# Patient Record
Sex: Female | Born: 1945
Health system: Southern US, Community
[De-identification: ages and names within clinical notes are randomized; demographics above are authoritative.]

## PROBLEM LIST (undated history)

## (undated) DIAGNOSIS — I272 Pulmonary hypertension, unspecified: Secondary | ICD-10-CM

## (undated) DIAGNOSIS — J449 Chronic obstructive pulmonary disease, unspecified: Secondary | ICD-10-CM

## (undated) DIAGNOSIS — B9620 Unspecified Escherichia coli [E. coli] as the cause of diseases classified elsewhere: Secondary | ICD-10-CM

## (undated) DIAGNOSIS — N12 Tubulo-interstitial nephritis, not specified as acute or chronic: Secondary | ICD-10-CM

## (undated) DIAGNOSIS — A419 Sepsis, unspecified organism: Secondary | ICD-10-CM

## (undated) DIAGNOSIS — F32A Depression, unspecified: Secondary | ICD-10-CM

## (undated) DIAGNOSIS — J189 Pneumonia, unspecified organism: Secondary | ICD-10-CM

## (undated) DIAGNOSIS — F329 Major depressive disorder, single episode, unspecified: Secondary | ICD-10-CM

## (undated) DIAGNOSIS — M1990 Unspecified osteoarthritis, unspecified site: Secondary | ICD-10-CM

## (undated) DIAGNOSIS — K219 Gastro-esophageal reflux disease without esophagitis: Secondary | ICD-10-CM

## (undated) HISTORY — PX: ELBOW SURGERY: SHX618

## (undated) HISTORY — PX: BACK SURGERY: SHX140

## (undated) HISTORY — PX: ABDOMINAL HYSTERECTOMY: SHX81

---

## 2000-11-27 ENCOUNTER — Encounter: Payer: Self-pay | Admitting: *Deleted

## 2000-11-27 ENCOUNTER — Ambulatory Visit (HOSPITAL_COMMUNITY): Admission: RE | Admit: 2000-11-27 | Discharge: 2000-11-27 | Payer: Self-pay | Admitting: *Deleted

## 2000-12-06 ENCOUNTER — Encounter: Payer: Self-pay | Admitting: *Deleted

## 2000-12-06 ENCOUNTER — Other Ambulatory Visit: Admission: RE | Admit: 2000-12-06 | Discharge: 2000-12-06 | Payer: Self-pay | Admitting: *Deleted

## 2000-12-06 ENCOUNTER — Ambulatory Visit (HOSPITAL_COMMUNITY): Admission: RE | Admit: 2000-12-06 | Discharge: 2000-12-06 | Payer: Self-pay | Admitting: *Deleted

## 2002-10-01 ENCOUNTER — Ambulatory Visit (HOSPITAL_COMMUNITY): Admission: RE | Admit: 2002-10-01 | Discharge: 2002-10-01 | Payer: Self-pay | Admitting: *Deleted

## 2002-10-01 ENCOUNTER — Encounter (INDEPENDENT_AMBULATORY_CARE_PROVIDER_SITE_OTHER): Payer: Self-pay | Admitting: *Deleted

## 2002-11-10 ENCOUNTER — Ambulatory Visit (HOSPITAL_COMMUNITY): Admission: EM | Admit: 2002-11-10 | Discharge: 2002-11-11 | Payer: Self-pay | Admitting: Emergency Medicine

## 2010-01-07 ENCOUNTER — Ambulatory Visit: Payer: Self-pay | Admitting: Internal Medicine

## 2010-01-07 DIAGNOSIS — K219 Gastro-esophageal reflux disease without esophagitis: Secondary | ICD-10-CM | POA: Insufficient documentation

## 2010-01-07 DIAGNOSIS — M545 Low back pain, unspecified: Secondary | ICD-10-CM | POA: Insufficient documentation

## 2010-01-07 DIAGNOSIS — G5620 Lesion of ulnar nerve, unspecified upper limb: Secondary | ICD-10-CM | POA: Insufficient documentation

## 2010-01-07 DIAGNOSIS — Z860100 Personal history of colon polyps, unspecified: Secondary | ICD-10-CM | POA: Insufficient documentation

## 2010-01-07 DIAGNOSIS — F17200 Nicotine dependence, unspecified, uncomplicated: Secondary | ICD-10-CM | POA: Insufficient documentation

## 2010-01-27 ENCOUNTER — Encounter: Payer: Self-pay | Admitting: Internal Medicine

## 2010-02-03 ENCOUNTER — Telehealth: Payer: Self-pay | Admitting: Internal Medicine

## 2010-03-05 ENCOUNTER — Emergency Department (HOSPITAL_COMMUNITY): Admission: EM | Admit: 2010-03-05 | Discharge: 2010-03-05 | Payer: Self-pay | Admitting: Emergency Medicine

## 2010-03-28 ENCOUNTER — Encounter (HOSPITAL_COMMUNITY): Admission: RE | Admit: 2010-03-28 | Discharge: 2010-04-27 | Payer: Self-pay | Admitting: Orthopedic Surgery

## 2010-08-16 NOTE — Progress Notes (Signed)
     Follow-up for Phone Call       Follow-up by: Etta Grandchild MD,  February 03, 2010 9:25 AM

## 2010-08-16 NOTE — Assessment & Plan Note (Signed)
Summary: NEW [employer] L HAND RING/SMALL FINGERS -NUMBNESS-#-PKG/OFF-STC   Vital Signs:  Patient profile:   65 year old female Height:      68 inches Weight:      178.25 pounds BMI:     27.20 O2 Sat:      94 % on Room air Temp:     98.1 degrees F oral Pulse rate:   90 / minute Pulse rhythm:   regular Resp:     16 per minute BP sitting:   110 / 68  (left arm) Cuff size:   large  Vitals Entered By: Rock Nephew CMA (January 07, 2010 1:13 PM)  Nutrition Counseling: Patient's BMI is greater than 25 and therefore counseled on weight management options.  O2 Flow:  Room air CC: Pt wants to establish a PCP and discuss L side finger numbness and tingling Is Patient Diabetic? No Pain Assessment Patient in pain? no        Primary Care Provider:  Etta Grandchild MD  CC:  Pt wants to establish a PCP and discuss L side finger numbness and tingling.  History of Present Illness: New to me she complains of the gradual onset of numbness and tingling in her left hand only in the 4th and 5th fingers. She has repetitive activity with sewing but no trauma or injury. The symptoms are most severe at night.  Preventive Screening-Counseling & Management  Alcohol-Tobacco     Smoking Status: current     Smoking Cessation Counseling: yes     Smoke Cessation Stage: precontemplative     Packs/Day: 1.0     Year Started: 1970     Pack years: 40     Tobacco Counseling: to quit use of tobacco products  Caffeine-Diet-Exercise     Does Patient Exercise: no  Hep-HIV-STD-Contraception     Hepatitis Risk: no risk noted     HIV Risk: no risk noted     STD Risk: no risk noted  Safety-Violence-Falls     Seat Belt Use: yes     Helmet Use: yes     Firearms in the Home: no firearms in the home     Smoke Detectors: yes     Violence in the Home: no risk noted     Sexual Abuse: no      Sexual History:  currently monogamous.        Drug Use:  never and no.        Blood Transfusions:  no.    Current  Medications (verified): 1)  None  Allergies (verified): No Known Drug Allergies  Past History:  Past Medical History: Colonic polyps, hx of GERD Low back pain  Past Surgical History: Hysterectomy Lumbar laminectomy  Family History: Family History Diabetes 1st degree relative  Social History: Retired Married Current Smoker Alcohol use-no Drug use-no Regular exercise-no Smoking Status:  current Drug Use:  never, no Does Patient Exercise:  no Packs/Day:  1.0 Hepatitis Risk:  no risk noted HIV Risk:  no risk noted STD Risk:  no risk noted Seat Belt Use:  yes Sexual History:  currently monogamous Blood Transfusions:  no  Review of Systems  The patient denies anorexia, fever, weight loss, chest pain, syncope, dyspnea on exertion, peripheral edema, prolonged cough, headaches, hemoptysis, abdominal pain, hematuria, suspicious skin lesions, transient blindness, and enlarged lymph nodes.    Physical Exam  General:  alert, well-developed, well-nourished, and well-hydrated.   Head:  normocephalic, atraumatic, no abnormalities observed, and no abnormalities palpated.  Eyes:  vision grossly intact, pupils equal, pupils round, no injection, and no nystagmus.   Mouth:  Oral mucosa and oropharynx without lesions or exudates.  Teeth in good repair. Neck:  supple, full ROM, no masses, no thyromegaly, no thyroid nodules or tenderness, no JVD, normal carotid upstroke, no carotid bruits, no cervical lymphadenopathy, and no neck tenderness.   Lungs:  normal respiratory effort, no intercostal retractions, no accessory muscle use, normal breath sounds, no dullness, no fremitus, no crackles, and no wheezes.   Heart:  normal rate, regular rhythm, no murmur, no gallop, no rub, and no JVD.   Abdomen:  soft, non-tender, normal bowel sounds, no distention, no masses, no guarding, no rigidity, no rebound tenderness, no abdominal hernia, no inguinal hernia, no hepatomegaly, and no splenomegaly.     Msk:  normal ROM, no joint tenderness, no joint swelling, no joint warmth, no redness over joints, no joint deformities, no joint instability, no crepitation, and no muscle atrophy.  negative Phalen's and Tinel's test bilaterally. Pulses:  R and L carotid,radial,femoral,dorsalis pedis and posterior tibial pulses are full and equal bilaterally Extremities:  No clubbing, cyanosis, edema, or deformity noted with normal full range of motion of all joints.   Neurologic:  alert & oriented X3, cranial nerves II-XII intact, strength normal in all extremities, gait normal, DTRs symmetrical and normal, finger-to-nose normal, heel-to-shin normal, and LUE sensory loss.     Impression & Recommendations:  Problem # 1:  ULNAR NEUROPATHY, LEFT (ICD-354.2) Assessment New  Orders: Neurology Referral (Neuro) Splints- All Types (Z3086)  Problem # 2:  TOBACCO USE (ICD-305.1) Assessment: New  Encouraged smoking cessation and discussed different methods for smoking cessation.   Patient Instructions: 1)  Please schedule a follow-up appointment in 1 month. 2)  Tobacco is very bad for your health and your loved ones! You Should stop smoking!. 3)  Stop Smoking Tips: Choose a Quit date. Cut down before the Quit date. decide what you will do as a substitute when you feel the urge to smoke(gum,toothpick,exercise).

## 2010-12-02 NOTE — H&P (Signed)
NAME:  CANDISE, CRABTREE                            ACCOUNT NO.:  [id number]   MEDICAL RECORD NO.:  [id number]                   PATIENT TYPE:  INP   LOCATION:  1829                                 FACILITY:  [facility]   PHYSICIAN:  Meade Maw, M.D.                 DATE OF BIRTH:  06-11-1946   DATE OF ADMISSION:  11/10/2002  DATE OF DISCHARGE:                                HISTORY & PHYSICAL   PRIMARY CARE Wiley Flicker:  Duncan Dull, M.D.   IMPRESSION:  (As dictated by Meade Maw, M.D.)  1. Chest pain with typical/atypical features in a 65 year old obese female     with a history of tobacco abuse and unknown cholesterol profile.  No     change in discomfort after three sublingual sprays.  Relieved by IV     morphine.  Her EKG is nonischemic and unchanged from baseline.  The first     set of cardiac enzymes were negative.  A recent pharmacologic stress test     was negative for ischemia.  The patient was pain-free in the emergency     room, now with mild recurrence.  2. History of premature ventricular contractions.  3. Tobacco abuse.  4. Mild hypokalemia which was supplemented.   PLAN:  (As dictated by Meade Maw, M.D.)  1. Cardiac catheterization and possible percutaneous intervention if     indicated and able.  The risks, potential complications, benefits, and     alternatives to the procedure were discussed in detail.  The patient, her     husband, and her sons indicate that their questions have been     satisfactory addressed and agree to proceed.  2. Serial cardiac enzymes and daily EKG.  3. IV nitroglycerin and IV heparin.  Continue aspirin and beta blocker.  4. Tobacco cessation consult.   HISTORY OF PRESENT ILLNESS:  The patient is a pleasant 65 year old with a  history of dyspnea, PVCs, and tobacco abuse.  She is status post recent  cardiology evaluation and subsequent pharmacologic stress test which was  negative for ischemia with normal ejection fraction.   This  morning this patient awoke feeling malaise and mild right lower sternal  chest hurting.  She felt nauseous all morning.  The discomfort intensified  by 11:30 a.m. to an 8/10 on pain scale associated with shortness of breath,  diaphoresis, and some radiation to her right neck.  EMS was summoned and  they administered three sublingual nitroglycerin sprays without change in  discomfort.  IV morphine with relief.  In the ER, the patient had mild  recurrent discomfort.  The EKG was nonischemic.  The first set of cardiac  enzymes was negative.  She feels slightly better if sitting up.  There was  mild reproduction of discomfort to applied pressure.  No change with deep  breath or cough.   PREVIOUS MEDICAL HISTORY:  1. Palpitations; history of documented PVCs for which she started Toprol     approximately three days earlier.  2. History of dyspnea.     a. Adenosine Cardiolite in April of 2004 was negative for ischemia.  EF        of 64%.  3. Tobacco abuse.  4. Anxiety with depression, improved on Zoloft.  5. History of fibromyalgia.  6. History of plantar fasciitis.  7. GERD on Protonix, well controlled.   Denies history of stroke, diabetes mellitus, and thyroid disease.   PREVIOUS SURGICAL HISTORY:  1. Hysterectomy without BSO.  2. Back surgery in 1989.  3. Repair of rupture at L4-5.   SOCIAL HISTORY AND HABITS:  Tobacco use of one pack per day for 36 years.  ETOH negative.  The patient has been married for 24 years.  She has two sons  alive and well.  She works as a Associate Professor.   FAMILY HISTORY:  Mom died at age 51.  She had diabetes and congestive heart  failure.  Her father died at age 79 of lung disease and blood disease.  Her grandfather had a late in life MI.  She has two sisters and one brother,  alive and well.   ALLERGIES:  SULFA causing nausea.  She is okay with seafood, shellfish, and  iodinated products.   MEDICATIONS:  1. Protonix 40 mg p.o. daily.  2. Aspirin 81  mg p.o. daily.  3. Multivitamin per day.  4. Calcium supplements.  5. Tylenol P.M. p.r.n.  6. Zoloft one-quarter of a 50 mg tablet p.o. daily.  7. Toprol XL 50 mg p.o. daily.  8. Volteran 75 mg p.r.n.  She did take a dose before admission.   REVIEW OF SYSTEMS:  As in HPI/previous medical history.  She wears eye  glasses.  Good dentition.  Hearing okay.  Negative dysphagia to food or  fluid.  History of GERD well controlled on Protonix.  More of a burning and  sour taste in the mouth.  Arthritis in her fingers and hips.  She has  general muscular aches and pains.  Denies pedal edema nor orthopnea.  Some  episodic lower extremity swelling after being on her feet all day.   PHYSICAL EXAMINATION:  (As performed by Meade Maw, M.D.)  VITAL SIGNS:  The blood pressure is 120/63, heart rate 74 and regular,  respiratory rate 22, O2 saturation 94% on room air, and she is afebrile.  GENERAL APPEARANCE:  She is a well-nourished, pleasantly conversant, 55-year-  old female looking older than her stated age.  Her husband and two sons are  in attendance.  NECK:  There are bilateral carotid upstrokes without bruit.  No significant  JVD.  No thyromegaly.  CHEST:  Bibasilar soft crackles.  CARDIAC:  Regular rate and rhythm without murmur, rub, or gallop.  Normal S1  and S2.  ABDOMEN:  Obese.  Normoactive bowel sounds.  Negative bruits.  Nontender to  applied pressure.  No organomegaly appreciated.  EXTREMITIES:  Distal pulses intact.  Negative pedal edema.  NEUROLOGIC:  Grossly nonfocal.  Alert and oriented x 3.    LABORATORY TESTS AND DATA:  Pro time 12.7, INR 0.9, PTT 29.  Sodium 140, K  3.4, chloride 108, BUN 14, creatinine 0.9, glucose 92.  Hemoglobin 16,  hematocrit 48.  LFTs within normal range.  The EKG revealed NSR at 65 beats  per minute, T-wave abnormalities in aVL, and no ischemic changes.  The chest x-ray is pending.  The first CK was 150 with an MB of 3.5 and troponin I  less than  0.01.     Salomon Fick, N.P.                       Meade Maw, M.D.    MES/MEDQ  D:  11/10/2002  T:  11/10/2002  Job:  [id number]   cc:   Duncan Dull, M.D.  [address]  [place]  [place] [address]  Fax: [phone]

## 2010-12-02 NOTE — Cardiovascular Report (Signed)
NAME:  Kayla Sawyer, Kayla Sawyer                            ACCOUNT NO.:  [id number]   MEDICAL RECORD NO.:  [id number]                   PATIENT TYPE:  INP   LOCATION:  4731                                 FACILITY:  [facility]   PHYSICIAN:  Meade Maw, M.D.                 DATE OF BIRTH:  03/13/1946   DATE OF PROCEDURE:  DATE OF DISCHARGE:  11/11/2002                              CARDIAC CATHETERIZATION   INDICATIONS FOR PROCEDURE:  Ongoing chest pain in a 65 year old female with  significant risk factors for coronary artery disease. Recent pharmacological  Cardiolite was negative for inducible ischemia; however, the patient has had  ongoing severe chest pain requiring ER visits.   PROCEDURE:  After obtaining written and informed consent, the patient was  brought to the cardiac catheterization lab in a __________ state,  preoperative sedation was achieved using IV Versed. The right groin was  prepped and draped in the usual sterile fashion. Local anesthesia was  achieved using 1% Xylocaine. A 6-French hemostasis sheath was placed into  the right femoral artery using a modified Seldinger's technique. Selective  coronary angiography was performed using a JL4 JR4 Judkins catheter.  Multiple views were obtained. Catheter exchange was made over her guidewire.  Single plane ventriculogram was performed in the RAO position using a 6-  French pigtail curved catheter. Following the procedure, there was no  identifiable disease. The patient was transferred to the holding area.  Hemostasis was achieved using digital pressure. There was no early  complications.   FINDINGS:  The aortic pressure was 103/54, LV pressure was 98/3. There was  no gradient noted on pullback. Single plane ventriculogram reveals mild  hypokinesis with estimated ejection fraction of 50 to 55%.   Coronary angiography:  Left main coronary artery is a short artery with  bifurcation to the left anterior descending and a circumflex  vessel. There  is no significant disease in the left main coronary artery.   Left anterior descending:  Left anterior descending gives rise to a small  D1, larger D2, small D3, goes on to end as an apical branch. There is no  significant disease in the left anterior descending or its branches.   Circumflex vessel. Circumflex vessel is a moderate sized vessel given rise  to a trivial OM1, moderate OM2, large OM3, and then goes on to end as an AV  groove vessel. There is no disease in the circumflex or its branches.   Right coronary artery. The right coronary artery is dominant for the  posterior circulation and gives rise to RV marginals, PDA and PL branch.  There is disease in the right coronary artery or its branches.   FINAL IMPRESSION:  1. Luminal irregularities only.  2. Normal ventriculogram, ejection fraction is 50 to 55% with mitral     regurgitation.   RECOMMENDATIONS:  Consider other etiologies such as esophageal spasm or  gastritis  for ongoing chest pain. Gastrointestinal workup as recommended, if  this has not been previously performed.                                               Meade Maw, M.D.    HP/MEDQ  D:  11/11/2002  T:  11/12/2002  Job:  [id number]

## 2010-12-09 ENCOUNTER — Other Ambulatory Visit (HOSPITAL_COMMUNITY): Payer: Self-pay | Admitting: Internal Medicine

## 2010-12-09 DIAGNOSIS — R0989 Other specified symptoms and signs involving the circulatory and respiratory systems: Secondary | ICD-10-CM

## 2010-12-15 ENCOUNTER — Ambulatory Visit (HOSPITAL_COMMUNITY)
Admission: RE | Admit: 2010-12-15 | Discharge: 2010-12-15 | Disposition: A | Discharge status: Home / Self Care | Payer: BC Managed Care – PPO | Source: Ambulatory Visit | Attending: Internal Medicine | Admitting: Internal Medicine

## 2010-12-15 DIAGNOSIS — I6529 Occlusion and stenosis of unspecified carotid artery: Secondary | ICD-10-CM | POA: Insufficient documentation

## 2010-12-15 DIAGNOSIS — R0989 Other specified symptoms and signs involving the circulatory and respiratory systems: Secondary | ICD-10-CM | POA: Insufficient documentation

## 2010-12-15 DIAGNOSIS — F17200 Nicotine dependence, unspecified, uncomplicated: Secondary | ICD-10-CM | POA: Insufficient documentation

## 2011-07-07 ENCOUNTER — Emergency Department (HOSPITAL_COMMUNITY): Payer: Self-pay

## 2011-07-07 ENCOUNTER — Encounter: Payer: Self-pay | Admitting: Emergency Medicine

## 2011-07-07 ENCOUNTER — Other Ambulatory Visit: Payer: Self-pay

## 2011-07-07 ENCOUNTER — Inpatient Hospital Stay (HOSPITAL_COMMUNITY)
Admission: EM | Admit: 2011-07-07 | Discharge: 2011-07-10 | DRG: 192 | Disposition: A | Discharge status: Home / Self Care | Payer: Self-pay | Attending: Internal Medicine | Admitting: Internal Medicine

## 2011-07-07 DIAGNOSIS — J449 Chronic obstructive pulmonary disease, unspecified: Secondary | ICD-10-CM

## 2011-07-07 DIAGNOSIS — F17200 Nicotine dependence, unspecified, uncomplicated: Secondary | ICD-10-CM | POA: Diagnosis present

## 2011-07-07 DIAGNOSIS — J392 Other diseases of pharynx: Secondary | ICD-10-CM | POA: Diagnosis present

## 2011-07-07 DIAGNOSIS — J441 Chronic obstructive pulmonary disease with (acute) exacerbation: Principal | ICD-10-CM | POA: Diagnosis present

## 2011-07-07 DIAGNOSIS — R51 Headache: Secondary | ICD-10-CM | POA: Diagnosis present

## 2011-07-07 DIAGNOSIS — Z23 Encounter for immunization: Secondary | ICD-10-CM

## 2011-07-07 DIAGNOSIS — R0902 Hypoxemia: Secondary | ICD-10-CM | POA: Diagnosis present

## 2011-07-07 DIAGNOSIS — I4949 Other premature depolarization: Secondary | ICD-10-CM | POA: Diagnosis present

## 2011-07-07 LAB — CARDIAC PANEL(CRET KIN+CKTOT+MB+TROPI)
CK, MB: 4 ng/mL (ref 0.3–4.0)
Relative Index: 3.2 — ABNORMAL HIGH (ref 0.0–2.5)
Total CK: 126 U/L (ref 7–177)
Troponin I: <0.3 ng/mL (ref <0.30)

## 2011-07-07 LAB — POCT I-STAT, CHEM 8
BUN: 11 mg/dL (ref 6–23)
Calcium, Ion: 1.11 mmol/L — ABNORMAL LOW (ref 1.12–1.32)
Chloride: 99 mEq/L (ref 96–112)
Creatinine, Ser: 0.7 mg/dL (ref 0.50–1.10)
Glucose, Bld: 115 mg/dL — ABNORMAL HIGH (ref 70–99)
HCT: 47 % — ABNORMAL HIGH (ref 36.0–46.0)
Hemoglobin: 16 g/dL — ABNORMAL HIGH (ref 12.0–15.0)
Potassium: 3.5 mEq/L (ref 3.5–5.1)
Sodium: 137 mEq/L (ref 135–145)
TCO2: 26 mmol/L (ref 0–100)

## 2011-07-07 LAB — INFLUENZA PANEL BY PCR (TYPE A & B)
H1N1 flu by pcr: NOT DETECTED
Influenza A By PCR: NEGATIVE
Influenza B By PCR: NEGATIVE

## 2011-07-07 MED ORDER — HEPARIN SODIUM (PORCINE) 5000 UNIT/ML IJ SOLN
5000.0000 [IU] | Freq: Three times a day (TID) | INTRAMUSCULAR | Status: DC
  Administered 2011-07-07 – 2011-07-10 (×9): 5000 [IU] via SUBCUTANEOUS
  Filled 2011-07-07 (×15): qty 1

## 2011-07-07 MED ORDER — GADOBENATE DIMEGLUMINE 529 MG/ML IV SOLN
15.0000 mL | Freq: Once | INTRAVENOUS | Status: AC | PRN
  Administered 2011-07-07: 15 mL via INTRAVENOUS

## 2011-07-07 MED ORDER — IPRATROPIUM BROMIDE 0.02 % IN SOLN
0.5000 mg | RESPIRATORY_TRACT | Status: DC | PRN
  Administered 2011-07-07: 0.5 mg via RESPIRATORY_TRACT
  Filled 2011-07-07: qty 2.5

## 2011-07-07 MED ORDER — HYDROMORPHONE HCL PF 1 MG/ML IJ SOLN
1.0000 mg | INTRAMUSCULAR | Status: DC | PRN
  Filled 2011-07-07: qty 1

## 2011-07-07 MED ORDER — ALBUTEROL SULFATE (5 MG/ML) 0.5% IN NEBU
2.5000 mg | INHALATION_SOLUTION | Freq: Once | RESPIRATORY_TRACT | Status: AC
  Administered 2011-07-07: 2.5 mg via RESPIRATORY_TRACT
  Filled 2011-07-07: qty 0.5

## 2011-07-07 MED ORDER — IPRATROPIUM BROMIDE 0.02 % IN SOLN
0.5000 mg | Freq: Once | RESPIRATORY_TRACT | Status: AC
  Administered 2011-07-07: 0.5 mg via RESPIRATORY_TRACT
  Filled 2011-07-07: qty 2.5

## 2011-07-07 MED ORDER — ALBUTEROL SULFATE (5 MG/ML) 0.5% IN NEBU
2.5000 mg | INHALATION_SOLUTION | Freq: Three times a day (TID) | RESPIRATORY_TRACT | Status: DC
  Administered 2011-07-08 – 2011-07-10 (×8): 2.5 mg via RESPIRATORY_TRACT
  Filled 2011-07-07 (×8): qty 0.5

## 2011-07-07 MED ORDER — IOHEXOL 300 MG/ML  SOLN
80.0000 mL | Freq: Once | INTRAMUSCULAR | Status: AC | PRN
  Administered 2011-07-07: 80 mL via INTRAVENOUS

## 2011-07-07 MED ORDER — HYDROMORPHONE HCL PF 1 MG/ML IJ SOLN
1.0000 mg | Freq: Once | INTRAMUSCULAR | Status: AC
  Administered 2011-07-07: 1 mg via INTRAVENOUS
  Filled 2011-07-07: qty 1

## 2011-07-07 MED ORDER — HYDROMORPHONE HCL PF 1 MG/ML IJ SOLN
1.0000 mg | INTRAMUSCULAR | Status: DC | PRN
  Administered 2011-07-07: 1 mg via INTRAVENOUS
  Filled 2011-07-07: qty 1

## 2011-07-07 MED ORDER — ALBUTEROL SULFATE (5 MG/ML) 0.5% IN NEBU
5.0000 mg | INHALATION_SOLUTION | Freq: Four times a day (QID) | RESPIRATORY_TRACT | Status: DC
  Administered 2011-07-07: 5 mg via RESPIRATORY_TRACT
  Filled 2011-07-07 (×2): qty 0.5
  Filled 2011-07-07: qty 1

## 2011-07-07 MED ORDER — IPRATROPIUM BROMIDE 0.02 % IN SOLN
0.5000 mg | Freq: Three times a day (TID) | RESPIRATORY_TRACT | Status: DC
  Administered 2011-07-08 – 2011-07-10 (×9): 0.5 mg via RESPIRATORY_TRACT
  Filled 2011-07-07 (×8): qty 2.5

## 2011-07-07 MED ORDER — PREDNISONE 20 MG PO TABS
60.0000 mg | ORAL_TABLET | Freq: Once | ORAL | Status: AC
  Administered 2011-07-07: 60 mg via ORAL
  Filled 2011-07-07: qty 3

## 2011-07-07 MED ORDER — PNEUMOCOCCAL VAC POLYVALENT 25 MCG/0.5ML IJ INJ
0.5000 mL | INJECTION | INTRAMUSCULAR | Status: AC
  Administered 2011-07-08: 0.5 mL via INTRAMUSCULAR
  Filled 2011-07-07: qty 0.5

## 2011-07-07 MED ORDER — IBUPROFEN 400 MG PO TABS: 200.0000 mg | ORAL_TABLET | Freq: Four times a day (QID) | ORAL | Status: DC | PRN

## 2011-07-07 MED ORDER — ONDANSETRON HCL 4 MG/2ML IJ SOLN
4.0000 mg | Freq: Three times a day (TID) | INTRAMUSCULAR | Status: DC | PRN
  Administered 2011-07-07: 4 mg via INTRAVENOUS
  Filled 2011-07-07: qty 2

## 2011-07-07 MED ORDER — METHYLPREDNISOLONE SODIUM SUCC 125 MG IJ SOLR
60.0000 mg | Freq: Four times a day (QID) | INTRAMUSCULAR | Status: DC
  Administered 2011-07-07 – 2011-07-10 (×11): 60 mg via INTRAVENOUS
  Filled 2011-07-07 (×11): qty 2

## 2011-07-07 MED ORDER — MOXIFLOXACIN HCL IN NACL 400 MG/250ML IV SOLN
400.0000 mg | INTRAVENOUS | Status: DC
  Administered 2011-07-07 – 2011-07-09 (×3): 400 mg via INTRAVENOUS
  Filled 2011-07-07 (×5): qty 250

## 2011-07-07 MED ORDER — OXYCODONE-ACETAMINOPHEN 5-325 MG PO TABS
1.0000 | ORAL_TABLET | ORAL | Status: DC | PRN
  Administered 2011-07-07: 1 via ORAL
  Filled 2011-07-07: qty 1

## 2011-07-07 MED ORDER — KETOROLAC TROMETHAMINE 30 MG/ML IJ SOLN
30.0000 mg | Freq: Once | INTRAMUSCULAR | Status: AC
  Administered 2011-07-07: 30 mg via INTRAVENOUS
  Filled 2011-07-07: qty 1

## 2011-07-07 MED ORDER — ONDANSETRON HCL 4 MG/2ML IJ SOLN
4.0000 mg | Freq: Once | INTRAMUSCULAR | Status: AC
  Administered 2011-07-07: 4 mg via INTRAVENOUS
  Filled 2011-07-07: qty 2

## 2011-07-07 MED ORDER — INFLUENZA VIRUS VACC SPLIT PF IM SUSP
0.5000 mL | INTRAMUSCULAR | Status: AC
  Administered 2011-07-08: 0.5 mL via INTRAMUSCULAR
  Filled 2011-07-07: qty 0.5

## 2011-07-07 MED ORDER — ALBUTEROL SULFATE (5 MG/ML) 0.5% IN NEBU: 2.5000 mg | INHALATION_SOLUTION | Freq: Four times a day (QID) | RESPIRATORY_TRACT | Status: DC

## 2011-07-07 MED ORDER — ALBUTEROL SULFATE (5 MG/ML) 0.5% IN NEBU: 2.5000 mg | INHALATION_SOLUTION | RESPIRATORY_TRACT | Status: DC | PRN

## 2011-07-07 NOTE — ED Notes (Signed)
Pt ambulated around nurses station o2 sat dropped to 86% and EDP notified.

## 2011-07-07 NOTE — ED Provider Notes (Signed)
History   This chart was scribed for Nelia Shi, MD scribed by Magnus Sinning. The patient was seen in room APA01/APA01    CSN: [id number]  Arrival date & time 07/07/11  2956   First MD Initiated Contact with Patient 07/07/11 1057      Chief Complaint  Patient presents with  . Headache  . Diarrhea  . Shortness of Breath    (Consider location/radiation/quality/duration/timing/severity/associated sxs/prior treatment) HPI Kayla Sawyer is a 65 y.o. female who presents to the Emergency Department complaining of an intermittent sharp head pain, with onset being two days ago w/ associated dyspnea, cough, congestion,sneezing, mild diarrhea, fever, and inability to eat. She describes her CC as a "shooting pain located behind left ear" and reports that she would "not have come to the ED if she was not having this head pain." She denies vomiting and reports that it is not worsened with movement. Pt states that she took two ibuprofen this morning with no improvement. Pt reports having a hx of migraines and her last physical in May.  PCP: Dr. Margo Aye History reviewed. No pertinent past medical history.  Past Surgical History  Procedure Date  . Elbow surgery     History reviewed. No pertinent family history.  History  Substance Use Topics  . Smoking status: Current Everyday Smoker -- 1.0 packs/day    Types: Cigarettes  . Smokeless tobacco: Not on file  . Alcohol Use: No    Review of Systems  Constitutional: Positive for fever and appetite change (Inability to eat).  HENT: Positive for congestion and sneezing.   Respiratory: Positive for cough.        Difficulty breathing  Gastrointestinal: Positive for diarrhea. Negative for vomiting.  Neurological: Positive for headaches.  All other systems reviewed and are negative.    Allergies  Review of patient's allergies indicates no known allergies.  Home Medications   Current Outpatient Rx  Name Route Sig Dispense Refill  .  IBUPROFEN 200 MG PO TABS Oral Take 200 mg by mouth every 6 (six) hours as needed. For pain       BP 117/88  Pulse 106  Temp(Src) 98.3 F (36.8 C) (Oral)  Resp 22  Ht 5\' 9"  (1.753 m)  Wt 170 lb (77.111 kg)  BMI 25.10 kg/m2  SpO2 96%  Physical Exam  Nursing note and vitals reviewed. Constitutional: She is oriented to person, place, and time. She appears well-developed and well-nourished. No distress.  HENT:  Head: Normocephalic and atraumatic.  Eyes: Pupils are equal, round, and reactive to light.  Neck: Normal range of motion.       Point tenderness on the trapezius on the left side  Cardiovascular: Normal rate, regular rhythm, normal heart sounds and intact distal pulses.   Pulmonary/Chest:       Course rhonchi bilaterally   Abdominal: Normal appearance. She exhibits no distension.  Musculoskeletal: Normal range of motion.  Neurological: She is alert and oriented to person, place, and time. No cranial nerve deficit.  Skin: Skin is warm and dry. No rash noted.  Psychiatric: She has a normal mood and affect. Her behavior is normal.    ED Course  Procedures (including critical care time) DIAGNOSTIC STUDIES: Oxygen Saturation is 89% on room air, low by my interpretation.   Patient's condition continued to show that she was hypoxic on room air and would desaturate when we take her off the oxygen.  I elected to admit the patient because of her persistent hypoxia.  The hospitalist was contacted and will come see the patient in the ED. COORDINATION OF CARE:  Labs Reviewed  POCT I-STAT, CHEM 8 - Abnormal; Notable for the following:    Glucose, Bld 115 (*)    Calcium, Ion 1.11 (*)    Hemoglobin 16.0 (*)    HCT 47.0 (*)    All other components within normal limits  I-STAT, CHEM 8  I-STAT TROPONIN I   Dg Chest 2 View  07/07/2011  *RADIOLOGY REPORT*  Clinical Data: Cough and congestion.  Headache  CHEST - 2 VIEW  Comparison: None.  Findings: COPD with pulmonary hyperinflation.   Negative for heart failure or pneumonia.  Possible 1 cm nodule right lung base.  This could be due to overlapping prominent lung markings or possibly a nipple shadow.  IMPRESSION: COPD with hyperinflation.  Possible right lower lobe nodule.  Repeat PA chest x-ray with nipple marker is suggested.  Original Report Authenticated By: Camelia Phenes, M.D.   Ct Head Wo Contrast  07/07/2011  *RADIOLOGY REPORT*  Clinical Data: Left-sided weakness.  CT HEAD WITHOUT CONTRAST  Technique:  Contiguous axial images were obtained from the base of the skull through the vertex without contrast.  Comparison: None  Findings: Minimal patchy chronic small vessel disease in the deep white matter. No acute intracranial abnormality.  Specifically, no hemorrhage, hydrocephalus, mass lesion, acute infarction, or significant intracranial injury.  No acute calvarial abnormality. Mucosal thickening in the ethmoid air cells.  Air-fluid level in the left sphenoid sinus.  Mastoids are clear.  IMPRESSION: Minimal chronic small vessel disease.  No acute intracranial abnormality.  Original Report Authenticated By: Cyndie Chime, M.D.   Ct Chest W Contrast  07/07/2011  *RADIOLOGY REPORT*  Clinical Data: Cough, congestion.  Evaluate nodule.  CT CHEST WITH CONTRAST  Technique:  Multidetector CT imaging of the chest was performed following the standard protocol during bolus administration of intravenous contrast.  Contrast: 80mL OMNIPAQUE IOHEXOL 300 MG/ML IV SOLN  Comparison: Chest x-ray 07/07/2011  Findings: There are no suspicious pulmonary nodules within the lungs, in particular in the right lower lobe as questioned on chest x-ray.  This likely represented overlapping shadows.  Mild COPD changes are present.  There is mild peribronchial thickening. Scarring in the right lung base.  No confluent opacities or effusions.  Heart is upper limits normal in size.  Coronary artery and aortic calcifications.  No aortic aneurysm.  There are mildly  prominent mediastinal lymph nodes.  Index right paratracheal node measures 11 mm in short axis diameter. Subcarinal lymph node has a short axis diameter of 9 mm.  No hilar or axillary adenopathy.  Esophagus is fluid-filled which may be related to dysmotility or reflux. Visualized thyroid and chest wall soft tissues unremarkable. Imaging into the upper abdomen shows no acute findings.  Proximal superior mesenteric artery is severely calcified and possibly occluded or nearly occluded.  Visualized upper abdomen demonstrates irregular plaque along the posterior wall with diffuse heavy calcifications.  IMPRESSION: Moderate COPD.  No suspicious pulmonary nodules in the lungs.  Previously questioned density on chest x-ray likely represented overlapping shadows.  Coronary artery disease.  Borderline sized mediastinal lymph nodes.  Fluid-filled esophagus which may be related to dysmotility or reflux.  Heavily calcified upper abdominal aorta and branch vessels. Superior mesenteric artery is heavily calcified and may be occluded or nearly occluded proximally.  Original Report Authenticated By: Cyndie Chime, M.D.   Dg Chest Special View  07/07/2011  *RADIOLOGY REPORT*  Clinical  Data: Question right lower lobe nodule  CHEST SPECIAL VIEW  Comparison: Same day  Findings: Film with nipple markers shows that the nipple shadows are not responsible for the nodular density in the right lower lobe.  Heart size is normal.  Hilar shadows are prominent, presumably due to prominent central pulmonary arteries.  No evidence of acute heart failure or effusion.  No acute bony finding.  Elsewhere, chronic interstitial lung disease and bronchial thickening remain evident.  IMPRESSION: Density in the right lower lobe is not due to a nipple shadow.  Chronic appearing interstitial lung markings.  Bronchial thickening suggesting bronchitis.  If the clinical presentation is consistent with bronchitis, this nodule could be reevaluated after  treatment.  Alternatively, one could perform chest CT today. Which  ever is chosen, I would definitely suggest some sort of follow-up.  Original Report Authenticated By: Thomasenia Sales, M.D.     1. Hypoxemia   2. COPD (chronic obstructive pulmonary disease)   3. Headache       MDM        I personally performed the services described in this documentation, which was scribed in my presence. The recorded information has been reviewed and considered.   Nelia Shi, MD 07/07/11 [phone]

## 2011-07-07 NOTE — H&P (Signed)
PCP:   Sanda Linger, MD, MD   Chief Complaint:  Pain behind my left ear for one day HPI: 65 year old female with history of ongoing tobacco abuse, copd not on home oxygen , she was on her regular state of health when last night start to have sudden onset of sever pain behind her left ear , no neck stiffness , no ear pain ,no loss of hearing, her condition associated with runny nose and congestion, cough non productive , patient denies any similar condition, no vomiting ,no nausea ,no chills or fever , on the ER has ct head, which was negative, also found to be hypoxic to low 80th , although no significant history of shortness of breath , denies any numbness or weakness of her upper or lower extrenities  Review of Systems:  The patient denies anorexia, fever, weight loss,, vision loss, decreased hearing, hoarseness, chest pain, syncope, dyspnea on exertion, peripheral edema, balance deficits, hemoptysis, abdominal pain, melena, hematochezia, severe indigestion/heartburn, hematuria, incontinence, genital sores, muscle weakness, suspicious skin lesions, transient blindness, difficulty walking, depression, unusual weight change, abnormal bleeding, enlarged lymph nodes, angioedema, and breast masses.  Past Medical History: History reviewed. No pertinent past medical history. Past Surgical History  Procedure Date  . Elbow surgery   Back surgery  Medications: Prior to Admission medications   Medication Sig Start Date End Date Taking? Authorizing Provider  ibuprofen (ADVIL,MOTRIN) 200 MG tablet Take 200 mg by mouth every 6 (six) hours as needed. For pain    Yes Historical Provider, MD    Allergies:  No Known Allergies  Social History:  reports that she has been smoking Cigarettes.  She has been smoking about 1 pack per day. She does not have any smokeless tobacco history on file. She reports that she does not drink alcohol or use illicit drugs. she is married ,lives with her husband,has 2 sons  grown  Family History: History reviewed. No pertinent family history.  Physical Exam: Filed Vitals:   07/07/11 1227 07/07/11 1230 07/07/11 1435 07/07/11 1547  BP:    94/58  Pulse:  86 94 108  Temp: 99.1 F (37.3 C)   99.2 F (37.3 C)  TempSrc: Oral   Oral  Resp:   18   Height:      Weight:      SpO2:  94% 87% 82%   She is not on respiratory distress, she is not pale or jaundice; skull with no mass or erythema, she has tenderness at mastoid bone but no palpable mass, neck no stiffness, no LN, NO THYROMEGALY Heart: S1 and s2 , no added sounds Abdomen soft non tender, bowel sound present Extremities with no edema CNS fully oriented , no focal neurological deficit  Labs on Admission:   Basename 07/07/11 1133  NA 137  K 3.5  CL 99  CO2 --  GLUCOSE 115*  BUN 11  CREATININE 0.70  CALCIUM --  MG --  PHOS --   No results found for this basename: AST:2,ALT:2,ALKPHOS:2,BILITOT:2,PROT:2,ALBUMIN:2 in the last 72 hours No results found for this basename: LIPASE:2,AMYLASE:2 in the last 72 hours  Basename 07/07/11 1133  WBC --  NEUTROABS --  HGB 16.0*  HCT 47.0*  MCV --  PLT --   No results found for this basename: CKTOTAL:3,CKMB:3,CKMBINDEX:3,TROPONINI:3 in the last 72 hours No results found for this basename: TSH,T4TOTAL,FREET3,T3FREE,THYROIDAB in the last 72 hours No results found for this basename: VITAMINB12:2,FOLATE:2,FERRITIN:2,TIBC:2,IRON:2,RETICCTPCT:2 in the last 72 hours  Radiological Exams on Admission: Dg Chest  2 View  07/07/2011  *RADIOLOGY REPORT*  Clinical Data: Cough and congestion.  Headache  CHEST - 2 VIEW  Comparison: None.  Findings: COPD with pulmonary hyperinflation.  Negative for heart failure or pneumonia.  Possible 1 cm nodule right lung base.  This could be due to overlapping prominent lung markings or possibly a nipple shadow.  IMPRESSION: COPD with hyperinflation.  Possible right lower lobe nodule.  Repeat PA chest x-ray with nipple marker is  suggested.  Original Report Authenticated By: Camelia Phenes, M.D.   Ct Head Wo Contrast  07/07/2011  *RADIOLOGY REPORT*  Clinical Data: Left-sided weakness.  CT HEAD WITHOUT CONTRAST  Technique:  Contiguous axial images were obtained from the base of the skull through the vertex without contrast.  Comparison: None  Findings: Minimal patchy chronic small vessel disease in the deep white matter. No acute intracranial abnormality.  Specifically, no hemorrhage, hydrocephalus, mass lesion, acute infarction, or significant intracranial injury.  No acute calvarial abnormality. Mucosal thickening in the ethmoid air cells.  Air-fluid level in the left sphenoid sinus.  Mastoids are clear.  IMPRESSION: Minimal chronic small vessel disease.  No acute intracranial abnormality.  Original Report Authenticated By: Cyndie Chime, M.D.   Ct Chest W Contrast  07/07/2011  *RADIOLOGY REPORT*  Clinical Data: Cough, congestion.  Evaluate nodule.  CT CHEST WITH CONTRAST  Technique:  Multidetector CT imaging of the chest was performed following the standard protocol during bolus administration of intravenous contrast.  Contrast: 80mL OMNIPAQUE IOHEXOL 300 MG/ML IV SOLN  Comparison: Chest x-ray 07/07/2011  Findings: There are no suspicious pulmonary nodules within the lungs, in particular in the right lower lobe as questioned on chest x-ray.  This likely represented overlapping shadows.  Mild COPD changes are present.  There is mild peribronchial thickening. Scarring in the right lung base.  No confluent opacities or effusions.  Heart is upper limits normal in size.  Coronary artery and aortic calcifications.  No aortic aneurysm.  There are mildly prominent mediastinal lymph nodes.  Index right paratracheal node measures 11 mm in short axis diameter. Subcarinal lymph node has a short axis diameter of 9 mm.  No hilar or axillary adenopathy.  Esophagus is fluid-filled which may be related to dysmotility or reflux. Visualized thyroid  and chest wall soft tissues unremarkable. Imaging into the upper abdomen shows no acute findings.  Proximal superior mesenteric artery is severely calcified and possibly occluded or nearly occluded.  Visualized upper abdomen demonstrates irregular plaque along the posterior wall with diffuse heavy calcifications.  IMPRESSION: Moderate COPD.  No suspicious pulmonary nodules in the lungs.  Previously questioned density on chest x-ray likely represented overlapping shadows.  Coronary artery disease.  Borderline sized mediastinal lymph nodes.  Fluid-filled esophagus which may be related to dysmotility or reflux.  Heavily calcified upper abdominal aorta and branch vessels. Superior mesenteric artery is heavily calcified and may be occluded or nearly occluded proximally.  Original Report Authenticated By: Cyndie Chime, M.D.   Dg Chest Special View  07/07/2011  *RADIOLOGY REPORT*  Clinical Data: Question right lower lobe nodule  CHEST SPECIAL VIEW  Comparison: Same day  Findings: Film with nipple markers shows that the nipple shadows are not responsible for the nodular density in the right lower lobe.  Heart size is normal.  Hilar shadows are prominent, presumably due to prominent central pulmonary arteries.  No evidence of acute heart failure or effusion.  No acute bony finding.  Elsewhere, chronic interstitial lung disease and bronchial thickening  remain evident.  IMPRESSION: Density in the right lower lobe is not due to a nipple shadow.  Chronic appearing interstitial lung markings.  Bronchial thickening suggesting bronchitis.  If the clinical presentation is consistent with bronchitis, this nodule could be reevaluated after treatment.  Alternatively, one could perform chest CT today. Which  ever is chosen, I would definitely suggest some sort of follow-up.  Original Report Authenticated By: Thomasenia Sales, M.D.    Assessment/Plan 1- left side neck pain could be muscular or mastoiditis with  Will proceed with  MRIof brain and neck with contrast. Pain medication for the time being. 2-hypoxia , history of ongoing tobacco abuse, will check flu PCR, nebs treatment , Avalox , short term steroid. 3-copd seems stable 4-hypokalemia : replace Korrina Zern I. 07/07/2011, 4:37 PM

## 2011-07-07 NOTE — ED Notes (Signed)
Pt is in radiology area, informed family of intent to transfer pt to the floor upon her return to the tx room

## 2011-07-07 NOTE — ED Notes (Signed)
Pt c/o head pain that shoots up the back of her head. Pt c/o sob. Pt states it started out as a cold on Wed.

## 2011-07-07 NOTE — ED Notes (Signed)
Oxygen taken of pt at this time.

## 2011-07-08 LAB — CBC
HCT: 40.5 % (ref 36.0–46.0)
Hemoglobin: 13.7 g/dL (ref 12.0–15.0)
MCH: 31.3 pg (ref 26.0–34.0)
MCHC: 33.8 g/dL (ref 30.0–36.0)
MCV: 92.5 fL (ref 78.0–100.0)
Platelets: 173 10*3/uL (ref 150–400)
RBC: 4.38 MIL/uL (ref 3.87–5.11)
RDW: 13.8 % (ref 11.5–15.5)
WBC: 7.4 10*3/uL (ref 4.0–10.5)

## 2011-07-08 LAB — COMPREHENSIVE METABOLIC PANEL
ALT: 15 U/L (ref 0–35)
AST: 13 U/L (ref 0–37)
Albumin: 3 g/dL — ABNORMAL LOW (ref 3.5–5.2)
Alkaline Phosphatase: 58 U/L (ref 39–117)
BUN: 12 mg/dL (ref 6–23)
CO2: 28 mEq/L (ref 19–32)
Calcium: 9 mg/dL (ref 8.4–10.5)
Chloride: 100 mEq/L (ref 96–112)
Creatinine, Ser: 0.53 mg/dL (ref 0.50–1.10)
GFR calc Af Amer: >90 mL/min (ref >90)
GFR calc non Af Amer: >90 mL/min (ref >90)
Glucose, Bld: 151 mg/dL — ABNORMAL HIGH (ref 70–99)
Potassium: 4.1 mEq/L (ref 3.5–5.1)
Sodium: 136 mEq/L (ref 135–145)
Total Bilirubin: 0.2 mg/dL — ABNORMAL LOW (ref 0.3–1.2)
Total Protein: 6.6 g/dL (ref 6.0–8.3)

## 2011-07-08 LAB — POCT I-STAT TROPONIN I: Troponin i, poc: 0.01 ng/mL (ref 0.00–0.08)

## 2011-07-08 NOTE — Progress Notes (Signed)
Subjective: Left ear pain resolved, she still shortness of breath, denies any coughing, she still hypoxic, telemetry monitor with some bigeminy, denies any chest pain, no nausea or vomiting  Objective: Vital signs in last 24 hours: Filed Vitals:   07/08/11 1126 07/08/11 1134 07/08/11 1306 07/08/11 1407  BP:    120/58  Pulse:    87  Temp:    97.5 F (36.4 C)  TempSrc:      Resp:    20  Height:      Weight:      SpO2: 85% 94% 98% 90%   Weight change:   Intake/Output Summary (Last 24 hours) at 07/08/11 1427 Last data filed at 07/08/11 1300  Gross per 24 hour  Intake    610 ml  Output   2300 ml  Net  -1690 ml   Lying comfortably in bed, not on respiratory distress Teeth without cavities, mucosa moist Neck supple with no lymph nodes Heart S1 and S2 with no added sounds Lungs fine rales at the left lung bases no wheezing Extremities without edema Lab Results:  [facility] 07/08/11 0658 07/07/11 1133  NA 136 137  K 4.1 3.5  CL 100 99  CO2 28 --  GLUCOSE 151* 115*  BUN 12 11  CREATININE 0.53 0.70  CALCIUM 9.0 --  MG -- --  PHOS -- --    Basename 07/08/11 0658  AST 13  ALT 15  ALKPHOS 58  BILITOT 0.2*  PROT 6.6  ALBUMIN 3.0*   No results found for this basename: LIPASE:2,AMYLASE:2 in the last 72 hours  Basename 07/08/11 0658 07/07/11 1133  WBC 7.4 --  NEUTROABS -- --  HGB 13.7 16.0*  HCT 40.5 47.0*  MCV 92.5 --  PLT 173 --    Basename 07/07/11 1906  CKTOTAL 126  CKMB 4.0  CKMBINDEX --  TROPONINI <0.30   No components found with this basename: POCBNP:3 No results found for this basename: DDIMER:2 in the last 72 hours No results found for this basename: HGBA1C:2 in the last 72 hours No results found for this basename: CHOL:2,HDL:2,LDLCALC:2,TRIG:2,CHOLHDL:2,LDLDIRECT:2 in the last 72 hours No results found for this basename: TSH,T4TOTAL,FREET3,T3FREE,THYROIDAB in the last 72 hours No results found for this basename:  VITAMINB12:2,FOLATE:2,FERRITIN:2,TIBC:2,IRON:2,RETICCTPCT:2 in the last 72 hours  Micro Results: No results found for this or any previous visit (from the past 240 hour(s)).  Studies/Results: Dg Chest 2 View  07/07/2011  *RADIOLOGY REPORT*  Clinical Data: Cough and congestion.  Headache  CHEST - 2 VIEW  Comparison: None.  Findings: COPD with pulmonary hyperinflation.  Negative for heart failure or pneumonia.  Possible 1 cm nodule right lung base.  This could be due to overlapping prominent lung markings or possibly a nipple shadow.  IMPRESSION: COPD with hyperinflation.  Possible right lower lobe nodule.  Repeat PA chest x-ray with nipple marker is suggested.  Original Report Authenticated By: Camelia Phenes, M.D.   Ct Head Wo Contrast  07/07/2011  *RADIOLOGY REPORT*  Clinical Data: Left-sided weakness.  CT HEAD WITHOUT CONTRAST  Technique:  Contiguous axial images were obtained from the base of the skull through the vertex without contrast.  Comparison: None  Findings: Minimal patchy chronic small vessel disease in the deep white matter. No acute intracranial abnormality.  Specifically, no hemorrhage, hydrocephalus, mass lesion, acute infarction, or significant intracranial injury.  No acute calvarial abnormality. Mucosal thickening in the ethmoid air cells.  Air-fluid level in the left sphenoid sinus.  Mastoids are clear.  IMPRESSION: Minimal chronic  small vessel disease.  No acute intracranial abnormality.  Original Report Authenticated By: Cyndie Chime, M.D.   Ct Chest W Contrast  07/07/2011  *RADIOLOGY REPORT*  Clinical Data: Cough, congestion.  Evaluate nodule.  CT CHEST WITH CONTRAST  Technique:  Multidetector CT imaging of the chest was performed following the standard protocol during bolus administration of intravenous contrast.  Contrast: 80mL OMNIPAQUE IOHEXOL 300 MG/ML IV SOLN  Comparison: Chest x-ray 07/07/2011  Findings: There are no suspicious pulmonary nodules within the lungs, in  particular in the right lower lobe as questioned on chest x-ray.  This likely represented overlapping shadows.  Mild COPD changes are present.  There is mild peribronchial thickening. Scarring in the right lung base.  No confluent opacities or effusions.  Heart is upper limits normal in size.  Coronary artery and aortic calcifications.  No aortic aneurysm.  There are mildly prominent mediastinal lymph nodes.  Index right paratracheal node measures 11 mm in short axis diameter. Subcarinal lymph node has a short axis diameter of 9 mm.  No hilar or axillary adenopathy.  Esophagus is fluid-filled which may be related to dysmotility or reflux. Visualized thyroid and chest wall soft tissues unremarkable. Imaging into the upper abdomen shows no acute findings.  Proximal superior mesenteric artery is severely calcified and possibly occluded or nearly occluded.  Visualized upper abdomen demonstrates irregular plaque along the posterior wall with diffuse heavy calcifications.  IMPRESSION: Moderate COPD.  No suspicious pulmonary nodules in the lungs.  Previously questioned density on chest x-ray likely represented overlapping shadows.  Coronary artery disease.  Borderline sized mediastinal lymph nodes.  Fluid-filled esophagus which may be related to dysmotility or reflux.  Heavily calcified upper abdominal aorta and branch vessels. Superior mesenteric artery is heavily calcified and may be occluded or nearly occluded proximally.  Original Report Authenticated By: Cyndie Chime, M.D.   Mr Laqueta Jean ZO Contrast  07/07/2011  *RADIOLOGY REPORT*  Clinical Data: Headache.  Left-sided neck pain.  MRI HEAD WITHOUT AND WITH CONTRAST,MR NECK SOFT TISSUE ONLY WITHOUT AND WITH CONTRAST  Technique:  Multiplanar, multiecho pulse sequences of the brain and surrounding structures were obtained according to standard protocol without and with intravenous contrast,  Contrast: 15mL MULTIHANCE GADOBENATE DIMEGLUMINE 529 MG/ML IV SOLN   Comparison: 07/07/2011 head CT.  Findings: Motion degraded exam.  No acute infarct.  No intracranial hemorrhage.  No intracranial mass or abnormal enhancement.  Scattered nonspecific white matter type changes probably related to result of small vessel disease.  No hydrocephalus.  Major intracranial vascular structures are patent.  Mild to moderate paranasal sinus mucosal thickening.  Sella is partially empty.  Posterior-superior nasopharyngeal lobulated cystic appearing lesion greater on the left measuring up to 1.5 x 1.8 x 1.3 cm.  Although this may represent a complex Thornwaldt cyst, other masses not excluded.  Direct visualization may be considered.  Minimal partial opacification right mastoid air cells.  IMPRESSION: Motion degraded exam.  No acute infarct.  No intracranial mass or abnormal enhancement.  Scattered nonspecific white matter type changes probably related to result of small vessel disease.  Posterior-superior nasopharyngeal lobulated cystic appearing lesion greater on the left measuring up to 1.5 x 1.8 x 1.3 cm.  Although this may represent a complex Thornwaldt cyst, other masses not excluded.  Direct visualization may be considered.  Minimal partial opacification right mastoid air cells.  Mild to moderate paranasal sinus mucosal thickening.  MRI NECK WITHOUT AND WITH CONTRAST  Technique:  Multiplanar, multiecho pulse sequences of  the neck and surrounding structures were obtained without and with intravenous contrast.  Findings:  Secondary to the marked motion degradation, evaluation is limited.  Besides the posterior-superior nasopharyngeal septated cystic appearing lesion as discussed above, no other obvious neck mass or adenopathy is noted.  If further delineation is clinically desired, the patient may be better suited for CT imaging.  IMPRESSION: Secondary to the marked motion degradation, evaluation is limited. Besides the posterior-superior nasopharyngeal septated cystic appearing lesion as  discussed above, no other obvious neck mass or adenopathy is noted.  If further delineation is clinically desired, the patient may be better suited for CT imaging.  To be called to the floor by [facility] MR technologist.  Original Report Authenticated By: Fuller Canada, M.D.   Mr Neck Soft Tissue Only W Wo Contrast  07/07/2011  *RADIOLOGY REPORT*  Clinical Data: Headache.  Left-sided neck pain.  MRI HEAD WITHOUT AND WITH CONTRAST,MR NECK SOFT TISSUE ONLY WITHOUT AND WITH CONTRAST  Technique:  Multiplanar, multiecho pulse sequences of the brain and surrounding structures were obtained according to standard protocol without and with intravenous contrast,  Contrast: 15mL MULTIHANCE GADOBENATE DIMEGLUMINE 529 MG/ML IV SOLN  Comparison: 07/07/2011 head CT.  Findings: Motion degraded exam.  No acute infarct.  No intracranial hemorrhage.  No intracranial mass or abnormal enhancement.  Scattered nonspecific white matter type changes probably related to result of small vessel disease.  No hydrocephalus.  Major intracranial vascular structures are patent.  Mild to moderate paranasal sinus mucosal thickening.  Sella is partially empty.  Posterior-superior nasopharyngeal lobulated cystic appearing lesion greater on the left measuring up to 1.5 x 1.8 x 1.3 cm.  Although this may represent a complex Thornwaldt cyst, other masses not excluded.  Direct visualization may be considered.  Minimal partial opacification right mastoid air cells.  IMPRESSION: Motion degraded exam.  No acute infarct.  No intracranial mass or abnormal enhancement.  Scattered nonspecific white matter type changes probably related to result of small vessel disease.  Posterior-superior nasopharyngeal lobulated cystic appearing lesion greater on the left measuring up to 1.5 x 1.8 x 1.3 cm.  Although this may represent a complex Thornwaldt cyst, other masses not excluded.  Direct visualization may be considered.  Minimal partial opacification right mastoid air  cells.  Mild to moderate paranasal sinus mucosal thickening.  MRI NECK WITHOUT AND WITH CONTRAST  Technique:  Multiplanar, multiecho pulse sequences of the neck and surrounding structures were obtained without and with intravenous contrast.  Findings:  Secondary to the marked motion degradation, evaluation is limited.  Besides the posterior-superior nasopharyngeal septated cystic appearing lesion as discussed above, no other obvious neck mass or adenopathy is noted.  If further delineation is clinically desired, the patient may be better suited for CT imaging.  IMPRESSION: Secondary to the marked motion degradation, evaluation is limited. Besides the posterior-superior nasopharyngeal septated cystic appearing lesion as discussed above, no other obvious neck mass or adenopathy is noted.  If further delineation is clinically desired, the patient may be better suited for CT imaging.  To be called to the floor by [facility] MR technologist.  Original Report Authenticated By: Fuller Canada, M.D.   Dg Chest Special View  07/07/2011  *RADIOLOGY REPORT*  Clinical Data: Question right lower lobe nodule  CHEST SPECIAL VIEW  Comparison: Same day  Findings: Film with nipple markers shows that the nipple shadows are not responsible for the nodular density in the right lower lobe.  Heart size is normal.  Hilar shadows are  prominent, presumably due to prominent central pulmonary arteries.  No evidence of acute heart failure or effusion.  No acute bony finding.  Elsewhere, chronic interstitial lung disease and bronchial thickening remain evident.  IMPRESSION: Density in the right lower lobe is not due to a nipple shadow.  Chronic appearing interstitial lung markings.  Bronchial thickening suggesting bronchitis.  If the clinical presentation is consistent with bronchitis, this nodule could be reevaluated after treatment.  Alternatively, one could perform chest CT today. Which  ever is chosen, I would definitely suggest some sort of  follow-up.  Original Report Authenticated By: Thomasenia Sales, M.D.    Medications: I have reviewed the patient's current medications. Scheduled Meds:   . albuterol  2.5 mg Nebulization Once  . albuterol  2.5 mg Nebulization TID  . heparin  5,000 Units Subcutaneous Q8H  . influenza  inactive virus vaccine  0.5 mL Intramuscular Tomorrow-1000  . ipratropium  0.5 mg Nebulization Once  . ipratropium  0.5 mg Nebulization TID  . methylPREDNISolone (SOLU-MEDROL) injection  60 mg Intravenous Q6H  . moxifloxacin  400 mg Intravenous Q24H  . pneumococcal 23 valent vaccine  0.5 mL Intramuscular Tomorrow-1000  . DISCONTD: albuterol  2.5 mg Nebulization Q6H  . DISCONTD: albuterol  5 mg Nebulization Q6H   Continuous Infusions:  PRN Meds:.albuterol, gadobenate dimeglumine, HYDROmorphone (DILAUDID) injection, ibuprofen, iohexol, oxyCODONE-acetaminophen, DISCONTD:  HYDROmorphone (DILAUDID) injection, DISCONTD: ipratropium, DISCONTD: ondansetron (ZOFRAN) IV  Assessment/Plan: 1-Hypoxia likely the patient has a history of COPD which is progressing and patient medically at home oxygen on discharge, at this time with treat with nebs, would continue with the steroid IV antibiotic, would continue to taper oxygen hopefully the patient would be able to discharge without home oxygenation, she has chronic history of smoking, would check proBNP. 2-left occipital pain resolved 3-left nasopharyngeal septatedcystic lesion would arrange outpatient followup with an ENT for nasoscope and laryngoscope. Patient understands and she will follow that as outpatient 4-smoking counseling  LOS: 1 day  Demarcus Thielke I. 07/08/2011, 2:27 PM

## 2011-07-09 LAB — PRO B NATRIURETIC PEPTIDE: Pro B Natriuretic peptide (BNP): 1044 pg/mL — ABNORMAL HIGH (ref 0–125)

## 2011-07-09 MED ORDER — FUROSEMIDE 10 MG/ML IJ SOLN
40.0000 mg | Freq: Every day | INTRAMUSCULAR | Status: DC
  Administered 2011-07-10: 40 mg via INTRAVENOUS
  Filled 2011-07-09: qty 4

## 2011-07-09 MED ORDER — POTASSIUM CHLORIDE CRYS ER 20 MEQ PO TBCR
20.0000 meq | EXTENDED_RELEASE_TABLET | Freq: Two times a day (BID) | ORAL | Status: DC
  Administered 2011-07-09 – 2011-07-10 (×3): 20 meq via ORAL
  Filled 2011-07-09 (×3): qty 1

## 2011-07-09 MED ORDER — FLUTICASONE-SALMETEROL 250-50 MCG/DOSE IN AEPB
1.0000 | INHALATION_SPRAY | Freq: Two times a day (BID) | RESPIRATORY_TRACT | Status: DC
  Administered 2011-07-09 – 2011-07-10 (×2): 1 via RESPIRATORY_TRACT
  Filled 2011-07-09: qty 14

## 2011-07-09 MED ORDER — FUROSEMIDE 10 MG/ML IJ SOLN
40.0000 mg | Freq: Once | INTRAMUSCULAR | Status: AC
  Administered 2011-07-09: 40 mg via INTRAVENOUS
  Filled 2011-07-09: qty 4

## 2011-07-09 NOTE — Progress Notes (Signed)
Writer removed O2 off of pt to see what O2 sat would be.  Pt was at rest for a bit and then ambulated to BR without difficulty and SOB.  Writer rechecked O2 sat 20 minutes later it was down to 80% on RA.  Writer placed O2 via  at 2 lpm back in and O2 sats came up to 91%.

## 2011-07-09 NOTE — Progress Notes (Signed)
Subjective: She denies any chest pain, or shortness of breath, she has chronic cough she also has hoarseness, and tapering oxygenating was unsuccessful patient informed about the likely she would be discharged with oxygen   Objective: Vital signs in last 24 hours: Filed Vitals:   07/08/11 2222 07/09/11 0608 07/09/11 0758 07/09/11 1355  BP: 117/67 95/58  144/52  Pulse: 73 68  84  Temp: 97.8 F (36.6 C) 97.9 F (36.6 C)  98 F (36.7 C)  TempSrc: Oral Oral    Resp: 24 20  19   Height:      Weight:      SpO2: 93% 97% 92% 94%   Weight change:   Intake/Output Summary (Last 24 hours) at 07/09/11 1358 Last data filed at 07/09/11 0811  Gross per 24 hour  Intake    480 ml  Output   1650 ml  Net  -1170 ml   She is lying comfortably on bed not on respiratory distress or shortness of breath Neck supple with no lymph node, heart S1 and S2 with no added sounds; lungs mild rails bilateral abdomen soft nontender bowel sounds present, extremity without edema and peripheral pulses intact   Lab Results:  [facility] 07/08/11 0658 07/07/11 1133  NA 136 137  K 4.1 3.5  CL 100 99  CO2 28 --  GLUCOSE 151* 115*  BUN 12 11  CREATININE 0.53 0.70  CALCIUM 9.0 --  MG -- --  PHOS -- --    Basename 07/08/11 0658  AST 13  ALT 15  ALKPHOS 58  BILITOT 0.2*  PROT 6.6  ALBUMIN 3.0*   No results found for this basename: LIPASE:2,AMYLASE:2 in the last 72 hours  Basename 07/08/11 0658 07/07/11 1133  WBC 7.4 --  NEUTROABS -- --  HGB 13.7 16.0*  HCT 40.5 47.0*  MCV 92.5 --  PLT 173 --    Basename 07/07/11 1906  CKTOTAL 126  CKMB 4.0  CKMBINDEX --  TROPONINI <0.30   ETT frequent bigeminy  Micro Results: No results found for this or any previous visit (from the past 240 hour(s)).  Studies/Results: Dg Chest 2 View  07/07/2011  *RADIOLOGY REPORT*  Clinical Data: Cough and congestion.  Headache  CHEST - 2 VIEW  Comparison: None.  Findings: COPD with pulmonary hyperinflation.  Negative  for heart failure or pneumonia.  Possible 1 cm nodule right lung base.  This could be due to overlapping prominent lung markings or possibly a nipple shadow.  IMPRESSION: COPD with hyperinflation.  Possible right lower lobe nodule.  Repeat PA chest x-ray with nipple marker is suggested.  Original Report Authenticated By: Camelia Phenes, M.D.   Ct Head Wo Contrast  07/07/2011  *RADIOLOGY REPORT*  Clinical Data: Left-sided weakness.  CT HEAD WITHOUT CONTRAST  Technique:  Contiguous axial images were obtained from the base of the skull through the vertex without contrast.  Comparison: None  Findings: Minimal patchy chronic small vessel disease in the deep white matter. No acute intracranial abnormality.  Specifically, no hemorrhage, hydrocephalus, mass lesion, acute infarction, or significant intracranial injury.  No acute calvarial abnormality. Mucosal thickening in the ethmoid air cells.  Air-fluid level in the left sphenoid sinus.  Mastoids are clear.  IMPRESSION: Minimal chronic small vessel disease.  No acute intracranial abnormality.  Original Report Authenticated By: Cyndie Chime, M.D.   Ct Chest W Contrast  07/07/2011  *RADIOLOGY REPORT*  Clinical Data: Cough, congestion.  Evaluate nodule.  CT CHEST WITH CONTRAST  Technique:  Multidetector  CT imaging of the chest was performed following the standard protocol during bolus administration of intravenous contrast.  Contrast: 80mL OMNIPAQUE IOHEXOL 300 MG/ML IV SOLN  Comparison: Chest x-ray 07/07/2011  Findings: There are no suspicious pulmonary nodules within the lungs, in particular in the right lower lobe as questioned on chest x-ray.  This likely represented overlapping shadows.  Mild COPD changes are present.  There is mild peribronchial thickening. Scarring in the right lung base.  No confluent opacities or effusions.  Heart is upper limits normal in size.  Coronary artery and aortic calcifications.  No aortic aneurysm.  There are mildly prominent  mediastinal lymph nodes.  Index right paratracheal node measures 11 mm in short axis diameter. Subcarinal lymph node has a short axis diameter of 9 mm.  No hilar or axillary adenopathy.  Esophagus is fluid-filled which may be related to dysmotility or reflux. Visualized thyroid and chest wall soft tissues unremarkable. Imaging into the upper abdomen shows no acute findings.  Proximal superior mesenteric artery is severely calcified and possibly occluded or nearly occluded.  Visualized upper abdomen demonstrates irregular plaque along the posterior wall with diffuse heavy calcifications.  IMPRESSION: Moderate COPD.  No suspicious pulmonary nodules in the lungs.  Previously questioned density on chest x-ray likely represented overlapping shadows.  Coronary artery disease.  Borderline sized mediastinal lymph nodes.  Fluid-filled esophagus which may be related to dysmotility or reflux.  Heavily calcified upper abdominal aorta and branch vessels. Superior mesenteric artery is heavily calcified and may be occluded or nearly occluded proximally.  Original Report Authenticated By: Cyndie Chime, M.D.   Mr Laqueta Jean ZO Contrast  07/07/2011  *RADIOLOGY REPORT*  Clinical Data: Headache.  Left-sided neck pain.  MRI HEAD WITHOUT AND WITH CONTRAST,MR NECK SOFT TISSUE ONLY WITHOUT AND WITH CONTRAST  Technique:  Multiplanar, multiecho pulse sequences of the brain and surrounding structures were obtained according to standard protocol without and with intravenous contrast,  Contrast: 15mL MULTIHANCE GADOBENATE DIMEGLUMINE 529 MG/ML IV SOLN  Comparison: 07/07/2011 head CT.  Findings: Motion degraded exam.  No acute infarct.  No intracranial hemorrhage.  No intracranial mass or abnormal enhancement.  Scattered nonspecific white matter type changes probably related to result of small vessel disease.  No hydrocephalus.  Major intracranial vascular structures are patent.  Mild to moderate paranasal sinus mucosal thickening.  Sella is  partially empty.  Posterior-superior nasopharyngeal lobulated cystic appearing lesion greater on the left measuring up to 1.5 x 1.8 x 1.3 cm.  Although this may represent a complex Thornwaldt cyst, other masses not excluded.  Direct visualization may be considered.  Minimal partial opacification right mastoid air cells.  IMPRESSION: Motion degraded exam.  No acute infarct.  No intracranial mass or abnormal enhancement.  Scattered nonspecific white matter type changes probably related to result of small vessel disease.  Posterior-superior nasopharyngeal lobulated cystic appearing lesion greater on the left measuring up to 1.5 x 1.8 x 1.3 cm.  Although this may represent a complex Thornwaldt cyst, other masses not excluded.  Direct visualization may be considered.  Minimal partial opacification right mastoid air cells.  Mild to moderate paranasal sinus mucosal thickening.  MRI NECK WITHOUT AND WITH CONTRAST  Technique:  Multiplanar, multiecho pulse sequences of the neck and surrounding structures were obtained without and with intravenous contrast.  Findings:  Secondary to the marked motion degradation, evaluation is limited.  Besides the posterior-superior nasopharyngeal septated cystic appearing lesion as discussed above, no other obvious neck mass or adenopathy is noted.  If further delineation is clinically desired, the patient may be better suited for CT imaging.  IMPRESSION: Secondary to the marked motion degradation, evaluation is limited. Besides the posterior-superior nasopharyngeal septated cystic appearing lesion as discussed above, no other obvious neck mass or adenopathy is noted.  If further delineation is clinically desired, the patient may be better suited for CT imaging.  To be called to the floor by [facility] MR technologist.  Original Report Authenticated By: Fuller Canada, M.D.   Mr Neck Soft Tissue Only W Wo Contrast  07/07/2011  *RADIOLOGY REPORT*  Clinical Data: Headache.  Left-sided neck pain.   MRI HEAD WITHOUT AND WITH CONTRAST,MR NECK SOFT TISSUE ONLY WITHOUT AND WITH CONTRAST  Technique:  Multiplanar, multiecho pulse sequences of the brain and surrounding structures were obtained according to standard protocol without and with intravenous contrast,  Contrast: 15mL MULTIHANCE GADOBENATE DIMEGLUMINE 529 MG/ML IV SOLN  Comparison: 07/07/2011 head CT.  Findings: Motion degraded exam.  No acute infarct.  No intracranial hemorrhage.  No intracranial mass or abnormal enhancement.  Scattered nonspecific white matter type changes probably related to result of small vessel disease.  No hydrocephalus.  Major intracranial vascular structures are patent.  Mild to moderate paranasal sinus mucosal thickening.  Sella is partially empty.  Posterior-superior nasopharyngeal lobulated cystic appearing lesion greater on the left measuring up to 1.5 x 1.8 x 1.3 cm.  Although this may represent a complex Thornwaldt cyst, other masses not excluded.  Direct visualization may be considered.  Minimal partial opacification right mastoid air cells.  IMPRESSION: Motion degraded exam.  No acute infarct.  No intracranial mass or abnormal enhancement.  Scattered nonspecific white matter type changes probably related to result of small vessel disease.  Posterior-superior nasopharyngeal lobulated cystic appearing lesion greater on the left measuring up to 1.5 x 1.8 x 1.3 cm.  Although this may represent a complex Thornwaldt cyst, other masses not excluded.  Direct visualization may be considered.  Minimal partial opacification right mastoid air cells.  Mild to moderate paranasal sinus mucosal thickening.  MRI NECK WITHOUT AND WITH CONTRAST  Technique:  Multiplanar, multiecho pulse sequences of the neck and surrounding structures were obtained without and with intravenous contrast.  Findings:  Secondary to the marked motion degradation, evaluation is limited.  Besides the posterior-superior nasopharyngeal septated cystic appearing lesion  as discussed above, no other obvious neck mass or adenopathy is noted.  If further delineation is clinically desired, the patient may be better suited for CT imaging.  IMPRESSION: Secondary to the marked motion degradation, evaluation is limited. Besides the posterior-superior nasopharyngeal septated cystic appearing lesion as discussed above, no other obvious neck mass or adenopathy is noted.  If further delineation is clinically desired, the patient may be better suited for CT imaging.  To be called to the floor by [facility] MR technologist.  Original Report Authenticated By: Fuller Canada, M.D.   Dg Chest Special View  07/07/2011  *RADIOLOGY REPORT*  Clinical Data: Question right lower lobe nodule  CHEST SPECIAL VIEW  Comparison: Same day  Findings: Film with nipple markers shows that the nipple shadows are not responsible for the nodular density in the right lower lobe.  Heart size is normal.  Hilar shadows are prominent, presumably due to prominent central pulmonary arteries.  No evidence of acute heart failure or effusion.  No acute bony finding.  Elsewhere, chronic interstitial lung disease and bronchial thickening remain evident.  IMPRESSION: Density in the right lower lobe is not due to a  nipple shadow.  Chronic appearing interstitial lung markings.  Bronchial thickening suggesting bronchitis.  If the clinical presentation is consistent with bronchitis, this nodule could be reevaluated after treatment.  Alternatively, one could perform chest CT today. Which  ever is chosen, I would definitely suggest some sort of follow-up.  Original Report Authenticated By: Thomasenia Sales, M.D.    Medications: I have reviewed the patient's current medications. Scheduled Meds:   . albuterol  2.5 mg Nebulization TID  . furosemide  40 mg Intravenous Once  . furosemide  40 mg Intravenous Daily  . heparin  5,000 Units Subcutaneous Q8H  . ipratropium  0.5 mg Nebulization TID  . methylPREDNISolone (SOLU-MEDROL)  injection  60 mg Intravenous Q6H  . moxifloxacin  400 mg Intravenous Q24H   Continuous Infusions:  PRN Meds:.albuterol, HYDROmorphone (DILAUDID) injection, ibuprofen, oxyCODONE-acetaminophen  Assessment/Plan: 1-hypoxia likely chronic from COPD, currently no active wheezing but mild rails, noticed  high intubated BNP. Patient actually denies any shortness of breath and denies any chest pain, EKG suggest frequent PVCs, we would administer Lasix 40 mg IV daily with potassium supplement with check 2-D echo, patient likely would be discharged with home oxygen.  I felt the patient hypoxia is chronic, would continue nebs treatment and antibiotics for now 2-nasopharyngeal complex cyst to be arrange followup with an ENT as outpatient  LOS: 2 days  Cashel Bellina I. 07/09/2011, 1:58 PM

## 2011-07-10 ENCOUNTER — Other Ambulatory Visit: Payer: Self-pay

## 2011-07-10 ENCOUNTER — Inpatient Hospital Stay (HOSPITAL_COMMUNITY): Payer: Self-pay

## 2011-07-10 DIAGNOSIS — I059 Rheumatic mitral valve disease, unspecified: Secondary | ICD-10-CM

## 2011-07-10 LAB — BASIC METABOLIC PANEL
BUN: 27 mg/dL — ABNORMAL HIGH (ref 6–23)
CO2: 32 mEq/L (ref 19–32)
Calcium: 9.4 mg/dL (ref 8.4–10.5)
Chloride: 99 mEq/L (ref 96–112)
Creatinine, Ser: 0.63 mg/dL (ref 0.50–1.10)
GFR calc Af Amer: >90 mL/min (ref >90)
GFR calc non Af Amer: >90 mL/min (ref >90)
Glucose, Bld: 168 mg/dL — ABNORMAL HIGH (ref 70–99)
Potassium: 3.8 mEq/L (ref 3.5–5.1)
Sodium: 141 mEq/L (ref 135–145)

## 2011-07-10 LAB — PRO B NATRIURETIC PEPTIDE: Pro B Natriuretic peptide (BNP): 1004 pg/mL — ABNORMAL HIGH (ref 0–125)

## 2011-07-10 MED ORDER — FLUTICASONE-SALMETEROL 250-50 MCG/DOSE IN AEPB: 1.0000 | INHALATION_SPRAY | Freq: Two times a day (BID) | RESPIRATORY_TRACT | Status: DC

## 2011-07-10 MED ORDER — PREDNISONE (PAK) 10 MG PO TABS: 10.0000 mg | ORAL_TABLET | Freq: Every day | ORAL | Status: AC

## 2011-07-10 MED ORDER — MOXIFLOXACIN HCL 400 MG PO TABS: 400.0000 mg | ORAL_TABLET | Freq: Every day | ORAL | Status: AC

## 2011-07-10 NOTE — Discharge Summary (Signed)
DISCHARGE SUMMARY  [name]  MR#: [id number]  DOB:Oct 18, 1945  Date of Admission: 07/07/2011 Date of Discharge: 07/10/2011  Attending Physician:Kerensa Nicklas I.  Patient's WUJ:Kayla Kayla Barre, MD, MD  Consults: None  Discharge Diagnoses: 1-hypoxia secondary to COPD 2-COPD with exacerbation requiring home oxygen 3-pVC is on telemetry monitor, with no chest pain, echo pending 4-ongoing tobacco abuse 5-headache resolve 6-complex cyst as the nasopharyngeal need direct visualization to be arranged as outpatient Current Discharge Medication List    START taking these medications   Details  Fluticasone-Salmeterol (ADVAIR) 250-50 MCG/DOSE AEPB Inhale 1 puff into the lungs 2 (two) times daily. Qty: 60 each, Refills: 0    moxifloxacin (AVELOX) 400 MG tablet Take 1 tablet (400 mg total) by mouth daily. Qty: 3 tablet, Refills: 0    predniSONE (STERAPRED UNI-PAK) 10 MG tablet Take 1 tablet (10 mg total) by mouth daily. 4 tabs for 2 days,3 tabs for 2 days then, 2 tabs for 2 days, then 1 tab for 2 day Qty: 20 tablet, Refills: 0      STOP taking these medications     ibuprofen (ADVIL,MOTRIN) 200 MG tablet        Procedure: Chest x-ray Emphysematous changes without acute or superimposed  abnormality. Echo pending Chest x-rayDensity in the right lower lobe is not due to a nipple shadow.  Chronic appearing interstitial lung markings. Bronchial thickening  suggesting bronchitis.  If the clinical presentation is consistent with bronchitis, this  nodule could be reevaluated after treatment. Alternatively, one  could perform chest CT today. Which ever is chosen, I would  definitely suggest some sort of follow-up.  CT chest with contrastModerate COPD.  No suspicious pulmonary nodules in the lungs. Previously  questioned density on chest x-ray likely represented overlapping  shadows.  Coronary artery disease.  Borderline sized mediastinal lymph nodes.  Fluid-filled esophagus which may  be related to dysmotility or  reflux.  Heavily calcified upper abdominal aorta and branch vessels.  Superior mesenteric artery is heavily calcified and may be occluded  or nearly occluded proximally. MRI of the brain and neckMotion degraded exam.  No acute infarct.  No intracranial mass or abnormal enhancement.  Scattered nonspecific white matter type changes probably related to  result of small vessel disease.  Posterior-superior nasopharyngeal lobulated cystic appearing lesion  greater on the left measuring up to 1.5 x 1.8 x 1.3 cm. Although  this may represent a complex Thornwaldt cyst, other masses not  excluded. Direct visualization may be considered.  Minimal partial opacification right mastoid air cells.  Mild to moderate paranasal sinus mucosal thickening  Hospital Course: 27 female history of chronic long-standing tobacco abuse, COPD not on home oxygen, she was on her regular state of health when she slowly developed CVA pain behind her left ear was no neck stiffness or any neurological deficit, she denies any shortness of breath, she found to have oxygen saturation at low 80s, denies any chest pain accordingly patient admitted for evaluation of her hypoxia, she have and chest x-ray which did show COPD and some lobule but a CT scan of the chest did not show that nodule, accordingly she admitted to the hospital 1-hypoxia felt to be chronic secondary to ongoing COPD and she likely require oxygen at home, multiple nebulizers, steroids, and antibiotic, patient did not felt any shortness of breath, or chest pain, patient for the criteria for home oxygenation, BNP was high. But on examination there is no evidence of frail, and it is no evidence of pulmonary edema,  2-D echo at this time bending .Hypoxia felt mainly secondary to COPD 2-COPD patient treated as a case of pneumonia and COPD exacerbation, patient denies any shortness of breath. Patient advised to quit smoking 3-nasopharyngeal  complex cyst as the posterior-superior, this is an 8 father died visualization to exclude any malignancy, patient will arrange with her husband ear nose and throat M.D. 4- Day of Discharge BP 121/58  Pulse 57  Temp(Src) 97.9 F (36.6 C) (Oral)  Resp 20  Ht 5\' 8"  (1.727 m)  Wt 67.994 kg (149 lb 14.4 oz)  BMI 22.79 kg/m2  SpO2 88%  Physical Exam: On examination she is sitting comfortably not on respiratory distress, mucosa moist no oral thrush and no tonsillar hypertrophy, no ear discharge Heart S1 and S2 with no added sounds Lungs examination normal vesicular breathing, no wheezing or rales Abdomen soft nontender bowel sounds present extremities without edema extremities without edema peripheral pulses intact   Results for orders placed during the hospital encounter of 07/07/11 (from the past 24 hour(s))  BASIC METABOLIC PANEL     Status: Abnormal   Collection Time   07/10/11  5:56 AM      Component Value Range   Sodium 141  135 - 145 (mEq/L)   Potassium 3.8  3.5 - 5.1 (mEq/L)   Chloride 99  96 - 112 (mEq/L)   CO2 32  19 - 32 (mEq/L)   Glucose, Bld 168 (*) 70 - 99 (mg/dL)   BUN 27 (*) 6 - 23 (mg/dL)   Creatinine, Ser 1.19  0.50 - 1.10 (mg/dL)   Calcium 9.4  8.4 - 14.7 (mg/dL)   GFR calc non Af Amer >90  >90 (mL/min)   GFR calc Af Amer >90  >90 (mL/min)  PRO B NATRIURETIC PEPTIDE     Status: Abnormal   Collection Time   07/10/11  5:56 AM      Component Value Range   Pro B Natriuretic peptide (BNP) 1004.0 (*) 0 - 125 (pg/mL)   EKG sinus rhythm with frequent PVCs  Disposition: Home with home oxygen   Follow-up Appts: Discharge Orders    Future Orders Please Complete By Expires   For home use only DME oxygen      Diet - low sodium heart healthy      Activity as tolerated - No restrictions         Follow-up with MD,next week, also follow up with an ENT  As out patient   Tests Needing Follow-up:  echo report, I would contact the patient with report of the  echo  Signed: Roxan Yamamoto I. 07/10/2011, 3:15 PM

## 2011-07-10 NOTE — Progress Notes (Signed)
IV removed, site WNL.  Pt given d/c instructions and new prescriptions.  Discussed home care with patient and discussed home medications, patient verbalizes understanding. F/U appointments with PCP and ENT doctor to be made by pt, pt states they will keep appointments. Pt is stable at this time. Pt taken to main entrance in wheelchair by staff member.

## 2011-07-10 NOTE — Progress Notes (Signed)
CARE MANAGEMENT NOTE 07/10/2011  Patient:  Kayla Sawyer, Kayla Sawyer   Account Number:  [id number]  Date Initiated:  07/10/2011  Documentation initiated by:  Andi Devon Assessment:   65 yr old female with ex of copd lives at home  may need home o2     Action/Plan:   Anticipated DC Date:  07/10/2011   Anticipated DC Plan:  HOME/SELF CARE  In-house referral  Financial Counselor      DC Planning Services  CM consult      Choice offered to / List presented to:  C-1 Patient   DME arranged  [facility]      DME agency   [facility]        Status of service:   Medicare Important Message given?   (If response is "NO", the following Medicare IM given date fields will be blank) Date Medicare IM given:   Date Additional Medicare IM given:    Discharge Disposition:  HOME/SELF CARE  Per UR Regulation:  Reviewed for med. necessity/level of care/duration of stay  Comments:  07/10/2011 Mendel Corning rn bsn  pt o2 sat 84 % ra. arranged home o2 as above with portable o2 tank brought to the hospital

## 2011-07-10 NOTE — Progress Notes (Signed)
*  PRELIMINARY RESULTS* Echocardiogram 2D Echocardiogram has been performed.  Conrad Benavides 07/10/2011, 11:05 AM

## 2011-07-14 NOTE — Progress Notes (Signed)
Utilization review completed.  

## 2012-12-12 ENCOUNTER — Emergency Department (HOSPITAL_COMMUNITY)
Admission: EM | Admit: 2012-12-12 | Discharge: 2012-12-13 | Disposition: A | Discharge status: Home / Self Care | Payer: Medicare Other | Attending: Emergency Medicine | Admitting: Emergency Medicine

## 2012-12-12 ENCOUNTER — Encounter (HOSPITAL_COMMUNITY): Payer: Self-pay | Admitting: Emergency Medicine

## 2012-12-12 DIAGNOSIS — Z87891 Personal history of nicotine dependence: Secondary | ICD-10-CM | POA: Insufficient documentation

## 2012-12-12 DIAGNOSIS — M549 Dorsalgia, unspecified: Secondary | ICD-10-CM

## 2012-12-12 DIAGNOSIS — Z79899 Other long term (current) drug therapy: Secondary | ICD-10-CM | POA: Insufficient documentation

## 2012-12-12 DIAGNOSIS — Z9889 Other specified postprocedural states: Secondary | ICD-10-CM | POA: Insufficient documentation

## 2012-12-12 DIAGNOSIS — M545 Low back pain, unspecified: Secondary | ICD-10-CM | POA: Insufficient documentation

## 2012-12-12 DIAGNOSIS — J4489 Other specified chronic obstructive pulmonary disease: Secondary | ICD-10-CM | POA: Insufficient documentation

## 2012-12-12 DIAGNOSIS — Z8701 Personal history of pneumonia (recurrent): Secondary | ICD-10-CM | POA: Insufficient documentation

## 2012-12-12 DIAGNOSIS — J449 Chronic obstructive pulmonary disease, unspecified: Secondary | ICD-10-CM | POA: Insufficient documentation

## 2012-12-12 DIAGNOSIS — Z862 Personal history of diseases of the blood and blood-forming organs and certain disorders involving the immune mechanism: Secondary | ICD-10-CM | POA: Insufficient documentation

## 2012-12-12 HISTORY — DX: Sepsis, unspecified organism: A41.9

## 2012-12-12 HISTORY — DX: Pneumonia, unspecified organism: J18.9

## 2012-12-12 HISTORY — DX: Chronic obstructive pulmonary disease, unspecified: J44.9

## 2012-12-12 NOTE — ED Notes (Signed)
MD at bedside. 

## 2012-12-12 NOTE — ED Notes (Signed)
Pt c/o back pain starting on Monday. Pt was discharged from hospital for pneumonia and UTI on Friday. Today pain in back has gotten worse with pain going into both hips and patient is unable to ambulate as normal. Denies any UTI symptoms.

## 2012-12-13 ENCOUNTER — Emergency Department (HOSPITAL_COMMUNITY): Payer: Medicare Other

## 2012-12-13 LAB — URINALYSIS, ROUTINE W REFLEX MICROSCOPIC
Bilirubin Urine: NEGATIVE
Glucose, UA: NEGATIVE mg/dL
Ketones, ur: NEGATIVE mg/dL
Leukocytes, UA: NEGATIVE
Nitrite: NEGATIVE
Protein, ur: NEGATIVE mg/dL
Specific Gravity, Urine: 1.02 (ref 1.005–1.030)
Urobilinogen, UA: 0.2 mg/dL (ref 0.0–1.0)
pH: 6 (ref 5.0–8.0)

## 2012-12-13 LAB — BASIC METABOLIC PANEL
BUN: 20 mg/dL (ref 6–23)
CO2: 29 mEq/L (ref 19–32)
Calcium: 9.1 mg/dL (ref 8.4–10.5)
Chloride: 99 mEq/L (ref 96–112)
Creatinine, Ser: 0.73 mg/dL (ref 0.50–1.10)
GFR calc Af Amer: >90 mL/min (ref >90)
GFR calc non Af Amer: 87 mL/min — ABNORMAL LOW (ref >90)
Glucose, Bld: 102 mg/dL — ABNORMAL HIGH (ref 70–99)
Potassium: 4.2 mEq/L (ref 3.5–5.1)
Sodium: 137 mEq/L (ref 135–145)

## 2012-12-13 LAB — CBC
HCT: 40.4 % (ref 36.0–46.0)
Hemoglobin: 13.6 g/dL (ref 12.0–15.0)
MCH: 30.6 pg (ref 26.0–34.0)
MCHC: 33.7 g/dL (ref 30.0–36.0)
MCV: 91 fL (ref 78.0–100.0)
Platelets: 322 10*3/uL (ref 150–400)
RBC: 4.44 MIL/uL (ref 3.87–5.11)
RDW: 14.2 % (ref 11.5–15.5)
WBC: 12.2 10*3/uL — ABNORMAL HIGH (ref 4.0–10.5)

## 2012-12-13 LAB — URINE MICROSCOPIC-ADD ON

## 2012-12-13 MED ORDER — FENTANYL CITRATE 0.05 MG/ML IJ SOLN
50.0000 ug | Freq: Once | INTRAMUSCULAR | Status: AC
  Administered 2012-12-13: 50 ug via INTRAVENOUS
  Filled 2012-12-13: qty 2

## 2012-12-13 MED ORDER — ONDANSETRON HCL 4 MG/2ML IJ SOLN
4.0000 mg | Freq: Once | INTRAMUSCULAR | Status: AC
  Administered 2012-12-13: 4 mg via INTRAVENOUS
  Filled 2012-12-13: qty 2

## 2012-12-13 MED ORDER — HYDROCODONE-ACETAMINOPHEN 5-325 MG PO TABS: 2.0000 | ORAL_TABLET | ORAL | Status: DC | PRN

## 2012-12-13 MED ORDER — DIAZEPAM 5 MG PO TABS
5.0000 mg | ORAL_TABLET | Freq: Once | ORAL | Status: AC
  Administered 2012-12-13: 5 mg via ORAL
  Filled 2012-12-13: qty 1

## 2012-12-13 MED ORDER — CYCLOBENZAPRINE HCL 10 MG PO TABS: 10.0000 mg | ORAL_TABLET | Freq: Two times a day (BID) | ORAL | Status: DC | PRN

## 2012-12-13 MED ORDER — SODIUM CHLORIDE 0.9 % IV SOLN
Freq: Once | INTRAVENOUS | Status: AC
  Administered 2012-12-13: 01:00:00 via INTRAVENOUS

## 2012-12-13 NOTE — ED Provider Notes (Signed)
History     CSN: [id number]  Arrival date & time 12/12/12  2303   First MD Initiated Contact with Patient 12/12/12 2303      Chief Complaint  Patient presents with  . Back Pain    (Consider location/radiation/quality/duration/timing/severity/associated sxs/prior treatment) HPI LBP onset about 4 days after 4 day hospitalization in [facility].  Pain located across lower back, no radiation of mild to mod pain that started to get worse yesterday and then became severe last night. Aching pain, hurts move, walk or roll over.  Remote h/o LBP s/p surgery over 20 years ago without problems since that time. PT was septic with her admit recently but has not had any fevers, emesis, ABD pain, cough or infectious symptoms since then.  She is on ABx currently.  Past Medical History  Diagnosis Date  . Sepsis   . Pneumonia   . COPD (chronic obstructive pulmonary disease)     Past Surgical History  Procedure Laterality Date  . Elbow surgery    . Abdominal hysterectomy      partial  . Back surgery      No family history on file.  History  Substance Use Topics  . Smoking status: Former Smoker -- 1.00 packs/day    Types: Cigarettes  . Smokeless tobacco: Not on file  . Alcohol Use: No    OB History   Grav Para Term Preterm Abortions TAB SAB Ect Mult Living                  Review of Systems  Constitutional: Negative for fever and chills.  HENT: Negative for neck pain and neck stiffness.   Eyes: Negative for pain.  Respiratory: Negative for shortness of breath.   Cardiovascular: Negative for chest pain.  Gastrointestinal: Negative for abdominal pain.  Genitourinary: Negative for dysuria.  Musculoskeletal: Positive for back pain.  Skin: Negative for rash.  Neurological: Negative for headaches.  All other systems reviewed and are negative.    Allergies  Review of patient's allergies indicates no known allergies.  Home Medications   Current Outpatient Rx  Name  Route   Sig  Dispense  Refill  . cefUROXime (CEFTIN) 500 MG tablet   Oral   Take 500 mg by mouth 2 (two) times daily.         . Fluticasone-Salmeterol (ADVAIR) 250-50 MCG/DOSE AEPB   Inhalation   Inhale 1 puff into the lungs 2 (two) times daily.   60 each   0     BP 103/66  Pulse 119  Temp(Src) 98.7 F (37.1 C) (Oral)  Resp 22  SpO2 93%  Physical Exam  Nursing note and vitals reviewed. Constitutional: She is oriented to person, place, and time. She appears well-developed and well-nourished.  HENT:  Head: Normocephalic and atraumatic.  Eyes: EOM are normal. Pupils are equal, round, and reactive to light.  Neck: Neck supple.  Cardiovascular: Normal heart sounds and intact distal pulses.   Pulmonary/Chest: Effort normal. No respiratory distress.  Musculoskeletal: Normal range of motion. She exhibits no edema.  TTP across lower lumbar spine, no deformity or rash. Lower extremity strengths and sensorium to light touch equal and intact. 1 plus peripheral edema.   Neurological: She is alert and oriented to person, place, and time.  Skin: Skin is warm and dry.    ED Course  Procedures (including critical care time)  Results for orders placed during the hospital encounter of 12/12/12  CBC      Result Value  Range   WBC 12.2 (*) 4.0 - 10.5 K/uL   RBC 4.44  3.87 - 5.11 MIL/uL   Hemoglobin 13.6  12.0 - 15.0 g/dL   HCT 56.4  33.2 - 95.1 %   MCV 91.0  78.0 - 100.0 fL   MCH 30.6  26.0 - 34.0 pg   MCHC 33.7  30.0 - 36.0 g/dL   RDW 88.4  16.6 - 06.3 %   Platelets 322  150 - 400 K/uL  BASIC METABOLIC PANEL      Result Value Range   Sodium 137  135 - 145 mEq/L   Potassium 4.2  3.5 - 5.1 mEq/L   Chloride 99  96 - 112 mEq/L   CO2 29  19 - 32 mEq/L   Glucose, Bld 102 (*) 70 - 99 mg/dL   BUN 20  6 - 23 mg/dL   Creatinine, Ser 0.16  0.50 - 1.10 mg/dL   Calcium 9.1  8.4 - 01.0 mg/dL   GFR calc non Af Amer 87 (*) >90 mL/min   GFR calc Af Amer >90  >90 mL/min  URINALYSIS, ROUTINE W REFLEX  MICROSCOPIC      Result Value Range   Color, Urine YELLOW  YELLOW   APPearance CLEAR  CLEAR   Specific Gravity, Urine 1.020  1.005 - 1.030   pH 6.0  5.0 - 8.0   Glucose, UA NEGATIVE  NEGATIVE mg/dL   Hgb urine dipstick TRACE (*) NEGATIVE   Bilirubin Urine NEGATIVE  NEGATIVE   Ketones, ur NEGATIVE  NEGATIVE mg/dL   Protein, ur NEGATIVE  NEGATIVE mg/dL   Urobilinogen, UA 0.2  0.0 - 1.0 mg/dL   Nitrite NEGATIVE  NEGATIVE   Leukocytes, UA NEGATIVE  NEGATIVE  URINE MICROSCOPIC-ADD ON      Result Value Range   WBC, UA 0-2  <3 WBC/hpf   RBC / HPF 0-2  <3 RBC/hpf   Dg Lumbar Spine Complete  12/13/2012   *RADIOLOGY REPORT*  Clinical Data: Lower back pain, radiating to the left hip.  LUMBAR SPINE - COMPLETE 4+ VIEW  Comparison: None.  Findings: There is no evidence of acute fracture or subluxation. Vertebral bodies demonstrate normal height and alignment.  Mild multilevel disc space narrowing is noted, with scattered associated osteophytes.  The visualized bowel gas pattern is unremarkable in appearance; air and stool are noted within the colon.  The sacroiliac joints are within normal limits.  IMPRESSION:  1.  No evidence of acute fracture or subluxation along the lumbar spine. 2.  Mild degenerative change noted along the lumbar spine.   Original Report Authenticated By: Tonia Ghent, M.D.    IV fentanyl, PO valium  1:47 AM feeling better, able to move with much less pain. 3/10 now  Plan discharge home with RX and back pain precautions - plan close PCP follow up.   MDM  LBP, recent hospitalization for sepsis  Evaluated with labs and imaging  Improved with narcotics and medication as above          Sunnie Nielsen, MD 12/13/12 [phone]

## 2012-12-13 NOTE — ED Notes (Signed)
Pt ambulated down the hall with minimal assistance. No distress noted.

## 2012-12-15 DIAGNOSIS — B9620 Unspecified Escherichia coli [E. coli] as the cause of diseases classified elsewhere: Secondary | ICD-10-CM

## 2012-12-15 DIAGNOSIS — N12 Tubulo-interstitial nephritis, not specified as acute or chronic: Secondary | ICD-10-CM

## 2012-12-15 HISTORY — DX: Tubulo-interstitial nephritis, not specified as acute or chronic: N12

## 2012-12-15 HISTORY — DX: Unspecified Escherichia coli (E. coli) as the cause of diseases classified elsewhere: B96.20

## 2012-12-30 ENCOUNTER — Inpatient Hospital Stay (HOSPITAL_COMMUNITY)
Admission: AD | Admit: 2012-12-30 | Discharge: 2013-01-03 | DRG: 871 | Disposition: A | Discharge status: Home / Self Care | Payer: Medicare Other | Source: Ambulatory Visit | Attending: Internal Medicine | Admitting: Internal Medicine

## 2012-12-30 ENCOUNTER — Encounter (HOSPITAL_COMMUNITY): Payer: Self-pay | Admitting: Family Medicine

## 2012-12-30 ENCOUNTER — Observation Stay (HOSPITAL_COMMUNITY): Payer: Medicare Other

## 2012-12-30 DIAGNOSIS — N12 Tubulo-interstitial nephritis, not specified as acute or chronic: Secondary | ICD-10-CM | POA: Diagnosis present

## 2012-12-30 DIAGNOSIS — E860 Dehydration: Secondary | ICD-10-CM | POA: Diagnosis present

## 2012-12-30 DIAGNOSIS — E876 Hypokalemia: Secondary | ICD-10-CM | POA: Diagnosis present

## 2012-12-30 DIAGNOSIS — R627 Adult failure to thrive: Secondary | ICD-10-CM

## 2012-12-30 DIAGNOSIS — Z87891 Personal history of nicotine dependence: Secondary | ICD-10-CM

## 2012-12-30 DIAGNOSIS — D72829 Elevated white blood cell count, unspecified: Secondary | ICD-10-CM

## 2012-12-30 DIAGNOSIS — M545 Low back pain, unspecified: Secondary | ICD-10-CM

## 2012-12-30 DIAGNOSIS — N390 Urinary tract infection, site not specified: Secondary | ICD-10-CM

## 2012-12-30 DIAGNOSIS — R3989 Other symptoms and signs involving the genitourinary system: Secondary | ICD-10-CM | POA: Diagnosis present

## 2012-12-30 DIAGNOSIS — R7881 Bacteremia: Principal | ICD-10-CM

## 2012-12-30 DIAGNOSIS — J4489 Other specified chronic obstructive pulmonary disease: Secondary | ICD-10-CM | POA: Diagnosis present

## 2012-12-30 DIAGNOSIS — G5620 Lesion of ulnar nerve, unspecified upper limb: Secondary | ICD-10-CM

## 2012-12-30 DIAGNOSIS — J449 Chronic obstructive pulmonary disease, unspecified: Secondary | ICD-10-CM | POA: Diagnosis present

## 2012-12-30 DIAGNOSIS — F17200 Nicotine dependence, unspecified, uncomplicated: Secondary | ICD-10-CM

## 2012-12-30 DIAGNOSIS — B9620 Unspecified Escherichia coli [E. coli] as the cause of diseases classified elsewhere: Secondary | ICD-10-CM | POA: Diagnosis present

## 2012-12-30 DIAGNOSIS — N179 Acute kidney failure, unspecified: Secondary | ICD-10-CM | POA: Diagnosis present

## 2012-12-30 DIAGNOSIS — A498 Other bacterial infections of unspecified site: Secondary | ICD-10-CM

## 2012-12-30 DIAGNOSIS — J189 Pneumonia, unspecified organism: Secondary | ICD-10-CM | POA: Diagnosis present

## 2012-12-30 DIAGNOSIS — Z86010 Personal history of colonic polyps: Secondary | ICD-10-CM

## 2012-12-30 DIAGNOSIS — K219 Gastro-esophageal reflux disease without esophagitis: Secondary | ICD-10-CM

## 2012-12-30 HISTORY — DX: Tubulo-interstitial nephritis, not specified as acute or chronic: N12

## 2012-12-30 HISTORY — DX: Unspecified Escherichia coli (E. coli) as the cause of diseases classified elsewhere: B96.20

## 2012-12-30 LAB — URINALYSIS, ROUTINE W REFLEX MICROSCOPIC
Bilirubin Urine: NEGATIVE
Glucose, UA: NEGATIVE mg/dL
Ketones, ur: NEGATIVE mg/dL
Nitrite: POSITIVE — AB
Protein, ur: 30 mg/dL — AB
Specific Gravity, Urine: 1.01 (ref 1.005–1.030)
Urobilinogen, UA: 1 mg/dL (ref 0.0–1.0)
pH: 6 (ref 5.0–8.0)

## 2012-12-30 LAB — CBC WITH DIFFERENTIAL/PLATELET
Basophils Absolute: 0 10*3/uL (ref 0.0–0.1)
Basophils Relative: 0 % (ref 0–1)
Eosinophils Absolute: 0 10*3/uL (ref 0.0–0.7)
Eosinophils Relative: 0 % (ref 0–5)
HCT: 35 % — ABNORMAL LOW (ref 36.0–46.0)
Hemoglobin: 12.2 g/dL (ref 12.0–15.0)
Lymphocytes Relative: 8 % — ABNORMAL LOW (ref 12–46)
Lymphs Abs: 2 10*3/uL (ref 0.7–4.0)
MCH: 29.9 pg (ref 26.0–34.0)
MCHC: 34.9 g/dL (ref 30.0–36.0)
MCV: 85.8 fL (ref 78.0–100.0)
Monocytes Absolute: 2.5 10*3/uL — ABNORMAL HIGH (ref 0.1–1.0)
Monocytes Relative: 10 % (ref 3–12)
Neutro Abs: 20 10*3/uL — ABNORMAL HIGH (ref 1.7–7.7)
Neutrophils Relative %: 82 % — ABNORMAL HIGH (ref 43–77)
Platelets: 285 10*3/uL (ref 150–400)
RBC: 4.08 MIL/uL (ref 3.87–5.11)
RDW: 14.1 % (ref 11.5–15.5)
Smear Review: ADEQUATE
WBC: 24.5 10*3/uL — ABNORMAL HIGH (ref 4.0–10.5)

## 2012-12-30 LAB — URINE MICROSCOPIC-ADD ON

## 2012-12-30 LAB — COMPREHENSIVE METABOLIC PANEL
ALT: 26 U/L (ref 0–35)
AST: 20 U/L (ref 0–37)
Albumin: 2.3 g/dL — ABNORMAL LOW (ref 3.5–5.2)
Alkaline Phosphatase: 255 U/L — ABNORMAL HIGH (ref 39–117)
BUN: 46 mg/dL — ABNORMAL HIGH (ref 6–23)
CO2: 24 mEq/L (ref 19–32)
Calcium: 8.6 mg/dL (ref 8.4–10.5)
Chloride: 95 mEq/L — ABNORMAL LOW (ref 96–112)
Creatinine, Ser: 2.45 mg/dL — ABNORMAL HIGH (ref 0.50–1.10)
GFR calc Af Amer: 23 mL/min — ABNORMAL LOW (ref >90)
GFR calc non Af Amer: 19 mL/min — ABNORMAL LOW (ref >90)
Glucose, Bld: 106 mg/dL — ABNORMAL HIGH (ref 70–99)
Potassium: 2.7 mEq/L — CL (ref 3.5–5.1)
Sodium: 134 mEq/L — ABNORMAL LOW (ref 135–145)
Total Bilirubin: 0.3 mg/dL (ref 0.3–1.2)
Total Protein: 6.5 g/dL (ref 6.0–8.3)

## 2012-12-30 MED ORDER — ONDANSETRON HCL 4 MG/2ML IJ SOLN: 4.0000 mg | Freq: Four times a day (QID) | INTRAMUSCULAR | Status: DC | PRN

## 2012-12-30 MED ORDER — ALUM & MAG HYDROXIDE-SIMETH 200-200-20 MG/5ML PO SUSP: 30.0000 mL | Freq: Four times a day (QID) | ORAL | Status: DC | PRN

## 2012-12-30 MED ORDER — ACETAMINOPHEN 325 MG PO TABS
650.0000 mg | ORAL_TABLET | Freq: Four times a day (QID) | ORAL | Status: DC | PRN
  Administered 2012-12-31 – 2013-01-03 (×12): 650 mg via ORAL
  Filled 2012-12-30 (×12): qty 2

## 2012-12-30 MED ORDER — SODIUM CHLORIDE 0.9 % IV SOLN: 250.0000 mL | INTRAVENOUS | Status: DC | PRN

## 2012-12-30 MED ORDER — ACETAMINOPHEN 650 MG RE SUPP: 650.0000 mg | Freq: Four times a day (QID) | RECTAL | Status: DC | PRN

## 2012-12-30 MED ORDER — SODIUM CHLORIDE 0.9 % IJ SOLN: 3.0000 mL | INTRAMUSCULAR | Status: DC | PRN

## 2012-12-30 MED ORDER — SODIUM CHLORIDE 0.9 % IJ SOLN
3.0000 mL | Freq: Two times a day (BID) | INTRAMUSCULAR | Status: DC
  Filled 2012-12-30: qty 3

## 2012-12-30 MED ORDER — SODIUM CHLORIDE 0.9 % IV SOLN
INTRAVENOUS | Status: DC
  Administered 2012-12-30 – 2013-01-01 (×6): via INTRAVENOUS

## 2012-12-30 MED ORDER — LEVOFLOXACIN IN D5W 750 MG/150ML IV SOLN
750.0000 mg | INTRAVENOUS | Status: AC
  Administered 2012-12-30 – 2013-01-03 (×5): 750 mg via INTRAVENOUS
  Filled 2012-12-30 (×6): qty 150

## 2012-12-30 MED ORDER — ONDANSETRON HCL 4 MG PO TABS: 4.0000 mg | ORAL_TABLET | Freq: Four times a day (QID) | ORAL | Status: DC | PRN

## 2012-12-30 MED ORDER — POTASSIUM CHLORIDE CRYS ER 20 MEQ PO TBCR
40.0000 meq | EXTENDED_RELEASE_TABLET | Freq: Two times a day (BID) | ORAL | Status: DC
  Administered 2012-12-30 – 2012-12-31 (×3): 40 meq via ORAL
  Filled 2012-12-30 (×2): qty 2
  Filled 2012-12-30: qty 1
  Filled 2012-12-30: qty 2

## 2012-12-30 NOTE — H&P (Signed)
History and Physical  Kayla Sawyer AOZ:[id number] DOB: 01-27-1946 DOA: 12/30/2012  Referring physician: Catalina Pizza, M.D. PCP: Catalina Pizza, MD   Chief Complaint: Generally weak  HPI:  67 year old woman seen in the office today by her primary care physician for persistent complaints of generalized weakness, subjective fever, chills, failure to thrive. Noted to have leukocytosis over the last 2 weeks. Vitals were reported to be stable and direct admission was requested by Dr. Margo Aye for evaluation of generalized weakness and possible dehydration.  History obtained from patient, chart review, husband at bedside. She was admitted approximately one month ago to an outside hospital for 3 days with sepsis, Escherichia coli bacteremia and cystitis as well as pneumonia. She did not require intubation at that time. She was subsequently discharged home on cephalosporin and Zithromax. She saw her primary care physician 5 days later with complaints of low right paravertebral back pain and fluid in the ankles. 2 days later she was seen in the emergency department, had negative lumbar spine films, was treated for back pain and discharged home. She then went to the beach for a week but had such lethargy and poor energy that she did not participate in any activities and spent most of the time in bed. She saw her primary care physician again 6/11 at that time complained of generalized body aches decreased oral intake and generalized lethargy. Vitals were noted to be unremarkable at that time. White blood cell count at that time was 21.4. Complete metabolic panel is unremarkable. She followed up in the office today 6/16 for continuous chills, sweats, poor appetite, change in taste, dizziness and nausea. Vital signs were noted to be unremarkable but because of continuing to feel weak, leukocytosis and her recent illness she was referred for direct admission.  The patient is not feel like she never really recovered after her acute  illness last month. She is able to ambulate, eat and has no cough, shortness of breath, nausea, vomiting or abdominal pain. No rash or other localizing symptoms. She has no dysuria.  Chart Review:  Patient was admitted to [facility] 11/2012 for 3 days with discharge diagnoses being bacterial pneumonia with sepsis, shock, Escherichia coli septicemia and bacteremia, acute cystitis with Escherichia coli. Chest x-ray at that time had a hazy infiltrate in the right side.  Review of Systems:  Positive for subjective fever, shakes, chills, recent rash which has now resolved, whole body aches, taste change, constipation  Negative for visual changes, sore throat, chest pain, shortness of breath, cough, dysuria, bleeding, vomiting, abdominal pain.  Past Medical History  Diagnosis Date  . Sepsis   . Pneumonia   . COPD (chronic obstructive pulmonary disease)     Past Surgical History  Procedure Laterality Date  . Elbow surgery    . Abdominal hysterectomy      partial  . Back surgery      L4/5    Social History:  reports that she has quit smoking. Her smoking use included Cigarettes. She smoked 1.00 pack per day. She does not have any smokeless tobacco history on file. She reports that she does not drink alcohol or use illicit drugs.  No Known Allergies  Family History  Problem Relation Age of Onset  . Diabetes Mother      Prior to Admission medications   Medication Sig Start Date End Date Taking? Authorizing Provider  HYDROcodone-acetaminophen (NORCO/VICODIN) 5-325 MG per tablet Take 1 tablet by mouth every 6 (six) hours as needed for pain. 12/13/12  Yes Sunnie Nielsen, MD  ibuprofen (ADVIL,MOTRIN) 200 MG tablet Take 400-800 mg by mouth every 8 (eight) hours as needed for pain. Patient only took 2 tablets today   Yes Historical Provider, MD  cefUROXime (CEFTIN) 500 MG tablet Take 500 mg by mouth 2 (two) times daily.    Historical Provider, MD   Physical Exam: Filed Vitals:    12/30/12 1402  BP: 96/62  Pulse: 109  Temp: 97.9 F (36.6 C)  TempSrc: Oral  Resp: 20  Height: 5\' 8"  (1.727 m)  Weight: 66.5 kg (146 lb 9.7 oz)  SpO2: 96%   General:  Appears calm and comfortable. Well-appearing. Is pupils round, appear normal, normal lids and irises ENT normal hearing, lips, tongue. Neck no lymphadenopathy or masses. No thyromegaly. Cardiovascular regular rate and rhythm. No murmur, rub, gallop. No lower extremity edema. Respiratory clear to auscultation bilaterally. No wheezes, rales, rhonchi. Normal respiratory effort. Abdomen soft, nontender, nondistended no rebound or guarding. Musculoskeletal back appears unremarkable there is no para vertebral pain of the lumbar spine. Excellent lower extremity strength bilaterally, able to lift each leg off the bed without difficulty. No apparent pain. Excellent tone and strength upper extremities bilaterally. Skin no rash or lesions seen. Psychiatric grossly normal mood and affect. Speech fluent and appropriate. Neurologic grossly nonfocal.  Wt Readings from Last 3 Encounters:  12/30/12 66.5 kg (146 lb 9.7 oz)  07/08/11 67.994 kg (149 lb 14.4 oz)  01/07/10 80.854 kg (178 lb 4 oz)    Labs on Admission:  Basic Metabolic Panel: No results found for this basename: NA, K, CL, CO2, GLUCOSE, BUN, CREATININE, CALCIUM, MG, PHOS,  in the last 168 hours  Liver Function Tests: No results found for this basename: AST, ALT, ALKPHOS, BILITOT, PROT, ALBUMIN,  in the last 168 hours No results found for this basename: LIPASE, AMYLASE,  in the last 168 hours No results found for this basename: AMMONIA,  in the last 168 hours  CBC: No results found for this basename: WBC, NEUTROABS, HGB, HCT, MCV, PLT,  in the last 168 hours  Cardiac Enzymes: No results found for this basename: CKTOTAL, CKMB, CKMBINDEX, TROPONINI,  in the last 168 hours  Troponin (Point of Care Test) No results found for this basename: TROPIPOC,  in the last 72  hours  BNP (last 3 results) No results found for this basename: PROBNP,  in the last 8760 hours  CBG: No results found for this basename: GLUCAP,  in the last 168 hours   Radiological Exams on Admission: No results found.    Active Problems:   LOW BACK PAIN   Failure to thrive in adult   Leukocytosis, unspecified   COPD (chronic obstructive pulmonary disease)   Assessment/Plan: 1. Failure to thrive, generalized body aches, generalized weakness: Etiology unclear. No objective findings on exam. No focal deficits. Screening laboratory studies as below. 2. Recent leukocytosis: Check urinalysis, urine culture, blood cultures, repeat chest x-ray, screening laboratory studies 3. Subacute low back pain, right paravertebral: No neurologic or muscular symptoms. Monitor. Has recently been imaged. 4. History of Escherichia coli sepsis, UTI, septic shock 11/2012  5. Possible pneumonia: X-rays were not conclusive on prior admission and pneumonia would be hard to explain in the context of Escherichia coli bacteremia and UTI. Chest x-ray today does have a mild left lower lobe infiltrate. The previous chest x-ray report from the outside facility notes possible right infiltrate. Given the chronicity of her symptoms we will treat as community-acquired, however having recently been treated  with Rocephin and Zithromax we will start Levaquin.  6. Subclinical COPD: Stable.  Code Status: Full code Family Communication:  discussed with husband and daughter at bedside.  Disposition Plan/Anticipated LOS: Observation, one to 2 days  Time spent: 60 minutes  Brendia Sacks, MD  [facility] Pager [phone] 12/30/2012, 3:11 PM

## 2012-12-31 DIAGNOSIS — N390 Urinary tract infection, site not specified: Secondary | ICD-10-CM | POA: Diagnosis present

## 2012-12-31 DIAGNOSIS — B9620 Unspecified Escherichia coli [E. coli] as the cause of diseases classified elsewhere: Secondary | ICD-10-CM | POA: Diagnosis present

## 2012-12-31 DIAGNOSIS — R7881 Bacteremia: Secondary | ICD-10-CM | POA: Diagnosis present

## 2012-12-31 LAB — BASIC METABOLIC PANEL
BUN: 39 mg/dL — ABNORMAL HIGH (ref 6–23)
CO2: 23 mEq/L (ref 19–32)
Calcium: 8.3 mg/dL — ABNORMAL LOW (ref 8.4–10.5)
Chloride: 102 mEq/L (ref 96–112)
Creatinine, Ser: 2.04 mg/dL — ABNORMAL HIGH (ref 0.50–1.10)
GFR calc Af Amer: 28 mL/min — ABNORMAL LOW (ref >90)
GFR calc non Af Amer: 24 mL/min — ABNORMAL LOW (ref >90)
Glucose, Bld: 111 mg/dL — ABNORMAL HIGH (ref 70–99)
Potassium: 3.9 mEq/L (ref 3.5–5.1)
Sodium: 136 mEq/L (ref 135–145)

## 2012-12-31 LAB — CBC
HCT: 34.2 % — ABNORMAL LOW (ref 36.0–46.0)
Hemoglobin: 11.7 g/dL — ABNORMAL LOW (ref 12.0–15.0)
MCH: 29.4 pg (ref 26.0–34.0)
MCHC: 34.2 g/dL (ref 30.0–36.0)
MCV: 85.9 fL (ref 78.0–100.0)
Platelets: 264 10*3/uL (ref 150–400)
RBC: 3.98 MIL/uL (ref 3.87–5.11)
RDW: 14.3 % (ref 11.5–15.5)
WBC: 22.2 10*3/uL — ABNORMAL HIGH (ref 4.0–10.5)

## 2012-12-31 LAB — STREP PNEUMONIAE URINARY ANTIGEN: Strep Pneumo Urinary Antigen: NEGATIVE

## 2012-12-31 LAB — MAGNESIUM: Magnesium: 1.5 mg/dL (ref 1.5–2.5)

## 2012-12-31 LAB — HIV ANTIBODY (ROUTINE TESTING W REFLEX): HIV: NONREACTIVE

## 2012-12-31 NOTE — Care Management Note (Signed)
    Page 1 of 1   01/03/2013     1:11:47 PM   CARE MANAGEMENT NOTE 01/03/2013  Patient:  Kayla Sawyer, Kayla Sawyer   Account Number:  [id number]  Date Initiated:  12/31/2012  Documentation initiated by:  Sharrie Rothman  Subjective/Objective Assessment:   Pt admitted from home with pneumonia. Pt lives with her husband and will return home at discharge. Pt has a rollator for home use. Pt is fairly indpendent with ADL's.     Action/Plan:   No CM needs noted.   Anticipated DC Date:  01/03/2013   Anticipated DC Plan:  HOME/SELF CARE      DC Planning Services  CM consult      Choice offered to / List presented to:             Status of service:  Completed, signed off Medicare Important Message given?  YES (If response is "NO", the following Medicare IM given date fields will be blank) Date Medicare IM given:  01/03/2013 Date Additional Medicare IM given:    Discharge Disposition:  HOME/SELF CARE  Per UR Regulation:    If discussed at Long Length of Stay Meetings, dates discussed:    Comments:  01/03/13 1310 Arlyss Queen, RN BSN CM Pt discharged home today. No CM needs noted. 12/31/12 1200 Arlyss Queen, RN BSN CM

## 2012-12-31 NOTE — Progress Notes (Signed)
[facility] PROGRESS NOTE  Kayla Sawyer YQM:[id number] DOB: June 22, 1946 DOA: 12/30/2012 PCP: Catalina Pizza, MD  Assessment/Plan: 1. Escherichia coli bacteremia, UTI: Recurrent, highly suggestive of abscess. Only localizing symptom is low back pain. 2. Acute renal failure: Secondary dehydration, failure to thrive. Improving with IV fluids. 3. Low back pain: plan imaging as below 4. Possible left-sided pneumonia: No no pulmonary symptoms. Followup chest x-ray in the morning. Continue empiric antibiotics but this diagnosis is doubted. 5. Hypokalemia, resolved 6. History of Escherichia coli sepsis, UTI, septic shock 11/2012: Treated with cephalosporin. 7. Subclinical COPD: Stable.   Discussed with radiology--CT ab/pelvis with IV contrast preferred to evaluate for abscess and will be adequate to assess spine to assess for discitis/osteomyelitis. Given clinical stability will defer to 6/18 when kidney function is suspected to be adequate.  Followup blood and urine cultures  Continue IV fluids, repeat laboratory studies in the morning  Ambulate as tolerated  Code Status: Full code DVT prophylaxis: SCDs Family Communication: none present  Disposition Plan: home when improved  Brendia Sacks, MD  [facility]  Pager [phone] If 7PM-7AM, please contact night-coverage at [email or web address], password [facility] 12/31/2012, 9:33 AM  LOS: 1 day   Consultants:    Procedures:    Antibiotics:  Levaquin 6/16 >>   HPI/Subjective: She developed some right paravertebral lumbar back pain last night which she thinks may be related to the bed. Dizziness has resolved, she is ambulating without difficulty. Appetite remains poor. No nausea, vomiting or abdominal pain. No cough or shortness of breath. No dysuria. She has no lower extremity paresthesias, pain or weakness. No bowel or bladder dysfunction. No sacral dysesthesia.  Objective: Filed Vitals:   12/30/12 1402 12/30/12 1515  BP: 96/62 96/62   Pulse: 109 109  Temp: 97.9 F (36.6 C) 97.9 F (36.6 C)  TempSrc: Oral Oral  Resp: 20   Height: 5\' 8"  (1.727 m) 5' 8.5" (1.74 m)  Weight: 66.5 kg (146 lb 9.7 oz) 65.772 kg (145 lb)  SpO2: 96% 98%    Intake/Output Summary (Last 24 hours) at 12/31/12 0933 Last data filed at 12/31/12 0432  Gross per 24 hour  Intake    660 ml  Output   1300 ml  Net   -640 ml     Filed Weights   12/30/12 1402 12/30/12 1515  Weight: 66.5 kg (146 lb 9.7 oz) 65.772 kg (145 lb)    Exam:   General: Appears calm and comfortable.  Cardiovascular regular rate and rhythm. No murmur, rub, gallop. No lower extremity edema.  Respiratory clear to auscultation bilaterally. No wheezes, rales, rhonchi. Normal respiratory effort.  Abdomen soft, nontender, nondistended  Data Reviewed:  Creatinine has improved to 2.04 and BUN is trending downwards. Hypokalemia resolved. Magnesium normal. Leukocytosis modestly improved to 22.2. HIV nonreactive. Urinalysis suggestive of infection.  Pending studies:   Urine culture--gram-negative rods  Blood cultures--gram-negative rods  Scheduled Meds: . levofloxacin (LEVAQUIN) IV  750 mg Intravenous Q24H  . potassium chloride  40 mEq Oral BID  . sodium chloride  3 mL Intravenous Q12H   Continuous Infusions: . sodium chloride 150 mL/hr at 12/31/12 0330    Active Problems:   LOW BACK PAIN   Failure to thrive in adult   Leukocytosis, unspecified   COPD (chronic obstructive pulmonary disease)   Time spent 20 minutes

## 2012-12-31 NOTE — Progress Notes (Signed)
UR chart review completed.  

## 2013-01-01 DIAGNOSIS — N179 Acute kidney failure, unspecified: Secondary | ICD-10-CM

## 2013-01-01 LAB — BASIC METABOLIC PANEL
BUN: 27 mg/dL — ABNORMAL HIGH (ref 6–23)
CO2: 19 mEq/L (ref 19–32)
Calcium: 7.9 mg/dL — ABNORMAL LOW (ref 8.4–10.5)
Chloride: 107 mEq/L (ref 96–112)
Creatinine, Ser: 1.42 mg/dL — ABNORMAL HIGH (ref 0.50–1.10)
GFR calc Af Amer: 44 mL/min — ABNORMAL LOW (ref >90)
GFR calc non Af Amer: 38 mL/min — ABNORMAL LOW (ref >90)
Glucose, Bld: 113 mg/dL — ABNORMAL HIGH (ref 70–99)
Potassium: 4.2 mEq/L (ref 3.5–5.1)
Sodium: 138 mEq/L (ref 135–145)

## 2013-01-01 LAB — URINE CULTURE: Colony Count: >=100000

## 2013-01-01 LAB — CBC
HCT: 32.4 % — ABNORMAL LOW (ref 36.0–46.0)
Hemoglobin: 11.2 g/dL — ABNORMAL LOW (ref 12.0–15.0)
MCH: 29.9 pg (ref 26.0–34.0)
MCHC: 34.6 g/dL (ref 30.0–36.0)
MCV: 86.6 fL (ref 78.0–100.0)
Platelets: 241 10*3/uL (ref 150–400)
RBC: 3.74 MIL/uL — ABNORMAL LOW (ref 3.87–5.11)
RDW: 14.6 % (ref 11.5–15.5)
WBC: 19.2 10*3/uL — ABNORMAL HIGH (ref 4.0–10.5)

## 2013-01-01 LAB — LEGIONELLA ANTIGEN, URINE: Legionella Antigen, Urine: NEGATIVE

## 2013-01-01 MED ORDER — POTASSIUM CHLORIDE CRYS ER 20 MEQ PO TBCR
20.0000 meq | EXTENDED_RELEASE_TABLET | Freq: Two times a day (BID) | ORAL | Status: AC
  Administered 2013-01-01: 20 meq via ORAL
  Filled 2013-01-01: qty 1

## 2013-01-01 MED ORDER — VANCOMYCIN HCL IN DEXTROSE 1-5 GM/200ML-% IV SOLN
1000.0000 mg | Freq: Once | INTRAVENOUS | Status: AC
  Administered 2013-01-01: 1000 mg via INTRAVENOUS
  Filled 2013-01-01: qty 200

## 2013-01-01 MED ORDER — POTASSIUM CHLORIDE IN NACL 20-0.9 MEQ/L-% IV SOLN
INTRAVENOUS | Status: DC
  Administered 2013-01-01 – 2013-01-02 (×3): via INTRAVENOUS

## 2013-01-01 MED ORDER — TRAZODONE HCL 50 MG PO TABS
25.0000 mg | ORAL_TABLET | Freq: Every evening | ORAL | Status: DC | PRN
  Administered 2013-01-01: via ORAL
  Administered 2013-01-02: 25 mg via ORAL
  Filled 2013-01-01 (×2): qty 1

## 2013-01-01 MED ORDER — SODIUM CHLORIDE 0.9 % IV SOLN
500.0000 mg | Freq: Two times a day (BID) | INTRAVENOUS | Status: DC
  Administered 2013-01-01 – 2013-01-03 (×4): 500 mg via INTRAVENOUS
  Filled 2013-01-01 (×10): qty 500

## 2013-01-01 NOTE — Evaluation (Signed)
Physical Therapy Evaluation Patient Details Name: Kayla Sawyer MRN: [id number] DOB: 08/17/1945 Today's Date: 01/01/2013 Time: 4098-1191 PT Time Calculation (min): 31 min  PT Assessment / Plan / Recommendation Clinical Impression  Pt was seen for evaluation.  She is mildly deconditioned from baseline, but certainly is at a functional level of mobility.  She reports not eating well because everything she eats tastes like salt.  I'm sure that lack of proper nutrition is exacerbating her general malaise.  Back pain is the other component which limits her and the pain is very specific to the right lower paraspinal region..  She states it is no better since admission.  I have recommended that she ambulate with nursing service at least 3x/day and I will see her tomorrow to have her walk on ramp and stairs.  If she finds that her general endurance is  not improving as her medical status improves, she could benefit from OP PT.    PT Assessment  Patient needs continued PT services    Follow Up Recommendations  No PT follow up (OP PT if pt feels like she needs it)    Does the patient have the potential to tolerate intense rehabilitation      Barriers to Discharge None      Equipment Recommendations  None recommended by PT    Recommendations for Other Services     Frequency Min 1X/week    Precautions / Restrictions Precautions Precautions: None Restrictions Weight Bearing Restrictions: No   Pertinent Vitals/Pain       Mobility  Bed Mobility Bed Mobility: Supine to Sit;Sit to Supine Supine to Sit: 6: Modified independent (Device/Increase time) Sit to Supine: 6: Modified independent (Device/Increase time) Transfers Transfers: Sit to Stand;Stand to Sit Sit to Stand: 7: Independent;Without upper extremity assist;From bed;From toilet Stand to Sit: 7: Independent;Without upper extremity assist;To chair/3-in-1;To bed;To toilet Ambulation/Gait Ambulation/Gait Assistance: 6: Modified  independent (Device/Increase time) Ambulation Distance (Feet): 200 Feet Assistive device: Rolling walker;None Ambulation/Gait Assistance Details: pt is able to ambulate functional distance with no AD, but she had increased back pain with gait and felt that the walker would ease this discomfort. She ambulated about 19' without waker and 125' with walker, stable gait pattern both ways Gait Pattern: Within Functional Limits Gait velocity: WNL Stairs: No Wheelchair Mobility Wheelchair Mobility: No    Exercises     PT Diagnosis: Difficulty walking;Generalized weakness;Acute pain  PT Problem List: Decreased activity tolerance;Decreased mobility;Pain PT Treatment Interventions: Stair training;Patient/family education   PT Goals Acute Rehab PT Goals PT Goal Formulation: With patient/family Time For Goal Achievement: 01/03/13 Potential to Achieve Goals: Good Pt will Go Up / Down Stairs: 3-5 stairs;with modified independence PT Goal: Up/Down Stairs - Progress: Goal set today  Visit Information  Last PT Received On: 01/01/13    Subjective Data  Subjective: I just haven't felt well Patient Stated Goal: wants to feel better   Prior Functioning  Home Living Lives With: Spouse Available Help at Discharge: Family;Available 24 hours/day Type of Home: House Home Access: Stairs to enter [employer] of Steps: 2 Entrance Stairs-Rails: None Home Layout: Multi-level Alternate Level Stairs-Number of Steps: 2 steps up to den, 2 steps down to another den Alternate Level Stairs-Rails: None Bathroom Shower/Tub: Engineer, manufacturing systems: Standard Home Adaptive Equipment: Environmental consultant - four wheeled Prior Function Level of Independence: Independent (uses rollator for long distance of walking) Able to Take Stairs?: Yes Driving: Yes Vocation: Retired Musician: No difficulties    Copywriter, advertising  Arousal/Alertness: Awake/alert Behavior During Therapy: WFL for  tasks assessed/performed Overall Cognitive Status: Within Functional Limits for tasks assessed    Extremity/Trunk Assessment Right Lower Extremity Assessment RLE ROM/Strength/Tone: Within functional levels RLE Sensation: WFL - Light Touch RLE Coordination: WFL - gross motor Left Lower Extremity Assessment LLE ROM/Strength/Tone: Within functional levels LLE Sensation: WFL - Light Touch LLE Coordination: WFL - gross motor Trunk Assessment Trunk Assessment: Normal   Balance Balance Balance Assessed: No (WNL by functional observation)  End of Session PT - End of Session Equipment Utilized During Treatment: Gait belt Activity Tolerance: Patient tolerated treatment well Patient left: in chair;with call bell/phone within reach;with family/visitor present  GP     Konrad Penta 01/01/2013, 2:20 PM

## 2013-01-01 NOTE — Clinical Documentation Improvement (Signed)
SEPSIS DOCUMENTATION QUERY  THIS DOCUMENT IS NOT A PERMANENT PART OF THE MEDICAL RECORD  TO RESPOND TO THE THIS QUERY, FOLLOW THE INSTRUCTIONS BELOW:  1. If needed, update documentation for the patient's encounter via the notes activity.  2. Access this query again and click edit on the In [employer].  3. After updating, or not, click F2 to complete all highlighted (required) fields concerning your review. Select "additional documentation in the medical record" OR "no additional documentation provided".  4. Click Sign note button.  5. The deficiency will fall out of your In Basket *Please let us know if you are not able to complete this workflow by phone or e-mail (listed below).  Please update your documentation within the medical record to reflect your response to this query.                                                                                    01/01/13  Dear Dr. Sherrie Mustache Marton Redwood,  In a better effort to capture your patient's severity of illness, reflect appropriate length of stay and utilization of resources, a review of the patient medical record has revealed the following indicators.   Based on your clinical judgment, please clarify and document in a progress note and/or discharge summary the clinical condition associated with the following supporting information: In responding to this query please exercise your independent judgment.  The fact that a query is asked, does not imply that any particular answer is desired or expected.   Possible Clinical Conditions?  Septicemia / Sepsis Severe Sepsis SIRS Sepsis with UTI Other Condition __________________ Cannot clinically Determine _______________  Risk Factors: Recurrent Bacteremia and UTI Pneumonia Acute renal failure  Low blood pressure 96/62 on admission WBC = 24.5 with Left shift noted   Blood culture: Gram + Cocci (prelim results)  Treatment: Levaquin IV 750mg  q24h Vancomycin 1gm IV then 500mg   q12h Sputum culture, blood culture, urine culture and gram stain ordered  Reviewed: Answer found in 6/19 pn = E coli uti w/o sepsis or septicemia by Dr. Sherrie Mustache Thank You,  Harless Litten RN, MSN Clinical Documentation Specialist: Office# [phone] [facility] [place]

## 2013-01-01 NOTE — Progress Notes (Signed)
UR Chart Review Completed  

## 2013-01-01 NOTE — Progress Notes (Signed)
ANTIBIOTIC CONSULT NOTE - INITIAL  Pharmacy Consult for Vancomycin Indication: rule out pneumonia  No Known Allergies  Patient Measurements: Height: 5' 8.5" (174 cm) Weight: 145 lb (65.772 kg) IBW/kg (Calculated) : 65.05  Vital Signs: Temp: 97.9 F (36.6 C) (06/18 0700) Temp src: Oral (06/18 0700) BP: 124/71 mmHg (06/18 0700) Pulse Rate: 73 (06/18 0700) Intake/Output from previous day: 06/17 0701 - 06/18 0700 In: 5917.5 [P.O.:380; I.V.:5537.5] Out: 1800 [Urine:1800] Intake/Output from this shift: Total I/O In: 60 [P.O.:60] Out: -   Labs:  Recent Labs  12/30/12 1600 12/31/12 0511 01/01/13 0441  WBC 24.5* 22.2* 19.2*  HGB 12.2 11.7* 11.2*  PLT 285 264 241  CREATININE 2.45* 2.04* 1.42*   Estimated Creatinine Clearance: 40.1 ml/min (by C-G formula based on Cr of 1.42). No results found for this basename: VANCOTROUGH, VANCOPEAK, VANCORANDOM, GENTTROUGH, GENTPEAK, GENTRANDOM, TOBRATROUGH, TOBRAPEAK, TOBRARND, AMIKACINPEAK, AMIKACINTROU, AMIKACIN,  in the last 72 hours   Microbiology: Recent Results (from the past 720 hour(s))  CULTURE, BLOOD (ROUTINE X 2)     Status: None   Collection Time    12/30/12  4:04 PM      Result Value Range Status   Specimen Description BLOOD RIGHT ANTECUBITAL   Final   Special Requests     Final   Value: BOTTLES DRAWN AEROBIC AND ANAEROBIC AEB=10CC ANA=12CC   Culture  Setup Time 12/31/2012 13:41   Final   Culture     Final   Value: GRAM NEGATIVE RODS     Note: Gram Stain Report Called to,Read Back By and Verified With: Linwood Dibbles RN AT 0600 12/31/12 BY RESSEGGER R Performed at [facility]   Report Status PENDING   Incomplete  CULTURE, BLOOD (ROUTINE X 2)     Status: None   Collection Time    12/30/12  4:05 PM      Result Value Range Status   Specimen Description BLOOD LEFT ANTECUBITAL   Final   Special Requests BOTTLES DRAWN AEROBIC AND ANAEROBIC 10CC   Final   Culture  Setup Time 12/31/2012 13:41   Final   Culture      Final   Value: GRAM NEGATIVE RODS     Note: Gram Stain Report Called to,Read Back By and Verified With: Linwood Dibbles RN AT 0600 12/31/12 BY RESSEGGER R Performed at [facility]   Report Status PENDING   Incomplete  URINE CULTURE     Status: None   Collection Time    12/30/12  6:30 PM      Result Value Range Status   Specimen Description URINE, CLEAN CATCH   Final   Special Requests NONE   Final   Culture  Setup Time 12/30/2012 20:30   Final   Colony Count >=100,000 COLONIES/ML   Final   Culture ESCHERICHIA COLI   Final   Report Status 01/01/2013 FINAL   Final   Organism ID, Bacteria ESCHERICHIA COLI   Final    Medical History: Past Medical History  Diagnosis Date  . Sepsis   . Pneumonia   . COPD (chronic obstructive pulmonary disease)     Medications:  Scheduled:  . levofloxacin (LEVAQUIN) IV  750 mg Intravenous Q24H  . potassium chloride  20 mEq Oral BID  . sodium chloride  3 mL Intravenous Q12H  . vancomycin  1,000 mg Intravenous Once   Followed by  . vancomycin  500 mg Intravenous Q12H   Assessment: 67 yo F currently on day#3 Levaquin for E.coli urosepsis (sensitivities pending).  Patient is afebrile and WBC trending down.   Her renal function is improving, but still elevated above her baseline (baseline Scr ~0.7).   New orders to start on Vancomycin for possible PNA.   Goal of Therapy:  Vancomycin trough level 15-20 mcg/ml  Plan:  1) Continue Levaquin 750mg  IV Q24h (renal function borderline for dose decrease, but continue as ordered since renal fxn improving) 2) Vancomycin 1gm IV loading dose then 500mg  IV Q12h 3) Check Vancomycin trough at steady state 4) Monitor renal function and cx data  5) Duration of therapy per MD  Elson Clan 01/01/2013,9:14 AM

## 2013-01-01 NOTE — Progress Notes (Signed)
Subjective: The patient complains of mild right greater than left lower back pain. At the moment, Tylenol is requested, nothing stronger. She denies chest pain, dizziness, nausea, vomiting, or abdominal pain. Overall, she feels fatigued and generally weak.  Objective: Vital signs in last 24 hours: Filed Vitals:   12/30/12 1515 12/31/12 1459 01/01/13 0606 01/01/13 0700  BP: 96/62 129/49 122/65 124/71  Pulse: 109 97 76 73  Temp: 97.9 F (36.6 C) 98.9 F (37.2 C) 97.9 F (36.6 C) 97.9 F (36.6 C)  TempSrc: Oral  Oral Oral  Resp:  20 20   Height: 5' 8.5" (1.74 m)     Weight: 65.772 kg (145 lb)     SpO2: 98% 97% 94% 97%    Intake/Output Summary (Last 24 hours) at 01/01/13 1115 Last data filed at 01/01/13 0900  Gross per 24 hour  Intake 5857.5 ml  Output   1500 ml  Net 4357.5 ml    Weight change:   Physical exam: General: Pleasant overweight 67 year old Caucasian woman laying in bed, in no acute distress. Lungs: Clear anteriorly with decreased breath sounds in the bases. Heart: S1, S2, with a soft systolic murmur. Abdomen: Mildly obese, positive bowel sounds, soft, nontender, nondistended. Back: Mild tenderness to palpation of the right and mid lumbosacral muscles without spasm or fluctuance or erythema. Extremities: No pedal edema. Neurologic: She is able to raise both of her legs against gravity approximately 35. Sensation of her lower extremities intact. Cranial nerves II through XII are intact.    Lab Results: Basic Metabolic Panel:  Recent Labs  16/10/96 1600 12/31/12 0511 01/01/13 0441  NA 134* 136 138  K 2.7* 3.9 4.2  CL 95* 102 107  CO2 24 23 19   GLUCOSE 106* 111* 113*  BUN 46* 39* 27*  CREATININE 2.45* 2.04* 1.42*  CALCIUM 8.6 8.3* 7.9*  MG  --  1.5  --    Liver Function Tests:  Recent Labs  12/30/12 1600  AST 20  ALT 26  ALKPHOS 255*  BILITOT 0.3  PROT 6.5  ALBUMIN 2.3*   No results found for this basename: LIPASE, AMYLASE,  in the last 72  hours No results found for this basename: AMMONIA,  in the last 72 hours CBC:  Recent Labs  12/30/12 1600 12/31/12 0511 01/01/13 0441  WBC 24.5* 22.2* 19.2*  NEUTROABS 20.0*  --   --   HGB 12.2 11.7* 11.2*  HCT 35.0* 34.2* 32.4*  MCV 85.8 85.9 86.6  PLT 285 264 241   Cardiac Enzymes: No results found for this basename: CKTOTAL, CKMB, CKMBINDEX, TROPONINI,  in the last 72 hours BNP: No results found for this basename: PROBNP,  in the last 72 hours D-Dimer: No results found for this basename: DDIMER,  in the last 72 hours CBG: No results found for this basename: GLUCAP,  in the last 72 hours Hemoglobin A1C: No results found for this basename: HGBA1C,  in the last 72 hours Fasting Lipid Panel: No results found for this basename: CHOL, HDL, LDLCALC, TRIG, CHOLHDL, LDLDIRECT,  in the last 72 hours Thyroid Function Tests: No results found for this basename: TSH, T4TOTAL, FREET4, T3FREE, THYROIDAB,  in the last 72 hours Anemia Panel: No results found for this basename: VITAMINB12, FOLATE, FERRITIN, TIBC, IRON, RETICCTPCT,  in the last 72 hours Coagulation: No results found for this basename: LABPROT, INR,  in the last 72 hours Urine Drug Screen: Drugs of Abuse  No results found for this basename: labopia,  cocainscrnur,  labbenz,  amphetmu,  thcu,  labbarb    Alcohol Level: No results found for this basename: ETH,  in the last 72 hours Urinalysis:  Recent Labs  12/30/12 1830  COLORURINE YELLOW  LABSPEC 1.010  PHURINE 6.0  GLUCOSEU NEGATIVE  HGBUR LARGE*  BILIRUBINUR NEGATIVE  KETONESUR NEGATIVE  PROTEINUR 30*  UROBILINOGEN 1.0  NITRITE POSITIVE*  LEUKOCYTESUR SMALL*   Misc. Labs:   Micro: Recent Results (from the past 240 hour(s))  CULTURE, BLOOD (ROUTINE X 2)     Status: None   Collection Time    12/30/12  4:04 PM      Result Value Range Status   Specimen Description BLOOD RIGHT ANTECUBITAL   Final   Special Requests     Final   Value: BOTTLES DRAWN  AEROBIC AND ANAEROBIC AEB=10CC ANA=12CC   Culture  Setup Time 12/31/2012 13:41   Final   Culture     Final   Value: GRAM NEGATIVE RODS     Note: Gram Stain Report Called to,Read Back By and Verified With: Linwood Dibbles RN AT 0600 12/31/12 BY RESSEGGER R Performed at [facility]   Report Status PENDING   Incomplete  CULTURE, BLOOD (ROUTINE X 2)     Status: None   Collection Time    12/30/12  4:05 PM      Result Value Range Status   Specimen Description BLOOD LEFT ANTECUBITAL   Final   Special Requests BOTTLES DRAWN AEROBIC AND ANAEROBIC 10CC   Final   Culture  Setup Time 12/31/2012 13:41   Final   Culture     Final   Value: GRAM NEGATIVE RODS     Note: Gram Stain Report Called to,Read Back By and Verified With: Linwood Dibbles RN AT 0600 12/31/12 BY RESSEGGER R Performed at [facility]   Report Status PENDING   Incomplete  URINE CULTURE     Status: None   Collection Time    12/30/12  6:30 PM      Result Value Range Status   Specimen Description URINE, CLEAN CATCH   Final   Special Requests NONE   Final   Culture  Setup Time 12/30/2012 20:30   Final   Colony Count >=100,000 COLONIES/ML   Final   Culture ESCHERICHIA COLI   Final   Report Status 01/01/2013 FINAL   Final   Organism ID, Bacteria ESCHERICHIA COLI   Final    Studies/Results: Dg Chest 2 View  12/30/2012   *RADIOLOGY REPORT*  Clinical Data: Recent pneumonia for follow-up  CHEST - 2 VIEW  Comparison: 07/10/2011  Findings: The heart size and vascular pattern are normal. Hyperinflation indicates COPD. There is mild interstitial change at both lung bases. There is mild posterior left lower lobe opacity suggesting possibility of pneumonia.  IMPRESSION:  COPD.  Consistent with history, mild left lower lobe infiltrate.   Original Report Authenticated By: Esperanza Heir, M.D.    Medications:  Scheduled: . levofloxacin (LEVAQUIN) IV  750 mg Intravenous Q24H  . sodium chloride  3 mL Intravenous Q12H  . vancomycin   1,000 mg Intravenous Once   Followed by  . vancomycin  500 mg Intravenous Q12H   Continuous: . 0.9 % NaCl with KCl 20 mEq / L 100 mL/hr at 01/01/13 9147   WGN:FAOZHY chloride, acetaminophen, acetaminophen, alum & mag hydroxide-simeth, ondansetron (ZOFRAN) IV, ondansetron, sodium chloride  Assessment: Principal Problem:   E coli bacteremia Active Problems:   LOW BACK PAIN   Failure to thrive in adult  Leukocytosis, unspecified   COPD (chronic obstructive pulmonary disease)   E. coli UTI   Acute renal failure   1. Escherichia coli bacteremia and Escherichia coli urinary tract infection. Apparently, she was treated in an outside hospital for the same. This is either a reinfection or persistence of the previous infection. Her white blood cell count is decreasing. She is afebrile. Sensitivities/culture reveals Levaquin adequately covers treatment for this Escherichia coli. However, given her previous hospitalization, would favor adding vancomycin empirically.  Acute renal failure. Likely secondary to prerenal azotemia/dehydration. Her renal function is improving.  Query mild left lower lobe pneumonia per chest x-ray. As above, we'll add vancomycin, although she has no pulmonary symptomatology.  COPD. Stable.  Low back pain. Per Dr. Irene Limbo, CT scan of the abdomen and pelvis with contrast was to going to be ordered, but we are awaiting normalization of her renal function. Agree that it should be ordered to assess for vertebral discitis or osteomyelitis or pyelonephritis.  Hypokalemia. Repleting orally and in the IV fluids.  Generalized weakness/L. failure to thrive. Secondary to the above. We'll order a physical therapy consultation.   Plan:  1. Added vancomycin for broader coverage as stated above. 2. Physical therapy consultation and evaluation. Out of bed to the chair. 3. Continue supportive treatment. 4. As her renal function improves, will hopefully be able to order CT scan  of her abdomen and pelvis tomorrow. 5. Discussed with her husband.   LOS: 2 days   Skarlet Lyons 01/01/2013, 11:15 AM

## 2013-01-02 ENCOUNTER — Inpatient Hospital Stay (HOSPITAL_COMMUNITY): Payer: Medicare Other

## 2013-01-02 ENCOUNTER — Encounter (HOSPITAL_COMMUNITY): Payer: Self-pay | Admitting: Radiology

## 2013-01-02 LAB — BASIC METABOLIC PANEL
BUN: 19 mg/dL (ref 6–23)
CO2: 20 mEq/L (ref 19–32)
Calcium: 8.2 mg/dL — ABNORMAL LOW (ref 8.4–10.5)
Chloride: 106 mEq/L (ref 96–112)
Creatinine, Ser: 1.25 mg/dL — ABNORMAL HIGH (ref 0.50–1.10)
GFR calc Af Amer: 51 mL/min — ABNORMAL LOW (ref >90)
GFR calc non Af Amer: 44 mL/min — ABNORMAL LOW (ref >90)
Glucose, Bld: 100 mg/dL — ABNORMAL HIGH (ref 70–99)
Potassium: 4.4 mEq/L (ref 3.5–5.1)
Sodium: 138 mEq/L (ref 135–145)

## 2013-01-02 LAB — CULTURE, BLOOD (ROUTINE X 2)

## 2013-01-02 LAB — CBC
HCT: 32.4 % — ABNORMAL LOW (ref 36.0–46.0)
Hemoglobin: 11.1 g/dL — ABNORMAL LOW (ref 12.0–15.0)
MCH: 29.6 pg (ref 26.0–34.0)
MCHC: 34.3 g/dL (ref 30.0–36.0)
MCV: 86.4 fL (ref 78.0–100.0)
Platelets: 255 10*3/uL (ref 150–400)
RBC: 3.75 MIL/uL — ABNORMAL LOW (ref 3.87–5.11)
RDW: 14.6 % (ref 11.5–15.5)
WBC: 17.7 10*3/uL — ABNORMAL HIGH (ref 4.0–10.5)

## 2013-01-02 MED ORDER — IOHEXOL 300 MG/ML  SOLN
50.0000 mL | Freq: Once | INTRAMUSCULAR | Status: AC | PRN
  Administered 2013-01-02: 50 mL via ORAL

## 2013-01-02 MED ORDER — IOHEXOL 300 MG/ML  SOLN
100.0000 mL | Freq: Once | INTRAMUSCULAR | Status: AC | PRN
  Administered 2013-01-02: 100 mL via INTRAVENOUS

## 2013-01-02 NOTE — Progress Notes (Signed)
PT Cancellation Note  Patient Details Name: Kayla Sawyer MRN: [id number] DOB: 1946-05-06   Cancelled Treatment:    Reason Eval/Treat Not Completed: Other (comment) Pt states that she had just returned to the bed from walking.  She said that she walked up and down the hallway and even on the ramp...did very well with less fatigue.  We will try to do steps tomorrow.  [name] L 01/02/2013, 3:45 PM

## 2013-01-02 NOTE — Progress Notes (Signed)
Subjective: The patient complains of mild right greater than left lower back pain, but slightly less than yesterday. Overall, she feels a little bit better.  Objective: Vital signs in last 24 hours: Filed Vitals:   01/01/13 0700 01/01/13 1400 01/01/13 2040 01/02/13 0605  BP: 124/71 112/71 133/50 121/46  Pulse: 73 92 80 75  Temp: 97.9 F (36.6 C) 98.1 F (36.7 C) 98.1 F (36.7 C) 98 F (36.7 C)  TempSrc: Oral  Oral Oral  Resp:  20 20 18   Height:      Weight:      SpO2: 97% 98% 96% 96%    Intake/Output Summary (Last 24 hours) at 01/02/13 0936 Last data filed at 01/01/13 2100  Gross per 24 hour  Intake   1692 ml  Output   2200 ml  Net   -508 ml    Weight change:   Physical exam: General: Pleasant overweight 67 year old Caucasian woman laying in bed, in no acute distress. Lungs: Clear anteriorly with decreased breath sounds in the bases. Heart: S1, S2, with a soft systolic murmur. Abdomen: Mildly obese, positive bowel sounds, soft, nontender, nondistended. Back: Mild tenderness to palpation of the right and mid lumbosacral muscles without spasm or fluctuance or erythema. Extremities: No pedal edema. Neurologic: She is able to raise both of her legs against gravity approximately 35. Sensation of her lower extremities intact. Cranial nerves II through XII are intact.    Lab Results: Basic Metabolic Panel:  Recent Labs  09/81/19 1600 12/31/12 0511 01/01/13 0441 01/02/13 0517  NA 134* 136 138 138  K 2.7* 3.9 4.2 4.4  CL 95* 102 107 106  CO2 24 23 19 20   GLUCOSE 106* 111* 113* 100*  BUN 46* 39* 27* 19  CREATININE 2.45* 2.04* 1.42* 1.25*  CALCIUM 8.6 8.3* 7.9* 8.2*  MG  --  1.5  --   --    Liver Function Tests:  Recent Labs  12/30/12 1600  AST 20  ALT 26  ALKPHOS 255*  BILITOT 0.3  PROT 6.5  ALBUMIN 2.3*   No results found for this basename: LIPASE, AMYLASE,  in the last 72 hours No results found for this basename: AMMONIA,  in the last 72  hours CBC:  Recent Labs  12/30/12 1600  01/01/13 0441 01/02/13 0517  WBC 24.5*  < > 19.2* 17.7*  NEUTROABS 20.0*  --   --   --   HGB 12.2  < > 11.2* 11.1*  HCT 35.0*  < > 32.4* 32.4*  MCV 85.8  < > 86.6 86.4  PLT 285  < > 241 255  < > = values in this interval not displayed. Cardiac Enzymes: No results found for this basename: CKTOTAL, CKMB, CKMBINDEX, TROPONINI,  in the last 72 hours BNP: No results found for this basename: PROBNP,  in the last 72 hours D-Dimer: No results found for this basename: DDIMER,  in the last 72 hours CBG: No results found for this basename: GLUCAP,  in the last 72 hours Hemoglobin A1C: No results found for this basename: HGBA1C,  in the last 72 hours Fasting Lipid Panel: No results found for this basename: CHOL, HDL, LDLCALC, TRIG, CHOLHDL, LDLDIRECT,  in the last 72 hours Thyroid Function Tests: No results found for this basename: TSH, T4TOTAL, FREET4, T3FREE, THYROIDAB,  in the last 72 hours Anemia Panel: No results found for this basename: VITAMINB12, FOLATE, FERRITIN, TIBC, IRON, RETICCTPCT,  in the last 72 hours Coagulation: No results found for this basename: LABPROT,  INR,  in the last 72 hours Urine Drug Screen: Drugs of Abuse  No results found for this basename: labopia,  cocainscrnur,  labbenz,  amphetmu,  thcu,  labbarb    Alcohol Level: No results found for this basename: ETH,  in the last 72 hours Urinalysis:  Recent Labs  12/30/12 1830  COLORURINE YELLOW  LABSPEC 1.010  PHURINE 6.0  GLUCOSEU NEGATIVE  HGBUR LARGE*  BILIRUBINUR NEGATIVE  KETONESUR NEGATIVE  PROTEINUR 30*  UROBILINOGEN 1.0  NITRITE POSITIVE*  LEUKOCYTESUR SMALL*   Misc. Labs:   Micro: Recent Results (from the past 240 hour(s))  CULTURE, BLOOD (ROUTINE X 2)     Status: None   Collection Time    12/30/12  4:04 PM      Result Value Range Status   Specimen Description BLOOD RIGHT ANTECUBITAL   Final   Special Requests     Final   Value: BOTTLES DRAWN  AEROBIC AND ANAEROBIC AEB=10CC ANA=12CC   Culture  Setup Time 12/31/2012 13:41   Final   Culture     Final   Value: ESCHERICHIA COLI     Note: Gram Stain Report Called to,Read Back By and Verified With: Linwood Dibbles RN AT 0600 12/31/12 BY RESSEGGER R Performed at [facility]   Report Status 01/02/2013 FINAL   Final   Organism ID, Bacteria ESCHERICHIA COLI   Final  CULTURE, BLOOD (ROUTINE X 2)     Status: None   Collection Time    12/30/12  4:05 PM      Result Value Range Status   Specimen Description BLOOD LEFT ANTECUBITAL   Final   Special Requests BOTTLES DRAWN AEROBIC AND ANAEROBIC 10CC   Final   Culture  Setup Time 12/31/2012 13:41   Final   Culture     Final   Value: ESCHERICHIA COLI     Note: SUSCEPTIBILITIES PERFORMED ON PREVIOUS CULTURE WITHIN THE LAST 5 DAYS.     Note: Gram Stain Report Called to,Read Back By and Verified With: Linwood Dibbles RN AT 0600 12/31/12 BY RESSEGGER R Performed at [facility]   Report Status 01/02/2013 FINAL   Final  URINE CULTURE     Status: None   Collection Time    12/30/12  6:30 PM      Result Value Range Status   Specimen Description URINE, CLEAN CATCH   Final   Special Requests NONE   Final   Culture  Setup Time 12/30/2012 20:30   Final   Colony Count >=100,000 COLONIES/ML   Final   Culture ESCHERICHIA COLI   Final   Report Status 01/01/2013 FINAL   Final   Organism ID, Bacteria ESCHERICHIA COLI   Final    Studies/Results: No results found.  Medications:  Scheduled: . levofloxacin (LEVAQUIN) IV  750 mg Intravenous Q24H  . sodium chloride  3 mL Intravenous Q12H  . vancomycin  500 mg Intravenous Q12H   Continuous: . 0.9 % NaCl with KCl 20 mEq / L 100 mL/hr at 01/02/13 1610   RUE:AVWUJW chloride, acetaminophen, acetaminophen, alum & mag hydroxide-simeth, ondansetron (ZOFRAN) IV, ondansetron, sodium chloride, traZODone  Assessment: Principal Problem:   E coli bacteremia Active Problems:   LOW BACK PAIN   Failure to  thrive in adult   Leukocytosis, unspecified   COPD (chronic obstructive pulmonary disease)   E. coli UTI   Acute renal failure   1. Escherichia coli bacteremia and Escherichia coli urinary tract infection, without sepsis or septicemia. Apparently, she was treated  in an outside hospital for the same. This is either a reinfection or persistence of the previous infection. Her white blood cell count is decreasing. She is afebrile. Sensitivities/culture reveals Levaquin adequately covers treatment for this Escherichia coli. However, given her previous hospitalization, vancomycin was added on 01/01/2013.  Acute renal failure. Likely secondary to prerenal azotemia/dehydration. Her renal function is improving.  Query mild left lower lobe pneumonia per chest x-ray. As above,  vancomycin was added, although she has no pulmonary symptomatology.  COPD. Stable.  Low back pain. Per Dr. Irene Limbo, CT scan of the abdomen and pelvis with contrast was to going to be ordered, but we are awaiting normalization of her renal function. Agree that it should be ordered to assess for vertebral discitis or osteomyelitis or pyelonephritis. Now that her renal function has improved, we'll go head and ordered a CT with contrast.  Hypokalemia. Repleting orally and in the IV fluids.  Generalized weakness/mild failure to thrive. Secondary to the above. Physical therapy evaluation and recommendations noted and appreciated.   Plan:  1. We'll order CT scan of the abdomen and pelvis to rule out pyelonephritis, vertebral discitis or osteomyelitis or to rule out severe degenerative joint disease of the spine. 2. Continue supportive treatment. 3. Continue progressive mobilization.   LOS: 3 days   Kayla Sawyer 01/02/2013, 9:36 AM

## 2013-01-03 ENCOUNTER — Encounter (HOSPITAL_COMMUNITY): Payer: Self-pay | Admitting: Internal Medicine

## 2013-01-03 DIAGNOSIS — B9620 Unspecified Escherichia coli [E. coli] as the cause of diseases classified elsewhere: Secondary | ICD-10-CM | POA: Diagnosis present

## 2013-01-03 DIAGNOSIS — N12 Tubulo-interstitial nephritis, not specified as acute or chronic: Secondary | ICD-10-CM

## 2013-01-03 LAB — BASIC METABOLIC PANEL
BUN: 15 mg/dL (ref 6–23)
CO2: 23 mEq/L (ref 19–32)
Calcium: 8.5 mg/dL (ref 8.4–10.5)
Chloride: 102 mEq/L (ref 96–112)
Creatinine, Ser: 1.21 mg/dL — ABNORMAL HIGH (ref 0.50–1.10)
GFR calc Af Amer: 53 mL/min — ABNORMAL LOW (ref >90)
GFR calc non Af Amer: 46 mL/min — ABNORMAL LOW (ref >90)
Glucose, Bld: 98 mg/dL (ref 70–99)
Potassium: 4 mEq/L (ref 3.5–5.1)
Sodium: 137 mEq/L (ref 135–145)

## 2013-01-03 LAB — CBC
HCT: 32.9 % — ABNORMAL LOW (ref 36.0–46.0)
Hemoglobin: 11.3 g/dL — ABNORMAL LOW (ref 12.0–15.0)
MCH: 29.7 pg (ref 26.0–34.0)
MCHC: 34.3 g/dL (ref 30.0–36.0)
MCV: 86.6 fL (ref 78.0–100.0)
Platelets: 288 10*3/uL (ref 150–400)
RBC: 3.8 MIL/uL — ABNORMAL LOW (ref 3.87–5.11)
RDW: 14.7 % (ref 11.5–15.5)
WBC: 14.9 10*3/uL — ABNORMAL HIGH (ref 4.0–10.5)

## 2013-01-03 LAB — VANCOMYCIN, TROUGH: Vancomycin Tr: 14.6 ug/mL (ref 10.0–20.0)

## 2013-01-03 MED ORDER — LEVOFLOXACIN 750 MG PO TABS: 750.0000 mg | ORAL_TABLET | Freq: Every day | ORAL | Status: DC

## 2013-01-03 NOTE — Progress Notes (Addendum)
ANTIBIOTIC CONSULT NOTE  Pharmacy Consult for Vancomycin Indication: rule out pneumonia  No Known Allergies  Patient Measurements: Height: 5' 8.5" (174 cm) Weight: 145 lb (65.772 kg) IBW/kg (Calculated) : 65.05  Vital Signs: Temp: 98.7 F (37.1 C) (06/20 1610) Temp src: Oral (06/20 9604) BP: 136/54 mmHg (06/20 5409) Pulse Rate: 76 (06/20 8119) Intake/Output from previous day: 06/19 0701 - 06/20 0700 In: 4316 [P.O.:1970; I.V.:2346] Out: 3925 [Urine:3925] Intake/Output from this shift:    Labs:  Recent Labs  01/01/13 0441 01/02/13 0517 01/03/13 0509  WBC 19.2* 17.7* 14.9*  HGB 11.2* 11.1* 11.3*  PLT 241 255 288  CREATININE 1.42* 1.25* 1.21*   Estimated Creatinine Clearance: 47 ml/min (by C-G formula based on Cr of 1.21).  Recent Labs  01/03/13 0827  VANCOTROUGH 14.6     Microbiology: Recent Results (from the past 720 hour(s))  CULTURE, BLOOD (ROUTINE X 2)     Status: None   Collection Time    12/30/12  4:04 PM      Result Value Range Status   Specimen Description BLOOD RIGHT ANTECUBITAL   Final   Special Requests     Final   Value: BOTTLES DRAWN AEROBIC AND ANAEROBIC AEB=10CC ANA=12CC   Culture  Setup Time 12/31/2012 13:41   Final   Culture     Final   Value: ESCHERICHIA COLI     Note: Gram Stain Report Called to,Read Back By and Verified With: Linwood Dibbles RN AT 0600 12/31/12 BY RESSEGGER R Performed at [facility]   Report Status 01/02/2013 FINAL   Final   Organism ID, Bacteria ESCHERICHIA COLI   Final  CULTURE, BLOOD (ROUTINE X 2)     Status: None   Collection Time    12/30/12  4:05 PM      Result Value Range Status   Specimen Description BLOOD LEFT ANTECUBITAL   Final   Special Requests BOTTLES DRAWN AEROBIC AND ANAEROBIC 10CC   Final   Culture  Setup Time 12/31/2012 13:41   Final   Culture     Final   Value: ESCHERICHIA COLI     Note: SUSCEPTIBILITIES PERFORMED ON PREVIOUS CULTURE WITHIN THE LAST 5 DAYS.     Note: Gram Stain Report  Called to,Read Back By and Verified With: Linwood Dibbles RN AT 0600 12/31/12 BY RESSEGGER R Performed at [facility]   Report Status 01/02/2013 FINAL   Final  URINE CULTURE     Status: None   Collection Time    12/30/12  6:30 PM      Result Value Range Status   Specimen Description URINE, CLEAN CATCH   Final   Special Requests NONE   Final   Culture  Setup Time 12/30/2012 20:30   Final   Colony Count >=100,000 COLONIES/ML   Final   Culture ESCHERICHIA COLI   Final   Report Status 01/01/2013 FINAL   Final   Organism ID, Bacteria ESCHERICHIA COLI   Final    Medical History: Past Medical History  Diagnosis Date  . Sepsis   . Pneumonia   . COPD (chronic obstructive pulmonary disease)     Medications:  Scheduled:  . levofloxacin (LEVAQUIN) IV  750 mg Intravenous Q24H  . sodium chloride  3 mL Intravenous Q12H  . vancomycin  500 mg Intravenous Q12H   Assessment: 67 yo F currently on day#5 Levaquin for E.coli UTi & bacteremia.  She is also on day#3 Vancomycin which was added for PNA per CXR given recent hospitalization.  She is clincally asymptomatic.  Patient remains afebrile and WBC trending down.   Her renal function is improving, but still elevated above her baseline (baseline Scr ~0.7).   Vancomycin trough level just below desired goal range.  Goal of Therapy:  Vancomycin trough level 15-20 mcg/ml  Plan:  1) Continue Levaquin 750mg  IV Q24h (renal function borderline for dose decrease, but continue as ordered since renal fxn improving) 2) Continue Vancomycin 500mg  IV Q12h 3) Check weekly Vancomycin trough while on antibiotics 4) Monitor renal function and cx data  5) Duration of therapy per MD  Elson Clan 01/03/2013,10:47 AM

## 2013-01-03 NOTE — Progress Notes (Signed)
PT Cancellation Note  Patient Details Name: Kayla Sawyer MRN: [id number] DOB: September 06, 1945   Cancelled Treatment:    Reason Eval/Treat Not Completed: Other (comment) Pt states that she is back to normal, feels great and is going home today.  She doesn't feel that she will have any mobility problems at home and declines any further PT.  [name] L 01/03/2013, 1:41 PM

## 2013-01-03 NOTE — Discharge Summary (Signed)
Physician Discharge Summary  Kayla Sawyer:[id number] DOB: [birth year] DOA: 12/30/2012  PCP: Catalina Pizza, MD  Admit date: 12/30/2012 Discharge date: 01/03/2013  Time spent: Greater than 30 minutes  Recommendations for Outpatient Follow-up:  1. She will followup with Dr. Margo Aye as scheduled. 2. Recommend followup CBC and renal panel.  Discharge Diagnoses:   1. Bilateral pyelonephritis secondary to Escherichia coli. 2. Escherichia coli bacteremia without septicemia or sepsis. 3. Recent hospitalization for Escherichia coli sepsis secondary to UTI in May 2014 at an outside hospital. 4. Acute renal failure secondary to prerenal azotemia and pyelonephritis. Her creatinine was 2.45 on admission and 1.21 at the time of discharge. 5. Generalized weakness/generalized failure to thrive secondary to #1 and #2. Resolved. 6. COPD. Stable. 7. Low back pain secondary to pyelonephritis. Resolved. 8. Possible pneumonia, but no clinical pulmonary symptoms. 9. Hypokalemia. Supplement and repleted.  Discharge Condition: Improved.  Diet recommendation: As tolerated.  Filed Weights   12/30/12 1402 12/30/12 1515  Weight: 66.5 kg (146 lb 9.7 oz) 65.772 kg (145 lb)    History of present illness:  The patient is a 67 are old woman with controlled COPD, who presented to the hospital on 12/30/2012 with a chief complaint of generalized weakness. She was recently hospitalized at an outside hospital for Escherichia coli bacteremia and sepsis secondary to Escherichia coli urinary tract infection. She was discharged on Ceftin and Zithromax for which she completed. However, she did not improve and became increasingly weak, with complaints of chills, sweats, poor appetite, nausea, and low back pain. She was directly admitted from Dr. Scharlene Gloss office.  Hospital Course:  On admission, the patient's blood pressure was 96/62. She was afebrile. Her lab data were significant for a urinalysis that revealed 21-50 WBCs, many  bacteria, and nitrites. Her serum potassium was 2.7, her creatinine was 2.45, and a right blood cell count was 24.5. The chest x-ray revealed mild left lower lobe infiltrate, but the patient had no complaints of cough. Urine culture and blood cultures were ordered. She was started on vigorous IV fluids and IV Levaquin. Because she was recently hospitalized, vancomycin was added empirically. Analgesics was ordered as needed for pain. She was repleted and supplemented with potassium chloride orally and in the IV fluids.  After her renal function improved, A CT scan of the abdomen and pelvis with IV contrast was ordered given the patient's low back pain and persistent leukocytosis. There was a concern about vertebral discitis or osteomyelitis or pyelonephritis. The CT revealed findings consistent with bilateral pyelonephritis, but no evidence of abdominal or pelvic abscess and no evidence of vertebral abnormalities. The urine culture ordered eventually grew greater than 100,000 colonies of Escherichia coli. Her blood cultures also grew out Escherichia coli. The Escherichia coli was sensitive to Levaquin.  Her back pain subsided. Her strength improved. She began to ambulate without assistance. Her appetite improved. Her renal function improved and at the time of discharge, her creatinine was 1.21. Her white blood cell count improved to 14.9, but did not normalize prior to discharge. She was clearly symptomatically improved. She received a total of 4 days of IV antibiotics. She was discharged on 10 more days of Levaquin.  Procedures:  None  Consultations:  None  Discharge Exam: Filed Vitals:   01/02/13 0605 01/02/13 1415 01/02/13 2300 01/03/13 0632  BP: 121/46 139/53 128/56 136/54  Pulse: 75 77 83 76  Temp: 98 F (36.7 C) 97.2 F (36.2 C) 98 F (36.7 C) 98.7 F (37.1 C)  TempSrc:  Oral Oral Oral Oral  Resp: 18 18 20 20   Height:      Weight:      SpO2: 96% 99% 96% 94%    General: 67 year old  Caucasian woman sitting up in a chair, in no acute distress. Overall, she looks much better. Cardiovascular: S1, S2, with no murmurs rubs or gallops. Respiratory: Clear to auscultation bilaterally. Abdomen: Positive bowel sounds, soft, nontender, nondistended. No CVA tenderness. Back: No lumbosacral tenderness.  Discharge Instructions  Discharge Orders   Future Orders Complete By Expires     Diet general  As directed     Increase activity slowly  As directed         Medication List    STOP taking these medications       cefUROXime 500 MG tablet  Commonly known as:  CEFTIN     HYDROcodone-acetaminophen 5-325 MG per tablet  Commonly known as:  NORCO/VICODIN     ibuprofen 200 MG tablet  Commonly known as:  ADVIL,MOTRIN      TAKE these medications       levofloxacin 750 MG tablet  Commonly known as:  LEVAQUIN  Take 1 tablet (750 mg total) by mouth daily.       No Known Allergies     Follow-up Information   Follow up with Catalina Pizza, MD On 01/14/2013. (Your appointment is at 11am)    Contact information:    [address]  [place] [address] [phone]        The results of significant diagnostics from this hospitalization (including imaging, microbiology, ancillary and laboratory) are listed below for reference.    Significant Diagnostic Studies: Dg Chest 2 View  12/30/2012   *RADIOLOGY REPORT*  Clinical Data: Recent pneumonia for follow-up  CHEST - 2 VIEW  Comparison: 07/10/2011  Findings: The heart size and vascular pattern are normal. Hyperinflation indicates COPD. There is mild interstitial change at both lung bases. There is mild posterior left lower lobe opacity suggesting possibility of pneumonia.  IMPRESSION:  COPD.  Consistent with history, mild left lower lobe infiltrate.   Original Report Authenticated By: Esperanza Heir, M.D.   Dg Lumbar Spine Complete  12/13/2012   *RADIOLOGY REPORT*  Clinical Data: Lower back pain, radiating to the left hip.   LUMBAR SPINE - COMPLETE 4+ VIEW  Comparison: None.  Findings: There is no evidence of acute fracture or subluxation. Vertebral bodies demonstrate normal height and alignment.  Mild multilevel disc space narrowing is noted, with scattered associated osteophytes.  The visualized bowel gas pattern is unremarkable in appearance; air and stool are noted within the colon.  The sacroiliac joints are within normal limits.  IMPRESSION:  1.  No evidence of acute fracture or subluxation along the lumbar spine. 2.  Mild degenerative change noted along the lumbar spine.   Original Report Authenticated By: Tonia Ghent, M.D.   Ct Abdomen Pelvis W Contrast  01/02/2013   CLINICAL DATA:  Sepsis. Failure to thrive. Fever and chills. Weakness.  EXAM: CT ABDOMEN AND PELVIS WITH CONTRAST  TECHNIQUE: Multidetector CT imaging of the abdomen and pelvis was performed following the standard protocol during bolus administration of intravenous contrast.  COMPARISON:  None.  FINDINGS: The liver, gallbladder, pancreas, spleen, and adrenal glands are normal in appearance.  The a few tiny renal cysts are noted bilaterally. There is diffusely heterogeneous enhancement of both kidneys which is symmetric. Both kidneys are normal in size and there is no evidence of perinephric fluid or inflammatory changes. No  evidence of renal calculi or hydronephrosis. No evidence of renal mass or abscess.  Prior hysterectomy noted. Adnexal regions are unremarkable. No soft tissue masses or lymphadenopathy identified. No other focal inflammatory process or abnormal fluid collections are identified. There is no evidence of bowel wall thickening, dilatation, or hernia. Lumbar degenerative disc disease is noted, however there is no CT evidence of infectious discitis.  IMPRESSION: 1. Diffusely heterogeneous enhancement of both kidneys, without evidence of focal mass lesion or hydronephrosis. This finding is nonspecific and may be seen with bilateral pyelonephritis  or other medical renal diseases. Recommend correlation with urinalysis and culture is recommended. 2. No evidence of abdominal or pelvic abscess or other significant abnormality.   Electronically Signed   By: Myles Rosenthal   On: 01/02/2013 13:02    Microbiology: Recent Results (from the past 240 hour(s))  CULTURE, BLOOD (ROUTINE X 2)     Status: None   Collection Time    12/30/12  4:04 PM      Result Value Range Status   Specimen Description BLOOD RIGHT ANTECUBITAL   Final   Special Requests     Final   Value: BOTTLES DRAWN AEROBIC AND ANAEROBIC AEB=10CC ANA=12CC   Culture  Setup Time 12/31/2012 13:41   Final   Culture     Final   Value: ESCHERICHIA COLI     Note: Gram Stain Report Called to,Read Back By and Verified With: Linwood Dibbles RN AT 0600 12/31/12 BY RESSEGGER R Performed at [facility]   Report Status 01/02/2013 FINAL   Final   Organism ID, Bacteria ESCHERICHIA COLI   Final  CULTURE, BLOOD (ROUTINE X 2)     Status: None   Collection Time    12/30/12  4:05 PM      Result Value Range Status   Specimen Description BLOOD LEFT ANTECUBITAL   Final   Special Requests BOTTLES DRAWN AEROBIC AND ANAEROBIC 10CC   Final   Culture  Setup Time 12/31/2012 13:41   Final   Culture     Final   Value: ESCHERICHIA COLI     Note: SUSCEPTIBILITIES PERFORMED ON PREVIOUS CULTURE WITHIN THE LAST 5 DAYS.     Note: Gram Stain Report Called to,Read Back By and Verified With: Linwood Dibbles RN AT 0600 12/31/12 BY RESSEGGER R Performed at [facility]   Report Status 01/02/2013 FINAL   Final  URINE CULTURE     Status: None   Collection Time    12/30/12  6:30 PM      Result Value Range Status   Specimen Description URINE, CLEAN CATCH   Final   Special Requests NONE   Final   Culture  Setup Time 12/30/2012 20:30   Final   Colony Count >=100,000 COLONIES/ML   Final   Culture ESCHERICHIA COLI   Final   Report Status 01/01/2013 FINAL   Final   Organism ID, Bacteria ESCHERICHIA COLI   Final      Labs: Basic Metabolic Panel:  Recent Labs Lab 12/30/12 1600 12/31/12 0511 01/01/13 0441 01/02/13 0517 01/03/13 0509  NA 134* 136 138 138 137  K 2.7* 3.9 4.2 4.4 4.0  CL 95* 102 107 106 102  CO2 24 23 19 20 23   GLUCOSE 106* 111* 113* 100* 98  BUN 46* 39* 27* 19 15  CREATININE 2.45* 2.04* 1.42* 1.25* 1.21*  CALCIUM 8.6 8.3* 7.9* 8.2* 8.5  MG  --  1.5  --   --   --  Liver Function Tests:  Recent Labs Lab 12/30/12 1600  AST 20  ALT 26  ALKPHOS 255*  BILITOT 0.3  PROT 6.5  ALBUMIN 2.3*   No results found for this basename: LIPASE, AMYLASE,  in the last 168 hours No results found for this basename: AMMONIA,  in the last 168 hours CBC:  Recent Labs Lab 12/30/12 1600 12/31/12 0511 01/01/13 0441 01/02/13 0517 01/03/13 0509  WBC 24.5* 22.2* 19.2* 17.7* 14.9*  NEUTROABS 20.0*  --   --   --   --   HGB 12.2 11.7* 11.2* 11.1* 11.3*  HCT 35.0* 34.2* 32.4* 32.4* 32.9*  MCV 85.8 85.9 86.6 86.4 86.6  PLT 285 264 241 255 288   Cardiac Enzymes: No results found for this basename: CKTOTAL, CKMB, CKMBINDEX, TROPONINI,  in the last 168 hours BNP: BNP (last 3 results) No results found for this basename: PROBNP,  in the last 8760 hours CBG: No results found for this basename: GLUCAP,  in the last 168 hours     Signed:  Porschia Willbanks  [facility] 01/03/2013, 5:32 PM

## 2013-01-03 NOTE — Progress Notes (Signed)
Patient discharged home with husband after receiving IV levaquin dose this afternoon.  IV removed - WNL.  Follow up with PCP in place.  Instructed on new antibiotic to take at home and importance of completing dose.  Instructed on proper hand hygiene and peri care to prevent further UTIs.  Encouraged to drink plenty of fluids.  Patient verbalizes understanding of discharge instructions.  No questions at this time.  Patient left floor in stable condition via WC with NT.

## 2013-02-19 ENCOUNTER — Other Ambulatory Visit: Payer: Self-pay

## 2013-05-22 ENCOUNTER — Other Ambulatory Visit: Payer: Self-pay

## 2013-05-28 ENCOUNTER — Other Ambulatory Visit (HOSPITAL_COMMUNITY): Payer: Self-pay | Admitting: Internal Medicine

## 2013-05-28 DIAGNOSIS — I739 Peripheral vascular disease, unspecified: Secondary | ICD-10-CM

## 2013-05-29 ENCOUNTER — Ambulatory Visit (HOSPITAL_COMMUNITY): Payer: Medicare Other

## 2013-05-29 ENCOUNTER — Ambulatory Visit (HOSPITAL_COMMUNITY): Admission: RE | Admit: 2013-05-29 | Payer: Medicare Other | Source: Ambulatory Visit

## 2013-05-30 ENCOUNTER — Ambulatory Visit (HOSPITAL_COMMUNITY)
Admission: RE | Admit: 2013-05-30 | Discharge: 2013-05-30 | Disposition: A | Discharge status: Home / Self Care | Payer: Medicare Other | Source: Ambulatory Visit | Attending: Internal Medicine | Admitting: Internal Medicine

## 2013-05-30 DIAGNOSIS — I739 Peripheral vascular disease, unspecified: Secondary | ICD-10-CM

## 2013-05-30 DIAGNOSIS — I70219 Atherosclerosis of native arteries of extremities with intermittent claudication, unspecified extremity: Secondary | ICD-10-CM | POA: Insufficient documentation

## 2014-09-15 DIAGNOSIS — J449 Chronic obstructive pulmonary disease, unspecified: Secondary | ICD-10-CM | POA: Diagnosis not present

## 2014-09-15 DIAGNOSIS — Z125 Encounter for screening for malignant neoplasm of prostate: Secondary | ICD-10-CM | POA: Diagnosis not present

## 2014-09-15 DIAGNOSIS — M7061 Trochanteric bursitis, right hip: Secondary | ICD-10-CM | POA: Diagnosis not present

## 2014-09-15 DIAGNOSIS — M25551 Pain in right hip: Secondary | ICD-10-CM | POA: Diagnosis not present

## 2014-09-18 ENCOUNTER — Other Ambulatory Visit (HOSPITAL_COMMUNITY): Payer: Self-pay | Admitting: Internal Medicine

## 2014-09-18 DIAGNOSIS — R1310 Dysphagia, unspecified: Secondary | ICD-10-CM

## 2014-09-22 ENCOUNTER — Ambulatory Visit (HOSPITAL_COMMUNITY)
Admission: RE | Admit: 2014-09-22 | Discharge: 2014-09-22 | Disposition: A | Discharge status: Home / Self Care | Payer: Medicare Other | Source: Ambulatory Visit | Attending: Internal Medicine | Admitting: Internal Medicine

## 2014-09-22 DIAGNOSIS — R1310 Dysphagia, unspecified: Secondary | ICD-10-CM | POA: Insufficient documentation

## 2014-09-22 DIAGNOSIS — M542 Cervicalgia: Secondary | ICD-10-CM | POA: Diagnosis not present

## 2014-09-22 DIAGNOSIS — H9202 Otalgia, left ear: Secondary | ICD-10-CM | POA: Diagnosis not present

## 2014-10-19 DIAGNOSIS — E782 Mixed hyperlipidemia: Secondary | ICD-10-CM | POA: Diagnosis not present

## 2014-10-19 DIAGNOSIS — R7301 Impaired fasting glucose: Secondary | ICD-10-CM | POA: Diagnosis not present

## 2014-10-21 DIAGNOSIS — R7301 Impaired fasting glucose: Secondary | ICD-10-CM | POA: Diagnosis not present

## 2014-10-21 DIAGNOSIS — K219 Gastro-esophageal reflux disease without esophagitis: Secondary | ICD-10-CM | POA: Diagnosis not present

## 2014-10-21 DIAGNOSIS — E782 Mixed hyperlipidemia: Secondary | ICD-10-CM | POA: Diagnosis not present

## 2014-10-21 DIAGNOSIS — F419 Anxiety disorder, unspecified: Secondary | ICD-10-CM | POA: Diagnosis not present

## 2014-11-25 DIAGNOSIS — F329 Major depressive disorder, single episode, unspecified: Secondary | ICD-10-CM | POA: Diagnosis not present

## 2015-04-19 DIAGNOSIS — R7301 Impaired fasting glucose: Secondary | ICD-10-CM | POA: Diagnosis not present

## 2015-04-19 DIAGNOSIS — E782 Mixed hyperlipidemia: Secondary | ICD-10-CM | POA: Diagnosis not present

## 2015-04-21 DIAGNOSIS — F329 Major depressive disorder, single episode, unspecified: Secondary | ICD-10-CM | POA: Diagnosis not present

## 2015-04-21 DIAGNOSIS — Z23 Encounter for immunization: Secondary | ICD-10-CM | POA: Diagnosis not present

## 2015-04-21 DIAGNOSIS — K219 Gastro-esophageal reflux disease without esophagitis: Secondary | ICD-10-CM | POA: Diagnosis not present

## 2015-04-21 DIAGNOSIS — E782 Mixed hyperlipidemia: Secondary | ICD-10-CM | POA: Diagnosis not present

## 2015-04-21 DIAGNOSIS — F419 Anxiety disorder, unspecified: Secondary | ICD-10-CM | POA: Diagnosis not present

## 2015-05-06 ENCOUNTER — Other Ambulatory Visit (HOSPITAL_COMMUNITY): Payer: Self-pay | Admitting: Internal Medicine

## 2015-05-06 DIAGNOSIS — Z1231 Encounter for screening mammogram for malignant neoplasm of breast: Secondary | ICD-10-CM

## 2015-05-07 ENCOUNTER — Encounter (INDEPENDENT_AMBULATORY_CARE_PROVIDER_SITE_OTHER): Payer: Self-pay | Admitting: *Deleted

## 2015-05-12 ENCOUNTER — Ambulatory Visit (HOSPITAL_COMMUNITY)
Admission: RE | Admit: 2015-05-12 | Discharge: 2015-05-12 | Disposition: A | Discharge status: Home / Self Care | Payer: Medicare Other | Source: Ambulatory Visit | Attending: Internal Medicine | Admitting: Internal Medicine

## 2015-05-12 DIAGNOSIS — Z1231 Encounter for screening mammogram for malignant neoplasm of breast: Secondary | ICD-10-CM | POA: Diagnosis not present

## 2015-05-13 ENCOUNTER — Encounter (INDEPENDENT_AMBULATORY_CARE_PROVIDER_SITE_OTHER): Payer: Self-pay | Admitting: *Deleted

## 2015-05-13 ENCOUNTER — Other Ambulatory Visit (INDEPENDENT_AMBULATORY_CARE_PROVIDER_SITE_OTHER): Payer: Self-pay | Admitting: *Deleted

## 2015-05-13 DIAGNOSIS — Z1211 Encounter for screening for malignant neoplasm of colon: Secondary | ICD-10-CM

## 2015-05-13 NOTE — Telephone Encounter (Signed)
This encounter was created in error - please disregard.

## 2015-06-23 ENCOUNTER — Telehealth (INDEPENDENT_AMBULATORY_CARE_PROVIDER_SITE_OTHER): Payer: Self-pay | Admitting: *Deleted

## 2015-06-23 DIAGNOSIS — Z1211 Encounter for screening for malignant neoplasm of colon: Secondary | ICD-10-CM

## 2015-06-23 MED ORDER — PEG 3350-KCL-NA BICARB-NACL 420 G PO SOLR: 4000.0000 mL | Freq: Once | ORAL | Status: DC

## 2015-06-23 NOTE — Telephone Encounter (Signed)
Patient needs trilyte 

## 2015-07-20 ENCOUNTER — Telehealth (INDEPENDENT_AMBULATORY_CARE_PROVIDER_SITE_OTHER): Payer: Self-pay | Admitting: *Deleted

## 2015-07-20 NOTE — Telephone Encounter (Signed)
agree

## 2015-07-20 NOTE — Telephone Encounter (Signed)
Referring MD/PCP: hall   Procedure: tcs  Reason/Indication:  screening  Has patient had this procedure before?  Yes, 10 yrs ago  If so, when, by whom and where?    Is there a family history of colon cancer?  no  Who?  What age when diagnosed?    Is patient diabetic?   no      Does patient have prosthetic heart valve or mechanical valve?  no  Do you have a pacemaker?  no  Has patient ever had endocarditis? no  Has patient had joint replacement within last 12 months?  no  Does patient tend to be constipated or take laxatives? no  Does patient have a history of alcohol/drug use?  no  Is patient on Coumadin, Plavix and/or Aspirin? no  Medications: sertraline 25 mg daily, omeprazole 20 mg daily  Allergies: nkda  Medication Adjustment:   Procedure date & time: 08/05/15 at 730

## 2015-08-05 ENCOUNTER — Ambulatory Visit (HOSPITAL_COMMUNITY)
Admission: RE | Admit: 2015-08-05 | Discharge: 2015-08-05 | Disposition: A | Discharge status: Home / Self Care | Payer: Medicare Other | Source: Ambulatory Visit | Attending: Internal Medicine | Admitting: Internal Medicine

## 2015-08-05 ENCOUNTER — Encounter (HOSPITAL_COMMUNITY): Payer: Self-pay | Admitting: *Deleted

## 2015-08-05 ENCOUNTER — Encounter (HOSPITAL_COMMUNITY)
Admission: RE | Disposition: A | Discharge status: Home / Self Care | Payer: Self-pay | Source: Ambulatory Visit | Attending: Internal Medicine

## 2015-08-05 DIAGNOSIS — F17210 Nicotine dependence, cigarettes, uncomplicated: Secondary | ICD-10-CM | POA: Insufficient documentation

## 2015-08-05 DIAGNOSIS — J449 Chronic obstructive pulmonary disease, unspecified: Secondary | ICD-10-CM | POA: Insufficient documentation

## 2015-08-05 DIAGNOSIS — Z8701 Personal history of pneumonia (recurrent): Secondary | ICD-10-CM | POA: Diagnosis not present

## 2015-08-05 DIAGNOSIS — K5730 Diverticulosis of large intestine without perforation or abscess without bleeding: Secondary | ICD-10-CM | POA: Insufficient documentation

## 2015-08-05 DIAGNOSIS — Z1211 Encounter for screening for malignant neoplasm of colon: Secondary | ICD-10-CM | POA: Diagnosis not present

## 2015-08-05 HISTORY — PX: COLONOSCOPY: SHX5424

## 2015-08-05 SURGERY — COLONOSCOPY
Anesthesia: Moderate Sedation

## 2015-08-05 MED ORDER — STERILE WATER FOR IRRIGATION IR SOLN
Status: DC | PRN
  Administered 2015-08-05: 08:00:00

## 2015-08-05 MED ORDER — MIDAZOLAM HCL 5 MG/5ML IJ SOLN
INTRAMUSCULAR | Status: DC | PRN
  Administered 2015-08-05 (×2): 1 mg via INTRAVENOUS
  Administered 2015-08-05: 2 mg via INTRAVENOUS
  Administered 2015-08-05: 1 mg via INTRAVENOUS

## 2015-08-05 MED ORDER — MEPERIDINE HCL 50 MG/ML IJ SOLN
INTRAMUSCULAR | Status: AC
  Filled 2015-08-05: qty 1

## 2015-08-05 MED ORDER — SODIUM CHLORIDE 0.9 % IV SOLN
INTRAVENOUS | Status: DC
  Administered 2015-08-05: 07:00:00 via INTRAVENOUS

## 2015-08-05 MED ORDER — MEPERIDINE HCL 50 MG/ML IJ SOLN
INTRAMUSCULAR | Status: DC | PRN
  Administered 2015-08-05: 25 mg via INTRAVENOUS

## 2015-08-05 MED ORDER — MIDAZOLAM HCL 5 MG/5ML IJ SOLN
INTRAMUSCULAR | Status: AC
  Filled 2015-08-05: qty 10

## 2015-08-05 NOTE — H&P (Signed)
Kayla Sawyer is an 70 y.o. female.   Chief Complaint: Patient is here for colonoscopy. HPI: Patient is 70 year old Caucasian female was here for screening colonoscopy. She denies abdominal pain change in bowel habits or rectal bleeding. Last exam was 10 years ago. Family history is negative for CRC.  Past Medical History  Diagnosis Date  . Sepsis ([place])   . Pneumonia   . COPD (chronic obstructive pulmonary disease) ([place])   . E. coli pyelonephritis 6//2014    Past Surgical History  Procedure Laterality Date  . Elbow surgery    . Abdominal hysterectomy      partial  . Back surgery      L4/5    Family History  Problem Relation Age of Onset  . Diabetes Mother    Social History:  reports that she has been smoking Cigarettes.  She has a 22.5 pack-year smoking history. She does not have any smokeless tobacco history on file. She reports that she does not drink alcohol or use illicit drugs.  Allergies: No Known Allergies  Medications Prior to Admission  Medication Sig Dispense Refill  . Multiple Vitamin (MULTIVITAMIN WITH MINERALS) TABS tablet Take 1 tablet by mouth daily.    Marland Kitchen omeprazole (PRILOSEC) 20 MG capsule Take 20 mg by mouth daily.     . polyethylene glycol-electrolytes (NULYTELY/GOLYTELY) 420 G solution Take 4,000 mLs by mouth once. 4000 mL 0  . sertraline (ZOLOFT) 25 MG tablet Take 25 mg by mouth daily as needed (anxiety attack).       No results found for this or any previous visit (from the past 48 hour(s)). No results found.  ROS  Blood pressure 118/73, pulse 113, temperature 97.6 F (36.4 C), temperature source Oral, resp. rate 18, height '5\' 8"'$  (1.727 m), weight 147 lb (66.679 kg), SpO2 96 %. Physical Exam  Constitutional:  Well-developed thin Caucasian female in NAD.  HENT:  Mouth/Throat: Oropharynx is clear and moist.  Eyes: Conjunctivae are normal. No scleral icterus.  Neck: No thyromegaly present.  Cardiovascular: Normal rate, regular rhythm and normal  heart sounds.   No murmur heard. Respiratory: Effort normal and breath sounds normal.  GI: She exhibits no distension. There is no tenderness.  Musculoskeletal: She exhibits no edema.  Lymphadenopathy:    She has no cervical adenopathy.  Neurological: She is alert.  Skin: Skin is warm and dry.     Assessment/Plan Average risk screening colonoscopy.  Coltyn Hanning U 08/05/2015, 7:29 AM

## 2015-08-05 NOTE — Discharge Instructions (Signed)
Resume usual medications and high fiber diet. No driving for 24 hours. Next screening exam in 10 years.  Colonoscopy, Care After These instructions give you information on caring for yourself after your procedure. Your doctor may also give you more specific instructions. Call your doctor if you have any problems or questions after your procedure. HOME CARE  Do not drive for 24 hours.  Do not sign important papers or use machinery for 24 hours.  You may shower.  You may go back to your usual activities, but go slower for the first 24 hours.  Take rest breaks often during the first 24 hours.  Walk around or use warm packs on your belly (abdomen) if you have belly cramping or gas.  Drink enough fluids to keep your pee (urine) clear or pale yellow.  Resume your normal diet. Avoid heavy or fried foods.  Avoid drinking alcohol for 24 hours or as told by your doctor.  Only take medicines as told by your doctor. If a tissue sample (biopsy) was taken during the procedure:   Do not take aspirin or blood thinners for 7 days, or as told by your doctor.  Do not drink alcohol for 7 days, or as told by your doctor.  Eat soft foods for the first 24 hours. GET HELP IF: You still have a small amount of blood in your poop (stool) 2-3 days after the procedure. GET HELP RIGHT AWAY IF:  You have more than a small amount of blood in your poop.  You see clumps of tissue (blood clots) in your poop.  Your belly is puffy (swollen).  You feel sick to your stomach (nauseous) or throw up (vomit).  You have a fever.  You have belly pain that gets worse and medicine does not help. MAKE SURE YOU:  Understand these instructions.  Will watch your condition.  Will get help right away if you are not doing well or get worse.   This information is not intended to replace advice given to you by your health care provider. Make sure you discuss any questions you have with your health care provider.   Document Released: 08/05/2010 Document Revised: 07/08/2013 Document Reviewed: 03/10/2013 Elsevier Interactive Patient Education [employer].   Diverticulosis Diverticulosis is the condition that develops when small pouches (diverticula) form in the wall of your colon. Your colon, or large intestine, is where water is absorbed and stool is formed. The pouches form when the inside layer of your colon pushes through weak spots in the outer layers of your colon. CAUSES  No one knows exactly what causes diverticulosis. RISK FACTORS  Being older than 38. Your risk for this condition increases with age. Diverticulosis is rare in people younger than 40 years. By age 65, almost everyone has it.  Eating a low-fiber diet.  Being frequently constipated.  Being overweight.  Not getting enough exercise.  Smoking.  Taking over-the-counter pain medicines, like aspirin and ibuprofen. SYMPTOMS  Most people with diverticulosis do not have symptoms. DIAGNOSIS  Because diverticulosis often has no symptoms, health care providers often discover the condition during an exam for other colon problems. In many cases, a health care provider will diagnose diverticulosis while using a flexible scope to examine the colon (colonoscopy). TREATMENT  If you have never developed an infection related to diverticulosis, you may not need treatment. If you have had an infection before, treatment may include:  Eating more fruits, vegetables, and grains.  Taking a fiber supplement.  Taking a  live bacteria supplement (probiotic).  Taking medicine to relax your colon. HOME CARE INSTRUCTIONS   Drink at least 6-8 glasses of water each day to prevent constipation.  Try not to strain when you have a bowel movement.  Keep all follow-up appointments. If you have had an infection before:  Increase the fiber in your diet as directed by your health care provider or dietitian.  Take a dietary fiber supplement if  your health care provider approves.  Only take medicines as directed by your health care provider. SEEK MEDICAL CARE IF:   You have abdominal pain.  You have bloating.  You have cramps.  You have not gone to the bathroom in 3 days. SEEK IMMEDIATE MEDICAL CARE IF:   Your pain gets worse.  Yourbloating becomes very bad.  You have a fever or chills, and your symptoms suddenly get worse.  You begin vomiting.  You have bowel movements that are bloody or black. MAKE SURE YOU:  Understand these instructions.  Will watch your condition.  Will get help right away if you are not doing well or get worse.   This information is not intended to replace advice given to you by your health care provider. Make sure you discuss any questions you have with your health care provider.   Document Released: 03/30/2004 Document Revised: 07/08/2013 Document Reviewed: 05/28/2013 Elsevier Interactive Patient Education 2016 Elsevier Inc.   High-Fiber Diet Fiber, also called dietary fiber, is a type of carbohydrate found in fruits, vegetables, whole grains, and beans. A high-fiber diet can have many health benefits. Your health care provider may recommend a high-fiber diet to help:  Prevent constipation. Fiber can make your bowel movements more regular.  Lower your cholesterol.  Relieve hemorrhoids, uncomplicated diverticulosis, or irritable bowel syndrome.  Prevent overeating as part of a weight-loss plan.  Prevent heart disease, type 2 diabetes, and certain cancers. WHAT IS MY PLAN? The recommended daily intake of fiber includes:  38 grams for men under age 66.  70 grams for men over age 71.  68 grams for women under age 33.  78 grams for women over age 28. You can get the recommended daily intake of dietary fiber by eating a variety of fruits, vegetables, grains, and beans. Your health care provider may also recommend a fiber supplement if it is not possible to get enough fiber  through your diet. WHAT DO I NEED TO KNOW ABOUT A HIGH-FIBER DIET?  Fiber supplements have not been widely studied for their effectiveness, so it is better to get fiber through food sources.  Always check the fiber content on thenutrition facts label of any prepackaged food. Look for foods that contain at least 5 grams of fiber per serving.  Ask your dietitian if you have questions about specific foods that are related to your condition, especially if those foods are not listed in the following section.  Increase your daily fiber consumption gradually. Increasing your intake of dietary fiber too quickly may cause bloating, cramping, or gas.  Drink plenty of water. Water helps you to digest fiber. WHAT FOODS CAN I EAT? Grains Whole-grain breads. Multigrain cereal. Oats and oatmeal. Brown rice. Barley. Bulgur wheat. [place]. Bran muffins. Popcorn. Rye wafer crackers. Vegetables Sweet potatoes. Spinach. Kale. Artichokes. Cabbage. Broccoli. Green peas. Carrots. Squash. Fruits Berries. Pears. Apples. Oranges. Avocados. Prunes and raisins. Dried figs. Meats and Other Protein Sources Navy, kidney, pinto, and soy beans. Split peas. Lentils. Nuts and seeds. Dairy Fiber-fortified yogurt. Beverages Fiber-fortified soy milk. Fiber-fortified  orange juice. Other Fiber bars. The items listed above may not be a complete list of recommended foods or beverages. Contact your dietitian for more options. WHAT FOODS ARE NOT RECOMMENDED? Grains White bread. Pasta made with refined flour. White rice. Vegetables Fried potatoes. Canned vegetables. Well-cooked vegetables.  Fruits Fruit juice. Cooked, strained fruit. Meats and Other Protein Sources Fatty cuts of meat. Fried Sales executive or fried fish. Dairy Milk. Yogurt. Cream cheese. Sour cream. Beverages Soft drinks. Other Cakes and pastries. Butter and oils. The items listed above may not be a complete list of foods and beverages to avoid. Contact your  dietitian for more information. WHAT ARE SOME TIPS FOR INCLUDING HIGH-FIBER FOODS IN MY DIET?  Eat a wide variety of high-fiber foods.  Make sure that half of all grains consumed each day are whole grains.  Replace breads and cereals made from refined flour or white flour with whole-grain breads and cereals.  Replace white rice with brown rice, bulgur wheat, or millet.  Start the day with a breakfast that is high in fiber, such as a cereal that contains at least 5 grams of fiber per serving.  Use beans in place of meat in soups, salads, or pasta.  Eat high-fiber snacks, such as berries, raw vegetables, nuts, or popcorn.   This information is not intended to replace advice given to you by your health care provider. Make sure you discuss any questions you have with your health care provider.   Document Released: 07/03/2005 Document Revised: 07/24/2014 Document Reviewed: 12/16/2013 Elsevier Interactive Patient Education [employer].

## 2015-08-05 NOTE — Op Note (Addendum)
COLONOSCOPY PROCEDURE REPORT  PATIENT:  Kayla Sawyer  MR#:  [id number] Birthdate:  09-Feb-1946, 70 y.o., female Endoscopist:  Dr. Rogene Houston, MD Referred By:  Dr. Delphina Cahill, MD Procedure Date: 08/05/2015  Procedure:   Colonoscopy  Indications:  Patient is 70 year old Caucasian female who is undergoing average risk screening colonoscopy.  Informed Consent:  The procedure and risks were reviewed with the patient and informed consent was obtained.  Medications:  Demerol 25 mg IV Versed 5 mg IV  First dose administered at 7:34 AM Last dose administered at 7:50 AM, Scope Out 8:06AM  Description of procedure:  After a digital rectal exam was performed, that colonoscope was advanced from the anus through the rectum and colon to the area of the cecum, ileocecal valve and appendiceal orifice. The cecum was deeply intubated. These structures were well-seen and photographed for the record. From the level of the cecum and ileocecal valve, the scope was slowly and cautiously withdrawn. The mucosal surfaces were carefully surveyed utilizing scope tip to flexion to facilitate fold flattening as needed. The scope was pulled down into the rectum where a thorough exam including retroflexion was performed.  Findings:   Prep satisfactory. Normal mucosa of cecum, ascending colon, hepatic flexure, transverse colon, splenic flexure and descending colon. Few small diverticula noted at sigmoid colon. Normal rectal mucosa and anorectal junction.   Therapeutic/Diagnostic Maneuvers Performed:   None  Complications:  None  EBL: None  Cecal Withdrawal Time:  12 minutes  Impression:  Normal colonoscopy except mild sigmoid colon diverticulosis.  Recommendations:  Standard instructions given. High fiber diet. Next screening exam in 10 years.  [name] U  08/05/2015 8:10 AM  CC: Dr. Wende Neighbors, MD & Dr. Rayne Du ref. provider found

## 2015-08-09 ENCOUNTER — Encounter (HOSPITAL_COMMUNITY): Payer: Self-pay | Admitting: Internal Medicine

## 2015-08-27 DIAGNOSIS — H5203 Hypermetropia, bilateral: Secondary | ICD-10-CM | POA: Diagnosis not present

## 2015-09-17 DIAGNOSIS — R5383 Other fatigue: Secondary | ICD-10-CM | POA: Diagnosis not present

## 2015-09-17 DIAGNOSIS — D510 Vitamin B12 deficiency anemia due to intrinsic factor deficiency: Secondary | ICD-10-CM | POA: Diagnosis not present

## 2015-09-17 DIAGNOSIS — E559 Vitamin D deficiency, unspecified: Secondary | ICD-10-CM | POA: Diagnosis not present

## 2015-09-17 DIAGNOSIS — M25519 Pain in unspecified shoulder: Secondary | ICD-10-CM | POA: Diagnosis not present

## 2015-09-17 DIAGNOSIS — R682 Dry mouth, unspecified: Secondary | ICD-10-CM | POA: Diagnosis not present

## 2015-09-17 DIAGNOSIS — L989 Disorder of the skin and subcutaneous tissue, unspecified: Secondary | ICD-10-CM | POA: Diagnosis not present

## 2015-10-18 DIAGNOSIS — E782 Mixed hyperlipidemia: Secondary | ICD-10-CM | POA: Diagnosis not present

## 2015-10-18 DIAGNOSIS — R7301 Impaired fasting glucose: Secondary | ICD-10-CM | POA: Diagnosis not present

## 2015-10-20 DIAGNOSIS — F339 Major depressive disorder, recurrent, unspecified: Secondary | ICD-10-CM | POA: Diagnosis not present

## 2015-10-20 DIAGNOSIS — R7301 Impaired fasting glucose: Secondary | ICD-10-CM | POA: Diagnosis not present

## 2015-10-20 DIAGNOSIS — K219 Gastro-esophageal reflux disease without esophagitis: Secondary | ICD-10-CM | POA: Diagnosis not present

## 2015-10-20 DIAGNOSIS — Z23 Encounter for immunization: Secondary | ICD-10-CM | POA: Diagnosis not present

## 2015-10-20 DIAGNOSIS — F411 Generalized anxiety disorder: Secondary | ICD-10-CM | POA: Diagnosis not present

## 2015-10-21 DIAGNOSIS — R0602 Shortness of breath: Secondary | ICD-10-CM | POA: Diagnosis not present

## 2015-10-21 DIAGNOSIS — R5383 Other fatigue: Secondary | ICD-10-CM | POA: Diagnosis not present

## 2015-10-21 DIAGNOSIS — I781 Nevus, non-neoplastic: Secondary | ICD-10-CM | POA: Diagnosis not present

## 2015-10-21 DIAGNOSIS — M791 Myalgia: Secondary | ICD-10-CM | POA: Diagnosis not present

## 2015-10-21 DIAGNOSIS — R768 Other specified abnormal immunological findings in serum: Secondary | ICD-10-CM | POA: Diagnosis not present

## 2015-10-21 DIAGNOSIS — M2550 Pain in unspecified joint: Secondary | ICD-10-CM | POA: Diagnosis not present

## 2015-11-18 DIAGNOSIS — R0602 Shortness of breath: Secondary | ICD-10-CM | POA: Diagnosis not present

## 2015-11-18 DIAGNOSIS — M791 Myalgia: Secondary | ICD-10-CM | POA: Diagnosis not present

## 2015-11-18 DIAGNOSIS — I781 Nevus, non-neoplastic: Secondary | ICD-10-CM | POA: Diagnosis not present

## 2015-11-18 DIAGNOSIS — M2550 Pain in unspecified joint: Secondary | ICD-10-CM | POA: Diagnosis not present

## 2015-11-18 DIAGNOSIS — M150 Primary generalized (osteo)arthritis: Secondary | ICD-10-CM | POA: Diagnosis not present

## 2015-11-22 ENCOUNTER — Other Ambulatory Visit (HOSPITAL_COMMUNITY): Payer: Self-pay | Admitting: *Deleted

## 2015-11-22 DIAGNOSIS — M349 Systemic sclerosis, unspecified: Secondary | ICD-10-CM

## 2015-12-09 ENCOUNTER — Ambulatory Visit (HOSPITAL_COMMUNITY)
Admission: RE | Admit: 2015-12-09 | Discharge: 2015-12-09 | Disposition: A | Discharge status: Home / Self Care | Payer: Medicare Other | Source: Ambulatory Visit | Attending: Internal Medicine | Admitting: Internal Medicine

## 2015-12-09 DIAGNOSIS — M349 Systemic sclerosis, unspecified: Secondary | ICD-10-CM | POA: Diagnosis not present

## 2015-12-09 LAB — PULMONARY FUNCTION TEST
DL/VA % pred: 37 %
DL/VA: 1.83 ml/min/mmHg/L
DLCO unc % pred: 29 %
DLCO unc: 7.59 ml/min/mmHg
FEF 25-75 Post: 0.37 L/sec
FEF 25-75 Pre: 0.5 L/sec
FEF2575-%Change-Post: -26 %
FEF2575-%Pred-Post: 18 %
FEF2575-%Pred-Pre: 24 %
FEV1-%Change-Post: -17 %
FEV1-%Pred-Post: 44 %
FEV1-%Pred-Pre: 53 %
FEV1-Post: 1.06 L
FEV1-Pre: 1.28 L
FEV1FVC-%Change-Post: -10 %
FEV1FVC-%Pred-Pre: 68 %
FEV6-%Change-Post: -7 %
FEV6-%Pred-Post: 69 %
FEV6-%Pred-Pre: 76 %
FEV6-Post: 2.11 L
FEV6-Pre: 2.29 L
FEV6FVC-%Change-Post: -1 %
FEV6FVC-%Pred-Post: 96 %
FEV6FVC-%Pred-Pre: 97 %
FVC-%Change-Post: -7 %
FVC-%Pred-Post: 71 %
FVC-%Pred-Pre: 77 %
FVC-Post: 2.27 L
FVC-Pre: 2.44 L
Post FEV1/FVC ratio: 47 %
Post FEV6/FVC ratio: 93 %
Pre FEV1/FVC ratio: 52 %
Pre FEV6/FVC Ratio: 94 %
RV % pred: 130 %
RV: 2.9 L
TLC % pred: 99 %
TLC: 5.17 L

## 2015-12-09 MED ORDER — ALBUTEROL SULFATE (2.5 MG/3ML) 0.083% IN NEBU
2.5000 mg | INHALATION_SOLUTION | Freq: Once | RESPIRATORY_TRACT | Status: AC
  Administered 2015-12-09: 2.5 mg via RESPIRATORY_TRACT

## 2015-12-27 ENCOUNTER — Encounter (HOSPITAL_COMMUNITY): Payer: Self-pay | Admitting: *Deleted

## 2015-12-27 ENCOUNTER — Other Ambulatory Visit (HOSPITAL_COMMUNITY): Payer: Self-pay

## 2015-12-27 ENCOUNTER — Ambulatory Visit (HOSPITAL_BASED_OUTPATIENT_CLINIC_OR_DEPARTMENT_OTHER)
Admission: RE | Admit: 2015-12-27 | Discharge: 2015-12-27 | Disposition: A | Discharge status: Home / Self Care | Payer: Medicare Other | Source: Ambulatory Visit | Attending: Internal Medicine | Admitting: Internal Medicine

## 2015-12-27 ENCOUNTER — Other Ambulatory Visit: Payer: Self-pay

## 2015-12-27 ENCOUNTER — Ambulatory Visit (HOSPITAL_COMMUNITY)
Admission: RE | Admit: 2015-12-27 | Discharge: 2015-12-27 | Disposition: A | Discharge status: Home / Self Care | Payer: Medicare Other | Source: Ambulatory Visit | Attending: Internal Medicine | Admitting: Internal Medicine

## 2015-12-27 VITALS — BP 110/70 | HR 95 | Resp 18 | Wt 139.0 lb

## 2015-12-27 DIAGNOSIS — Z720 Tobacco use: Secondary | ICD-10-CM | POA: Diagnosis not present

## 2015-12-27 DIAGNOSIS — R0989 Other specified symptoms and signs involving the circulatory and respiratory systems: Secondary | ICD-10-CM

## 2015-12-27 DIAGNOSIS — I739 Peripheral vascular disease, unspecified: Secondary | ICD-10-CM | POA: Diagnosis not present

## 2015-12-27 DIAGNOSIS — M349 Systemic sclerosis, unspecified: Secondary | ICD-10-CM

## 2015-12-27 DIAGNOSIS — R942 Abnormal results of pulmonary function studies: Secondary | ICD-10-CM

## 2015-12-27 DIAGNOSIS — R000 Tachycardia, unspecified: Secondary | ICD-10-CM

## 2015-12-27 DIAGNOSIS — R0600 Dyspnea, unspecified: Secondary | ICD-10-CM | POA: Insufficient documentation

## 2015-12-27 DIAGNOSIS — I059 Rheumatic mitral valve disease, unspecified: Secondary | ICD-10-CM | POA: Diagnosis not present

## 2015-12-27 DIAGNOSIS — K219 Gastro-esophageal reflux disease without esophagitis: Secondary | ICD-10-CM | POA: Diagnosis not present

## 2015-12-27 DIAGNOSIS — R0609 Other forms of dyspnea: Secondary | ICD-10-CM

## 2015-12-27 DIAGNOSIS — J449 Chronic obstructive pulmonary disease, unspecified: Secondary | ICD-10-CM | POA: Diagnosis not present

## 2015-12-27 DIAGNOSIS — I517 Cardiomegaly: Secondary | ICD-10-CM | POA: Insufficient documentation

## 2015-12-27 DIAGNOSIS — F17200 Nicotine dependence, unspecified, uncomplicated: Secondary | ICD-10-CM

## 2015-12-27 DIAGNOSIS — R0602 Shortness of breath: Secondary | ICD-10-CM | POA: Diagnosis not present

## 2015-12-27 DIAGNOSIS — I2510 Atherosclerotic heart disease of native coronary artery without angina pectoris: Secondary | ICD-10-CM

## 2015-12-27 DIAGNOSIS — R079 Chest pain, unspecified: Secondary | ICD-10-CM

## 2015-12-27 DIAGNOSIS — R9431 Abnormal electrocardiogram [ECG] [EKG]: Secondary | ICD-10-CM | POA: Diagnosis not present

## 2015-12-27 LAB — ECHOCARDIOGRAM COMPLETE
E decel time: 145 msec
E/e' ratio: 7.88
FS: 31 % (ref 28–44)
IVS/LV PW RATIO, ED: 1.06
LA ID, A-P, ES: 27 mm
LA diam end sys: 27 mm
LA diam index: 1.51 cm/m2
LA vol A4C: 39.4 ml
LA vol index: 23.2 mL/m2
LA vol: 41.5 mL
LV E/e' medial: 7.88
LV E/e'average: 7.88
LV PW d: 10.8 mm — AB (ref 0.6–1.1)
LV e' LATERAL: 8.38 cm/s
LVOT area: 2.84 cm2
LVOT diameter: 19 mm
MV Dec: 145
MV pk A vel: 71.3 m/s
MV pk E vel: 66 m/s
RV sys press: 27 mmHg
Reg peak vel: 243 cm/s
TAPSE: 14.3 mm
TDI e' lateral: 8.38
TDI e' medial: 5.87
TR max vel: 243 cm/s

## 2015-12-27 LAB — CBC
HCT: 45.9 % (ref 36.0–46.0)
Hemoglobin: 15.5 g/dL — ABNORMAL HIGH (ref 12.0–15.0)
MCH: 30.2 pg (ref 26.0–34.0)
MCHC: 33.8 g/dL (ref 30.0–36.0)
MCV: 89.5 fL (ref 78.0–100.0)
Platelets: 215 10*3/uL (ref 150–400)
RBC: 5.13 MIL/uL — ABNORMAL HIGH (ref 3.87–5.11)
RDW: 14.3 % (ref 11.5–15.5)
WBC: 8.1 10*3/uL (ref 4.0–10.5)

## 2015-12-27 LAB — COMPREHENSIVE METABOLIC PANEL
ALT: 10 U/L — ABNORMAL LOW (ref 14–54)
AST: 14 U/L — ABNORMAL LOW (ref 15–41)
Albumin: 3.9 g/dL (ref 3.5–5.0)
Alkaline Phosphatase: 44 U/L (ref 38–126)
Anion gap: 8 (ref 5–15)
BUN: 23 mg/dL — ABNORMAL HIGH (ref 6–20)
CO2: 24 mmol/L (ref 22–32)
Calcium: 9.2 mg/dL (ref 8.9–10.3)
Chloride: 105 mmol/L (ref 101–111)
Creatinine, Ser: 0.82 mg/dL (ref 0.44–1.00)
GFR calc Af Amer: >60 mL/min (ref >60)
GFR calc non Af Amer: >60 mL/min (ref >60)
Glucose, Bld: 93 mg/dL (ref 65–99)
Potassium: 4 mmol/L (ref 3.5–5.1)
Sodium: 137 mmol/L (ref 135–145)
Total Bilirubin: 0.5 mg/dL (ref 0.3–1.2)
Total Protein: 6.4 g/dL — ABNORMAL LOW (ref 6.5–8.1)

## 2015-12-27 LAB — T4, FREE: Free T4: 0.82 ng/dL (ref 0.61–1.12)

## 2015-12-27 LAB — SEDIMENTATION RATE: Sed Rate: 10 mm/hr (ref 0–22)

## 2015-12-27 LAB — TSH: TSH: 0.427 u[IU]/mL (ref 0.350–4.500)

## 2015-12-27 NOTE — Patient Instructions (Signed)
Routine lab work today. Will notify you of abnormal results  Your provider has requested you have the following tests performed: -High Resolution Chest CT -ABI's -Carotid Ultrasound -Right and left Heart Cath  Follow up with Dr.Bensimhon in 6 weeks

## 2015-12-27 NOTE — Progress Notes (Signed)
*  PRELIMINARY RESULTS* Echocardiogram 2D Echocardiogram has been performed.  Kayla Sawyer 12/27/2015, 11:17 AM

## 2015-12-27 NOTE — Progress Notes (Signed)
Patient ID: Kayla Sawyer, female   DOB: 01/26/1946, 70 y.o.   MRN: [id number]    Advanced Heart Failure Clinic Note   Referring Physician: Dr Gavin Pound Primary Cardiologist: New  HPI:  Kayla Sawyer is a 70 y.o. female with history of probable scleroderma sent for evaluation of Pulmonary HTN. Referred by Dr Trudie Reed.    She is a life-long smoker. Denies h/o known CAD. However was admitted several years ago for pyelonephritis and CT scans show diffuse coronary calcifications and mesenteric atherosclerosis. Also had carotid u/s at that time with bilateral 40-59% stenoses.   States she has "no energy". Fatigued with any exertion. + DOE. Wears pulse ox and sats 92-93%. HR fast. Sometimes has chest pains in the night, last time this happened was a month ago. Has pain throughout legs with walking moderate distances that improve with rest. Smokes 1/2 ppd + and has for 50 years. Had PFTs with relatively normal spirometry and DLCO 29% concerning for PAH in setting of scleroderma.  CT chest 07/07/11 shows heavy calcification of SMA, as well as some calcification aorta and branch vessels.  US Carotids 05/30/2013 with mild to moderate plaque formation is seen involving the proximal internal carotid arteries bilaterally consistent with 50-69%. Stenosis based on Doppler criteria.  Echo 12/27/15  LVEF 60-65%, RV normal. PA pressure 27 mm/Hg  PFTs 12/09/15 FVC 2.44  (77%) FEV1 1.28 (71%) TLC 5.17 DLCO 7.59 (29%)  Review of Systems: [y] = yes, '[ ]'$  = no   General: Weight gain '[ ]'$ ; Weight loss '[ ]'$ ; Anorexia '[ ]'$ ; Fatigue [y]; Fever '[ ]'$ ; Chills '[ ]'$ ; Weakness '[ ]'$   Cardiac: Chest pain/pressure [y]; Resting SOB '[ ]'$ ; Exertional SOB [y]; Orthopnea '[ ]'$ ; Pedal Edema '[ ]'$ ; Palpitations '[ ]'$ ; Syncope '[ ]'$ ; Presyncope '[ ]'$ ; Paroxysmal nocturnal dyspnea'[ ]'$   Pulmonary: Cough '[ ]'$ ; Wheezing'[ ]'$ ; Hemoptysis'[ ]'$ ; Sputum '[ ]'$ ; Snoring '[ ]'$   GI: Vomiting'[ ]'$ ; Dysphagia'[ ]'$ ; Melena'[ ]'$ ; Hematochezia '[ ]'$ ; Heartburn'[ ]'$ ; Abdominal pain [  ]; Constipation '[ ]'$ ; Diarrhea '[ ]'$ ; BRBPR '[ ]'$   GU: Hematuria'[ ]'$ ; Dysuria '[ ]'$ ; Nocturia'[ ]'$   Vascular: Pain in legs with walking Blue.Reese ]; Pain in feet with lying flat '[ ]'$ ; Non-healing sores '[ ]'$ ; Stroke '[ ]'$ ; TIA '[ ]'$ ; Slurred speech '[ ]'$ ;  Neuro: Headaches'[ ]'$ ; Vertigo'[ ]'$ ; Seizures'[ ]'$ ; Paresthesias'[ ]'$ ;Blurred vision '[ ]'$ ; Diplopia '[ ]'$ ; Vision changes '[ ]'$   Ortho/Skin: Arthritis [ y]; Joint pain [ y]; Muscle pain '[ ]'$ ; Joint swelling '[ ]'$ ; Back Pain '[ ]'$ ; Rash '[ ]'$   Psych: Depression'[ ]'$ ; Anxiety'[ ]'$   Heme: Bleeding problems '[ ]'$ ; Clotting disorders '[ ]'$ ; Anemia '[ ]'$   Endocrine: Diabetes '[ ]'$ ; Thyroid dysfunction'[ ]'$    Past Medical History  Diagnosis Date  . Sepsis ([place])   . Pneumonia   . COPD (chronic obstructive pulmonary disease) ([place])   . E. coli pyelonephritis 6//2014    Current Outpatient Prescriptions  Medication Sig Dispense Refill  . Multiple Vitamin (MULTIVITAMIN WITH MINERALS) TABS tablet Take 1 tablet by mouth daily.    Marland Kitchen omeprazole (PRILOSEC) 20 MG capsule Take 20 mg by mouth daily.     . sertraline (ZOLOFT) 25 MG tablet Take 25 mg by mouth daily as needed (anxiety attack).      No current facility-administered medications for this encounter.    No Known Allergies    Social History   Social History  . Marital Status: Married    Spouse Name: N/A  .  Number of Children: N/A  . Years of Education: N/A   Occupational History  . Not on file.   Social History Main Topics  . Smoking status: Current Every Day Smoker -- 0.50 packs/day for 45 years    Types: Cigarettes  . Smokeless tobacco: Not on file  . Alcohol Use: No  . Drug Use: No  . Sexual Activity: No   Other Topics Concern  . Not on file   Social History Narrative      Family History  Problem Relation Age of Onset  . Diabetes Mother     Kayla Sawyer Vitals:   12/27/15 1123  BP: 110/70  Pulse: 95  Resp: 18  Weight: 139 lb (63.05 kg)  SpO2: 95%   Hall work sats 92-93% HR quickly up to 120-130  PHYSICAL EXAM: General:   Walks slowly. No respiratory difficulty HEENT: normal Neck: supple. no JVD. Carotids 2+ bilat; faint right bruit. No lymphadenopathy or thyromegaly appreciated. Cor: PMI nondisplaced. Distant heart sounds. Regular rate & rhythm. No rubs, gallops or murmurs. Lungs: Clear with diminished breath sounds throughout Abdomen: soft, NT, ND, no HSM. No bruits or masses. +BS  Extremities: no cyanosis, clubbing, rash, edema. Telangiectasias on bilateral hands and fingers. Synovial thickening bilateral hands. 2+ DP pulses Neuro: alert & oriented x 3, cranial nerves grossly intact. moves all 4 extremities w/o difficulty. Affect pleasant.   ASSESSMENT & PLAN:   1. Scleroderma - Echo 12/27/15 LVEF 60-65%, PA pressure 27 mm/Hg - She has no echocardiographic stigmata of pulmonary hypertension on Echo - Will order High Res CT to evaluate further why DLCO is markedly decreased (?ILD/COPD) - Would eventually like to get [facility].  - Will also order ABIs with claudication like symptoms.  - Needs repeat US Carotids.  - Ambulatory 02 sat 92-93% on room air. 2. Tobacco Abuse - Needs to stop. 3. DOE 4. Chest pain --Diffuse CAD on chest CT 5 years ago 5. Exertional leg pain --suspicious for claudication   CBC, CMET, TSH, Free T4, and Sed rate today.   [name] "[name]" [place], PA-C 12/27/2015   Patient seen and examined with Oda Kilts, PA-C. We discussed all aspects of the encounter. I agree with the assessment and plan as stated above.   There are multiple issues. I have reviewed PFTs and echo personally. Although DLCO markedly reduced there is no evidence of PH on echo. I suspect this is more likely due to COPD and possible air trapping (versus ILD). Will get hi-res chest CT. She has extensive vascular disease and I worry that some of her symptoms amy be anginal in nature. Will plan R/l heart cath. Also check ABIs and repeat carotid u/s. Will need ASA, statin and counseled on need for smoking cessation.    Bensimhon, Daniel,MD 11:24 PM

## 2015-12-30 ENCOUNTER — Ambulatory Visit (HOSPITAL_COMMUNITY)
Admission: RE | Admit: 2015-12-30 | Discharge: 2015-12-30 | Disposition: A | Discharge status: Home / Self Care | Payer: Medicare Other | Source: Ambulatory Visit | Attending: Internal Medicine | Admitting: Internal Medicine

## 2015-12-30 DIAGNOSIS — J439 Emphysema, unspecified: Secondary | ICD-10-CM | POA: Diagnosis not present

## 2015-12-30 DIAGNOSIS — M349 Systemic sclerosis, unspecified: Secondary | ICD-10-CM

## 2015-12-30 DIAGNOSIS — I313 Pericardial effusion (noninflammatory): Secondary | ICD-10-CM | POA: Diagnosis not present

## 2015-12-30 DIAGNOSIS — I2510 Atherosclerotic heart disease of native coronary artery without angina pectoris: Secondary | ICD-10-CM | POA: Insufficient documentation

## 2015-12-30 DIAGNOSIS — R0602 Shortness of breath: Secondary | ICD-10-CM

## 2015-12-30 DIAGNOSIS — R911 Solitary pulmonary nodule: Secondary | ICD-10-CM | POA: Diagnosis not present

## 2015-12-31 ENCOUNTER — Ambulatory Visit (HOSPITAL_COMMUNITY)
Admission: RE | Admit: 2015-12-31 | Discharge: 2015-12-31 | Disposition: A | Discharge status: Home / Self Care | Payer: Medicare Other | Source: Ambulatory Visit | Attending: Internal Medicine | Admitting: Internal Medicine

## 2015-12-31 ENCOUNTER — Ambulatory Visit (HOSPITAL_BASED_OUTPATIENT_CLINIC_OR_DEPARTMENT_OTHER)
Admission: RE | Admit: 2015-12-31 | Discharge: 2015-12-31 | Disposition: A | Discharge status: Home / Self Care | Payer: Medicare Other | Source: Ambulatory Visit | Attending: Internal Medicine | Admitting: Internal Medicine

## 2015-12-31 ENCOUNTER — Other Ambulatory Visit: Payer: Self-pay | Admitting: Internal Medicine

## 2015-12-31 DIAGNOSIS — R0989 Other specified symptoms and signs involving the circulatory and respiratory systems: Secondary | ICD-10-CM

## 2015-12-31 DIAGNOSIS — I739 Peripheral vascular disease, unspecified: Secondary | ICD-10-CM | POA: Diagnosis not present

## 2015-12-31 NOTE — Progress Notes (Addendum)
*  PRELIMINARY RESULTS* Vascular Ultrasound Carotid Duplex (Doppler) has been completed.   Findings suggest 1-39% internal carotid artery stenosis bilaterally. Vertebral arteries are patent with antegrade flow.   ARTERIAL  ABI completed:    RIGHT    LEFT    PRESSURE WAVEFORM  PRESSURE WAVEFORM  BRACHIAL 112 Triphasic BRACHIAL 115 Triphasic  DP 69 Monophasic DP 52 Dampened monophasic  AT   AT    PT 40 Monophasic PT 46 Dampened monophasic  PER   PER    GREAT TOE  NA GREAT TOE  NA    RIGHT LEFT  ABI 0.6 0.45   The right ABI is suggestive of moderate arterial insufficiency at rest, the left ABI is suggestive of severe arterial insufficiency at rest.  Preliminary results discussed with Dr. Haroldine Laws.  12/31/2015 10:57 AM Maudry Mayhew, RVT, RDCS, RDMS    Vascular Ultrasound Lower Extremity Arterial Duplex has been completed.   There is no obvious evidence of hemodynamically significant stenosis involving visualized bilateral lower extremity arteries. Bilateral external iliac arteries exhibit monophasic flow, suggestive of proximal obstruction.  12/31/2015 12:10 PM Maudry Mayhew, RVT, RDCS, RDMS

## 2016-01-03 ENCOUNTER — Encounter (HOSPITAL_COMMUNITY): Payer: Self-pay | Admitting: Internal Medicine

## 2016-01-03 ENCOUNTER — Encounter (HOSPITAL_COMMUNITY)
Admission: RE | Disposition: A | Discharge status: Home / Self Care | Payer: Self-pay | Source: Ambulatory Visit | Attending: Internal Medicine

## 2016-01-03 ENCOUNTER — Ambulatory Visit (HOSPITAL_COMMUNITY)
Admission: RE | Admit: 2016-01-03 | Discharge: 2016-01-03 | Disposition: A | Discharge status: Home / Self Care | Payer: Medicare Other | Source: Ambulatory Visit | Attending: Internal Medicine | Admitting: Internal Medicine

## 2016-01-03 DIAGNOSIS — R079 Chest pain, unspecified: Secondary | ICD-10-CM | POA: Diagnosis not present

## 2016-01-03 DIAGNOSIS — R0600 Dyspnea, unspecified: Secondary | ICD-10-CM | POA: Insufficient documentation

## 2016-01-03 DIAGNOSIS — J449 Chronic obstructive pulmonary disease, unspecified: Secondary | ICD-10-CM | POA: Insufficient documentation

## 2016-01-03 DIAGNOSIS — I2510 Atherosclerotic heart disease of native coronary artery without angina pectoris: Secondary | ICD-10-CM

## 2016-01-03 DIAGNOSIS — M349 Systemic sclerosis, unspecified: Secondary | ICD-10-CM | POA: Insufficient documentation

## 2016-01-03 DIAGNOSIS — F17210 Nicotine dependence, cigarettes, uncomplicated: Secondary | ICD-10-CM | POA: Diagnosis not present

## 2016-01-03 HISTORY — PX: CARDIAC CATHETERIZATION: SHX172

## 2016-01-03 LAB — POCT I-STAT 3, VENOUS BLOOD GAS (G3P V)
Acid-Base Excess: 1 mmol/L (ref 0.0–2.0)
Acid-base deficit: 2 mmol/L (ref 0.0–2.0)
Bicarbonate: 23.6 mEq/L (ref 20.0–24.0)
Bicarbonate: 26.6 mEq/L — ABNORMAL HIGH (ref 20.0–24.0)
O2 Saturation: 58 %
O2 Saturation: 58 %
TCO2: 25 mmol/L (ref 0–100)
TCO2: 28 mmol/L (ref 0–100)
pCO2, Ven: 43.6 mmHg — ABNORMAL LOW (ref 45.0–50.0)
pCO2, Ven: 46.7 mmHg (ref 45.0–50.0)
pH, Ven: 7.342 — ABNORMAL HIGH (ref 7.250–7.300)
pH, Ven: 7.363 — ABNORMAL HIGH (ref 7.250–7.300)
pO2, Ven: 32 mmHg (ref 31.0–45.0)
pO2, Ven: 32 mmHg (ref 31.0–45.0)

## 2016-01-03 LAB — POCT I-STAT 3, ART BLOOD GAS (G3+)
Acid-base deficit: 4 mmol/L — ABNORMAL HIGH (ref 0.0–2.0)
Bicarbonate: 21.3 mEq/L (ref 20.0–24.0)
O2 Saturation: 90 %
TCO2: 22 mmol/L (ref 0–100)
pCO2 arterial: 37.9 mmHg (ref 35.0–45.0)
pH, Arterial: 7.358 (ref 7.350–7.450)
pO2, Arterial: 60 mmHg — ABNORMAL LOW (ref 80.0–100.0)

## 2016-01-03 LAB — PROTIME-INR
INR: 1.02 (ref 0.00–1.49)
Prothrombin Time: 13.6 seconds (ref 11.6–15.2)

## 2016-01-03 SURGERY — RIGHT/LEFT HEART CATH AND CORONARY ANGIOGRAPHY
Anesthesia: LOCAL

## 2016-01-03 MED ORDER — IOPAMIDOL (ISOVUE-370) INJECTION 76%
INTRAVENOUS | Status: AC
  Filled 2016-01-03: qty 50

## 2016-01-03 MED ORDER — ASPIRIN 81 MG PO CHEW
CHEWABLE_TABLET | ORAL | Status: AC
  Filled 2016-01-03: qty 1

## 2016-01-03 MED ORDER — SODIUM CHLORIDE 0.9 % IV SOLN: 250.0000 mL | INTRAVENOUS | Status: DC | PRN

## 2016-01-03 MED ORDER — ASPIRIN 81 MG PO CHEW
324.0000 mg | CHEWABLE_TABLET | ORAL | Status: AC
  Administered 2016-01-03: 324 mg via ORAL

## 2016-01-03 MED ORDER — MIDAZOLAM HCL 2 MG/2ML IJ SOLN
INTRAMUSCULAR | Status: DC | PRN
  Administered 2016-01-03 (×2): 1 mg via INTRAVENOUS

## 2016-01-03 MED ORDER — ONDANSETRON HCL 4 MG/2ML IJ SOLN: 4.0000 mg | Freq: Four times a day (QID) | INTRAMUSCULAR | Status: DC | PRN

## 2016-01-03 MED ORDER — LIDOCAINE HCL (PF) 1 % IJ SOLN
INTRAMUSCULAR | Status: AC
  Filled 2016-01-03: qty 30

## 2016-01-03 MED ORDER — MIDAZOLAM HCL 2 MG/2ML IJ SOLN
INTRAMUSCULAR | Status: AC
  Filled 2016-01-03: qty 2

## 2016-01-03 MED ORDER — HEPARIN SODIUM (PORCINE) 1000 UNIT/ML IJ SOLN
INTRAMUSCULAR | Status: DC | PRN
  Administered 2016-01-03: 3000 [IU] via INTRAVENOUS

## 2016-01-03 MED ORDER — ACETAMINOPHEN 325 MG PO TABS: 650.0000 mg | ORAL_TABLET | ORAL | Status: DC | PRN

## 2016-01-03 MED ORDER — SODIUM CHLORIDE 0.9% FLUSH: 3.0000 mL | Freq: Two times a day (BID) | INTRAVENOUS | Status: DC

## 2016-01-03 MED ORDER — HEPARIN (PORCINE) IN NACL 2-0.9 UNIT/ML-% IJ SOLN
INTRAMUSCULAR | Status: AC
  Filled 2016-01-03: qty 1500

## 2016-01-03 MED ORDER — SODIUM CHLORIDE 0.9 % IV SOLN
INTRAVENOUS | Status: DC
Start: 2016-01-04 — End: 2016-01-03
  Administered 2016-01-03: 09:00:00 via INTRAVENOUS

## 2016-01-03 MED ORDER — VERAPAMIL HCL 2.5 MG/ML IV SOLN
INTRAVENOUS | Status: AC
  Filled 2016-01-03: qty 2

## 2016-01-03 MED ORDER — SODIUM CHLORIDE 0.9 % IV SOLN: INTRAVENOUS | Status: DC

## 2016-01-03 MED ORDER — FENTANYL CITRATE (PF) 100 MCG/2ML IJ SOLN
INTRAMUSCULAR | Status: AC
  Filled 2016-01-03: qty 2

## 2016-01-03 MED ORDER — LIDOCAINE HCL (PF) 1 % IJ SOLN
INTRAMUSCULAR | Status: DC | PRN
  Administered 2016-01-03 (×2): 2 mL via INTRADERMAL

## 2016-01-03 MED ORDER — ASPIRIN 81 MG PO CHEW
CHEWABLE_TABLET | ORAL | Status: AC
  Filled 2016-01-03: qty 3

## 2016-01-03 MED ORDER — VERAPAMIL HCL 2.5 MG/ML IV SOLN
INTRAVENOUS | Status: DC | PRN
  Administered 2016-01-03: 12:00:00 via INTRA_ARTERIAL

## 2016-01-03 MED ORDER — IOPAMIDOL (ISOVUE-370) INJECTION 76%
INTRAVENOUS | Status: AC
  Filled 2016-01-03: qty 100

## 2016-01-03 MED ORDER — FENTANYL CITRATE (PF) 100 MCG/2ML IJ SOLN
INTRAMUSCULAR | Status: DC | PRN
  Administered 2016-01-03: 25 ug via INTRAVENOUS

## 2016-01-03 MED ORDER — SODIUM CHLORIDE 0.9% FLUSH: 3.0000 mL | INTRAVENOUS | Status: DC | PRN

## 2016-01-03 MED ORDER — HEPARIN (PORCINE) IN NACL 2-0.9 UNIT/ML-% IJ SOLN
INTRAMUSCULAR | Status: DC | PRN
  Administered 2016-01-03: 1500 mL

## 2016-01-03 MED ORDER — HEPARIN SODIUM (PORCINE) 1000 UNIT/ML IJ SOLN
INTRAMUSCULAR | Status: AC
  Filled 2016-01-03: qty 1

## 2016-01-03 MED ORDER — IOPAMIDOL (ISOVUE-370) INJECTION 76%
INTRAVENOUS | Status: DC | PRN
  Administered 2016-01-03: 115 mL via INTRA_ARTERIAL

## 2016-01-03 SURGICAL SUPPLY — 16 items
CATH BALLN WEDGE 5F 110CM (CATHETERS) ×2 IMPLANT
CATH INFINITI 5 FR JL3.5 (CATHETERS) ×2 IMPLANT
CATH INFINITI 5 FR STR PIGTAIL (CATHETERS) ×2 IMPLANT
CATH INFINITI 5FR JL4 (CATHETERS) ×2 IMPLANT
CATH INFINITI JR4 5F (CATHETERS) ×2 IMPLANT
CATH LAUNCHER 5F JL3 (CATHETERS) ×1 IMPLANT
CATHETER LAUNCHER 5F JL3 (CATHETERS) ×2
DEVICE RAD COMP TR BAND LRG (VASCULAR PRODUCTS) ×2 IMPLANT
GLIDESHEATH SLEND SS 6F .021 (SHEATH) ×2 IMPLANT
KIT HEART LEFT (KITS) ×2 IMPLANT
PACK CARDIAC CATHETERIZATION (CUSTOM PROCEDURE TRAY) ×2 IMPLANT
SHEATH FAST CATH BRACH 5F 5CM (SHEATH) ×2 IMPLANT
TRANSDUCER W/STOPCOCK (MISCELLANEOUS) ×2 IMPLANT
TUBING CIL FLEX 10 FLL-RA (TUBING) ×2 IMPLANT
WIRE HI TORQ VERSACORE-J 145CM (WIRE) ×2 IMPLANT
WIRE SAFE-T 1.5MM-J .035X260CM (WIRE) ×2 IMPLANT

## 2016-01-03 NOTE — Progress Notes (Signed)
Brachial sheath removed. 30 minutes of manual pressure was held to achieve homeostasis. VSS. Pt instructions given and verbalized understanding. Pressure dressing applied.   Will continue to monitor.   Earlie Lou

## 2016-01-03 NOTE — Interval H&P Note (Signed)
History and Physical Interval Note:  01/03/2016 11:16 AM  Kayla Sawyer  has presented today for surgery, with the diagnosis of chf  The various methods of treatment have been discussed with the patient and family. After consideration of risks, benefits and other options for treatment, the patient has consented to  Procedure(s): Right/Left Heart Cath and Coronary Angiography (N/A)  And possible angioplasty  as a surgical intervention .  The patient's history has been reviewed, patient examined, no change in status, stable for surgery.  I have reviewed the patient's chart and labs.  Questions were answered to the patient's satisfaction.     Bensimhon, Quillian Quince

## 2016-01-03 NOTE — Research (Signed)
CAD LAD Informed Consent   Subject Name: JOSEPHYNE TARTER  Subject met inclusion and exclusion criteria.  The informed consent form, study requirements and expectations were reviewed with the subject and questions and concerns were addressed prior to the signing of the consent form.  The subject verbalized understanding of the trail requirements.  The subject agreed to participate in the CAD LAd trial and signed the informed consent.  The informed consent was obtained prior to performance of any protocol-specific procedures for the subject.  A copy of the signed informed consent was given to the subject and a copy was placed in the subject's medical record.  Hedrick,Tammy W 01/03/2016, [id number]

## 2016-01-03 NOTE — Discharge Instructions (Signed)
Radial Site Care °Refer to this sheet in the next few weeks. These instructions provide you with information about caring for yourself after your procedure. Your health care provider may also give you more specific instructions. Your treatment has been planned according to current medical practices, but problems sometimes occur. Call your health care provider if you have any problems or questions after your procedure. °WHAT TO EXPECT AFTER THE PROCEDURE °After your procedure, it is typical to have the following: °· Bruising at the radial site that usually fades within 1-2 weeks. °· Blood collecting in the tissue (hematoma) that may be painful to the touch. It should usually decrease in size and tenderness within 1-2 weeks. °HOME CARE INSTRUCTIONS °· Take medicines only as directed by your health care provider. °· You may shower 24-48 hours after the procedure or as directed by your health care provider. Remove the bandage (dressing) and gently wash the site with plain soap and water. Pat the area dry with a clean towel. Do not rub the site, because this may cause bleeding. °· Do not take baths, swim, or use a hot tub until your health care provider approves. °· Check your insertion site every day for redness, swelling, or drainage. °· Do not apply powder or lotion to the site. °· Do not flex or bend the affected arm for 24 hours or as directed by your health care provider. °· Do not push or pull heavy objects with the affected arm for 24 hours or as directed by your health care provider. °· Do not lift over 10 lb (4.5 kg) for 5 days after your procedure or as directed by your health care provider. °· Ask your health care provider when it is okay to: °¨ Return to work or school. °¨ Resume usual physical activities or sports. °¨ Resume sexual activity. °· Do not drive home if you are discharged the same day as the procedure. Have someone else drive you. °· You may drive 24 hours after the procedure unless otherwise  instructed by your health care provider. °· Do not operate machinery or power tools for 24 hours after the procedure. °· If your procedure was done as an outpatient procedure, which means that you went home the same day as your procedure, a responsible adult should be with you for the first 24 hours after you arrive home. °· Keep all follow-up visits as directed by your health care provider. This is important. °SEEK MEDICAL CARE IF: °· You have a fever. °· You have chills. °· You have increased bleeding from the radial site. Hold pressure on the site. CALL 911 °SEEK IMMEDIATE MEDICAL CARE IF: °· You have unusual pain at the radial site. °· You have redness, warmth, or swelling at the radial site. °· You have drainage (other than a small amount of blood on the dressing) from the radial site. °· The radial site is bleeding, and the bleeding does not stop after 30 minutes of holding steady pressure on the site. °· Your arm or hand becomes pale, cool, tingly, or numb. °  °This information is not intended to replace advice given to you by your health care provider. Make sure you discuss any questions you have with your health care provider. °  °Document Released: 08/05/2010 Document Revised: 07/24/2014 Document Reviewed: 01/19/2014 °Elsevier Interactive Patient Education ©2016 [employer] ° °

## 2016-01-03 NOTE — H&P (View-Only) (Signed)
Patient ID: Kayla Sawyer, female   DOB: April 30, 1946, 70 y.o.   MRN: [id number]    Advanced Heart Failure Clinic Note   Referring Physician: Dr Gavin Pound Primary Cardiologist: New  HPI:  Kayla Sawyer is a 70 y.o. female with history of probable scleroderma sent for evaluation of Pulmonary HTN. Referred by Dr Trudie Reed.    She is a life-long smoker. Denies h/o known CAD. However was admitted several years ago for pyelonephritis and CT scans show diffuse coronary calcifications and mesenteric atherosclerosis. Also had carotid u/s at that time with bilateral 40-59% stenoses.   States she has "no energy". Fatigued with any exertion. + DOE. Wears pulse ox and sats 92-93%. HR fast. Sometimes has chest pains in the night, last time this happened was a month ago. Has pain throughout legs with walking moderate distances that improve with rest. Smokes 1/2 ppd + and has for 50 years. Had PFTs with relatively normal spirometry and DLCO 29% concerning for PAH in setting of scleroderma.  CT chest 07/07/11 shows heavy calcification of SMA, as well as some calcification aorta and branch vessels.  US Carotids 05/30/2013 with mild to moderate plaque formation is seen involving the proximal internal carotid arteries bilaterally consistent with 50-69%. Stenosis based on Doppler criteria.  Echo 12/27/15  LVEF 60-65%, RV normal. PA pressure 27 mm/Hg  PFTs 12/09/15 FVC 2.44  (77%) FEV1 1.28 (71%) TLC 5.17 DLCO 7.59 (29%)  Review of Systems: [y] = yes, '[ ]'$  = no   General: Weight gain '[ ]'$ ; Weight loss '[ ]'$ ; Anorexia '[ ]'$ ; Fatigue [y]; Fever '[ ]'$ ; Chills '[ ]'$ ; Weakness '[ ]'$   Cardiac: Chest pain/pressure [y]; Resting SOB '[ ]'$ ; Exertional SOB [y]; Orthopnea '[ ]'$ ; Pedal Edema '[ ]'$ ; Palpitations '[ ]'$ ; Syncope '[ ]'$ ; Presyncope '[ ]'$ ; Paroxysmal nocturnal dyspnea'[ ]'$   Pulmonary: Cough '[ ]'$ ; Wheezing'[ ]'$ ; Hemoptysis'[ ]'$ ; Sputum '[ ]'$ ; Snoring '[ ]'$   GI: Vomiting'[ ]'$ ; Dysphagia'[ ]'$ ; Melena'[ ]'$ ; Hematochezia '[ ]'$ ; Heartburn'[ ]'$ ; Abdominal pain [  ]; Constipation '[ ]'$ ; Diarrhea '[ ]'$ ; BRBPR '[ ]'$   GU: Hematuria'[ ]'$ ; Dysuria '[ ]'$ ; Nocturia'[ ]'$   Vascular: Pain in legs with walking Blue.Reese ]; Pain in feet with lying flat '[ ]'$ ; Non-healing sores '[ ]'$ ; Stroke '[ ]'$ ; TIA '[ ]'$ ; Slurred speech '[ ]'$ ;  Neuro: Headaches'[ ]'$ ; Vertigo'[ ]'$ ; Seizures'[ ]'$ ; Paresthesias'[ ]'$ ;Blurred vision '[ ]'$ ; Diplopia '[ ]'$ ; Vision changes '[ ]'$   Ortho/Skin: Arthritis [ y]; Joint pain [ y]; Muscle pain '[ ]'$ ; Joint swelling '[ ]'$ ; Back Pain '[ ]'$ ; Rash '[ ]'$   Psych: Depression'[ ]'$ ; Anxiety'[ ]'$   Heme: Bleeding problems '[ ]'$ ; Clotting disorders '[ ]'$ ; Anemia '[ ]'$   Endocrine: Diabetes '[ ]'$ ; Thyroid dysfunction'[ ]'$    Past Medical History  Diagnosis Date  . Sepsis ([place])   . Pneumonia   . COPD (chronic obstructive pulmonary disease) ([place])   . E. coli pyelonephritis 6//2014    Current Outpatient Prescriptions  Medication Sig Dispense Refill  . Multiple Vitamin (MULTIVITAMIN WITH MINERALS) TABS tablet Take 1 tablet by mouth daily.    Marland Kitchen omeprazole (PRILOSEC) 20 MG capsule Take 20 mg by mouth daily.     . sertraline (ZOLOFT) 25 MG tablet Take 25 mg by mouth daily as needed (anxiety attack).      No current facility-administered medications for this encounter.    No Known Allergies    Social History   Social History  . Marital Status: Married    Spouse Name: N/A  .  Number of Children: N/A  . Years of Education: N/A   Occupational History  . Not on file.   Social History Main Topics  . Smoking status: Current Every Day Smoker -- 0.50 packs/day for 45 years    Types: Cigarettes  . Smokeless tobacco: Not on file  . Alcohol Use: No  . Drug Use: No  . Sexual Activity: No   Other Topics Concern  . Not on file   Social History Narrative      Family History  Problem Relation Age of Onset  . Diabetes Mother     Danley Danker Vitals:   12/27/15 1123  BP: 110/70  Pulse: 95  Resp: 18  Weight: 139 lb (63.05 kg)  SpO2: 95%   Hall work sats 92-93% HR quickly up to 120-130  PHYSICAL EXAM: General:   Walks slowly. No respiratory difficulty HEENT: normal Neck: supple. no JVD. Carotids 2+ bilat; faint right bruit. No lymphadenopathy or thyromegaly appreciated. Cor: PMI nondisplaced. Distant heart sounds. Regular rate & rhythm. No rubs, gallops or murmurs. Lungs: Clear with diminished breath sounds throughout Abdomen: soft, NT, ND, no HSM. No bruits or masses. +BS  Extremities: no cyanosis, clubbing, rash, edema. Telangiectasias on bilateral hands and fingers. Synovial thickening bilateral hands. 2+ DP pulses Neuro: alert & oriented x 3, cranial nerves grossly intact. moves all 4 extremities w/o difficulty. Affect pleasant.   ASSESSMENT & PLAN:   1. Scleroderma - Echo 12/27/15 LVEF 60-65%, PA pressure 27 mm/Hg - She has no echocardiographic stigmata of pulmonary hypertension on Echo - Will order High Res CT to evaluate further why DLCO is markedly decreased (?ILD/COPD) - Would eventually like to get [facility].  - Will also order ABIs with claudication like symptoms.  - Needs repeat US Carotids.  - Ambulatory 02 sat 92-93% on room air. 2. Tobacco Abuse - Needs to stop. 3. DOE 4. Chest pain --Diffuse CAD on chest CT 5 years ago 5. Exertional leg pain --suspicious for claudication   CBC, CMET, TSH, Free T4, and Sed rate today.   [name] "[name]" [place]ston, PA-C 12/27/2015   Patient seen and examined with Oda Kilts, PA-C. We discussed all aspects of the encounter. I agree with the assessment and plan as stated above.   There are multiple issues. I have reviewed PFTs and echo personally. Although DLCO markedly reduced there is no evidence of PH on echo. I suspect this is more likely due to COPD and possible air trapping (versus ILD). Will get hi-res chest CT. She has extensive vascular disease and I worry that some of her symptoms amy be anginal in nature. Will plan R/l heart cath. Also check ABIs and repeat carotid u/s. Will need ASA, statin and counseled on need for smoking cessation.    Julieth Tugman,MD 11:24 PM

## 2016-01-06 ENCOUNTER — Telehealth (HOSPITAL_COMMUNITY): Payer: Self-pay | Admitting: *Deleted

## 2016-01-06 DIAGNOSIS — I739 Peripheral vascular disease, unspecified: Secondary | ICD-10-CM

## 2016-01-06 DIAGNOSIS — J8489 Other specified interstitial pulmonary diseases: Secondary | ICD-10-CM

## 2016-01-06 NOTE — Telephone Encounter (Signed)
-----   Message from Jolaine Artist, MD sent at 12/30/2015  9:56 PM EDT ----- Please refer to Dr. Chase Caller in Pulmonary

## 2016-01-06 NOTE — Telephone Encounter (Signed)
Notes Recorded by Scarlette Calico, RN on 01/06/2016 at 11:22 AM Pt aware, referral placed

## 2016-01-06 NOTE — Telephone Encounter (Signed)
Notes Recorded by Scarlette Calico, RN on 01/06/2016 at 11:28 AM Pt aware, referral placed Notes Recorded by Jolaine Artist, MD on 01/04/2016 at 3:45 PM Please refer to Dr. Fletcher Anon

## 2016-01-07 ENCOUNTER — Ambulatory Visit (INDEPENDENT_AMBULATORY_CARE_PROVIDER_SITE_OTHER): Payer: Medicare Other | Admitting: Internal Medicine

## 2016-01-07 ENCOUNTER — Encounter: Payer: Self-pay | Admitting: Internal Medicine

## 2016-01-07 VITALS — BP 124/66 | HR 85 | Ht 68.0 in | Wt 140.0 lb

## 2016-01-07 DIAGNOSIS — R0600 Dyspnea, unspecified: Secondary | ICD-10-CM

## 2016-01-07 DIAGNOSIS — J432 Centrilobular emphysema: Secondary | ICD-10-CM | POA: Diagnosis not present

## 2016-01-07 DIAGNOSIS — R5381 Other malaise: Secondary | ICD-10-CM | POA: Diagnosis not present

## 2016-01-07 DIAGNOSIS — Z87891 Personal history of nicotine dependence: Secondary | ICD-10-CM

## 2016-01-07 DIAGNOSIS — J849 Interstitial pulmonary disease, unspecified: Secondary | ICD-10-CM | POA: Diagnosis not present

## 2016-01-07 NOTE — Patient Instructions (Addendum)
ICD-9-CM ICD-10-CM   1. Dyspnea 786.09 R06.00   2. Centrilobular emphysema (HCC) 492.8 J43.2   3. ILD (interstitial lung disease) ([place]) 515 J84.9   4. Physical deconditioning 799.3 R53.81   5. Stopped smoking with greater than 40 pack year history V15.82 Z87.891    Treat emphysema and deconditioning for now Monitor ILD for now   - basis unclear; could be scleroderma related. couild be smking related, could be idiopathic  PLAN Quit smoking Start spiriva respimat daily No indication for exertional oxygen Refer pulmonary rehab at [facility] query records from Dr Gavin Pound for autoimmune eval - pls sign release  Followup  - 3 months to review progress based on symptoms  - will decide on fu CT chest at that time

## 2016-01-07 NOTE — Progress Notes (Signed)
Subjective:    Patient ID: BARBRA MINER, female    DOB: [birth year], 70 y.o.   MRN: [id number]  PCP Wende Neighbors, MD   HPI   IOV 01/07/2016  Chief Complaint  Patient presents with  . Advice Only    Referred by Dr. Haroldine Laws for nsip.      70 year old female presents with her daughter [name] was discharged as of the emergency department at [facility]. At baseline she uses a walker for doing groceries or walking in the mall. She has chronic stable dyspnea for many years. She has a previous history of emphysema for which she took Advair but does not take it for many years. The dyspnea is only moderate.. But she also has chronic cough for the last several months it is associated. Cough is mild. More recently she has had fatigue and progression of dyspnea associated with skin lesions. She apparently saw Dr. Gavin Pound in rheumatology. Scleroderma was entertained but autoimmune panel has been negative. She subsequently saw Dr. Haroldine Laws. Echo suggested possible elevation in pulmonary artery pressure and therefore she underwent a right heart cath which is documented below. Mean pulmonary artery pressure is only 26 mmHg just at the cusp of normal/abnormal. She had CT scan of the chest 12/30/2015 that shows a 6 mm spiculated right upper lobe nodule and a patulous esophagus suggesting scleroderma. Therefore the diagnosis of scleroderma is being entertained again. In addition she has possible UIP pattern and emphysema on CT chest. She admits to significant physical deconditioning.      Pulmonary function test 12/09/2015 FVC 2.4 L/77%, ratio of 68 suggesting obstruction FEV1 1.06 L/44%. Total lung capacity 5.17/99%, DLCO 7.6 L/29%  High-resolution CT chest 12/30/2015 shows diffuse emphysema bilaterally associated with non-UIP pattern but possible UIP pattern ILD the bases. Looking back this might of started in 2012 CT chest   Cardiac catheterization results 01/03/2016 reviewed mean  pulmonary artery pressure 26 mmHg and she has nonobstructive coronary artery disease  Walking desaturation test on 01/07/2016 185 feet x 3 laps:  did NOT desaturate. Rest pulse ox was 94, final pulse ox was 92%. HR response 81/min at rest to 116 /min at peak exertion. = - Walk only one lap and stopped due to significant fatigue and back significantly tachycardic.    has a past medical history of Sepsis ([place]); Pneumonia; COPD (chronic obstructive pulmonary disease) ([place]); and E. coli pyelonephritis (6//2014).   reports that she has been smoking Cigarettes.  She has a 50 pack-year smoking history. She has never used smokeless tobacco.  Past Surgical History  Procedure Laterality Date  . Elbow surgery    . Abdominal hysterectomy      partial  . Back surgery      L4/5  . Colonoscopy N/A 08/05/2015    Procedure: COLONOSCOPY;  Surgeon: Rogene Houston, MD;  [facility];  Service: Endoscopy;  Laterality: N/A;  730  . Cardiac catheterization N/A 01/03/2016    Procedure: Right/Left Heart Cath and Coronary Angiography;  Surgeon: Jolaine Artist, MD;  Location: [place] [facility] LAB;  Service: Cardiovascular;  Laterality: N/A;    Allergies  Allergen Reactions  . Sulfur Nausea Only    Immunization History  Administered Date(s) Administered  . Influenza Split 07/08/2011, 07/09/2015  . Pneumococcal Conjugate-13 07/09/2015  . Pneumococcal Polysaccharide-23 07/08/2011    Family History  Problem Relation Age of Onset  . Diabetes Mother   . Emphysema Father      Current outpatient prescriptions:  .  cholecalciferol (VITAMIN D) 1000 units tablet, Take 1,000 Units by mouth daily., Disp: , Rfl:  .  Multiple Vitamin (MULTIVITAMIN WITH MINERALS) TABS tablet, Take 1 tablet by mouth daily., Disp: , Rfl:  .  omeprazole (PRILOSEC) 20 MG capsule, Take 20 mg by mouth daily. , Disp: , Rfl:  .  sertraline (ZOLOFT) 25 MG tablet, Take 25 mg by mouth daily as needed (anxiety attack). , Disp: ,  Rfl:       Review of Systems  Constitutional: Negative for fever and unexpected weight change.  HENT: Positive for congestion. Negative for dental problem, ear pain, nosebleeds, postnasal drip, rhinorrhea, sinus pressure, sneezing, sore throat and trouble swallowing.   Eyes: Negative for redness and itching.  Respiratory: Positive for cough and shortness of breath. Negative for chest tightness and wheezing.   Cardiovascular: Negative for palpitations and leg swelling.  Gastrointestinal: Negative for nausea and vomiting.  Genitourinary: Negative for dysuria.  Musculoskeletal: Negative for joint swelling.  Skin: Negative for rash.  Neurological: Negative for headaches.  Hematological: Does not bruise/bleed easily.  Psychiatric/Behavioral: Negative for dysphoric mood. The patient is not nervous/anxious.        Objective:   Physical Exam  Constitutional: She is oriented to person, place, and time. She appears well-developed and well-nourished. No distress.  frail  HENT:  Head: Normocephalic and atraumatic.  Right Ear: External ear normal.  Left Ear: External ear normal.  Mouth/Throat: Oropharynx is clear and moist. No oropharyngeal exudate.  Eyes: Conjunctivae and EOM are normal. Pupils are equal, round, and reactive to light. Right eye exhibits no discharge. Left eye exhibits no discharge. No scleral icterus.  Neck: Normal range of motion. Neck supple. No JVD present. No tracheal deviation present. No thyromegaly present.  Cardiovascular: Normal rate, regular rhythm, normal heart sounds and intact distal pulses.  Exam reveals no gallop and no friction rub.   No murmur heard. Pulmonary/Chest: Effort normal. No respiratory distress. She has no wheezes. She has rales. She exhibits no tenderness.  Basal crackles +  Abdominal: Soft. Bowel sounds are normal. She exhibits no distension and no mass. There is no tenderness. There is no rebound and no guarding.  Musculoskeletal: Normal range  of motion. She exhibits no edema or tenderness.  Frail Has walker  Lymphadenopathy:    She has no cervical adenopathy.  Neurological: She is alert and oriented to person, place, and time. She has normal reflexes. No cranial nerve deficit. She exhibits normal muscle tone. Coordination normal.  Skin: Skin is warm and dry. No rash noted. She is not diaphoretic. No erythema. No pallor.  No hardened skin Some red spots  Psychiatric: She has a normal mood and affect. Her behavior is normal. Judgment and thought content normal.  Vitals reviewed.   Filed Vitals:   01/07/16 1432  BP: 124/66  Pulse: 85  Height: '5\' 8"'$  (1.727 m)  Weight: 140 lb (63.504 kg)  SpO2: 94%   Estimated body mass index is 21.29 kg/(m^2) as calculated from the following:   Height as of this encounter: '5\' 8"'$  (1.727 m).   Weight as of this encounter: 140 lb (63.504 kg).        Assessment & Plan:     ICD-9-CM ICD-10-CM   1. Dyspnea 786.09 R06.00   2. Centrilobular emphysema (HCC) 492.8 J43.2   3. ILD (interstitial lung disease) ([place]) 515 J84.9   4. Physical deconditioning 799.3 R53.81   5. Stopped smoking with greater than 40 pack year history V15.82 Z87.891  Treat emphysema and deconditioning for now Monitor ILD for now   - basis unclear; could be scleroderma related. couild be smking related, could be idiopathic  PLAN Quit smoking Start spiriva respimat daily No indication for exertional oxygen Refer pulmonary rehab at [facility] query records from Dr Gavin Pound for autoimmune eval - pls sign release  Followup  - 3 months to review progress based on symptoms  - will decide on fu CT chest at that time   Dr. Brand Males, M.D., [employer].C.P Pulmonary and Critical Care Medicine Staff Physician [facility] Pager: [phone], If no answer or between  15:00h - 7:00h: call [phone]  01/07/2016 3:16 PM

## 2016-01-07 NOTE — Addendum Note (Signed)
Addended by: Len Blalock on: 01/07/2016 03:24 PM   Modules accepted: Orders

## 2016-02-08 ENCOUNTER — Ambulatory Visit (INDEPENDENT_AMBULATORY_CARE_PROVIDER_SITE_OTHER): Payer: Medicare Other | Admitting: Cardiovascular Disease

## 2016-02-08 ENCOUNTER — Encounter: Payer: Self-pay | Admitting: Cardiovascular Disease

## 2016-02-08 VITALS — BP 93/61 | Ht 68.0 in | Wt 140.0 lb

## 2016-02-08 DIAGNOSIS — E785 Hyperlipidemia, unspecified: Secondary | ICD-10-CM

## 2016-02-08 DIAGNOSIS — Z01818 Encounter for other preprocedural examination: Secondary | ICD-10-CM | POA: Diagnosis not present

## 2016-02-08 DIAGNOSIS — I739 Peripheral vascular disease, unspecified: Secondary | ICD-10-CM | POA: Diagnosis not present

## 2016-02-08 DIAGNOSIS — Z720 Tobacco use: Secondary | ICD-10-CM | POA: Diagnosis not present

## 2016-02-08 LAB — CBC
HCT: 44.8 % (ref 35.0–45.0)
Hemoglobin: 14.9 g/dL (ref 11.7–15.5)
MCH: 30.2 pg (ref 27.0–33.0)
MCHC: 33.3 g/dL (ref 32.0–36.0)
MCV: 90.7 fL (ref 80.0–100.0)
MPV: 10.8 fL (ref 7.5–12.5)
Platelets: 254 10*3/uL (ref 140–400)
RBC: 4.94 MIL/uL (ref 3.80–5.10)
RDW: 14.9 % (ref 11.0–15.0)
WBC: 7.8 10*3/uL (ref 3.8–10.8)

## 2016-02-08 LAB — BASIC METABOLIC PANEL
BUN: 26 mg/dL — ABNORMAL HIGH (ref 7–25)
CO2: 24 mmol/L (ref 20–31)
Calcium: 9.5 mg/dL (ref 8.6–10.4)
Chloride: 105 mmol/L (ref 98–110)
Creat: 0.9 mg/dL (ref 0.50–0.99)
Glucose, Bld: 94 mg/dL (ref 65–99)
Potassium: 4.3 mmol/L (ref 3.5–5.3)
Sodium: 141 mmol/L (ref 135–146)

## 2016-02-08 MED ORDER — ATORVASTATIN CALCIUM 20 MG PO TABS: 20.0000 mg | ORAL_TABLET | Freq: Every day | ORAL | 3 refills | Status: DC

## 2016-02-08 MED ORDER — ASPIRIN EC 81 MG PO TBEC: 81.0000 mg | DELAYED_RELEASE_TABLET | Freq: Every day | ORAL | Status: DC

## 2016-02-08 NOTE — Progress Notes (Signed)
Cardiology Office Note   Date:  02/08/2016   ID:  Kayla Sawyer, Kayla Sawyer 1946-05-22, MRN [id number]  PCP:  Wende Neighbors, MD  Cardiologist:   Kathlyn Sacramento, MD   Chief Complaint  Patient presents with  . New Evaluation    blockages in legs      History of Present Illness: Kayla Sawyer is a 70 y.o. female who Was referred by Dr. Haroldine Laws for evaluation and management of claudication and peripheral arterial disease. The patient has extensive history of tobacco use and has been smoking for at least 50 years. She also has COPD and suspected scleroderma. She recently underwent a right and left cardiac catheterization which showed mild nonobstructive coronary artery disease and no evidence of pulmonary hypertension. She was seen by pulmonary who recommended pulmonary rehabilitation. However, the patient reports inability to walk even a short distance due to severe bilateral leg claudication. She gets bilateral leg pain even walking from one room to another. This became very noticeable to her in December 2016. She used to be more active in the past and was able to walk normally. She has no rest pain and no lower extremity ulceration. She underwent noninvasive vascular evaluation which showed severely reduced ABI on the left and moderately reduced on the right. Duplex showed no evidence of infrainguinal disease but she was noted to have monophasic waveform in the common femoral arteries bilaterally suggestive of severe inflow disease.    Past Medical History:  Diagnosis Date  . COPD (chronic obstructive pulmonary disease) ([place])   . E. coli pyelonephritis 6//2014  . Pneumonia   . Sepsis [facility])     Past Surgical History:  Procedure Laterality Date  . ABDOMINAL HYSTERECTOMY     partial  . BACK SURGERY     L4/5  . CARDIAC CATHETERIZATION N/A 01/03/2016   Procedure: Right/Left Heart Cath and Coronary Angiography;  Surgeon: Jolaine Artist, MD;  Location: [place] CV LAB;  Service:  Cardiovascular;  Laterality: N/A;  . COLONOSCOPY N/A 08/05/2015   Procedure: COLONOSCOPY;  Surgeon: Rogene Houston, MD;  [facility];  Service: Endoscopy;  Laterality: N/A;  730  . ELBOW SURGERY       Current Outpatient Prescriptions  Medication Sig Dispense Refill  . cholecalciferol (VITAMIN D) 1000 units tablet Take 1,000 Units by mouth daily.    . Multiple Vitamin (MULTIVITAMIN WITH MINERALS) TABS tablet Take 1 tablet by mouth daily.    Marland Kitchen omeprazole (PRILOSEC) 20 MG capsule Take 20 mg by mouth daily.     . sertraline (ZOLOFT) 25 MG tablet Take 25 mg by mouth daily as needed (anxiety attack).     . Tiotropium Bromide Monohydrate (SPIRIVA RESPIMAT) 2.5 MCG/ACT AERS Inhale 2 puffs into the lungs daily.     No current facility-administered medications for this visit.     Allergies:   Sulfur    Social History:  The patient  reports that she has been smoking Cigarettes.  She has a 50.00 pack-year smoking history. She has never used smokeless tobacco. She reports that she does not drink alcohol or use drugs.   Family History:  The patient's family history includes Diabetes in her mother; Emphysema in her father.    ROS:  Please see the history of present illness.   Otherwise, review of systems are positive for none.   All other systems are reviewed and negative.    PHYSICAL EXAM: VS:  BP 93/61 (BP Location: Right Arm, Patient Position: Sitting,  Cuff Size: Normal)   Ht '5\' 8"'$  (1.727 m)   Wt 140 lb (63.5 kg)   BMI 21.29 kg/m  , BMI Body mass index is 21.29 kg/m. GEN: Well nourished, well developed, in no acute distress  HEENT: normal  Neck: no JVD, carotid bruits, or masses Cardiac: RRR; no murmurs, rubs, or gallops,no edema  Respiratory:  clear to auscultation bilaterally, normal work of breathing GI: soft, nontender, nondistended, + BS MS: no deformity or atrophy  Skin: warm and dry, no rash Neuro:  Strength and sensation are intact Psych: euthymic mood, full  affect Vascular: Femoral pulses are +1 on the right and barely palpable on the left. Distal pulses are not palpable.  EKG:  EKG is not ordered today.    Recent Labs: 12/27/2015: ALT 10; BUN 23; Creatinine, Ser 0.82; Hemoglobin 15.5; Platelets 215; Potassium 4.0; Sodium 137; TSH 0.427    Lipid Panel No results found for: CHOL, TRIG, HDL, CHOLHDL, VLDL, LDLCALC, LDLDIRECT    Wt Readings from Last 3 Encounters:  02/08/16 140 lb (63.5 kg)  01/07/16 140 lb (63.5 kg)  01/03/16 139 lb (63 kg)        ASSESSMENT AND PLAN:  1.  Peripheral arterial disease with severe lifestyle limiting claudication ( Rutherford class III). The patient has been extremely limited by this. I discussed the natural history of claudication and peripheral arterial disease and discussed different management options. She is not able to do any activities without claudication given severity of her symptoms, I recommend proceeding with abdominal aortogram, lower extremity runoff and possible endovascular intervention. I discussed risks and benefits and details. Planned arterial access is via the right common femoral artery with anticipation for need of bilateral access I started aspirin 81 mg once daily.  2. Hyperlipidemia: Due to peripheral arterial disease, I recommend treatment with atorvastatin. I started atorvastatin 20 mg once daily. Repeat lipid and liver profile in 6 weeks.  3. Tobacco use: I discussed with her the strong association between this and peripheral artery disease and strongly advised her to quit smoking.   Disposition:   FU with me in 1 month  Signed,  Kathlyn Sacramento, MD  02/08/2016 9:01 AM    [place]

## 2016-02-08 NOTE — Patient Instructions (Addendum)
Medication Instructions:  Your physician has recommended you make the following change in your medication:  1. START Aspirin '81mg'$  take one tablet by mouth daily 2. START Atorvastatin '20mg'$  take one tablet by mouth every evening  Labwork: Your physician recommends that you have lab work today: BMP, CBC and PT/INR  Your physician recommends that you return for lab work in: [place] for a LIPID and LIVER (will arrange during follow-up appointment)  Testing/Procedures: Your physician has requested that you have a peripheral vascular angiogram. This exam is performed at the hospital. During this exam IV contrast is used to look at arterial blood flow. Please review the information sheet given for details.  Follow-Up: Your physician recommends that you schedule a follow-up appointment in: 3-4 WEEKS with Dr Fletcher Anon   Any Other Special Instructions Will Be Listed Below (If Applicable).     If you need a refill on your cardiac medications before your next appointment, please call your pharmacy.  Smoking Cessation, Tips for Success If you are ready to quit smoking, congratulations! You have chosen to help yourself be healthier. Cigarettes bring nicotine, tar, carbon monoxide, and other irritants into your body. Your lungs, heart, and blood vessels will be able to work better without these poisons. There are many different ways to quit smoking. Nicotine gum, nicotine patches, a nicotine inhaler, or nicotine nasal spray can help with physical craving. Hypnosis, support groups, and medicines help break the habit of smoking. WHAT THINGS CAN I DO TO MAKE QUITTING EASIER?  Here are some tips to help you quit for good:  Pick a date when you will quit smoking completely. Tell all of your friends and family about your plan to quit on that date.  Do not try to slowly cut down on the number of cigarettes you are smoking. Pick a quit date and quit smoking completely starting on that day.  Throw away all  cigarettes.   Clean and remove all ashtrays from your home, work, and car.  On a card, write down your reasons for quitting. Carry the card with you and read it when you get the urge to smoke.  Cleanse your body of nicotine. Drink enough water and fluids to keep your urine clear or pale yellow. Do this after quitting to flush the nicotine from your body.  Learn to predict your moods. Do not let a bad situation be your excuse to have a cigarette. Some situations in your life might tempt you into wanting a cigarette.  Never have "just one" cigarette. It leads to wanting another and another. Remind yourself of your decision to quit.  Change habits associated with smoking. If you smoked while driving or when feeling stressed, try other activities to replace smoking. Stand up when drinking your coffee. Brush your teeth after eating. Sit in a different chair when you read the paper. Avoid alcohol while trying to quit, and try to drink fewer caffeinated beverages. Alcohol and caffeine may urge you to smoke.  Avoid foods and drinks that can trigger a desire to smoke, such as sugary or spicy foods and alcohol.  Ask people who smoke not to smoke around you.  Have something planned to do right after eating or having a cup of coffee. For example, plan to take a walk or exercise.  Try a relaxation exercise to calm you down and decrease your stress. Remember, you may be tense and nervous for the first 2 weeks after you quit, but this will pass.  Find new  activities to keep your hands busy. Play with a pen, coin, or rubber band. Doodle or draw things on paper.  Brush your teeth right after eating. This will help cut down on the craving for the taste of tobacco after meals. You can also try mouthwash.   Use oral substitutes in place of cigarettes. Try using lemon drops, carrots, cinnamon sticks, or chewing gum. Keep them handy so they are available when you have the urge to smoke.  When you have the  urge to smoke, try deep breathing.  Designate your home as a nonsmoking area.  If you are a heavy smoker, ask your health care provider about a prescription for nicotine chewing gum. It can ease your withdrawal from nicotine.  Reward yourself. Set aside the cigarette money you save and buy yourself something nice.  Look for support from others. Join a support group or smoking cessation program. Ask someone at home or at work to help you with your plan to quit smoking.  Always ask yourself, "Do I need this cigarette or is this just a reflex?" Tell yourself, "Today, I choose not to smoke," or "I do not want to smoke." You are reminding yourself of your decision to quit.  Do not replace cigarette smoking with electronic cigarettes (commonly called e-cigarettes). The safety of e-cigarettes is unknown, and some may contain harmful chemicals.  If you relapse, do not give up! Plan ahead and think about what you will do the next time you get the urge to smoke. HOW WILL I FEEL WHEN I QUIT SMOKING? You may have symptoms of withdrawal because your body is used to nicotine (the addictive substance in cigarettes). You may crave cigarettes, be irritable, feel very hungry, cough often, get headaches, or have difficulty concentrating. The withdrawal symptoms are only temporary. They are strongest when you first quit but will go away within 10-14 days. When withdrawal symptoms occur, stay in control. Think about your reasons for quitting. Remind yourself that these are signs that your body is healing and getting used to being without cigarettes. Remember that withdrawal symptoms are easier to treat than the major diseases that smoking can cause.  Even after the withdrawal is over, expect periodic urges to smoke. However, these cravings are generally short lived and will go away whether you smoke or not. Do not smoke! WHAT RESOURCES ARE AVAILABLE TO HELP ME QUIT SMOKING? Your health care provider can direct you to  community resources or hospitals for support, which may include:  Group support.  Education.  Hypnosis.  Therapy.   This information is not intended to replace advice given to you by your health care provider. Make sure you discuss any questions you have with your health care provider.   Document Released: 03/31/2004 Document Revised: 07/24/2014 Document Reviewed: 12/19/2012 Elsevier Interactive Patient Education [employer].

## 2016-02-09 LAB — PROTIME-INR
INR: 1
Prothrombin Time: 11 s (ref 9.0–11.5)

## 2016-02-10 ENCOUNTER — Telehealth: Payer: Self-pay | Admitting: Internal Medicine

## 2016-02-10 MED ORDER — TIOTROPIUM BROMIDE MONOHYDRATE 2.5 MCG/ACT IN AERS: 2.0000 | INHALATION_SPRAY | Freq: Every day | RESPIRATORY_TRACT | 4 refills | Status: DC

## 2016-02-10 NOTE — Telephone Encounter (Signed)
Spoke with pt and she states that the Spiriva Respimat is working well. She would like rx sent to [facility]. Sample left at front desk for pt as she will have to wait on mail order to arrive. Rx sent. Nothing further needed.

## 2016-02-11 ENCOUNTER — Ambulatory Visit (HOSPITAL_COMMUNITY)
Admission: RE | Admit: 2016-02-11 | Discharge: 2016-02-11 | Disposition: A | Discharge status: Home / Self Care | Payer: Medicare Other | Source: Ambulatory Visit | Attending: Internal Medicine | Admitting: Internal Medicine

## 2016-02-11 ENCOUNTER — Encounter (HOSPITAL_COMMUNITY): Payer: Self-pay

## 2016-02-11 ENCOUNTER — Encounter (HOSPITAL_COMMUNITY): Payer: Medicare Other | Admitting: Internal Medicine

## 2016-02-11 VITALS — BP 124/76 | HR 93 | Wt 139.8 lb

## 2016-02-11 DIAGNOSIS — F17200 Nicotine dependence, unspecified, uncomplicated: Secondary | ICD-10-CM | POA: Diagnosis not present

## 2016-02-11 DIAGNOSIS — R0600 Dyspnea, unspecified: Secondary | ICD-10-CM

## 2016-02-11 DIAGNOSIS — J439 Emphysema, unspecified: Secondary | ICD-10-CM | POA: Insufficient documentation

## 2016-02-11 DIAGNOSIS — Z833 Family history of diabetes mellitus: Secondary | ICD-10-CM | POA: Diagnosis not present

## 2016-02-11 DIAGNOSIS — F17210 Nicotine dependence, cigarettes, uncomplicated: Secondary | ICD-10-CM | POA: Insufficient documentation

## 2016-02-11 DIAGNOSIS — Z882 Allergy status to sulfonamides status: Secondary | ICD-10-CM | POA: Insufficient documentation

## 2016-02-11 DIAGNOSIS — R079 Chest pain, unspecified: Secondary | ICD-10-CM | POA: Insufficient documentation

## 2016-02-11 DIAGNOSIS — Z79899 Other long term (current) drug therapy: Secondary | ICD-10-CM | POA: Insufficient documentation

## 2016-02-11 DIAGNOSIS — J849 Interstitial pulmonary disease, unspecified: Secondary | ICD-10-CM | POA: Diagnosis not present

## 2016-02-11 DIAGNOSIS — M349 Systemic sclerosis, unspecified: Secondary | ICD-10-CM | POA: Diagnosis not present

## 2016-02-11 DIAGNOSIS — Z7982 Long term (current) use of aspirin: Secondary | ICD-10-CM | POA: Diagnosis not present

## 2016-02-11 DIAGNOSIS — I739 Peripheral vascular disease, unspecified: Secondary | ICD-10-CM | POA: Insufficient documentation

## 2016-02-11 DIAGNOSIS — R0609 Other forms of dyspnea: Secondary | ICD-10-CM | POA: Diagnosis not present

## 2016-02-11 DIAGNOSIS — Z825 Family history of asthma and other chronic lower respiratory diseases: Secondary | ICD-10-CM | POA: Diagnosis not present

## 2016-02-11 NOTE — Progress Notes (Signed)
Patient ID: Kayla Sawyer, female   DOB: 29-Mar-1946, 70 y.o.   MRN: [id number]   Advanced Heart Failure Clinic Note   Referring Physician: Dr Gavin Pound Interventional Cardiologist: Dr Fletcher Anon HF MD: Dr Haroldine Laws Pulmology: Dr Geoffery Spruce  HPI: Kayla Sawyer is a 70 y.o. female with history of probable scleroderma, PAD, tobacco abuse.     She is a life-long smoker. Denies h/o known CAD. However was admitted several years ago for pyelonephritis and CT scans show diffuse coronary calcifications and mesenteric atherosclerosis. Also had carotid u/s at that time with bilateral 40-59% stenoses.   She returns for HF follow up.  Since the last visit she has been evaluated by pulmonary and interventional cardiology. Planning for arteriogram next week. Overall feeling ok. Mild dyspsnea with exertion. Ongoing pain in her calves when she walks. Smoking 1/2 pack of cigarettes per day. Taking all medications.   CT chest 07/07/11 shows heavy calcification of SMA, as well as some calcification aorta and branch vessels.  US Carotids 05/30/2013 with mild to moderate plaque formation is seen involving the proximal internal carotid arteries bilaterally consistent with 50-69%. Stenosis based on Doppler criteria. US Carotids 12/2015 -Findings suggest 1-39% internal carotid artery stenosis bilaterally  Echo 12/27/15  LVEF 60-65%, RV normal. PA pressure 27 mm/Hg  PFTs 12/09/15 FVC 2.44  (77%) FEV1 1.28 (71%) TLC 5.17 DLCO 7.59 (29%)   RHC/LHC 12/2015 RA = 1 RV = 38/0/4 PA = 40/11 (26) PCW = 7 Fick cardiac output/index = 3.5/2.0 PVR = 5.0 WU Ao sat = 90% PA sat = 58%, 58% Assessment: 1. Mild non-obstructive CAD with separate ostia for LAD and LCX 2. Minimally elevated R-sided pressures 3. Normal LV function 4. Moderately reduced cardiac output    Past Medical History:  Diagnosis Date  . COPD (chronic obstructive pulmonary disease) ([place])   . E. coli pyelonephritis 6//2014  . Pneumonia   . Sepsis  [facility])     Current Outpatient Prescriptions  Medication Sig Dispense Refill  . aspirin EC 81 MG tablet Take 1 tablet (81 mg total) by mouth daily.    Marland Kitchen atorvastatin (LIPITOR) 20 MG tablet Take 1 tablet (20 mg total) by mouth daily. 90 tablet 3  . cholecalciferol (VITAMIN D) 1000 units tablet Take 1,000 Units by mouth daily.    . Multiple Vitamin (MULTIVITAMIN WITH MINERALS) TABS tablet Take 1 tablet by mouth daily.    Marland Kitchen omeprazole (PRILOSEC) 20 MG capsule Take 20 mg by mouth daily.     . sertraline (ZOLOFT) 25 MG tablet Take 25 mg by mouth daily as needed (anxiety attack).     . Tiotropium Bromide Monohydrate (SPIRIVA RESPIMAT) 2.5 MCG/ACT AERS Inhale 2 puffs into the lungs daily. 3 Inhaler 4   No current facility-administered medications for this encounter.     Allergies  Allergen Reactions  . Sulfur Nausea Only      Social History   Social History  . Marital status: Married    Spouse name: N/A  . Number of children: N/A  . Years of education: N/A   Occupational History  . Not on file.   Social History Main Topics  . Smoking status: Current Every Day Smoker    Packs/day: 1.00    Years: 50.00    Types: Cigarettes  . Smokeless tobacco: Never Used  . Alcohol use No  . Drug use: No  . Sexual activity: No   Other Topics Concern  . Not on file   Social History Narrative  .  No narrative on file      Family History  Problem Relation Age of Onset  . Diabetes Mother   . Emphysema Father     Vitals:   02/11/16 1047  BP: 124/76  Pulse: 93  SpO2: (!) 89%  Weight: 139 lb 12.8 oz (63.4 kg)     PHYSICAL EXAM: General:  Ambulated int the clinic with a rolling walker. No respiratory difficulty HEENT: normal Neck: supple. no JVD. Carotids 2+ bilat; faint right bruit. No lymphadenopathy or thyromegaly appreciated. Cor: PMI nondisplaced. Distant heart sounds. Regular rate & rhythm. No rubs, gallops or murmurs. Lungs: Clear with diminished breath sounds  throughout Abdomen: soft, NT, ND, no HSM. No bruits or masses. +BS  Extremities: no cyanosis, clubbing, rash, edema. Telangiectasias on bilateral hands and fingers. Synovial thickening bilateral hands. 2+ DP pulses Neuro: alert & oriented x 3, cranial nerves grossly intact. moves all 4 extremities w/o difficulty. Affect pleasant.   ASSESSMENT & PLAN:   1. Scleroderma - Echo 12/27/15 LVEF 60-65%, PA pressure 27 mm/Hg RHC/LHC ok. No PAH.  ABI ok no significant stenosis. Carotid US ok. Repeat yearly  -2. Tobacco Abuse - Needs to stop. Says she is cutting back.  3. Chest pain- Had LHC 12/2105 mild non obstructive CAD. On statin and aspirin.  4. PAD--ABIs reduced on the left and moderatlely reduced on the right. Plan for arteriogram next week with Dr Fletcher Anon 5. Dyspnea- -> Does not have PAH. Emphysema- Started spiriva and pulmonary reheab. Now following with Dr Chase Caller   Follow up in 3 months with Dr Haroldine Laws  Deakin Lacek NP-C   02/11/2016

## 2016-02-11 NOTE — Patient Instructions (Signed)
Your physician recommends that you schedule a follow-up appointment in: 6 months with Dr Haroldine Laws

## 2016-02-16 ENCOUNTER — Encounter (HOSPITAL_COMMUNITY)
Admission: RE | Disposition: A | Discharge status: Home / Self Care | Payer: Self-pay | Source: Ambulatory Visit | Attending: Cardiovascular Disease

## 2016-02-16 ENCOUNTER — Encounter (HOSPITAL_COMMUNITY): Payer: Self-pay | Admitting: Cardiovascular Disease

## 2016-02-16 ENCOUNTER — Ambulatory Visit (HOSPITAL_COMMUNITY)
Admission: RE | Admit: 2016-02-16 | Discharge: 2016-02-16 | Disposition: A | Discharge status: Home / Self Care | Payer: Medicare Other | Source: Ambulatory Visit | Attending: Cardiovascular Disease | Admitting: Cardiovascular Disease

## 2016-02-16 DIAGNOSIS — F17210 Nicotine dependence, cigarettes, uncomplicated: Secondary | ICD-10-CM | POA: Diagnosis not present

## 2016-02-16 DIAGNOSIS — Z8701 Personal history of pneumonia (recurrent): Secondary | ICD-10-CM | POA: Diagnosis not present

## 2016-02-16 DIAGNOSIS — Z833 Family history of diabetes mellitus: Secondary | ICD-10-CM | POA: Insufficient documentation

## 2016-02-16 DIAGNOSIS — E785 Hyperlipidemia, unspecified: Secondary | ICD-10-CM | POA: Diagnosis not present

## 2016-02-16 DIAGNOSIS — J449 Chronic obstructive pulmonary disease, unspecified: Secondary | ICD-10-CM | POA: Insufficient documentation

## 2016-02-16 DIAGNOSIS — I70213 Atherosclerosis of native arteries of extremities with intermittent claudication, bilateral legs: Secondary | ICD-10-CM | POA: Insufficient documentation

## 2016-02-16 DIAGNOSIS — I2510 Atherosclerotic heart disease of native coronary artery without angina pectoris: Secondary | ICD-10-CM | POA: Insufficient documentation

## 2016-02-16 DIAGNOSIS — I714 Abdominal aortic aneurysm, without rupture: Secondary | ICD-10-CM | POA: Insufficient documentation

## 2016-02-16 DIAGNOSIS — I739 Peripheral vascular disease, unspecified: Secondary | ICD-10-CM

## 2016-02-16 DIAGNOSIS — I70211 Atherosclerosis of native arteries of extremities with intermittent claudication, right leg: Secondary | ICD-10-CM | POA: Diagnosis not present

## 2016-02-16 HISTORY — PX: PERIPHERAL VASCULAR CATHETERIZATION: SHX172C

## 2016-02-16 SURGERY — ABDOMINAL AORTOGRAM W/LOWER EXTREMITY
Anesthesia: LOCAL

## 2016-02-16 MED ORDER — MIDAZOLAM HCL 2 MG/2ML IJ SOLN
INTRAMUSCULAR | Status: AC
  Filled 2016-02-16: qty 2

## 2016-02-16 MED ORDER — SODIUM CHLORIDE 0.9 % IV SOLN: 250.0000 mL | INTRAVENOUS | Status: DC | PRN

## 2016-02-16 MED ORDER — ASPIRIN 81 MG PO CHEW: 81.0000 mg | CHEWABLE_TABLET | ORAL | Status: DC

## 2016-02-16 MED ORDER — SODIUM CHLORIDE 0.9% FLUSH: 3.0000 mL | Freq: Two times a day (BID) | INTRAVENOUS | Status: DC

## 2016-02-16 MED ORDER — LIDOCAINE HCL (PF) 1 % IJ SOLN
INTRAMUSCULAR | Status: DC | PRN
  Administered 2016-02-16: 20 mL via SUBCUTANEOUS

## 2016-02-16 MED ORDER — HEPARIN (PORCINE) IN NACL 2-0.9 UNIT/ML-% IJ SOLN
INTRAMUSCULAR | Status: DC | PRN
  Administered 2016-02-16: 1000 mL via INTRA_ARTERIAL

## 2016-02-16 MED ORDER — FENTANYL CITRATE (PF) 100 MCG/2ML IJ SOLN
INTRAMUSCULAR | Status: DC | PRN
  Administered 2016-02-16: 25 ug via INTRAVENOUS

## 2016-02-16 MED ORDER — SODIUM CHLORIDE 0.9 % IV SOLN
INTRAVENOUS | Status: DC
  Administered 2016-02-16: 08:00:00 via INTRAVENOUS

## 2016-02-16 MED ORDER — HEPARIN (PORCINE) IN NACL 2-0.9 UNIT/ML-% IJ SOLN
INTRAMUSCULAR | Status: AC
  Filled 2016-02-16: qty 1000

## 2016-02-16 MED ORDER — MIDAZOLAM HCL 2 MG/2ML IJ SOLN
INTRAMUSCULAR | Status: DC | PRN
  Administered 2016-02-16: 1 mg via INTRAVENOUS

## 2016-02-16 MED ORDER — SODIUM CHLORIDE 0.9 % IV SOLN: INTRAVENOUS | Status: AC

## 2016-02-16 MED ORDER — SODIUM CHLORIDE 0.9% FLUSH: 3.0000 mL | INTRAVENOUS | Status: DC | PRN

## 2016-02-16 MED ORDER — IODIXANOL 320 MG/ML IV SOLN
INTRAVENOUS | Status: DC | PRN
  Administered 2016-02-16: 136 mL via INTRAVENOUS

## 2016-02-16 MED ORDER — LIDOCAINE HCL (PF) 1 % IJ SOLN
INTRAMUSCULAR | Status: AC
  Filled 2016-02-16: qty 30

## 2016-02-16 MED ORDER — FENTANYL CITRATE (PF) 100 MCG/2ML IJ SOLN
INTRAMUSCULAR | Status: AC
  Filled 2016-02-16: qty 2

## 2016-02-16 SURGICAL SUPPLY — 11 items
CATH ANGIO 5F PIGTAIL 65CM (CATHETERS) ×2 IMPLANT
KIT MICROINTRODUCER STIFF 5F (SHEATH) ×2 IMPLANT
KIT PV (KITS) ×2 IMPLANT
SHEATH PINNACLE 5F 10CM (SHEATH) ×2 IMPLANT
STOPCOCK MORSE 400PSI 3WAY (MISCELLANEOUS) ×2 IMPLANT
SYRINGE MEDRAD AVANTA MACH 7 (SYRINGE) ×2 IMPLANT
TRANSDUCER W/STOPCOCK (MISCELLANEOUS) ×2 IMPLANT
TRAY PV CATH (CUSTOM PROCEDURE TRAY) ×2 IMPLANT
TUBING CIL FLEX 10 FLL-RA (TUBING) ×4 IMPLANT
WIRE HITORQ VERSACORE ST 145CM (WIRE) ×2 IMPLANT
WIRE TORQFLEX AUST .018X40CM (WIRE) ×2 IMPLANT

## 2016-02-16 NOTE — Discharge Instructions (Signed)
Angiogram, Care After °Refer to this sheet in the next few weeks. These instructions provide you with information about caring for yourself after your procedure. Your health care provider may also give you more specific instructions. Your treatment has been planned according to current medical practices, but problems sometimes occur. Call your health care provider if you have any problems or questions after your procedure. °WHAT TO EXPECT AFTER THE PROCEDURE °After your procedure, it is typical to have the following: °· Bruising at the catheter insertion site that usually fades within 1-2 weeks. °· Blood collecting in the tissue (hematoma) that may be painful to the touch. It should usually decrease in size and tenderness within 1-2 weeks. °HOME CARE INSTRUCTIONS °· Take medicines only as directed by your health care provider. °· You may shower 24-48 hours after the procedure or as directed by your health care provider. Remove the bandage (dressing) and gently wash the site with plain soap and water. Pat the area dry with a clean towel. Do not rub the site, because this may cause bleeding. °· Do not take baths, swim, or use a hot tub until your health care provider approves. °· Check your insertion site every day for redness, swelling, or drainage. °· Do not apply powder or lotion to the site. °· Do not lift over 10 lb (4.5 kg) for 5 days after your procedure or as directed by your health care provider. °· Ask your health care provider when it is okay to: °¨ Return to work or school. °¨ Resume usual physical activities or sports. °¨ Resume sexual activity. °· Do not drive home if you are discharged the same day as the procedure. Have someone else drive you. °· You may drive 24 hours after the procedure unless otherwise instructed by your health care provider. °· Do not operate machinery or power tools for 24 hours after the procedure or as directed by your health care provider. °· If your procedure was done as an  outpatient procedure, which means that you went home the same day as your procedure, a responsible adult should be with you for the first 24 hours after you arrive home. °· Keep all follow-up visits as directed by your health care provider. This is important. °SEEK MEDICAL CARE IF: °· You have a fever. °· You have chills. °· You have increased bleeding from the catheter insertion site. Hold pressure on the site.  CALL 911 °SEEK IMMEDIATE MEDICAL CARE IF: °· You have unusual pain at the catheter insertion site. °· You have redness, warmth, or swelling at the catheter insertion site. °· You have drainage (other than a small amount of blood on the dressing) from the catheter insertion site. °· The catheter insertion site is bleeding, and the bleeding does not stop after 30 minutes of holding steady pressure on the site. °· The area near or just beyond the catheter insertion site becomes pale, cool, tingly, or numb. °  °This information is not intended to replace advice given to you by your health care provider. Make sure you discuss any questions you have with your health care provider. °  °Document Released: 01/19/2005 Document Revised: 07/24/2014 Document Reviewed: 12/04/2012 °Elsevier Interactive Patient Education ©2016 Elsevier Inc. ° °

## 2016-02-16 NOTE — H&P (View-Only) (Signed)
Cardiology Office Note   Date:  02/08/2016   ID:  Amorette, Charrette February 24, 1946, MRN [id number]  PCP:  Wende Neighbors, MD  Cardiologist:   Kathlyn Sacramento, MD   Chief Complaint  Patient presents with  . New Evaluation    blockages in legs      History of Present Illness: Kayla Sawyer is a 70 y.o. female who Was referred by Dr. Haroldine Laws for evaluation and management of claudication and peripheral arterial disease. The patient has extensive history of tobacco use and has been smoking for at least 50 years. She also has COPD and suspected scleroderma. She recently underwent a right and left cardiac catheterization which showed mild nonobstructive coronary artery disease and no evidence of pulmonary hypertension. She was seen by pulmonary who recommended pulmonary rehabilitation. However, the patient reports inability to walk even a short distance due to severe bilateral leg claudication. She gets bilateral leg pain even walking from one room to another. This became very noticeable to her in December 2016. She used to be more active in the past and was able to walk normally. She has no rest pain and no lower extremity ulceration. She underwent noninvasive vascular evaluation which showed severely reduced ABI on the left and moderately reduced on the right. Duplex showed no evidence of infrainguinal disease but she was noted to have monophasic waveform in the common femoral arteries bilaterally suggestive of severe inflow disease.    Past Medical History:  Diagnosis Date  . COPD (chronic obstructive pulmonary disease) ([place])   . E. coli pyelonephritis 6//2014  . Pneumonia   . Sepsis [name])     Past Surgical History:  Procedure Laterality Date  . ABDOMINAL HYSTERECTOMY     partial  . BACK SURGERY     L4/5  . CARDIAC CATHETERIZATION N/A 01/03/2016   Procedure: Right/Left Heart Cath and Coronary Angiography;  Surgeon: Jolaine Artist, MD;  Location: [facility];  Service:  Cardiovascular;  Laterality: N/A;  . COLONOSCOPY N/A 08/05/2015   Procedure: COLONOSCOPY;  Surgeon: Rogene Houston, MD;  Location: [facility];  Service: Endoscopy;  Laterality: N/A;  730  . ELBOW SURGERY       Current Outpatient Prescriptions  Medication Sig Dispense Refill  . cholecalciferol (VITAMIN D) 1000 units tablet Take 1,000 Units by mouth daily.    . Multiple Vitamin (MULTIVITAMIN WITH MINERALS) TABS tablet Take 1 tablet by mouth daily.    Marland Kitchen omeprazole (PRILOSEC) 20 MG capsule Take 20 mg by mouth daily.     . sertraline (ZOLOFT) 25 MG tablet Take 25 mg by mouth daily as needed (anxiety attack).     . Tiotropium Bromide Monohydrate (SPIRIVA RESPIMAT) 2.5 MCG/ACT AERS Inhale 2 puffs into the lungs daily.     No current facility-administered medications for this visit.     Allergies:   Sulfur    Social History:  The patient  reports that she has been smoking Cigarettes.  She has a 50.00 pack-year smoking history. She has never used smokeless tobacco. She reports that she does not drink alcohol or use drugs.   Family History:  The patient's family history includes Diabetes in her mother; Emphysema in her father.    ROS:  Please see the history of present illness.   Otherwise, review of systems are positive for none.   All other systems are reviewed and negative.    PHYSICAL EXAM: VS:  BP 93/61 (BP Location: Right Arm, Patient Position: Sitting,  Cuff Size: Normal)   Ht '5\' 8"'$  (1.727 m)   Wt 140 lb (63.5 kg)   BMI 21.29 kg/m  , BMI Body mass index is 21.29 kg/m. GEN: Well nourished, well developed, in no acute distress  HEENT: normal  Neck: no JVD, carotid bruits, or masses Cardiac: RRR; no murmurs, rubs, or gallops,no edema  Respiratory:  clear to auscultation bilaterally, normal work of breathing GI: soft, nontender, nondistended, + BS MS: no deformity or atrophy  Skin: warm and dry, no rash Neuro:  Strength and sensation are intact Psych: euthymic mood, full  affect Vascular: Femoral pulses are +1 on the right and barely palpable on the left. Distal pulses are not palpable.  EKG:  EKG is not ordered today.    Recent Labs: 12/27/2015: ALT 10; BUN 23; Creatinine, Ser 0.82; Hemoglobin 15.5; Platelets 215; Potassium 4.0; Sodium 137; TSH 0.427    Lipid Panel No results found for: CHOL, TRIG, HDL, CHOLHDL, VLDL, LDLCALC, LDLDIRECT    Wt Readings from Last 3 Encounters:  02/08/16 140 lb (63.5 kg)  01/07/16 140 lb (63.5 kg)  01/03/16 139 lb (63 kg)        ASSESSMENT AND PLAN:  1.  Peripheral arterial disease with severe lifestyle limiting claudication ( Rutherford class III). The patient has been extremely limited by this. I discussed the natural history of claudication and peripheral arterial disease and discussed different management options. She is not able to do any activities without claudication given severity of her symptoms, I recommend proceeding with abdominal aortogram, lower extremity runoff and possible endovascular intervention. I discussed risks and benefits and details. Planned arterial access is via the right common femoral artery with anticipation for need of bilateral access I started aspirin 81 mg once daily.  2. Hyperlipidemia: Due to peripheral arterial disease, I recommend treatment with atorvastatin. I started atorvastatin 20 mg once daily. Repeat lipid and liver profile in 6 weeks.  3. Tobacco use: I discussed with her the strong association between this and peripheral artery disease and strongly advised her to quit smoking.   Disposition:   FU with me in 1 month  Signed,  Kathlyn Sacramento, MD  02/08/2016 9:01 AM    [place]

## 2016-02-16 NOTE — Progress Notes (Addendum)
Site area: RFA Site Prior to Removal:  Level 0 Pressure Applied For:30 min Manual: yes   Patient Status During Pull:  stable Post Pull Site:  Level 0 Post Pull Instructions Given:yes   Post Pull Pulses Present: palpable Dressing Applied: tegaderm  Bedrest begins @ 1010 till 1410 Comments:

## 2016-02-16 NOTE — Interval H&P Note (Signed)
History and Physical Interval Note:  02/16/2016 8:36 AM  Kayla Sawyer  has presented today for surgery, with the diagnosis of pvd  The various methods of treatment have been discussed with the patient and family. After consideration of risks, benefits and other options for treatment, the patient has consented to  Procedure(s): Abdominal Aortogram w/Lower Extremity (N/A) as a surgical intervention .  The patient's history has been reviewed, patient examined, no change in status, stable for surgery.  I have reviewed the patient's chart and labs.  Questions were answered to the patient's satisfaction.     Kathlyn Sacramento

## 2016-02-23 ENCOUNTER — Encounter (HOSPITAL_COMMUNITY): Payer: Medicare Other

## 2016-03-01 ENCOUNTER — Telehealth: Payer: Self-pay | Admitting: Cardiovascular Disease

## 2016-03-01 DIAGNOSIS — I739 Peripheral vascular disease, unspecified: Secondary | ICD-10-CM

## 2016-03-01 NOTE — Telephone Encounter (Signed)
Returned call to patient.She stated she had a lower ext angiogram 2 weeks ago by Dr.Arida.He was going to speak to another Dr.about having bypass and she has not heard.Advised I will send message to [place].

## 2016-03-01 NOTE — Telephone Encounter (Signed)
New message     Pts husbands wants to know if a surgeon has been contacted bypass surgery of her arteries. Please call.

## 2016-03-06 NOTE — Telephone Encounter (Signed)
Please let the patient know that I reviewed the angiogram with one of my partner and we recommend doing CT scan to determine if the artery on the left is blocked all the way to the joint or not. If it is not blocked all the way to the joint, there will be a good chance of being able to treat with stents and save her a big surgery.  Schedule her for a CT angio of abdominal aorta with lower extremity angiography to be done before her follow up visit with me.

## 2016-03-07 NOTE — Telephone Encounter (Signed)
I spoke with the pt and made her aware of Dr Tyrell Antonio recommendation in regards to proceeding with CT angio.  I have rescheduled her follow-up appointment for 03/14/16 with Dr Fletcher Anon so that we can discuss CT results. Order placed for CT angio and I will have a scheduler contact the pt.

## 2016-03-07 NOTE — Telephone Encounter (Signed)
Follow Up:    Pt says she still have not heard anything else about her surgery.She is going to cx her appointment next week until she hears something.

## 2016-03-08 ENCOUNTER — Ambulatory Visit (INDEPENDENT_AMBULATORY_CARE_PROVIDER_SITE_OTHER)
Admission: RE | Admit: 2016-03-08 | Discharge: 2016-03-08 | Disposition: A | Discharge status: Home / Self Care | Payer: Medicare Other | Source: Ambulatory Visit | Attending: Cardiovascular Disease | Admitting: Cardiovascular Disease

## 2016-03-08 DIAGNOSIS — I745 Embolism and thrombosis of iliac artery: Secondary | ICD-10-CM | POA: Diagnosis not present

## 2016-03-08 DIAGNOSIS — I739 Peripheral vascular disease, unspecified: Secondary | ICD-10-CM

## 2016-03-08 MED ORDER — IOPAMIDOL (ISOVUE-300) INJECTION 61%
125.0000 mL | Freq: Once | INTRAVENOUS | Status: AC | PRN
  Administered 2016-03-08: 125 mL via INTRAVENOUS

## 2016-03-14 ENCOUNTER — Ambulatory Visit (INDEPENDENT_AMBULATORY_CARE_PROVIDER_SITE_OTHER): Payer: Medicare Other | Admitting: Cardiovascular Disease

## 2016-03-14 ENCOUNTER — Ambulatory Visit: Payer: Medicare Other | Admitting: Cardiovascular Disease

## 2016-03-14 VITALS — Ht 68.0 in | Wt 141.4 lb

## 2016-03-14 DIAGNOSIS — E785 Hyperlipidemia, unspecified: Secondary | ICD-10-CM | POA: Diagnosis not present

## 2016-03-14 DIAGNOSIS — I739 Peripheral vascular disease, unspecified: Secondary | ICD-10-CM

## 2016-03-14 DIAGNOSIS — F17200 Nicotine dependence, unspecified, uncomplicated: Secondary | ICD-10-CM

## 2016-03-14 MED ORDER — VARENICLINE TARTRATE 1 MG PO TABS: 1.0000 mg | ORAL_TABLET | Freq: Two times a day (BID) | ORAL | 1 refills | Status: DC

## 2016-03-14 MED ORDER — VARENICLINE TARTRATE 0.5 MG X 11 & 1 MG X 42 PO MISC
ORAL | 0 refills | Status: DC
Start: 2016-03-14 — End: 2016-03-30

## 2016-03-14 NOTE — Patient Instructions (Signed)
Medication Instructions:  Your physician recommends that you continue on your current medications as directed. Please refer to the Current Medication list given to you today.  Start Chantix one month starter pack as directed and then take maintenance dosage for 2 months.   Labwork: No new orders.   Testing/Procedures: No new orders.   Follow-Up: You have been referred to Dr Trula Slade with [facility] for Aorto-Bifem Bypass evaluation.  Your physician recommends that you schedule a follow-up appointment in: 3 MONTHS with Dr Fletcher Anon     Any Other Special Instructions Will Be Listed Below (If Applicable).     If you need a refill on your cardiac medications before your next appointment, please call your pharmacy.

## 2016-03-14 NOTE — Progress Notes (Signed)
Cardiology Office Note   Date:  03/14/2016   ID:  Joani, Cosma [birth year], MRN [id number]  PCP:  Wende Neighbors, MD  Cardiologist:  Dr. Haroldine Laws  No chief complaint on file.     History of Present Illness: KEIGAN GIRTEN is a 70 y.o. female who is here today for a follow-up visit regarding severe claudication and peripheral arterial disease.  The patient has extensive history of tobacco use and has been smoking for at least 50 years. She also has COPD and suspected scleroderma. She  underwent a right and left cardiac catheterization in June which showed mild nonobstructive coronary artery disease and no evidence of pulmonary hypertension. She was seen by pulmonary who recommended pulmonary rehabilitation. However, the patient could not participate due to severe left leg claudication. She underwent noninvasive vascular evaluation which showed severely reduced ABI on the left and moderately reduced on the right. Duplex showed no evidence of infrainguinal disease but she was noted to have monophasic waveform in the common femoral arteries bilaterally suggestive of severe inflow disease. I proceeded with angiography which showed small abdominal aortic aneurysm with moderate calcified stenosis, severe calcified disease affecting the right common iliac artery and moderate calcified disease affecting the right common femoral artery with no infrainguinal disease. On the left, there was flush occlusion of the left common iliac artery with reconstitution via collaterals in the proximal left common femoral artery no infrainguinal disease. In order to determine the reconstitution area, I did do CTA of the aorta which showed eccentric intraluminal thrombus in the infrarenal aorta causing borderline stenosis. She continues to complain of severe bilateral leg pain with minimal walking. According to her family, most of the time now she stays in bed or sitting in the chair to avoid walking. She has no lower  extremity ulceration. She wants to quit smoking.    Past Medical History:  Diagnosis Date  . COPD (chronic obstructive pulmonary disease) ([place])   . E. coli pyelonephritis 6//2014  . Pneumonia   . Sepsis [facility])     Past Surgical History:  Procedure Laterality Date  . ABDOMINAL HYSTERECTOMY     partial  . BACK SURGERY     L4/5  . CARDIAC CATHETERIZATION N/A 01/03/2016   Procedure: Right/Left Heart Cath and Coronary Angiography;  Surgeon: Jolaine Artist, MD;  Location: [place] [facility];  Service: Cardiovascular;  Laterality: N/A;  . COLONOSCOPY N/A 08/05/2015   Procedure: COLONOSCOPY;  Surgeon: Rogene Houston, MD;  [facility];  Service: Endoscopy;  Laterality: N/A;  730  . ELBOW SURGERY    . PERIPHERAL VASCULAR CATHETERIZATION N/A 02/16/2016   Procedure: Abdominal Aortogram w/Lower Extremity;  Surgeon: Wellington Hampshire, MD;  Location: [place] [facility];  Service: Cardiovascular;  Laterality: N/A;     Current Outpatient Prescriptions  Medication Sig Dispense Refill  . acetaminophen (TYLENOL) 500 MG tablet Take 500 mg by mouth every 6 (six) hours as needed for mild pain.    Marland Kitchen aspirin EC 81 MG tablet Take 1 tablet (81 mg total) by mouth daily.    Marland Kitchen atorvastatin (LIPITOR) 20 MG tablet Take 1 tablet (20 mg total) by mouth daily. 90 tablet 3  . cholecalciferol (VITAMIN D) 1000 units tablet Take 1,000 Units by mouth daily.    . Multiple Vitamin (MULTIVITAMIN WITH MINERALS) TABS tablet Take 1 tablet by mouth daily.    Marland Kitchen omeprazole (PRILOSEC) 20 MG capsule Take 20 mg by mouth daily.     Marland Kitchen  sertraline (ZOLOFT) 25 MG tablet Take 25 mg by mouth daily.     . Tiotropium Bromide Monohydrate (SPIRIVA RESPIMAT) 2.5 MCG/ACT AERS Inhale 2 puffs into the lungs daily. 3 Inhaler 4   No current facility-administered medications for this visit.     Allergies:   Sulfur    Social History:  The patient  reports that she has been smoking Cigarettes.  She has a 50.00 pack-year smoking  history. She has never used smokeless tobacco. She reports that she does not drink alcohol or use drugs.   Family History:  The patient's family history includes Diabetes in her mother; Emphysema in her father.    ROS:  Please see the history of present illness.   Otherwise, review of systems are positive for none.   All other systems are reviewed and negative.    PHYSICAL EXAM: VS:  BP (P) 123/74   Pulse (P) 92   Ht '5\' 8"'$  (1.727 m)   Wt 141 lb 6.4 oz (64.1 kg)   BMI 21.50 kg/m  , BMI Body mass index is 21.5 kg/m. GEN: Well nourished, well developed, in no acute distress  HEENT: normal  Neck: no JVD, carotid bruits, or masses Cardiac: RRR; no murmurs, rubs, or gallops,no edema  Respiratory:  clear to auscultation bilaterally, normal work of breathing GI: soft, nontender, nondistended, + BS MS: no deformity or atrophy  Skin: warm and dry, no rash Neuro:  Strength and sensation are intact Psych: euthymic mood, full affect Vascular: Femoral pulses are +1 on the right and barely palpable on the left. Distal pulses are not palpable.  EKG:  EKG is not ordered today.    Recent Labs: 12/27/2015: ALT 10; TSH 0.427 02/08/2016: BUN 26; Creat 0.90; Hemoglobin 14.9; Platelets 254; Potassium 4.3; Sodium 141    Lipid Panel No results found for: CHOL, TRIG, HDL, CHOLHDL, VLDL, LDLCALC, LDLDIRECT    Wt Readings from Last 3 Encounters:  03/14/16 141 lb 6.4 oz (64.1 kg)  02/16/16 139 lb 5 oz (63.2 kg)  02/11/16 139 lb 12.8 oz (63.4 kg)        ASSESSMENT AND PLAN:  1.  Peripheral arterial disease with severe lifestyle limiting claudication ( [place]utherford class III).  The patient has borderline significant infra-renal aortic stenosis with long occlusion of the left common and external iliac arteries, severe disease affecting the right common iliac artery and borderline significant disease affecting the right common femoral artery. I think her best option is aortobifemoral bypass. Cardiac  catheterization in June showed no obstructive coronary artery disease and no evidence of pulmonary hypertension. Ejection fraction was normal. Thus, she is at low risk from a cardiac standpoint. I think the biggest issue going to be her lung disease and we have to get clearance from Dr. Chase Caller. She has an appointment with him in 2 weeks. I referred the patient to Dr. Trula Slade as I discussed the case briefly with him.  2. Hyperlipidemia: Continue treatment with atorvastatin.   3. Tobacco use: I again discussed with her the importance of smoking cessation and I told her ideally she should quit before the anticipated surgery. I prescribed Chantix.   Disposition:   FU with me in 3 months  Signed,  Kathlyn Sacramento, MD  03/14/2016 9:03 AM    [place]

## 2016-03-22 ENCOUNTER — Other Ambulatory Visit: Payer: Self-pay | Admitting: Surgery

## 2016-03-22 DIAGNOSIS — I739 Peripheral vascular disease, unspecified: Secondary | ICD-10-CM

## 2016-03-30 ENCOUNTER — Encounter: Payer: Self-pay | Admitting: Internal Medicine

## 2016-03-30 ENCOUNTER — Ambulatory Visit (INDEPENDENT_AMBULATORY_CARE_PROVIDER_SITE_OTHER): Payer: Medicare Other | Admitting: Internal Medicine

## 2016-03-30 VITALS — BP 102/60 | HR 59 | Ht 68.0 in | Wt 141.6 lb

## 2016-03-30 DIAGNOSIS — J432 Centrilobular emphysema: Secondary | ICD-10-CM

## 2016-03-30 DIAGNOSIS — J849 Interstitial pulmonary disease, unspecified: Secondary | ICD-10-CM

## 2016-03-30 DIAGNOSIS — R911 Solitary pulmonary nodule: Secondary | ICD-10-CM | POA: Diagnosis not present

## 2016-03-30 DIAGNOSIS — Z23 Encounter for immunization: Secondary | ICD-10-CM

## 2016-03-30 DIAGNOSIS — F17210 Nicotine dependence, cigarettes, uncomplicated: Secondary | ICD-10-CM | POA: Diagnosis not present

## 2016-03-30 DIAGNOSIS — R918 Other nonspecific abnormal finding of lung field: Secondary | ICD-10-CM | POA: Insufficient documentation

## 2016-03-30 MED ORDER — TIOTROPIUM BROMIDE MONOHYDRATE 2.5 MCG/ACT IN AERS: 2.0000 | INHALATION_SPRAY | Freq: Every day | RESPIRATORY_TRACT | 4 refills | Status: DC

## 2016-03-30 NOTE — Patient Instructions (Addendum)
Centrilobular emphysema ([place]) - glad spiriva helping you; continue this  - will not add another inhaler because greatest problem is claudication leg - please address claudication with vascular surgeon  - defer rehab till claudication sorted out - high dose flu shot 03/30/2016 - use albuterol as needed   ILD (interstitial lung disease) ([place]) - repeat HRCT Jan 2018  Nodule of right lung 14m RUL with history of smoking - June 2017  - repeat CT chest Jan 2018   Smoking  - try to quit smoking asap   Followup - Jan 2018 after CT chest

## 2016-03-30 NOTE — Addendum Note (Signed)
Addended by: Parke Poisson E on: 03/30/2016 10:59 AM   Modules accepted: Orders

## 2016-03-30 NOTE — Progress Notes (Signed)
Subjective:     Patient ID: Kayla Sawyer, female   DOB: 06/22/1946, 70 y.o.   MRN: [id number]  HPI   PCP Wende Neighbors, MD   HPI   IOV 01/07/2016  Chief Complaint  Patient presents with  . Advice Only    Referred by Dr. Haroldine Laws for nsip.      70 year old female presents with her daughter [name] was discharged as of the emergency department at [facility]. At baseline she uses a walker for doing groceries or walking in the mall. She has chronic stable dyspnea for many years. She has a previous history of emphysema for which she took Advair but does not take it for many years. The dyspnea is only moderate.. But she also has chronic cough for the last several months it is associated. Cough is mild. More recently she has had fatigue and progression of dyspnea associated with skin lesions. She apparently saw Dr. Gavin Pound in rheumatology. Scleroderma was entertained but autoimmune panel has been negative. She subsequently saw Dr. Haroldine Laws. Echo suggested possible elevation in pulmonary artery pressure and therefore she underwent a right heart cath which is documented below. Mean pulmonary artery pressure is only 26 mmHg just at the cusp of normal/abnormal. She had CT scan of the chest 12/30/2015 that shows a 6 mm spiculated right upper lobe nodule and a patulous esophagus suggesting scleroderma. Therefore the diagnosis of scleroderma is being entertained again. In addition she has possible UIP pattern and emphysema on CT chest. She admits to significant physical deconditioning.      Pulmonary function test 12/09/2015 FVC 2.4 L/77%, ratio of 68 suggesting obstruction FEV1 1.06 L/44%. Total lung capacity 5.17/99%, DLCO 7.6 L/29%  High-resolution CT chest 12/30/2015 shows diffuse emphysema bilaterally associated with non-UIP pattern but possible UIP pattern ILD the bases. Looking back this might of started in 2012 CT chest   Cardiac catheterization results 01/03/2016 reviewed mean  pulmonary artery pressure 26 mmHg and she has nonobstructive coronary artery disease  Walking desaturation test on 01/07/2016 185 feet x 3 laps:  did NOT desaturate. Rest pulse ox was 94, final pulse ox was 92%. HR response 81/min at rest to 116 /min at peak exertion. = - Walk only one lap and stopped due to significant fatigue and back significantly tachycardic.    has a past medical history of Sepsis ([place]); Pneumonia; COPD (chronic obstructive pulmonary disease) ([place]); and E. coli pyelonephritis (6//2014).   reports that she has been smoking Cigarettes.  She has a 50 pack-year smoking history.    OV 03/30/2016  Chief Complaint  Patient presents with  . Follow-up    Pt states the Spiriva has slightly help her breathing and mucus production. Pt denies CP/tightness, wheezing, and f/c/s. Pt states she is unable to do rehab because of vessel occlutions in her leg, pt is seeing a vascular surgeon on 10/9   Follow-up mixed emphysema with ILD no UIP pattern: She is on Spiriva. This helps with her cough. This not much improvement in her dyspnea but this is because the primary problem is claudication. She has claudication that is long-standing and chronic when she walks from one room to the other in her house. She is due to see the vascular surgeon. Because of the claudication she is unable to pulmonary rehabilitation. She still is significant physical deconditioning. At this point she opts to continue Spiriva. We agreed there is no indication for add-on therapy because dyspnea is not the main issue. In terms of possible  autoimmune disease she is due to see Dr. Trudie Reed in November 2017. We will get the notes at that time. She will have high dose flu shot today  smoking: She continues to smoke struggles to quit  6 mm right upper lobe lung nodule: This was in June 2017. 6 month CT is required.     has a past medical history of COPD (chronic obstructive pulmonary disease) ([place]); E. coli pyelonephritis  (6//2014); Pneumonia; and Sepsis ([place]).   reports that she has been smoking Cigarettes.  She has a 50.00 pack-year smoking history. She has never used smokeless tobacco.  Past Surgical History:  Procedure Laterality Date  . ABDOMINAL HYSTERECTOMY     partial  . BACK SURGERY     L4/5  . CARDIAC CATHETERIZATION N/A 01/03/2016   Procedure: Right/Left Heart Cath and Coronary Angiography;  Surgeon: Jolaine Artist, MD;  Location: [place] [facility];  Service: Cardiovascular;  Laterality: N/A;  . COLONOSCOPY N/A 08/05/2015   Procedure: COLONOSCOPY;  Surgeon: Rogene Houston, MD;  [facility];  Service: Endoscopy;  Laterality: N/A;  730  . ELBOW SURGERY    . PERIPHERAL VASCULAR CATHETERIZATION N/A 02/16/2016   Procedure: Abdominal Aortogram w/Lower Extremity;  Surgeon: Wellington Hampshire, MD;  Location: [place] [facility];  Service: Cardiovascular;  Laterality: N/A;    Allergies  Allergen Reactions  . Sulfur Nausea Only    Immunization History  Administered Date(s) Administered  . Influenza Split 07/08/2011, 07/09/2015  . Pneumococcal Conjugate-13 07/09/2015  . Pneumococcal Polysaccharide-23 07/08/2011    Family History  Problem Relation Age of Onset  . Diabetes Mother   . Emphysema Father      Current Outpatient Prescriptions:  .  acetaminophen (TYLENOL) 500 MG tablet, Take 500 mg by mouth every 6 (six) hours as needed for mild pain., Disp: , Rfl:  .  aspirin EC 81 MG tablet, Take 1 tablet (81 mg total) by mouth daily., Disp: , Rfl:  .  atorvastatin (LIPITOR) 20 MG tablet, Take 1 tablet (20 mg total) by mouth daily., Disp: 90 tablet, Rfl: 3 .  cholecalciferol (VITAMIN D) 1000 units tablet, Take 1,000 Units by mouth daily., Disp: , Rfl:  .  Multiple Vitamin (MULTIVITAMIN WITH MINERALS) TABS tablet, Take 1 tablet by mouth daily., Disp: , Rfl:  .  omeprazole (PRILOSEC) 20 MG capsule, Take 20 mg by mouth daily. , Disp: , Rfl:  .  sertraline (ZOLOFT) 25 MG tablet, Take 25  mg by mouth daily. , Disp: , Rfl:  .  Tiotropium Bromide Monohydrate (SPIRIVA RESPIMAT) 2.5 MCG/ACT AERS, Inhale 2 puffs into the lungs daily., Disp: 3 Inhaler, Rfl: 4 .  varenicline (CHANTIX CONTINUING MONTH PAK) 1 MG tablet, Take 1 tablet (1 mg total) by mouth 2 (two) times daily., Disp: 60 tablet, Rfl: 1    Review of Systems     Objective:   Physical Exam  Constitutional: She is oriented to person, place, and time. She appears well-developed and well-nourished. No distress.  HENT:  Head: Normocephalic and atraumatic.  Right Ear: External ear normal.  Left Ear: External ear normal.  Mouth/Throat: Oropharynx is clear and moist. No oropharyngeal exudate.  Eyes: Conjunctivae and EOM are normal. Pupils are equal, round, and reactive to light. Right eye exhibits no discharge. Left eye exhibits no discharge. No scleral icterus.  Neck: Normal range of motion. Neck supple. No JVD present. No tracheal deviation present. No thyromegaly present.  Cardiovascular: Normal rate, regular rhythm, normal heart sounds and  intact distal pulses.  Exam reveals no gallop and no friction rub.   No murmur heard. Pulmonary/Chest: Effort normal and breath sounds normal. No respiratory distress. She has no wheezes. She has no rales. She exhibits no tenderness.  Mild basal crackles+  Abdominal: Soft. Bowel sounds are normal. She exhibits no distension and no mass. There is no tenderness. There is no rebound and no guarding.  Musculoskeletal: Normal range of motion. She exhibits no edema or tenderness.  Lymphadenopathy:    She has no cervical adenopathy.  Neurological: She is alert and oriented to person, place, and time. She has normal reflexes. No cranial nerve deficit. She exhibits normal muscle tone. Coordination normal.  Skin: Skin is warm and dry. No rash noted. She is not diaphoretic. No erythema. No pallor.  Psychiatric: She has a normal mood and affect. Her behavior is normal. Judgment and thought content  normal.  Vitals reviewed.   Vitals:   03/30/16 0934  BP: 102/60  Pulse: (!) 59  SpO2: 95%  Weight: 141 lb 9.6 oz (64.2 kg)  Height: '5\' 8"'$  (1.727 m)        Assessment:       ICD-9-CM ICD-10-CM   1. Centrilobular emphysema ([facility]) 492.8 J43.2   2. ILD (interstitial lung disease) ([place]) 515 J84.9   3. Nodule of right lung 793.11 R91.1   4. Smoking greater than 40 pack years 305.1 F17.210        Plan:     Centrilobular emphysema ([place]) - glad spiriva helping you; continue this  - will not add another inhaler because greatest problem is claudication leg - please address claudication with vascular surgeon  - defer rehab till claudication sorted out - high dose flu shot 03/30/2016 - use albuterol as needed   ILD (interstitial lung disease) ([place]) - repeat HRCT Jan 2018  Nodule of right lung 33m RUL with history of smoking - June 2017  - repeat CT chest Jan 2018   Smoking  - try to quit smoking asap   Followup - Jan 2018 after CT chest  Dr. MBrand Males M.D., FNew Orleans East HospitalC.P Pulmonary and Critical Care Medicine Staff Physician CBelgreenPulmonary and Critical Care Pager: [phone] If no answer or between  15:00h - 7:00h: call [phone]  03/30/2016 9:56 AM

## 2016-04-13 ENCOUNTER — Encounter (HOSPITAL_COMMUNITY): Payer: Medicare Other

## 2016-04-17 ENCOUNTER — Encounter: Payer: Medicare Other | Admitting: Surgery

## 2016-04-17 DIAGNOSIS — E782 Mixed hyperlipidemia: Secondary | ICD-10-CM | POA: Diagnosis not present

## 2016-04-17 DIAGNOSIS — R7301 Impaired fasting glucose: Secondary | ICD-10-CM | POA: Diagnosis not present

## 2016-04-19 DIAGNOSIS — Z0000 Encounter for general adult medical examination without abnormal findings: Secondary | ICD-10-CM | POA: Diagnosis not present

## 2016-04-19 DIAGNOSIS — R7301 Impaired fasting glucose: Secondary | ICD-10-CM | POA: Diagnosis not present

## 2016-04-19 DIAGNOSIS — Z23 Encounter for immunization: Secondary | ICD-10-CM | POA: Diagnosis not present

## 2016-04-19 DIAGNOSIS — E782 Mixed hyperlipidemia: Secondary | ICD-10-CM | POA: Diagnosis not present

## 2016-04-19 DIAGNOSIS — K219 Gastro-esophageal reflux disease without esophagitis: Secondary | ICD-10-CM | POA: Diagnosis not present

## 2016-04-24 ENCOUNTER — Encounter (HOSPITAL_COMMUNITY): Payer: Medicare Other

## 2016-04-24 ENCOUNTER — Encounter: Payer: Medicare Other | Admitting: Surgery

## 2016-05-01 DIAGNOSIS — R2681 Unsteadiness on feet: Secondary | ICD-10-CM | POA: Diagnosis not present

## 2016-05-01 DIAGNOSIS — I739 Peripheral vascular disease, unspecified: Secondary | ICD-10-CM | POA: Diagnosis not present

## 2016-05-01 DIAGNOSIS — R296 Repeated falls: Secondary | ICD-10-CM | POA: Diagnosis not present

## 2016-05-04 ENCOUNTER — Encounter: Payer: Self-pay | Admitting: Surgery

## 2016-05-08 ENCOUNTER — Ambulatory Visit (INDEPENDENT_AMBULATORY_CARE_PROVIDER_SITE_OTHER): Payer: Medicare Other | Admitting: Surgery

## 2016-05-08 ENCOUNTER — Ambulatory Visit (HOSPITAL_COMMUNITY)
Admission: RE | Admit: 2016-05-08 | Discharge: 2016-05-08 | Disposition: A | Discharge status: Home / Self Care | Payer: Medicare Other | Source: Ambulatory Visit | Attending: Surgery | Admitting: Surgery

## 2016-05-08 VITALS — BP 114/70 | HR 94 | Temp 97.2°F | Resp 16 | Ht 68.0 in | Wt 138.0 lb

## 2016-05-08 DIAGNOSIS — I739 Peripheral vascular disease, unspecified: Secondary | ICD-10-CM | POA: Diagnosis not present

## 2016-05-08 DIAGNOSIS — I700 Atherosclerosis of aorta: Secondary | ICD-10-CM | POA: Insufficient documentation

## 2016-05-08 DIAGNOSIS — I70213 Atherosclerosis of native arteries of extremities with intermittent claudication, bilateral legs: Secondary | ICD-10-CM | POA: Diagnosis not present

## 2016-05-08 DIAGNOSIS — I708 Atherosclerosis of other arteries: Secondary | ICD-10-CM | POA: Insufficient documentation

## 2016-05-08 NOTE — Progress Notes (Signed)
[facility] [place]  Patient name: Kayla Sawyer MRN: [id number] DOB: 01-Apr-1946 Sex: female  REFERRING PHYSICIAN: Dr Fletcher Anon  REASON FOR CONSULT: claudication  HPI: TAE ROBAK is a 70 y.o. female, who is referred today for evaluation of bilateral claudication.  She states that she has significant issues with ambulation.  She can hardly get around her house.  She denies having nonhealing wounds.  She has recently undergone catheterization which shows left iliac occlusion and significant calcific stenosis within her infrarenal aorta.  This is also confirmed on CT scan.  She is referred for aortobifemoral bypass graft.  The patient has a 50+-pack-year history of smoking and has COPD.  She is followed by pulmonary and is also suspected to have scleroderma.  She recently had cardiac catheterization which showed mild obstructive disease and no pulmonary hypertension.  She suffers from hypercholesterolemia which is managed with a statin.  She is on a baby aspirin for antiplatelet therapy.  Past Medical History:  Diagnosis Date  . COPD (chronic obstructive pulmonary disease) ([place])   . E. coli pyelonephritis 6//2014  . Pneumonia   . Sepsis ([place])     Family History  Problem Relation Age of Onset  . Diabetes Mother   . Emphysema Father     SOCIAL HISTORY: Social History   Social History  . Marital status: Married    Spouse name: N/A  . Number of children: N/A  . Years of education: N/A   Occupational History  . Not on file.   Social History Main Topics  . Smoking status: Current Every Day Smoker    Packs/day: 1.00    Years: 50.00    Types: Cigarettes  . Smokeless tobacco: Never Used  . Alcohol use No  . Drug use: No  . Sexual activity: No   Other Topics Concern  . Not on file   Social History Narrative  . No narrative on file    Allergies  Allergen Reactions  . Sulfur Nausea Only    Current Outpatient  Prescriptions  Medication Sig Dispense Refill  . acetaminophen (TYLENOL) 500 MG tablet Take 500 mg by mouth every 6 (six) hours as needed for mild pain.    Marland Kitchen aspirin EC 81 MG tablet Take 1 tablet (81 mg total) by mouth daily.    Marland Kitchen atorvastatin (LIPITOR) 20 MG tablet Take 1 tablet (20 mg total) by mouth daily. 90 tablet 3  . cholecalciferol (VITAMIN D) 1000 units tablet Take 1,000 Units by mouth daily.    . Multiple Vitamin (MULTIVITAMIN WITH MINERALS) TABS tablet Take 1 tablet by mouth daily.    Marland Kitchen omeprazole (PRILOSEC) 20 MG capsule Take 20 mg by mouth daily.     . sertraline (ZOLOFT) 25 MG tablet Take 25 mg by mouth daily.     . Tiotropium Bromide Monohydrate (SPIRIVA RESPIMAT) 2.5 MCG/ACT AERS Inhale 2 puffs into the lungs daily. 3 Inhaler 4   No current facility-administered medications for this visit.     REVIEW OF SYSTEMS:  '[X]'$  denotes positive finding, '[ ]'$  denotes negative finding Cardiac  Comments:  Chest pain or chest pressure:    Shortness of breath upon exertion:    Short of breath when lying flat:    Irregular heart rhythm:        Vascular    Pain in calf, thigh, or hip brought on by ambulation: x   Pain in feet at night that wakes you up from your sleep:  Blood clot in your veins:    Leg swelling:         Pulmonary    Oxygen at home:    Productive cough:     Wheezing:         Neurologic    Sudden weakness in arms or legs:     Sudden numbness in arms or legs:     Sudden onset of difficulty speaking or slurred speech:    Temporary loss of vision in one eye:     Problems with dizziness:         Gastrointestinal    Blood in stool:     Vomited blood:         Genitourinary    Burning when urinating:     Blood in urine:        Psychiatric    Major depression:         Hematologic    Bleeding problems:    Problems with blood clotting too easily:        Skin    Rashes or ulcers:        Constitutional    Fever or chills:      PHYSICAL EXAM: Vitals:     05/08/16 0904  BP: 114/70  Pulse: 94  Resp: 16  Temp: 97.2 F (36.2 C)  TempSrc: Oral  SpO2: 98%  Weight: 138 lb (62.6 kg)  Height: '5\' 8"'$  (1.727 m)    GENERAL: The patient is a well-nourished female, in no acute distress. The vital signs are documented above. CARDIAC: There is a regular rate and rhythm.  VASCULAR: Nonpalpable pedal pulses.  No carotid bruits. PULMONARY: There is good air exchange bilaterally without wheezing or rales. ABDOMEN: Soft and non-tender with normal pitched bowel sounds.  MUSCULOSKELETAL: There are no major deformities or cyanosis. NEUROLOGIC: No focal weakness or paresthesias are detected. SKIN: There are no ulcers or rashes noted. PSYCHIATRIC: The patient has a normal affect.  DATA:  Carotid duplex: 1-39 percent CT angiogram: 1. Progressive eccentric intraluminal thrombus in the infrarenal ectatic aorta resulting in aortic stenosis of possible hemodynamic significance. 2. LEFT common iliac artery occlusion with collateral reconstitution at the common femoral level. Patent outflow and three-vessel runoff. 3. No high-grade RIGHT lower extremity inflow or outflow disease. There is diseased two-vessel peroneal and posterior tibial runoff. 4. Proximal occlusion of the SMA, with reconstitution by visceral collaterals.  ASSESSMENT AND PLAN: Lifestyle limiting claudication: I discussed with the patient that her best option for revascularization is an aortobifemoral bypass graft.  I discussed risks and benefits of the operation including the risk of cardiopulmonary complications, death, intestinal ischemia, renal insufficiency and distal ischemia.  All of her questions were answered and she is eager to proceed.  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  I discussed possible super mesenteric artery bypass, or at minimum reimplanting her inferior mesenteric artery.  I will get a formal pulmonary and cardiac  clearance prior to proceeding with her operation which is been scheduled for November 29   Annamarie Major, MD [facility] [phone] Pager [phone]

## 2016-05-15 ENCOUNTER — Other Ambulatory Visit: Payer: Self-pay

## 2016-05-19 ENCOUNTER — Telehealth: Payer: Self-pay | Admitting: Internal Medicine

## 2016-05-19 DIAGNOSIS — I739 Peripheral vascular disease, unspecified: Secondary | ICD-10-CM | POA: Diagnosis not present

## 2016-05-19 DIAGNOSIS — R768 Other specified abnormal immunological findings in serum: Secondary | ICD-10-CM | POA: Diagnosis not present

## 2016-05-19 DIAGNOSIS — M2550 Pain in unspecified joint: Secondary | ICD-10-CM | POA: Diagnosis not present

## 2016-05-19 DIAGNOSIS — J449 Chronic obstructive pulmonary disease, unspecified: Secondary | ICD-10-CM | POA: Diagnosis not present

## 2016-05-19 DIAGNOSIS — I781 Nevus, non-neoplastic: Secondary | ICD-10-CM | POA: Diagnosis not present

## 2016-05-19 NOTE — Telephone Encounter (Signed)
Kayla Sawyer  She is high risk for any pulmonary complication (atelectasis , pna etc.,) baed on CANET and 8% for post op pneumonia [name])  High -moderate risk for prolonged vent dependent > 5 days baesd on Arozullah  Risk mainly comes from fact she has extensive long surgery and open lap in setting of lung diseaes though the lung diseae itself is no severe (she did not desatuate with waling)   Risk ameliorating factors are good nutrition, good renal funcion and independent status and age < 25   Major Pulmonary risks identified in the multifactorial risk analysis are but not limited to a) pneumonia; b) recurrent intubation risk; c) prolonged or recurrent acute respiratory failure needing mechanical ventilation; d) prolonged hospitalization; e) DVT/Pulmonary embolism; f) Acute Pulmonary edema  Recommend 1. Short duration of surgery as much as possible and avoid paralytic if possible 2. Recovery in step down or ICU with Pulmonary consultation 3. DVT prophylaxis 4. Aggressive pulmonary toilet with o2, bronchodilatation, and incentive spirometry and early ambulation   Hope that helps  If you want [place] to see her to explain direct fact to face will be happy to   Let me know  Thanks  Dr. Brand Males, M.D., [facility] An.C.P Pulmonary and Critical Care Medicine Staff Physician [place] [facility] Pager: [phone], If no answer or between  15:00h - 7:00h: call [phone]  05/19/2016 5:03 PM      ........................Marland Kitchen   preop paper request by [facility]  Synopsis  Arozullah Postperative Pulmonary Risk Score - risk for > 5d on vent Comment Score  Type of surgery - abd ao aneurysm (27), thoracic (21), neurosurgery / upper abdominal / vascular (21), neck (11) Whole lap 27  Emergency Surgery - (11) elective 0  ALbumin < 3 or poor nutritional state - (9) 3.9 - June 2001 0  BUN > 30 -  (8) 26 - July 2017 0  Partial or completely dependent functional  status - (7) functional 0  COPD -  (6) Emphysema/ild 6  Age - 46 to 6 (4), > 70  (6) Age 87 4  TOTAL  37  Risk Stratifcation scores  - < 10, 11-19, 20-27, 28-40, >40  High moderate      CANET Postperative Pulmonary Risk Score - any pulm com Comment Score  Age - <50 (0), 50-80 (3), >80 (16) 69 3  Preoperative pulse ox - >96 (0), 91-95 (8), <90 (24) 94% 8  Respiratory infection in last month - Yes (17) no 0  Preoperative anemia - < 10gm% - Yes (11) 15gm% jjulyu 0  Surgical incision - Upper abdominal (15), Thoracic (24) abd 15  Duration of surgery - <2h (0), 2-3h (16), >3h (23) 5h per [facility] 23  Emergency Surgery - Yes (8) elective 0  TOTAL  49  Risk Stratification - Low (<26), Intermediate (26-44), High (>45)  high    Gupta for Postop pneumoniaa -  reports that she has been smoking Cigarettes.  She has a 50.00 pack-year smoking history. She has never used smokeless tobacco. is 8%

## 2016-05-24 ENCOUNTER — Encounter: Payer: Self-pay | Admitting: Surgery

## 2016-06-05 NOTE — Pre-Procedure Instructions (Signed)
Kayla Sawyer  06/05/2016      [facility] - [place], [place] [place] [place] [address] Phone: [phone] Fax: [phone]  [place], [address] [place] [address] [place] [address] Phone: [phone] Fax: [phone]    Your procedure is scheduled on Wednesday November 29.  Report to [facility] Admitting at 6:30 A.M.  Call this number if you have problems the morning of surgery:  [phone]   Remember:  Do not eat food or drink liquids after midnight.  Take these medicines the morning of surgery with A SIP OF WATER: tyenol if needed, omeprazole (prilosec), sertraline (Zoloft), Spiriva inhaler  Follow Dr. Stephens Shire instructions on taking Aspirin  7 days prior to surgery STOP taking anyAleve, Naproxen, Ibuprofen, Motrin, Advil, Goody's, BC's, all herbal medications, fish oil, and all vitamins    Do not wear jewelry, make-up or nail polish.  Do not wear lotions, powders, or perfumes, or deoderant.  Do not shave 48 hours prior to surgery.  Men may shave face and neck.  Do not bring valuables to the hospital.  [facility] is not responsible for any belongings or valuables.  Contacts, dentures or bridgework may not be worn into surgery.  Leave your suitcase in the car.  After surgery it may be brought to your room.  For patients admitted to the hospital, discharge time will be determined by your treatment team.  Patients discharged the day of surgery will not be allowed to drive home.   Special instructions:    [place]- Preparing For Surgery  Before surgery, you can play an important role. Because skin is not sterile, your skin needs to be as free of germs as possible. You can reduce the number of germs on your skin by washing with CHG (chlorahexidine gluconate) Soap before surgery.  CHG is an antiseptic cleaner  which kills germs and bonds with the skin to continue killing germs even after washing.  Please do not use if you have an allergy to CHG or antibacterial soaps. If your skin becomes reddened/irritated stop using the CHG.  Do not shave (including legs and underarms) for at least 48 hours prior to first CHG shower. It is OK to shave your face.  Please follow these instructions carefully.   1. Shower the NIGHT BEFORE SURGERY and the MORNING OF SURGERY with CHG.   2. If you chose to wash your hair, wash your hair first as usual with your normal shampoo.  3. After you shampoo, rinse your hair and body thoroughly to remove the shampoo.  4. Use CHG as you would any other liquid soap. You can apply CHG directly to the skin and wash gently with a scrungie or a clean washcloth.   5. Apply the CHG Soap to your body ONLY FROM THE NECK DOWN.  Do not use on open wounds or open sores. Avoid contact with your eyes, ears, mouth and genitals (private parts). Wash genitals (private parts) with your normal soap.  6. Wash thoroughly, paying special attention to the area where your surgery will be performed.  7. Thoroughly rinse your body with warm water from the neck down.  8. DO NOT shower/wash with your normal soap after using and rinsing off the CHG Soap.  9. Pat yourself dry with a CLEAN TOWEL.   10.  [place]   11. Place CLEAN SHEETS on your bed the night of your first shower and DO NOT SLEEP WITH PETS.    Day of Surgery: Do not apply any deodorants/lotions. Please wear clean clothes to the hospital/surgery center.      Please read over the following fact sheets that you were given. MRSA Information

## 2016-06-06 ENCOUNTER — Encounter (HOSPITAL_COMMUNITY)
Admission: RE | Admit: 2016-06-06 | Discharge: 2016-06-06 | Disposition: A | Discharge status: Home / Self Care | Payer: Medicare Other | Source: Ambulatory Visit | Attending: Surgery | Admitting: Surgery

## 2016-06-06 ENCOUNTER — Encounter (HOSPITAL_COMMUNITY): Payer: Self-pay

## 2016-06-06 DIAGNOSIS — Z86010 Personal history of colonic polyps: Secondary | ICD-10-CM | POA: Diagnosis not present

## 2016-06-06 DIAGNOSIS — Z01812 Encounter for preprocedural laboratory examination: Secondary | ICD-10-CM | POA: Diagnosis not present

## 2016-06-06 DIAGNOSIS — R911 Solitary pulmonary nodule: Secondary | ICD-10-CM | POA: Diagnosis not present

## 2016-06-06 DIAGNOSIS — R942 Abnormal results of pulmonary function studies: Secondary | ICD-10-CM | POA: Insufficient documentation

## 2016-06-06 DIAGNOSIS — G5620 Lesion of ulnar nerve, unspecified upper limb: Secondary | ICD-10-CM | POA: Insufficient documentation

## 2016-06-06 DIAGNOSIS — I739 Peripheral vascular disease, unspecified: Secondary | ICD-10-CM | POA: Diagnosis not present

## 2016-06-06 DIAGNOSIS — J449 Chronic obstructive pulmonary disease, unspecified: Secondary | ICD-10-CM | POA: Diagnosis not present

## 2016-06-06 DIAGNOSIS — J432 Centrilobular emphysema: Secondary | ICD-10-CM | POA: Diagnosis not present

## 2016-06-06 DIAGNOSIS — M545 Low back pain: Secondary | ICD-10-CM | POA: Insufficient documentation

## 2016-06-06 DIAGNOSIS — F17200 Nicotine dependence, unspecified, uncomplicated: Secondary | ICD-10-CM | POA: Diagnosis not present

## 2016-06-06 DIAGNOSIS — N179 Acute kidney failure, unspecified: Secondary | ICD-10-CM | POA: Diagnosis not present

## 2016-06-06 DIAGNOSIS — K219 Gastro-esophageal reflux disease without esophagitis: Secondary | ICD-10-CM | POA: Diagnosis not present

## 2016-06-06 DIAGNOSIS — R627 Adult failure to thrive: Secondary | ICD-10-CM | POA: Diagnosis not present

## 2016-06-06 DIAGNOSIS — D72829 Elevated white blood cell count, unspecified: Secondary | ICD-10-CM | POA: Insufficient documentation

## 2016-06-06 HISTORY — DX: Major depressive disorder, single episode, unspecified: F32.9

## 2016-06-06 HISTORY — DX: Gastro-esophageal reflux disease without esophagitis: K21.9

## 2016-06-06 HISTORY — DX: Unspecified osteoarthritis, unspecified site: M19.90

## 2016-06-06 HISTORY — DX: Pulmonary hypertension, unspecified: I27.20

## 2016-06-06 HISTORY — DX: Depression, unspecified: F32.A

## 2016-06-06 LAB — URINALYSIS, ROUTINE W REFLEX MICROSCOPIC
Bilirubin Urine: NEGATIVE
Glucose, UA: NEGATIVE mg/dL
Hgb urine dipstick: NEGATIVE
Ketones, ur: NEGATIVE mg/dL
Leukocytes, UA: NEGATIVE
Nitrite: NEGATIVE
Protein, ur: NEGATIVE mg/dL
Specific Gravity, Urine: 1.021 (ref 1.005–1.030)
pH: 5 (ref 5.0–8.0)

## 2016-06-06 LAB — COMPREHENSIVE METABOLIC PANEL
ALT: 11 U/L — ABNORMAL LOW (ref 14–54)
AST: 15 U/L (ref 15–41)
Albumin: 3.8 g/dL (ref 3.5–5.0)
Alkaline Phosphatase: 44 U/L (ref 38–126)
Anion gap: 10 (ref 5–15)
BUN: 26 mg/dL — ABNORMAL HIGH (ref 6–20)
CO2: 19 mmol/L — ABNORMAL LOW (ref 22–32)
Calcium: 9 mg/dL (ref 8.9–10.3)
Chloride: 110 mmol/L (ref 101–111)
Creatinine, Ser: 0.99 mg/dL (ref 0.44–1.00)
GFR calc Af Amer: >60 mL/min (ref >60)
GFR calc non Af Amer: 57 mL/min — ABNORMAL LOW (ref >60)
Glucose, Bld: 97 mg/dL (ref 65–99)
Potassium: 4.3 mmol/L (ref 3.5–5.1)
Sodium: 139 mmol/L (ref 135–145)
Total Bilirubin: 0.4 mg/dL (ref 0.3–1.2)
Total Protein: 6.4 g/dL — ABNORMAL LOW (ref 6.5–8.1)

## 2016-06-06 LAB — CBC
HCT: 44 % (ref 36.0–46.0)
Hemoglobin: 14.8 g/dL (ref 12.0–15.0)
MCH: 30.5 pg (ref 26.0–34.0)
MCHC: 33.6 g/dL (ref 30.0–36.0)
MCV: 90.7 fL (ref 78.0–100.0)
Platelets: 236 10*3/uL (ref 150–400)
RBC: 4.85 MIL/uL (ref 3.87–5.11)
RDW: 15 % (ref 11.5–15.5)
WBC: 6.7 10*3/uL (ref 4.0–10.5)

## 2016-06-06 LAB — PROTIME-INR
INR: 1.03
Prothrombin Time: 13.5 seconds (ref 11.4–15.2)

## 2016-06-06 LAB — SURGICAL PCR SCREEN
MRSA, PCR: NEGATIVE
Staphylococcus aureus: NEGATIVE

## 2016-06-06 LAB — ABO/RH: ABO/RH(D): O POS

## 2016-06-06 LAB — BLOOD GAS, ARTERIAL
Acid-base deficit: 1.4 mmol/L (ref 0.0–2.0)
Bicarbonate: 21.9 mmol/L (ref 20.0–28.0)
Drawn by: 449841
FIO2: 21
O2 Saturation: 96.7 %
Patient temperature: 98.6
pCO2 arterial: 31 mmHg — ABNORMAL LOW (ref 32.0–48.0)
pH, Arterial: 7.462 — ABNORMAL HIGH (ref 7.350–7.450)
pO2, Arterial: 81.9 mmHg — ABNORMAL LOW (ref 83.0–108.0)

## 2016-06-06 LAB — APTT: aPTT: 29 seconds (ref 24–36)

## 2016-06-06 NOTE — Progress Notes (Signed)
PCP: Delphina Cahill in [place] Cardiologist: Dr. Haroldine Laws Pulmonologist: Dr. Chase Caller  EKG: 12/27/15 Echo: 12/27/15 Cath: 01/03/16 PFTs: 12/09/15 Chest CT: 12/30/15  Pt with no complaints of chest pain, SOB, signs of infection at PAT appointment. Pt states she was instructed by Dr. Trula Slade to continue Aspirin until day of surgery. Chart forwarded to anesthesia for review.

## 2016-06-06 NOTE — Progress Notes (Signed)
Anesthesia Chart Review: Patient is a 70 year old female scheduled for AFBG, possible SMA bypass on 06/14/16 by Dr. Trula Slade.  History includes smoking (50 pack years),COPD/emphysema ("ILD no UIP pattern"), pulmonary hypertension (no significant PAH noted on 12/2015 RHC), depression, GERD, arthritis, hysterectomy, back surgery, E. Coli pyelonephritis. She is also being evaluated by rheumatology for possible scleroderma per chest CT findings. She also has a RUL nodule that will be re-evaluated ~ 07/2016. Mild CAD by 12/2015 cardiac cath.  PCP is Dr. Delphina Cahill in [place]. Rheumatologist is Dr. Gavin Pound.  Cardiologist is Dr. Haroldine Laws (saw on 12/27/15 for evaluation for PAH in the setting of probable scleroderma, DLCO 29%). 12/2015 echo and RHC did not show any significant PAH. She also saw Dr. Fletcher Anon for [facility] cardiology. On 03/14/16, he wrote, "Cardiac catheterization in June showed no obstructive coronary artery disease and no evidence of pulmonary hypertension. Ejection fraction was normal. Thus, she is at low risk from a cardiac standpoint. I think the biggest issue going to be her lung disease and we have to get clearance from Dr. Chase Caller."  Pulmonologist is Dr. Chase Caller, last office visit 03/30/16. Next high resolution chest CT is scheduled for 07/2016. He is deferring pulmonary rehab until her vascular issues get sorted out. Sprivia continued. Smoking cessation encouraged. On 05/19/16, his pre-operative pulmonary risk assessment stated: "She is high risk for any pulmonary complication (atelectasis , pna etc.,) baed on CANET and 8% for post op pneumonia [name])  High -moderate risk for prolonged vent dependent > 5 days baesd on Arozullah Risk mainly comes from fact she has extensive long surgery and open lap in setting of lung diseaes though the lung diseae itself is no severe (she did not desatuate with waling) Risk ameliorating factors are good nutrition, good renal funcion and independent status and  age < 23 Major Pulmonary risks identified in the multifactorial risk analysis are but not limited to a) pneumonia; b) recurrent intubation risk; c) prolonged or recurrent acute respiratory failure needing mechanical ventilation; d) prolonged hospitalization; e) DVT/Pulmonary embolism; f) Acute Pulmonary edema Recommend 1. Short duration of surgery as much as possible and avoid paralytic if possible 2. Recovery in step down or ICU with Pulmonary consultation 3. DVT prophylaxis 4. Aggressive pulmonary toilet with o2, bronchodilatation, and incentive spirometry and early ambulation"  Meds include ASA 81 mg, Lipitor, Chantix, Prilosec, Zoloft, Spiriva Respimat.  BP 119/61   Pulse 95   Temp 36.4 C   Resp 20   Ht '5\' 8"'$  (1.727 m)   Wt 141 lb (64 kg)   SpO2 96%   BMI 21.44 kg/m   EKG 12/27/15: NSR, right atrial enlargement, rightward axis, pulmonary disease pattern.   Echo 12/27/15: Study Conclusions - Left ventricle: The cavity size was normal. Wall thickness was   normal. Systolic function was normal. The estimated ejection   fraction was in the range of 60% to 65%. Wall motion was normal;   there were no regional wall motion abnormalities. Doppler   parameters are consistent with abnormal left ventricular   relaxation (grade 1 diastolic dysfunction). - Mitral valve: Calcified annulus. - Impressions: No echocardiographic stigmata of pulmonary HTN. Impressions: - No echocardiographic stigmata of pulmonary HTN.  RHC/LHC 01/03/16 (Dr. Haroldine Laws):  Prox RCA to Mid RCA lesion, 30% stenosed.  Mid RCA to Dist RCA lesion, 20% stenosed.  Mid LAD lesion, 40% stenosed.  2nd Diag lesion, 50% stenosed.  Prox LAD to Mid LAD lesion, 20% stenosed. Findings: RA = 1 RV = 38/0/4  PA = 40/11 (26) PCW = 7 Fick cardiac output/index = 3.5/2.0 PVR = 5.0 WU Ao sat = 90% PA sat = 58%, 58% Assessment: 1. Mild non-obstructive CAD with separate ostia for LAD and LCX 2. Minimally elevated R-sided  pressures 3. Normal LV function 4. Moderately reduced cardiac output Plan/Discussion: Suspect dyspnea mostly due to lung disease. No significant PAH.  Carotid U/S 12/31/15: Summary: Findings suggest 1-39% internal carotid artery stenosis bilaterally. Vertebral arteries are patent with antegrade flow.  Abdominal Aortogram with LE runoff 02/16/16(Dr. Kathlyn Sacramento): 1. Small abdominal aortic aneurysm. 2. Severe calcified disease affecting the right common iliac artery with moderate calcified disease affecting the right common femoral artery and no infrainguinal disease. 3. 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 Recommendations: The left iliac occlusion is long and extends into the proximal common femoral artery and thus not ideal for endovascular intervention. Aortobifemoral bypass with right common femoral artery endarterectomy is the ideal revascularization strategy. However, the patient might not be a candidate for this due to lung disease. In that situation, we should consider a hybrid approach of right common iliac artery endovascular intervention followed by right common femoral artery endarterectomy with right-to-left femorofemoral bypass.  Chest CT 12/30/15: IMPRESSION: 1. Spiculated 6 mm basilar right upper lobe pulmonary nodule, new since 2012. Follow-up chest CT is recommended in 3-6 months. 2. Mild patchy subpleural ground-glass attenuation and reticulation in both lungs, basilar predominant. No significant traction bronchiectasis or frank honeycombing. Findings suggest nonspecific interstitial pneumonia (NSIP) due to scleroderma. 3. Moderate centrilobular and paraseptal emphysema with diffuse bronchial wall thickening, suggesting COPD. 4. Mildly patulous thoracic esophagus, characteristic of scleroderma. 5. No thoracic adenopathy. 6. Left main and 3 vessel  coronary atherosclerosis. 7. Stable chronic mild pericardial effusion/thickening.  PFTs 12/09/15: FVC 2.44 (77%), FEV1 1.28 (53%), FEV1/FVC ratio 52% (68%), DLCOunc 7.59 (29%).  Walking desaturation test on 01/07/2016: 185 feet x 3 laps:  did NOT desaturate. Rest pulse ox was 94, final pulse ox was 92%. HR response 81/min at rest to 116/min at peak exertion. = - Walk only one lap and stopped due to significant fatigue and back significantly tachycardic.  Preoperative labs noted. ABG showed pH 7.462, PCO2 31.0, PO2 81.9, bicarbonate 21.9.  If no acute changes in her cardiopulmonary status then I would anticipate that she can proceed as planned.  [facility] Short Stay Center/Anesthesiology Phone [phone] 06/06/2016 5:20 PM

## 2016-06-07 ENCOUNTER — Encounter: Payer: Self-pay | Admitting: Surgery

## 2016-06-12 ENCOUNTER — Encounter: Payer: Self-pay | Admitting: Surgery

## 2016-06-12 ENCOUNTER — Ambulatory Visit (INDEPENDENT_AMBULATORY_CARE_PROVIDER_SITE_OTHER): Payer: Medicare Other | Admitting: Surgery

## 2016-06-12 VITALS — BP 119/71 | HR 83 | Temp 97.9°F | Resp 18 | Ht 68.0 in | Wt 142.0 lb

## 2016-06-12 DIAGNOSIS — I70213 Atherosclerosis of native arteries of extremities with intermittent claudication, bilateral legs: Secondary | ICD-10-CM | POA: Diagnosis not present

## 2016-06-12 NOTE — Progress Notes (Signed)
Vascular and Vein Specialist of [place]  Patient name: Kayla Sawyer MRN: [id number] DOB: January 24, 1946 Sex: female  REASON FOR VISIT: Pre-op  HPI: Kayla Sawyer is a 70 y.o. female, who is referred today for evaluation of bilateral claudication.  She states that she has significant issues with ambulation.  She can hardly get around her house.  She denies having nonhealing wounds.  She has recently undergone catheterization which shows left iliac occlusion and significant calcific stenosis within her infrarenal aorta.  This is also confirmed on CT scan.  She is referred for aortobifemoral bypass graft.  The patient has a 50+-pack-year history of smoking and has COPD.  She is followed by pulmonary and is also suspected to have scleroderma.  She recently had cardiac catheterization which showed mild obstructive disease and no pulmonary hypertension.  She suffers from hypercholesterolemia which is managed with a statin.  She is on a baby aspirin for antiplatelet therapy.  She is here today to discuss the pulmonary risk factors for surgery which is scheduled for this Wednesday  Past Medical History:  Diagnosis Date  . Arthritis   . COPD (chronic obstructive pulmonary disease) ([place])   . Depression   . E. coli pyelonephritis 6//2014  . GERD (gastroesophageal reflux disease)   . Pneumonia   . Pulmonary hypertension   . Sepsis ([place])     Family History  Problem Relation Age of Onset  . Diabetes Mother   . Emphysema Father     SOCIAL HISTORY: Social History  Substance Use Topics  . Smoking status: Current Every Day Smoker    Packs/day: 1.00    Years: 50.00    Types: Cigarettes  . Smokeless tobacco: Never Used     Comment: currently smokes 0.5 pack/day 06/06/16  . Alcohol use No    Allergies  Allergen Reactions  . Sulfur Nausea Only    Current Outpatient Prescriptions  Medication Sig Dispense Refill  . acetaminophen (TYLENOL) 500 MG tablet  Take 500 mg by mouth every 6 (six) hours as needed for mild pain.    Marland Kitchen aspirin EC 81 MG tablet Take 1 tablet (81 mg total) by mouth daily.    Marland Kitchen atorvastatin (LIPITOR) 20 MG tablet Take 1 tablet (20 mg total) by mouth daily. 90 tablet 3  . CHANTIX 1 MG tablet Take 1 mg by mouth 2 (two) times daily.    . cholecalciferol (VITAMIN D) 1000 units tablet Take 1,000 Units by mouth daily.    . Multiple Vitamin (MULTIVITAMIN WITH MINERALS) TABS tablet Take 1 tablet by mouth daily.    Marland Kitchen omeprazole (PRILOSEC) 20 MG capsule Take 20 mg by mouth daily.     . sertraline (ZOLOFT) 50 MG tablet Take 50 mg by mouth daily.    . Tiotropium Bromide Monohydrate (SPIRIVA RESPIMAT) 2.5 MCG/ACT AERS Inhale 2 puffs into the lungs daily. 3 Inhaler 4   No current facility-administered medications for this visit.     REVIEW OF SYSTEMS:  '[X]'$  denotes positive finding, '[ ]'$  denotes negative finding Cardiac  Comments:  Chest pain or chest pressure:    Shortness of breath upon exertion:    Short of breath when lying flat:    Irregular heart rhythm:        Vascular    Pain in calf, thigh, or hip brought on by ambulation: x   Pain in feet at night that wakes you up from your sleep:     Blood clot in your veins:  Leg swelling:         Pulmonary    Oxygen at home:    Productive cough:     Wheezing:         Neurologic    Sudden weakness in arms or legs:     Sudden numbness in arms or legs:     Sudden onset of difficulty speaking or slurred speech:    Temporary loss of vision in one eye:     Problems with dizziness:         Gastrointestinal    Blood in stool:     Vomited blood:         Genitourinary    Burning when urinating:     Blood in urine:        Psychiatric    Major depression:         Hematologic    Bleeding problems:    Problems with blood clotting too easily:        Skin    Rashes or ulcers:        Constitutional    Fever or chills:      PHYSICAL EXAM: Vitals:   06/12/16 1436  BP:  119/71  Pulse: 83  Resp: 18  Temp: 97.9 F (36.6 C)  TempSrc: Oral  SpO2: 93%  Weight: 142 lb (64.4 kg)  Height: '5\' 8"'$  (1.727 m)    GENERAL: The patient is a well-nourished female, in no acute distress. The vital signs are documented above. CARDIAC: There is a regular rate and rhythm.  PULMONARY: Nonlabored respirations ABDOMEN: Soft and non-tender  MUSCULOSKELETAL: There are no major deformities or cyanosis. NEUROLOGIC: No focal weakness or paresthesias are detected. SKIN: There are no ulcers or rashes noted. PSYCHIATRIC: The patient has a normal affect.  DATA:  None  MEDICAL ISSUES: The patient is scheduled for an aortobifemoral bypass graft with possible superior mesenteric artery bypass this Wednesday, November 29.  I brought her into discussed the pulmonary risk factors after pulmonary referral.  We discussed today her risk of pneumonia and prolonged intubation and possible tracheotomy.  She understands these risks and wishes to proceed.    Annamarie Major, MD [facility] Specialists of [facility] [phone] Pager [phone]

## 2016-06-14 ENCOUNTER — Inpatient Hospital Stay (HOSPITAL_COMMUNITY): Payer: Medicare Other | Admitting: Anesthesiology

## 2016-06-14 ENCOUNTER — Inpatient Hospital Stay (HOSPITAL_COMMUNITY)
Admission: RE | Admit: 2016-06-14 | Discharge: 2016-06-20 | DRG: 270 | Disposition: A | Discharge status: Home Health | Payer: Medicare Other | Source: Ambulatory Visit | Attending: Surgery | Admitting: Surgery

## 2016-06-14 ENCOUNTER — Encounter (HOSPITAL_COMMUNITY): Payer: Self-pay | Admitting: *Deleted

## 2016-06-14 ENCOUNTER — Inpatient Hospital Stay (HOSPITAL_COMMUNITY): Payer: Medicare Other

## 2016-06-14 ENCOUNTER — Inpatient Hospital Stay (HOSPITAL_COMMUNITY): Payer: Medicare Other | Admitting: Vascular Surgery

## 2016-06-14 ENCOUNTER — Encounter (HOSPITAL_COMMUNITY)
Admission: RE | Disposition: A | Discharge status: Home Health | Payer: Self-pay | Source: Ambulatory Visit | Attending: Surgery

## 2016-06-14 DIAGNOSIS — I959 Hypotension, unspecified: Secondary | ICD-10-CM | POA: Diagnosis not present

## 2016-06-14 DIAGNOSIS — R0600 Dyspnea, unspecified: Secondary | ICD-10-CM

## 2016-06-14 DIAGNOSIS — F329 Major depressive disorder, single episode, unspecified: Secondary | ICD-10-CM | POA: Diagnosis present

## 2016-06-14 DIAGNOSIS — N179 Acute kidney failure, unspecified: Secondary | ICD-10-CM | POA: Diagnosis not present

## 2016-06-14 DIAGNOSIS — I70213 Atherosclerosis of native arteries of extremities with intermittent claudication, bilateral legs: Secondary | ICD-10-CM | POA: Diagnosis not present

## 2016-06-14 DIAGNOSIS — E7800 Pure hypercholesterolemia, unspecified: Secondary | ICD-10-CM | POA: Diagnosis not present

## 2016-06-14 DIAGNOSIS — R000 Tachycardia, unspecified: Secondary | ICD-10-CM | POA: Diagnosis not present

## 2016-06-14 DIAGNOSIS — I743 Embolism and thrombosis of arteries of the lower extremities: Secondary | ICD-10-CM | POA: Diagnosis not present

## 2016-06-14 DIAGNOSIS — I745 Embolism and thrombosis of iliac artery: Secondary | ICD-10-CM | POA: Diagnosis not present

## 2016-06-14 DIAGNOSIS — E878 Other disorders of electrolyte and fluid balance, not elsewhere classified: Secondary | ICD-10-CM | POA: Diagnosis present

## 2016-06-14 DIAGNOSIS — Z7982 Long term (current) use of aspirin: Secondary | ICD-10-CM | POA: Diagnosis not present

## 2016-06-14 DIAGNOSIS — Z79899 Other long term (current) drug therapy: Secondary | ICD-10-CM | POA: Diagnosis not present

## 2016-06-14 DIAGNOSIS — J9611 Chronic respiratory failure with hypoxia: Secondary | ICD-10-CM

## 2016-06-14 DIAGNOSIS — R739 Hyperglycemia, unspecified: Secondary | ICD-10-CM | POA: Diagnosis not present

## 2016-06-14 DIAGNOSIS — I97638 Postprocedural hematoma of a circulatory system organ or structure following other circulatory system procedure: Secondary | ICD-10-CM | POA: Diagnosis not present

## 2016-06-14 DIAGNOSIS — I350 Nonrheumatic aortic (valve) stenosis: Secondary | ICD-10-CM | POA: Diagnosis not present

## 2016-06-14 DIAGNOSIS — K219 Gastro-esophageal reflux disease without esophagitis: Secondary | ICD-10-CM | POA: Diagnosis present

## 2016-06-14 DIAGNOSIS — D696 Thrombocytopenia, unspecified: Secondary | ICD-10-CM | POA: Diagnosis not present

## 2016-06-14 DIAGNOSIS — Z95828 Presence of other vascular implants and grafts: Secondary | ICD-10-CM | POA: Diagnosis not present

## 2016-06-14 DIAGNOSIS — J449 Chronic obstructive pulmonary disease, unspecified: Secondary | ICD-10-CM | POA: Diagnosis not present

## 2016-06-14 DIAGNOSIS — J811 Chronic pulmonary edema: Secondary | ICD-10-CM

## 2016-06-14 DIAGNOSIS — I2510 Atherosclerotic heart disease of native coronary artery without angina pectoris: Secondary | ICD-10-CM | POA: Diagnosis present

## 2016-06-14 DIAGNOSIS — I7409 Other arterial embolism and thrombosis of abdominal aorta: Secondary | ICD-10-CM | POA: Diagnosis present

## 2016-06-14 DIAGNOSIS — F17210 Nicotine dependence, cigarettes, uncomplicated: Secondary | ICD-10-CM | POA: Diagnosis present

## 2016-06-14 DIAGNOSIS — D62 Acute posthemorrhagic anemia: Secondary | ICD-10-CM | POA: Diagnosis not present

## 2016-06-14 DIAGNOSIS — I272 Pulmonary hypertension, unspecified: Secondary | ICD-10-CM | POA: Diagnosis present

## 2016-06-14 DIAGNOSIS — Z4682 Encounter for fitting and adjustment of non-vascular catheter: Secondary | ICD-10-CM | POA: Diagnosis not present

## 2016-06-14 DIAGNOSIS — I700 Atherosclerosis of aorta: Secondary | ICD-10-CM | POA: Diagnosis present

## 2016-06-14 DIAGNOSIS — R269 Unspecified abnormalities of gait and mobility: Secondary | ICD-10-CM | POA: Diagnosis not present

## 2016-06-14 DIAGNOSIS — J95821 Acute postprocedural respiratory failure: Secondary | ICD-10-CM | POA: Diagnosis not present

## 2016-06-14 DIAGNOSIS — D649 Anemia, unspecified: Secondary | ICD-10-CM | POA: Diagnosis not present

## 2016-06-14 DIAGNOSIS — J9811 Atelectasis: Secondary | ICD-10-CM | POA: Diagnosis not present

## 2016-06-14 DIAGNOSIS — R0902 Hypoxemia: Secondary | ICD-10-CM | POA: Diagnosis not present

## 2016-06-14 DIAGNOSIS — J849 Interstitial pulmonary disease, unspecified: Secondary | ICD-10-CM | POA: Diagnosis not present

## 2016-06-14 DIAGNOSIS — I739 Peripheral vascular disease, unspecified: Secondary | ICD-10-CM | POA: Diagnosis not present

## 2016-06-14 DIAGNOSIS — G8918 Other acute postprocedural pain: Secondary | ICD-10-CM | POA: Diagnosis not present

## 2016-06-14 DIAGNOSIS — J9601 Acute respiratory failure with hypoxia: Secondary | ICD-10-CM

## 2016-06-14 DIAGNOSIS — J439 Emphysema, unspecified: Secondary | ICD-10-CM | POA: Diagnosis present

## 2016-06-14 DIAGNOSIS — J810 Acute pulmonary edema: Secondary | ICD-10-CM | POA: Diagnosis not present

## 2016-06-14 HISTORY — PX: AORTA - BILATERAL FEMORAL ARTERY BYPASS GRAFT: SHX1175

## 2016-06-14 LAB — POCT I-STAT 3, ART BLOOD GAS (G3+)
Acid-base deficit: 3 mmol/L — ABNORMAL HIGH (ref 0.0–2.0)
Bicarbonate: 24.6 mmol/L (ref 20.0–28.0)
O2 Saturation: 99 %
Patient temperature: 99.2
TCO2: 26 mmol/L (ref 0–100)
pCO2 arterial: 54 mmHg — ABNORMAL HIGH (ref 32.0–48.0)
pH, Arterial: 7.269 — ABNORMAL LOW (ref 7.350–7.450)
pO2, Arterial: 144 mmHg — ABNORMAL HIGH (ref 83.0–108.0)

## 2016-06-14 LAB — APTT
aPTT: >200 seconds (ref 24–36)
aPTT: 37 seconds — ABNORMAL HIGH (ref 24–36)

## 2016-06-14 LAB — PROTIME-INR
INR: 1.37
INR: 1.63
Prothrombin Time: 17 seconds — ABNORMAL HIGH (ref 11.4–15.2)
Prothrombin Time: 19.5 s — ABNORMAL HIGH (ref 11.4–15.2)

## 2016-06-14 LAB — BASIC METABOLIC PANEL
Anion gap: 3 — ABNORMAL LOW (ref 5–15)
BUN: 17 mg/dL (ref 6–20)
CO2: 24 mmol/L (ref 22–32)
Calcium: 10.3 mg/dL (ref 8.9–10.3)
Chloride: 112 mmol/L — ABNORMAL HIGH (ref 101–111)
Creatinine, Ser: 1.16 mg/dL — ABNORMAL HIGH (ref 0.44–1.00)
GFR calc Af Amer: 54 mL/min — ABNORMAL LOW (ref >60)
GFR calc non Af Amer: 47 mL/min — ABNORMAL LOW (ref >60)
Glucose, Bld: 154 mg/dL — ABNORMAL HIGH (ref 65–99)
Potassium: 4.2 mmol/L (ref 3.5–5.1)
Sodium: 139 mmol/L (ref 135–145)

## 2016-06-14 LAB — CBC
HCT: 33.1 % — ABNORMAL LOW (ref 36.0–46.0)
Hemoglobin: 11.2 g/dL — ABNORMAL LOW (ref 12.0–15.0)
MCH: 30.6 pg (ref 26.0–34.0)
MCHC: 33.8 g/dL (ref 30.0–36.0)
MCV: 90.4 fL (ref 78.0–100.0)
Platelets: 109 10*3/uL — ABNORMAL LOW (ref 150–400)
RBC: 3.66 MIL/uL — ABNORMAL LOW (ref 3.87–5.11)
RDW: 15 % (ref 11.5–15.5)
WBC: 13 10*3/uL — ABNORMAL HIGH (ref 4.0–10.5)

## 2016-06-14 LAB — HEMOGLOBIN AND HEMATOCRIT, BLOOD
HCT: 30.6 % — ABNORMAL LOW (ref 36.0–46.0)
Hemoglobin: 10.3 g/dL — ABNORMAL LOW (ref 12.0–15.0)

## 2016-06-14 LAB — PLATELET COUNT: Platelets: 150 10*3/uL (ref 150–400)

## 2016-06-14 LAB — PREPARE RBC (CROSSMATCH)

## 2016-06-14 LAB — MAGNESIUM: Magnesium: 1.1 mg/dL — ABNORMAL LOW (ref 1.7–2.4)

## 2016-06-14 LAB — FIBRINOGEN: Fibrinogen: 250 mg/dL (ref 210–475)

## 2016-06-14 SURGERY — CREATION, BYPASS, ARTERIAL, AORTA TO FEMORAL, BILATERAL, USING GRAFT
Anesthesia: General | Site: Abdomen | Laterality: Bilateral

## 2016-06-14 MED ORDER — SUFENTANIL CITRATE 50 MCG/ML IV SOLN
INTRAVENOUS | Status: AC
  Filled 2016-06-14: qty 1

## 2016-06-14 MED ORDER — HYDRALAZINE HCL 20 MG/ML IJ SOLN: 5.0000 mg | INTRAMUSCULAR | Status: DC | PRN

## 2016-06-14 MED ORDER — DOCUSATE SODIUM 100 MG PO CAPS: 100.0000 mg | ORAL_CAPSULE | Freq: Every day | ORAL | Status: DC

## 2016-06-14 MED ORDER — PROPOFOL 10 MG/ML IV BOLUS
INTRAVENOUS | Status: DC | PRN
  Administered 2016-06-14: 120 mg via INTRAVENOUS

## 2016-06-14 MED ORDER — CHLORHEXIDINE GLUCONATE CLOTH 2 % EX PADS: 6.0000 | MEDICATED_PAD | Freq: Once | CUTANEOUS | Status: DC

## 2016-06-14 MED ORDER — PHENYLEPHRINE 40 MCG/ML (10ML) SYRINGE FOR IV PUSH (FOR BLOOD PRESSURE SUPPORT)
PREFILLED_SYRINGE | INTRAVENOUS | Status: AC
  Filled 2016-06-14: qty 10

## 2016-06-14 MED ORDER — PROTAMINE SULFATE 10 MG/ML IV SOLN
INTRAVENOUS | Status: AC
  Filled 2016-06-14: qty 5

## 2016-06-14 MED ORDER — MORPHINE SULFATE (PF) 2 MG/ML IV SOLN: 2.0000 mg | INTRAVENOUS | Status: DC | PRN

## 2016-06-14 MED ORDER — SODIUM CHLORIDE 0.9 % IJ SOLN
INTRAMUSCULAR | Status: AC
  Filled 2016-06-14: qty 10

## 2016-06-14 MED ORDER — PHENYLEPHRINE HCL 10 MG/ML IJ SOLN
INTRAVENOUS | Status: DC | PRN
  Administered 2016-06-14: 20 ug/min via INTRAVENOUS

## 2016-06-14 MED ORDER — MIDAZOLAM HCL 2 MG/2ML IJ SOLN
INTRAMUSCULAR | Status: AC
  Filled 2016-06-14: qty 2

## 2016-06-14 MED ORDER — MAGNESIUM SULFATE 2 GM/50ML IV SOLN
2.0000 g | Freq: Every day | INTRAVENOUS | Status: AC | PRN
  Administered 2016-06-14: 2 g via INTRAVENOUS
  Filled 2016-06-14: qty 50

## 2016-06-14 MED ORDER — DEXMEDETOMIDINE HCL IN NACL 400 MCG/100ML IV SOLN
INTRAVENOUS | Status: DC | PRN
  Administered 2016-06-14: .3 ug/kg/h via INTRAVENOUS

## 2016-06-14 MED ORDER — DEXTROSE 5 % IV SOLN
1.5000 g | Freq: Once | INTRAVENOUS | Status: DC
  Filled 2016-06-14: qty 1.5

## 2016-06-14 MED ORDER — LABETALOL HCL 5 MG/ML IV SOLN: 10.0000 mg | INTRAVENOUS | Status: DC | PRN

## 2016-06-14 MED ORDER — SUCCINYLCHOLINE CHLORIDE 200 MG/10ML IV SOSY
PREFILLED_SYRINGE | INTRAVENOUS | Status: AC
  Filled 2016-06-14: qty 10

## 2016-06-14 MED ORDER — PROPOFOL 10 MG/ML IV BOLUS
INTRAVENOUS | Status: AC
  Filled 2016-06-14: qty 20

## 2016-06-14 MED ORDER — ORAL CARE MOUTH RINSE
15.0000 mL | OROMUCOSAL | Status: DC
  Administered 2016-06-14 – 2016-06-15 (×6): 15 mL via OROMUCOSAL

## 2016-06-14 MED ORDER — GUAIFENESIN-DM 100-10 MG/5ML PO SYRP: 15.0000 mL | ORAL_SOLUTION | ORAL | Status: DC | PRN

## 2016-06-14 MED ORDER — CHLORHEXIDINE GLUCONATE 0.12% ORAL RINSE (MEDLINE KIT)
15.0000 mL | Freq: Two times a day (BID) | OROMUCOSAL | Status: DC
  Administered 2016-06-14 – 2016-06-15 (×2): 15 mL via OROMUCOSAL

## 2016-06-14 MED ORDER — ALUM & MAG HYDROXIDE-SIMETH 200-200-20 MG/5ML PO SUSP: 15.0000 mL | ORAL | Status: DC | PRN

## 2016-06-14 MED ORDER — SUGAMMADEX SODIUM 200 MG/2ML IV SOLN
INTRAVENOUS | Status: AC
  Filled 2016-06-14: qty 2

## 2016-06-14 MED ORDER — LACTATED RINGERS IV SOLN
INTRAVENOUS | Status: DC | PRN
  Administered 2016-06-14 (×2): via INTRAVENOUS

## 2016-06-14 MED ORDER — DEXTROSE 5 % IV SOLN
0.0000 ug/min | INTRAVENOUS | Status: DC
  Administered 2016-06-14: 50 ug/min via INTRAVENOUS
  Administered 2016-06-14: 85 ug/min via INTRAVENOUS
  Filled 2016-06-14 (×2): qty 2

## 2016-06-14 MED ORDER — OXYMETAZOLINE HCL 0.05 % NA SOLN
NASAL | Status: DC | PRN
  Administered 2016-06-14: 2 via NASAL

## 2016-06-14 MED ORDER — ALBUMIN HUMAN 5 % IV SOLN
INTRAVENOUS | Status: DC | PRN
  Administered 2016-06-14 (×2): via INTRAVENOUS

## 2016-06-14 MED ORDER — ROCURONIUM BROMIDE 10 MG/ML (PF) SYRINGE
PREFILLED_SYRINGE | INTRAVENOUS | Status: AC
  Filled 2016-06-14: qty 10

## 2016-06-14 MED ORDER — ONDANSETRON HCL 4 MG/2ML IJ SOLN
INTRAMUSCULAR | Status: AC
  Filled 2016-06-14: qty 2

## 2016-06-14 MED ORDER — POTASSIUM CHLORIDE CRYS ER 20 MEQ PO TBCR
20.0000 meq | EXTENDED_RELEASE_TABLET | Freq: Every day | ORAL | Status: DC | PRN
  Filled 2016-06-14: qty 2

## 2016-06-14 MED ORDER — TIOTROPIUM BROMIDE MONOHYDRATE 2.5 MCG/ACT IN AERS: 2.0000 | INHALATION_SPRAY | Freq: Every day | RESPIRATORY_TRACT | Status: DC

## 2016-06-14 MED ORDER — DEXTROSE 5 % IV SOLN
INTRAVENOUS | Status: AC
  Filled 2016-06-14: qty 1.5

## 2016-06-14 MED ORDER — PHENOL 1.4 % MT LIQD: 1.0000 | OROMUCOSAL | Status: DC | PRN

## 2016-06-14 MED ORDER — PROTAMINE SULFATE 10 MG/ML IV SOLN
INTRAVENOUS | Status: DC | PRN
  Administered 2016-06-14: 50 mg via INTRAVENOUS
  Administered 2016-06-14: 25 mg via INTRAVENOUS

## 2016-06-14 MED ORDER — DEXMEDETOMIDINE HCL IN NACL 200 MCG/50ML IV SOLN
0.4000 ug/kg/h | INTRAVENOUS | Status: DC
  Administered 2016-06-14 – 2016-06-15 (×4): 0.7 ug/kg/h via INTRAVENOUS
  Filled 2016-06-14 (×4): qty 50

## 2016-06-14 MED ORDER — ORAL CARE MOUTH RINSE: 15.0000 mL | Freq: Two times a day (BID) | OROMUCOSAL | Status: DC

## 2016-06-14 MED ORDER — FAMOTIDINE IN NACL 20-0.9 MG/50ML-% IV SOLN
20.0000 mg | Freq: Two times a day (BID) | INTRAVENOUS | Status: AC
  Administered 2016-06-14 – 2016-06-18 (×8): 20 mg via INTRAVENOUS
  Filled 2016-06-14 (×8): qty 50

## 2016-06-14 MED ORDER — SODIUM BICARBONATE 4.2 % IV SOLN
INTRAVENOUS | Status: DC | PRN
  Administered 2016-06-14: 25 meq via INTRAVENOUS

## 2016-06-14 MED ORDER — HEPARIN SODIUM (PORCINE) 1000 UNIT/ML IJ SOLN
INTRAMUSCULAR | Status: DC | PRN
  Administered 2016-06-14: 1000 [IU] via INTRAVENOUS
  Administered 2016-06-14: 6500 [IU] via INTRAVENOUS
  Administered 2016-06-14: 1000 [IU] via INTRAVENOUS

## 2016-06-14 MED ORDER — SERTRALINE HCL 50 MG PO TABS
50.0000 mg | ORAL_TABLET | Freq: Every day | ORAL | Status: DC
  Administered 2016-06-15 – 2016-06-20 (×6): 50 mg via NASOGASTRIC
  Filled 2016-06-14 (×6): qty 1

## 2016-06-14 MED ORDER — LACTATED RINGERS IV SOLN
INTRAVENOUS | Status: DC | PRN
  Administered 2016-06-14 (×3): via INTRAVENOUS

## 2016-06-14 MED ORDER — LEVALBUTEROL HCL 0.63 MG/3ML IN NEBU
0.6300 mg | INHALATION_SOLUTION | RESPIRATORY_TRACT | Status: DC | PRN
  Administered 2016-06-14: 0.63 mg via RESPIRATORY_TRACT
  Filled 2016-06-14 (×2): qty 3

## 2016-06-14 MED ORDER — HEMOSTATIC AGENTS (NO CHARGE) OPTIME
TOPICAL | Status: DC | PRN
  Administered 2016-06-14: 2 via TOPICAL

## 2016-06-14 MED ORDER — SODIUM CHLORIDE 0.9 % IV SOLN
500.0000 mL | Freq: Once | INTRAVENOUS | Status: AC | PRN
  Administered 2016-06-15: 500 mL via INTRAVENOUS

## 2016-06-14 MED ORDER — CEFUROXIME SODIUM 1.5 G IJ SOLR
1.5000 g | INTRAMUSCULAR | Status: AC
  Administered 2016-06-14 (×2): 1.5 g via INTRAVENOUS

## 2016-06-14 MED ORDER — KCL IN DEXTROSE-NACL 20-5-0.45 MEQ/L-%-% IV SOLN
INTRAVENOUS | Status: DC
  Administered 2016-06-14: 16:00:00 via INTRAVENOUS
  Administered 2016-06-15: 100 mL via INTRAVENOUS
  Filled 2016-06-14 (×3): qty 1000

## 2016-06-14 MED ORDER — LIDOCAINE 2% (20 MG/ML) 5 ML SYRINGE
INTRAMUSCULAR | Status: AC
  Filled 2016-06-14: qty 5

## 2016-06-14 MED ORDER — ACETAMINOPHEN 325 MG RE SUPP: 325.0000 mg | RECTAL | Status: DC | PRN

## 2016-06-14 MED ORDER — ACETAMINOPHEN 325 MG PO TABS
325.0000 mg | ORAL_TABLET | ORAL | Status: DC | PRN
  Administered 2016-06-16 – 2016-06-18 (×3): 650 mg via ORAL
  Filled 2016-06-14 (×3): qty 2

## 2016-06-14 MED ORDER — METOPROLOL TARTRATE 5 MG/5ML IV SOLN: 2.0000 mg | INTRAVENOUS | Status: DC | PRN

## 2016-06-14 MED ORDER — CHLORHEXIDINE GLUCONATE 0.12 % MT SOLN
15.0000 mL | Freq: Two times a day (BID) | OROMUCOSAL | Status: DC
  Administered 2016-06-14: 15 mL via OROMUCOSAL

## 2016-06-14 MED ORDER — SODIUM CHLORIDE 0.9 % IV SOLN
INTRAVENOUS | Status: DC
  Administered 2016-06-14: 13:00:00 via INTRAVENOUS

## 2016-06-14 MED ORDER — PHENYLEPHRINE HCL 10 MG/ML IJ SOLN
INTRAMUSCULAR | Status: DC | PRN
  Administered 2016-06-14: 40 ug via INTRAVENOUS
  Administered 2016-06-14: 80 ug via INTRAVENOUS
  Administered 2016-06-14: 40 ug via INTRAVENOUS

## 2016-06-14 MED ORDER — LACTATED RINGERS IV SOLN
INTRAVENOUS | Status: DC
  Administered 2016-06-15: 100 mL via INTRAVENOUS
  Administered 2016-06-15 – 2016-06-17 (×3): via INTRAVENOUS

## 2016-06-14 MED ORDER — ONDANSETRON HCL 4 MG/2ML IJ SOLN
INTRAMUSCULAR | Status: DC | PRN
Start: 2016-06-14 — End: 2016-06-14
  Administered 2016-06-14: 4 mg via INTRAVENOUS

## 2016-06-14 MED ORDER — MIDAZOLAM HCL 5 MG/5ML IJ SOLN
INTRAMUSCULAR | Status: DC | PRN
  Administered 2016-06-14 (×2): 1 mg via INTRAVENOUS
  Administered 2016-06-14: 2 mg via INTRAVENOUS
  Administered 2016-06-14 (×2): 1 mg via INTRAVENOUS

## 2016-06-14 MED ORDER — SUGAMMADEX SODIUM 200 MG/2ML IV SOLN
INTRAVENOUS | Status: DC | PRN
  Administered 2016-06-14: 150 mg via INTRAVENOUS
  Administered 2016-06-14: 140 mg via INTRAVENOUS

## 2016-06-14 MED ORDER — HEPARIN SODIUM (PORCINE) 1000 UNIT/ML IJ SOLN
INTRAMUSCULAR | Status: AC
  Filled 2016-06-14: qty 1

## 2016-06-14 MED ORDER — 0.9 % SODIUM CHLORIDE (POUR BTL) OPTIME
TOPICAL | Status: DC | PRN
  Administered 2016-06-14: 2000 mL

## 2016-06-14 MED ORDER — SODIUM CHLORIDE 0.9 % IV SOLN
INTRAVENOUS | Status: DC | PRN
  Administered 2016-06-14: 500 mL

## 2016-06-14 MED ORDER — ARTIFICIAL TEARS OP OINT
TOPICAL_OINTMENT | OPHTHALMIC | Status: AC
  Filled 2016-06-14: qty 3.5

## 2016-06-14 MED ORDER — ROCURONIUM BROMIDE 100 MG/10ML IV SOLN
INTRAVENOUS | Status: DC | PRN
  Administered 2016-06-14: 10 mg via INTRAVENOUS
  Administered 2016-06-14: 50 mg via INTRAVENOUS
  Administered 2016-06-14: 10 mg via INTRAVENOUS
  Administered 2016-06-14: 50 mg via INTRAVENOUS
  Administered 2016-06-14: 30 mg via INTRAVENOUS
  Administered 2016-06-14: 20 mg via INTRAVENOUS
  Administered 2016-06-14: 50 mg via INTRAVENOUS

## 2016-06-14 MED ORDER — TIOTROPIUM BROMIDE MONOHYDRATE 18 MCG IN CAPS
18.0000 ug | ORAL_CAPSULE | Freq: Every day | RESPIRATORY_TRACT | Status: DC
  Filled 2016-06-14: qty 5

## 2016-06-14 MED ORDER — CALCIUM CHLORIDE 10 % IV SOLN
INTRAVENOUS | Status: AC
  Filled 2016-06-14: qty 10

## 2016-06-14 MED ORDER — EPHEDRINE 5 MG/ML INJ
INTRAVENOUS | Status: AC
  Filled 2016-06-14: qty 10

## 2016-06-14 MED ORDER — OXYMETAZOLINE HCL 0.05 % NA SOLN
NASAL | Status: AC
  Filled 2016-06-14: qty 15

## 2016-06-14 MED ORDER — ONDANSETRON HCL 4 MG/2ML IJ SOLN: 4.0000 mg | Freq: Four times a day (QID) | INTRAMUSCULAR | Status: DC | PRN

## 2016-06-14 MED ORDER — MIDAZOLAM HCL 5 MG/5ML IJ SOLN: INTRAMUSCULAR | Status: DC | PRN

## 2016-06-14 MED ORDER — SUFENTANIL CITRATE 50 MCG/ML IV SOLN
INTRAVENOUS | Status: DC | PRN
  Administered 2016-06-14: 5 ug via INTRAVENOUS
  Administered 2016-06-14: 20 ug via INTRAVENOUS
  Administered 2016-06-14: 5 ug via INTRAVENOUS
  Administered 2016-06-14: 10 ug via INTRAVENOUS
  Administered 2016-06-14: 15 ug via INTRAVENOUS
  Administered 2016-06-14 (×2): 10 ug via INTRAVENOUS
  Administered 2016-06-14: 5 ug via INTRAVENOUS
  Administered 2016-06-14: 15 ug via INTRAVENOUS
  Administered 2016-06-14 (×2): 10 ug via INTRAVENOUS
  Administered 2016-06-14 (×2): 5 ug via INTRAVENOUS
  Administered 2016-06-14: 10 ug via INTRAVENOUS

## 2016-06-14 MED ORDER — DEXTROSE 5 % IV SOLN
1.5000 g | Freq: Two times a day (BID) | INTRAVENOUS | Status: AC
  Administered 2016-06-14 – 2016-06-15 (×2): 1.5 g via INTRAVENOUS
  Filled 2016-06-14 (×3): qty 1.5

## 2016-06-14 MED FILL — Sodium Chloride IV Soln 0.9%: INTRAVENOUS | Qty: 3000 | Status: AC

## 2016-06-14 MED FILL — Heparin Sodium (Porcine) Inj 1000 Unit/ML: INTRAMUSCULAR | Qty: 30 | Status: AC

## 2016-06-14 SURGICAL SUPPLY — 65 items
CANISTER SUCTION 2500CC (MISCELLANEOUS) ×2 IMPLANT
CLIP TI MEDIUM 24 (CLIP) ×2 IMPLANT
CLIP TI WIDE RED SMALL 24 (CLIP) ×2 IMPLANT
COUNTER NEEDLE 20 DBL MAG RED (NEEDLE) ×2 IMPLANT
COVER MAYO STAND STRL (DRAPES) ×2 IMPLANT
COVER SURGICAL LIGHT HANDLE (MISCELLANEOUS) ×2 IMPLANT
DERMABOND ADVANCED (GAUZE/BANDAGES/DRESSINGS) ×4
DERMABOND ADVANCED .7 DNX12 (GAUZE/BANDAGES/DRESSINGS) ×4 IMPLANT
ELECT BLADE 4.0 EZ CLEAN MEGAD (MISCELLANEOUS) ×2
ELECT BLADE 6.5 EXT (BLADE) IMPLANT
ELECT CAUTERY BLADE 6.4 (BLADE) ×2 IMPLANT
ELECT REM PT RETURN 9FT ADLT (ELECTROSURGICAL) ×2
ELECTRODE BLDE 4.0 EZ CLN MEGD (MISCELLANEOUS) ×1 IMPLANT
ELECTRODE REM PT RTRN 9FT ADLT (ELECTROSURGICAL) ×1 IMPLANT
FELT TEFLON 1X6 (MISCELLANEOUS) ×2 IMPLANT
GLOVE BIO SURGEON STRL SZ 6.5 (GLOVE) ×2 IMPLANT
GLOVE BIOGEL PI IND STRL 6.5 (GLOVE) ×2 IMPLANT
GLOVE BIOGEL PI IND STRL 7.5 (GLOVE) ×1 IMPLANT
GLOVE BIOGEL PI INDICATOR 6.5 (GLOVE) ×2
GLOVE BIOGEL PI INDICATOR 7.5 (GLOVE) ×1
GLOVE INDICATOR 7.5 STRL GRN (GLOVE) ×4 IMPLANT
GLOVE SURG SS PI 7.5 STRL IVOR (GLOVE) ×2 IMPLANT
GOWN STRL REUS W/ TWL LRG LVL3 (GOWN DISPOSABLE) ×4 IMPLANT
GOWN STRL REUS W/ TWL XL LVL3 (GOWN DISPOSABLE) ×2 IMPLANT
GOWN STRL REUS W/TWL LRG LVL3 (GOWN DISPOSABLE) ×4
GOWN STRL REUS W/TWL XL LVL3 (GOWN DISPOSABLE) ×2
GRAFT HEMASHIELD 14X7MM (Vascular Products) ×2 IMPLANT
GRAFT VASC 18MMX15CM (Graft) ×2 IMPLANT
HEMOSTAT SNOW SURGICEL 2X4 (HEMOSTASIS) ×4 IMPLANT
INSERT FOGARTY 61MM (MISCELLANEOUS) ×2 IMPLANT
INSERT FOGARTY SM (MISCELLANEOUS) ×8 IMPLANT
INSERT FOGARTY XLG (MISCELLANEOUS) ×2 IMPLANT
KIT BASIN OR (CUSTOM PROCEDURE TRAY) ×2 IMPLANT
KIT ROOM TURNOVER OR (KITS) ×2 IMPLANT
LOOP VESSEL MINI RED (MISCELLANEOUS) ×4 IMPLANT
NS IRRIG 1000ML POUR BTL (IV SOLUTION) ×4 IMPLANT
PACK AORTA (CUSTOM PROCEDURE TRAY) ×2 IMPLANT
PAD ARMBOARD 7.5X6 YLW CONV (MISCELLANEOUS) ×4 IMPLANT
PENCIL BUTTON HOLSTER BLD 10FT (ELECTRODE) ×2 IMPLANT
PUNCH AORTIC ROTATE 5MM 8IN (MISCELLANEOUS) ×2 IMPLANT
SUT ETHIBOND 5 LR DA (SUTURE) ×4 IMPLANT
SUT PDS AB 1 CTX 36 (SUTURE) ×2 IMPLANT
SUT PDS AB 1 TP1 54 (SUTURE) ×4 IMPLANT
SUT PROLENE 0 CTX CR/8 (SUTURE) ×2 IMPLANT
SUT PROLENE 3 0 SH 48 (SUTURE) ×10 IMPLANT
SUT PROLENE 5 0 C 1 24 (SUTURE) ×12 IMPLANT
SUT PROLENE 5 0 C 1 36 (SUTURE) ×6 IMPLANT
SUT PROLENE 6 0 C 1 30 (SUTURE) ×4 IMPLANT
SUT SILK 2 0 (SUTURE) ×1
SUT SILK 2 0 TIES 17X18 (SUTURE) ×1
SUT SILK 2 0SH CR/8 30 (SUTURE) ×2 IMPLANT
SUT SILK 2-0 18XBRD TIE 12 (SUTURE) ×1 IMPLANT
SUT SILK 2-0 18XBRD TIE BLK (SUTURE) ×1 IMPLANT
SUT SILK 3 0 (SUTURE) ×1
SUT SILK 3 0 TIES 17X18 (SUTURE) ×1
SUT SILK 3-0 18XBRD TIE 12 (SUTURE) ×1 IMPLANT
SUT SILK 3-0 18XBRD TIE BLK (SUTURE) ×1 IMPLANT
SUT VIC AB 2-0 CT1 27 (SUTURE) ×5
SUT VIC AB 2-0 CT1 TAPERPNT 27 (SUTURE) ×5 IMPLANT
SUT VIC AB 3-0 SH 27 (SUTURE) ×2
SUT VIC AB 3-0 SH 27X BRD (SUTURE) ×2 IMPLANT
SUT VICRYL 4-0 PS2 18IN ABS (SUTURE) ×8 IMPLANT
TOWEL BLUE STERILE X RAY DET (MISCELLANEOUS) ×4 IMPLANT
TRAY FOLEY W/METER SILVER 16FR (SET/KITS/TRAYS/PACK) ×2 IMPLANT
WATER STERILE IRR 1000ML POUR (IV SOLUTION) ×4 IMPLANT

## 2016-06-14 NOTE — Anesthesia Procedure Notes (Addendum)
Central Venous Catheter Insertion Performed by: anesthesiologist Preanesthetic checklist: patient identified, site marked, risks and benefits discussed, surgical consent, monitors and equipment checked, pre-op evaluation and timeout performed Position: Trendelenburg Catheter size: 9.5 Fr Central line was placed.Double lumen Procedure performed using ultrasound guided technique. Attempts: 1 Following insertion, line sutured, dressing applied and Biopatch. Post procedure assessment: blood return through all ports.

## 2016-06-14 NOTE — Anesthesia Procedure Notes (Signed)
Procedure Name: Intubation Date/Time: 06/14/2016 8:46 AM Performed by: Izora Gala Pre-anesthesia Checklist: Patient identified, Emergency Drugs available, Suction available and Patient being monitored Patient Re-evaluated:Patient Re-evaluated prior to inductionOxygen Delivery Method: Circle system utilized Preoxygenation: Pre-oxygenation with 100% oxygen Intubation Type: IV induction Ventilation: Mask ventilation without difficulty Laryngoscope Size: Miller and 3 Grade View: Grade II Tube type: Oral Tube size: 7.5 mm Number of attempts: 1 Airway Equipment and Method: Stylet Placement Confirmation: ETT inserted through vocal cords under direct vision,  positive ETCO2 and breath sounds checked- equal and bilateral Secured at: 21 cm Dental Injury: Teeth and Oropharynx as per pre-operative assessment

## 2016-06-14 NOTE — Progress Notes (Signed)
[facility] Progress Note Patient Name: Kayla Sawyer DOB: 04/28/1946 MRN: [id number]   Date of Service  06/14/2016  HPI/Events of Note  Pt returned from OR, vascular surgery, kept on vent.   eICU Interventions  Discussed with RT, vent changes made, ordered AM CXR and ABG.      Intervention Category Major Interventions: Acid-Base disturbance - evaluation and management  Laverle Hobby 06/14/2016, 4:43 PM

## 2016-06-14 NOTE — Transfer of Care (Signed)
Immediate Anesthesia Transfer of Care Note  Patient: Kayla Sawyer  Procedure(s) Performed: Procedure(s): AORTOBIFEMORAL BYPASS GRAFT AORTA SMA BYPASS GRAFT RE-IMPLANTATION  OF SUPERIOR MESENTERIC ARTERY (Bilateral)  Patient Location: PACU  Anesthesia Type:General  Level of Consciousness: Patient remains intubated per anesthesia plan  Airway & Oxygen Therapy: Patient remains intubated per anesthesia plan and Patient placed on Ventilator (see vital sign flow sheet for setting)  Post-op Assessment: Report given to RN and Post -op Vital signs reviewed and stable  Post vital signs: Reviewed and stable  Last Vitals:  Vitals:   06/14/16 0648 06/14/16 1600  BP: (!) 109/52 (!) 100/52  Pulse:  83  Resp:  (!) 21  Temp:  37.3 C    Last Pain:  Vitals:   06/14/16 1600  TempSrc: Oral  PainSc:       Patients Stated Pain Goal: 3 (76/15/18 3437)  Complications: No apparent anesthesia complications

## 2016-06-14 NOTE — H&P (View-Only) (Signed)
[facility]ist of [place]  Patient name: Kayla Sawyer MRN: [id number] DOB: [birth year] Sex: female  REASON FOR VISIT: Pre-op  HPI: Kayla Sawyer is a 70 y.o. female, who is referred today for evaluation of bilateral claudication.  She states that she has significant issues with ambulation.  She can hardly get around her house.  She denies having nonhealing wounds.  She has recently undergone catheterization which shows left iliac occlusion and significant calcific stenosis within her infrarenal aorta.  This is also confirmed on CT scan.  She is referred for aortobifemoral bypass graft.  The patient has a 50+-pack-year history of smoking and has COPD.  She is followed by pulmonary and is also suspected to have scleroderma.  She recently had cardiac catheterization which showed mild obstructive disease and no pulmonary hypertension.  She suffers from hypercholesterolemia which is managed with a statin.  She is on a baby aspirin for antiplatelet therapy.  She is here today to discuss the pulmonary risk factors for surgery which is scheduled for this Wednesday  Past Medical History:  Diagnosis Date  . Arthritis   . COPD (chronic obstructive pulmonary disease) ([place])   . Depression   . E. coli pyelonephritis 6//2014  . GERD (gastroesophageal reflux disease)   . Pneumonia   . Pulmonary hypertension   . Sepsis ([place])     Family History  Problem Relation Age of Onset  . Diabetes Mother   . Emphysema Father     SOCIAL HISTORY: Social History  Substance Use Topics  . Smoking status: Current Every Day Smoker    Packs/day: 1.00    Years: 50.00    Types: Cigarettes  . Smokeless tobacco: Never Used     Comment: currently smokes 0.5 pack/day 06/06/16  . Alcohol use No    Allergies  Allergen Reactions  . Sulfur Nausea Only    Current Outpatient Prescriptions  Medication Sig Dispense Refill  . acetaminophen (TYLENOL) 500 MG tablet  Take 500 mg by mouth every 6 (six) hours as needed for mild pain.    Marland Kitchen aspirin EC 81 MG tablet Take 1 tablet (81 mg total) by mouth daily.    Marland Kitchen atorvastatin (LIPITOR) 20 MG tablet Take 1 tablet (20 mg total) by mouth daily. 90 tablet 3  . CHANTIX 1 MG tablet Take 1 mg by mouth 2 (two) times daily.    . cholecalciferol (VITAMIN D) 1000 units tablet Take 1,000 Units by mouth daily.    . Multiple Vitamin (MULTIVITAMIN WITH MINERALS) TABS tablet Take 1 tablet by mouth daily.    Marland Kitchen omeprazole (PRILOSEC) 20 MG capsule Take 20 mg by mouth daily.     . sertraline (ZOLOFT) 50 MG tablet Take 50 mg by mouth daily.    . Tiotropium Bromide Monohydrate (SPIRIVA RESPIMAT) 2.5 MCG/ACT AERS Inhale 2 puffs into the lungs daily. 3 Inhaler 4   No current facility-administered medications for this visit.     REVIEW OF SYSTEMS:  '[X]'$  denotes positive finding, '[ ]'$  denotes negative finding Cardiac  Comments:  Chest pain or chest pressure:    Shortness of breath upon exertion:    Short of breath when lying flat:    Irregular heart rhythm:        Vascular    Pain in calf, thigh, or hip brought on by ambulation: x   Pain in feet at night that wakes you up from your sleep:     Blood clot in your veins:  Leg swelling:         Pulmonary    Oxygen at home:    Productive cough:     Wheezing:         Neurologic    Sudden weakness in arms or legs:     Sudden numbness in arms or legs:     Sudden onset of difficulty speaking or slurred speech:    Temporary loss of vision in one eye:     Problems with dizziness:         Gastrointestinal    Blood in stool:     Vomited blood:         Genitourinary    Burning when urinating:     Blood in urine:        Psychiatric    Major depression:         Hematologic    Bleeding problems:    Problems with blood clotting too easily:        Skin    Rashes or ulcers:        Constitutional    Fever or chills:      PHYSICAL EXAM: Vitals:   06/12/16 1436  BP:  119/71  Pulse: 83  Resp: 18  Temp: 97.9 F (36.6 C)  TempSrc: Oral  SpO2: 93%  Weight: 142 lb (64.4 kg)  Height: '5\' 8"'$  (1.727 m)    GENERAL: The patient is a well-nourished female, in no acute distress. The vital signs are documented above. CARDIAC: There is a regular rate and rhythm.  PULMONARY: Nonlabored respirations ABDOMEN: Soft and non-tender  MUSCULOSKELETAL: There are no major deformities or cyanosis. NEUROLOGIC: No focal weakness or paresthesias are detected. SKIN: There are no ulcers or rashes noted. PSYCHIATRIC: The patient has a normal affect.  DATA:  None  MEDICAL ISSUES: The patient is scheduled for an aortobifemoral bypass graft with possible superior mesenteric artery bypass this Wednesday, November 29.  I brought her into discussed the pulmonary risk factors after pulmonary referral.  We discussed today her risk of pneumonia and prolonged intubation and possible tracheotomy.  She understands these risks and wishes to proceed.    Annamarie Major, MD [facility]ists of [facility] [phone] Pager [phone]

## 2016-06-14 NOTE — Interval H&P Note (Signed)
History and Physical Interval Note:  06/14/2016 7:45 AM  Kayla Sawyer  has presented today for surgery, with the diagnosis of Peripheral vascular disease with bilateral lower extremity claudication I70.213  The various methods of treatment have been discussed with the patient and family. After consideration of risks, benefits and other options for treatment, the patient has consented to  Procedure(s): AORTOBIFEMORAL BYPASS GRAFT (Bilateral) as a surgical intervention .  The patient's history has been reviewed, patient examined, no change in status, stable for surgery.  I have reviewed the patient's chart and labs.  Questions were answered to the patient's satisfaction.     Annamarie Major

## 2016-06-14 NOTE — Anesthesia Preprocedure Evaluation (Addendum)
Anesthesia Evaluation  Patient identified by MRN, date of birth, ID band Patient awake    Reviewed: Allergy & Precautions, NPO status , Patient's Chart, lab work & pertinent test results  History of Anesthesia Complications Negative for: history of anesthetic complications  Airway Mallampati: II   Neck ROM: Full  Mouth opening: Limited Mouth Opening  Dental  (+) Edentulous Upper   Pulmonary shortness of breath, COPD, Current Smoker,    + rhonchi        Cardiovascular + CAD, + Peripheral Vascular Disease and + DOE   Rhythm:Regular Rate:Normal  Mild CAD   Neuro/Psych    GI/Hepatic GERD  ,  Endo/Other    Renal/GU Renal InsufficiencyRenal disease     Musculoskeletal   Abdominal   Peds  Hematology negative hematology ROS (+)   Anesthesia Other Findings   Reproductive/Obstetrics                            Anesthesia Physical Anesthesia Plan  ASA: III  Anesthesia Plan: General   Post-op Pain Management:    Induction: Intravenous  Airway Management Planned: Oral ETT  Additional Equipment: Arterial line and CVP  Intra-op Plan: Utilization Of Total Body Hypothermia per surgeon request  Post-operative Plan: Possible Post-op intubation/ventilation  Informed Consent:   Plan Discussed with:   Anesthesia Plan Comments:         Anesthesia Quick Evaluation

## 2016-06-14 NOTE — Brief Op Note (Signed)
06/14/2016  6:27 PM  PATIENT:  Kayla Sawyer  70 y.o. female  PRE-OPERATIVE DIAGNOSIS:   Aortic Stenosis with bilateral claudification  POST-OPERATIVE DIAGNOSIS:  Aortic Stenosis with bilateral claudification  PROCEDURE:  Procedure(s): AORTOBIFEMORAL BYPASS GRAFT AORTA SMA BYPASS GRAFT RE-IMPLANTATION  OF SUPERIOR MESENTERIC ARTERY (Bilateral)  SURGEON:  Surgeon(s) and Role:    Serafina Mitchell, MD - Primary  PHYSICIAN ASSISTANT:   ASSISTANTS: S> Rhyne, Wells Guiles   ANESTHESIA:   general  EBL:  Total I/O In: 5710.1 [I.V.:3740.1; Blood:1420; IV Piggyback:550] Out: 1610 [Urine:740; Blood:950]  BLOOD ADMINISTERED:500 CC PRBC  DRAINS: none   LOCAL MEDICATIONS USED:  NONE  SPECIMEN:  No Specimen  DISPOSITION OF SPECIMEN:  N/A  COUNTS:  YES  TOURNIQUET:  * No tourniquets in log *  DICTATION: .Dragon Dictation  PLAN OF CARE: Admit to inpatient   PATIENT DISPOSITION:  ICU - intubated and hemodynamically stable.   Delay start of Pharmacological VTE agent (>24hrs) due to surgical blood loss or risk of bleeding: yes

## 2016-06-15 ENCOUNTER — Inpatient Hospital Stay (HOSPITAL_COMMUNITY): Payer: Medicare Other

## 2016-06-15 ENCOUNTER — Encounter (HOSPITAL_COMMUNITY): Payer: Self-pay | Admitting: Surgery

## 2016-06-15 ENCOUNTER — Other Ambulatory Visit (HOSPITAL_COMMUNITY): Payer: Medicare Other

## 2016-06-15 DIAGNOSIS — N179 Acute kidney failure, unspecified: Secondary | ICD-10-CM

## 2016-06-15 DIAGNOSIS — J9611 Chronic respiratory failure with hypoxia: Secondary | ICD-10-CM

## 2016-06-15 DIAGNOSIS — J449 Chronic obstructive pulmonary disease, unspecified: Secondary | ICD-10-CM

## 2016-06-15 DIAGNOSIS — J849 Interstitial pulmonary disease, unspecified: Secondary | ICD-10-CM

## 2016-06-15 DIAGNOSIS — Z95828 Presence of other vascular implants and grafts: Secondary | ICD-10-CM

## 2016-06-15 DIAGNOSIS — J9601 Acute respiratory failure with hypoxia: Secondary | ICD-10-CM

## 2016-06-15 DIAGNOSIS — I9589 Other hypotension: Secondary | ICD-10-CM

## 2016-06-15 LAB — POCT I-STAT 3, VENOUS BLOOD GAS (G3P V)
Acid-base deficit: 4 mmol/L — ABNORMAL HIGH (ref 0.0–2.0)
Bicarbonate: 21.4 mmol/L (ref 20.0–28.0)
O2 Saturation: 96 %
TCO2: 23 mmol/L (ref 0–100)
pCO2, Ven: 39.3 mmHg — ABNORMAL LOW (ref 44.0–60.0)
pH, Ven: 7.345 (ref 7.250–7.430)
pO2, Ven: 84 mmHg — ABNORMAL HIGH (ref 32.0–45.0)

## 2016-06-15 LAB — LACTIC ACID, PLASMA
Lactic Acid, Venous: 1.9 mmol/L (ref 0.5–1.9)
Lactic Acid, Venous: 1.4 mmol/L (ref 0.5–1.9)
Lactic Acid, Venous: 1.3 mmol/L (ref 0.5–1.9)

## 2016-06-15 LAB — COMPREHENSIVE METABOLIC PANEL
ALT: 14 U/L (ref 14–54)
AST: 29 U/L (ref 15–41)
Albumin: 2.4 g/dL — ABNORMAL LOW (ref 3.5–5.0)
Alkaline Phosphatase: 24 U/L — ABNORMAL LOW (ref 38–126)
Anion gap: 7 (ref 5–15)
BUN: 25 mg/dL — ABNORMAL HIGH (ref 6–20)
CO2: 19 mmol/L — ABNORMAL LOW (ref 22–32)
Calcium: 8.3 mg/dL — ABNORMAL LOW (ref 8.9–10.3)
Chloride: 109 mmol/L (ref 101–111)
Creatinine, Ser: 1.38 mg/dL — ABNORMAL HIGH (ref 0.44–1.00)
GFR calc Af Amer: 44 mL/min — ABNORMAL LOW (ref >60)
GFR calc non Af Amer: 38 mL/min — ABNORMAL LOW (ref >60)
Glucose, Bld: 304 mg/dL — ABNORMAL HIGH (ref 65–99)
Potassium: 4.4 mmol/L (ref 3.5–5.1)
Sodium: 135 mmol/L (ref 135–145)
Total Bilirubin: 0.5 mg/dL (ref 0.3–1.2)
Total Protein: 3.9 g/dL — ABNORMAL LOW (ref 6.5–8.1)

## 2016-06-15 LAB — BASIC METABOLIC PANEL
Anion gap: 8 (ref 5–15)
BUN: 28 mg/dL — ABNORMAL HIGH (ref 6–20)
CO2: 20 mmol/L — ABNORMAL LOW (ref 22–32)
Calcium: 8.1 mg/dL — ABNORMAL LOW (ref 8.9–10.3)
Chloride: 107 mmol/L (ref 101–111)
Creatinine, Ser: 1.26 mg/dL — ABNORMAL HIGH (ref 0.44–1.00)
GFR calc Af Amer: 49 mL/min — ABNORMAL LOW (ref >60)
GFR calc non Af Amer: 42 mL/min — ABNORMAL LOW (ref >60)
Glucose, Bld: 134 mg/dL — ABNORMAL HIGH (ref 65–99)
Potassium: 3.5 mmol/L (ref 3.5–5.1)
Sodium: 135 mmol/L (ref 135–145)

## 2016-06-15 LAB — MAGNESIUM: Magnesium: 1.7 mg/dL (ref 1.7–2.4)

## 2016-06-15 LAB — POCT I-STAT 7, (LYTES, BLD GAS, ICA,H+H)
Acid-Base Excess: 1 mmol/L (ref 0.0–2.0)
Acid-base deficit: 1 mmol/L (ref 0.0–2.0)
Acid-base deficit: 3 mmol/L — ABNORMAL HIGH (ref 0.0–2.0)
Bicarbonate: 25.9 mmol/L (ref 20.0–28.0)
Bicarbonate: 25.7 mmol/L (ref 20.0–28.0)
Bicarbonate: 23.1 mmol/L (ref 20.0–28.0)
Bicarbonate: 25.6 mmol/L (ref 20.0–28.0)
Calcium, Ion: 1.18 mmol/L (ref 1.15–1.40)
Calcium, Ion: 1.12 mmol/L — ABNORMAL LOW (ref 1.15–1.40)
Calcium, Ion: 1.06 mmol/L — ABNORMAL LOW (ref 1.15–1.40)
Calcium, Ion: 1.05 mmol/L — ABNORMAL LOW (ref 1.15–1.40)
HCT: 40 % (ref 36.0–46.0)
HCT: 34 % — ABNORMAL LOW (ref 36.0–46.0)
HCT: 34 % — ABNORMAL LOW (ref 36.0–46.0)
HCT: 33 % — ABNORMAL LOW (ref 36.0–46.0)
Hemoglobin: 13.6 g/dL (ref 12.0–15.0)
Hemoglobin: 11.6 g/dL — ABNORMAL LOW (ref 12.0–15.0)
Hemoglobin: 11.6 g/dL — ABNORMAL LOW (ref 12.0–15.0)
Hemoglobin: 11.2 g/dL — ABNORMAL LOW (ref 12.0–15.0)
O2 Saturation: 100 %
O2 Saturation: 100 %
O2 Saturation: 100 %
O2 Saturation: 100 %
Patient temperature: 36.3
Potassium: 3.8 mmol/L (ref 3.5–5.1)
Potassium: 4 mmol/L (ref 3.5–5.1)
Potassium: 4.5 mmol/L (ref 3.5–5.1)
Potassium: 4.1 mmol/L (ref 3.5–5.1)
Sodium: 141 mmol/L (ref 135–145)
Sodium: 142 mmol/L (ref 135–145)
Sodium: 142 mmol/L (ref 135–145)
Sodium: 143 mmol/L (ref 135–145)
TCO2: 27 mmol/L (ref 0–100)
TCO2: 27 mmol/L (ref 0–100)
TCO2: 24 mmol/L (ref 0–100)
TCO2: 27 mmol/L (ref 0–100)
pCO2 arterial: 43.6 mmHg (ref 32.0–48.0)
pCO2 arterial: 49.8 mmHg — ABNORMAL HIGH (ref 32.0–48.0)
pCO2 arterial: 45.5 mmHg (ref 32.0–48.0)
pCO2 arterial: 40.3 mmHg (ref 32.0–48.0)
pH, Arterial: 7.378 (ref 7.350–7.450)
pH, Arterial: 7.321 — ABNORMAL LOW (ref 7.350–7.450)
pH, Arterial: 7.314 — ABNORMAL LOW (ref 7.350–7.450)
pH, Arterial: 7.41 (ref 7.350–7.450)
pO2, Arterial: 183 mmHg — ABNORMAL HIGH (ref 83.0–108.0)
pO2, Arterial: 267 mmHg — ABNORMAL HIGH (ref 83.0–108.0)
pO2, Arterial: 210 mmHg — ABNORMAL HIGH (ref 83.0–108.0)
pO2, Arterial: 178 mmHg — ABNORMAL HIGH (ref 83.0–108.0)

## 2016-06-15 LAB — GLUCOSE, CAPILLARY
Glucose-Capillary: 206 mg/dL — ABNORMAL HIGH (ref 65–99)
Glucose-Capillary: 143 mg/dL — ABNORMAL HIGH (ref 65–99)
Glucose-Capillary: 120 mg/dL — ABNORMAL HIGH (ref 65–99)
Glucose-Capillary: 121 mg/dL — ABNORMAL HIGH (ref 65–99)

## 2016-06-15 LAB — POCT I-STAT 3, ART BLOOD GAS (G3+)
Acid-base deficit: 8 mmol/L — ABNORMAL HIGH (ref 0.0–2.0)
Bicarbonate: 18 mmol/L — ABNORMAL LOW (ref 20.0–28.0)
O2 Saturation: 95 %
Patient temperature: 99.5
TCO2: 19 mmol/L (ref 0–100)
pCO2 arterial: 38.2 mmHg (ref 32.0–48.0)
pH, Arterial: 7.285 — ABNORMAL LOW (ref 7.350–7.450)
pO2, Arterial: 87 mmHg (ref 83.0–108.0)

## 2016-06-15 LAB — POCT I-STAT 4, (NA,K, GLUC, HGB,HCT)
Glucose, Bld: 112 mg/dL — ABNORMAL HIGH (ref 65–99)
HCT: 37 % (ref 36.0–46.0)
Hemoglobin: 12.6 g/dL (ref 12.0–15.0)
Potassium: 3.8 mmol/L (ref 3.5–5.1)
Sodium: 142 mmol/L (ref 135–145)

## 2016-06-15 LAB — CBC
HCT: 31.9 % — ABNORMAL LOW (ref 36.0–46.0)
Hemoglobin: 11 g/dL — ABNORMAL LOW (ref 12.0–15.0)
MCH: 30.6 pg (ref 26.0–34.0)
MCHC: 34.5 g/dL (ref 30.0–36.0)
MCV: 88.6 fL (ref 78.0–100.0)
Platelets: 115 10*3/uL — ABNORMAL LOW (ref 150–400)
RBC: 3.6 MIL/uL — ABNORMAL LOW (ref 3.87–5.11)
RDW: 15.6 % — ABNORMAL HIGH (ref 11.5–15.5)
WBC: 16.8 10*3/uL — ABNORMAL HIGH (ref 4.0–10.5)

## 2016-06-15 LAB — CORTISOL: Cortisol, Plasma: 33.1 ug/dL

## 2016-06-15 LAB — HEMOGLOBIN A1C
Hgb A1c MFr Bld: 5.5 % (ref 4.8–5.6)
Mean Plasma Glucose: 111 mg/dL

## 2016-06-15 LAB — AMYLASE: Amylase: 178 U/L — ABNORMAL HIGH (ref 28–100)

## 2016-06-15 LAB — TROPONIN I
Troponin I: <0.03 ng/mL (ref <0.03)
Troponin I: <0.03 ng/mL (ref <0.03)
Troponin I: <0.03 ng/mL (ref <0.03)

## 2016-06-15 LAB — POCT ACTIVATED CLOTTING TIME: Activated Clotting Time: 169 seconds

## 2016-06-15 MED ORDER — LACTATED RINGERS IV BOLUS (SEPSIS)
500.0000 mL | Freq: Once | INTRAVENOUS | Status: AC
  Administered 2016-06-15: 500 mL via INTRAVENOUS

## 2016-06-15 MED ORDER — ORAL CARE MOUTH RINSE
15.0000 mL | OROMUCOSAL | Status: DC
  Administered 2016-06-15 – 2016-06-18 (×15): 15 mL via OROMUCOSAL

## 2016-06-15 MED ORDER — PHENYLEPHRINE HCL 10 MG/ML IJ SOLN
0.0000 ug/min | INTRAMUSCULAR | Status: DC
  Administered 2016-06-15: 70 ug/min via INTRAVENOUS
  Administered 2016-06-15: 85 ug/min via INTRAVENOUS
  Filled 2016-06-15 (×3): qty 4

## 2016-06-15 MED ORDER — LEVALBUTEROL HCL 1.25 MG/0.5ML IN NEBU: 1.2500 mg | INHALATION_SOLUTION | Freq: Four times a day (QID) | RESPIRATORY_TRACT | Status: DC

## 2016-06-15 MED ORDER — IPRATROPIUM BROMIDE 0.02 % IN SOLN
0.5000 mg | Freq: Four times a day (QID) | RESPIRATORY_TRACT | Status: DC
  Administered 2016-06-15 – 2016-06-16 (×5): 0.5 mg via RESPIRATORY_TRACT
  Filled 2016-06-15 (×5): qty 2.5

## 2016-06-15 MED ORDER — FENTANYL CITRATE (PF) 100 MCG/2ML IJ SOLN
25.0000 ug | INTRAMUSCULAR | Status: DC | PRN
  Administered 2016-06-15 – 2016-06-16 (×11): 50 ug via INTRAVENOUS
  Administered 2016-06-17: 25 ug via INTRAVENOUS
  Administered 2016-06-17 – 2016-06-18 (×6): 50 ug via INTRAVENOUS
  Administered 2016-06-19: 25 ug via INTRAVENOUS
  Administered 2016-06-19: 50 ug via INTRAVENOUS
  Filled 2016-06-15 (×20): qty 2

## 2016-06-15 MED ORDER — ENOXAPARIN SODIUM 30 MG/0.3ML ~~LOC~~ SOLN
30.0000 mg | Freq: Two times a day (BID) | SUBCUTANEOUS | Status: DC
  Administered 2016-06-15 – 2016-06-20 (×10): 30 mg via SUBCUTANEOUS
  Filled 2016-06-15 (×9): qty 0.3

## 2016-06-15 MED ORDER — LEVALBUTEROL HCL 1.25 MG/0.5ML IN NEBU
1.2500 mg | INHALATION_SOLUTION | Freq: Four times a day (QID) | RESPIRATORY_TRACT | Status: DC
  Administered 2016-06-15 – 2016-06-16 (×5): 1.25 mg via RESPIRATORY_TRACT
  Filled 2016-06-15 (×4): qty 0.5

## 2016-06-15 MED ORDER — INSULIN ASPART 100 UNIT/ML ~~LOC~~ SOLN
0.0000 [IU] | SUBCUTANEOUS | Status: DC
  Administered 2016-06-15 (×3): 1 [IU] via SUBCUTANEOUS
  Administered 2016-06-15: 3 [IU] via SUBCUTANEOUS
  Administered 2016-06-16: 1 [IU] via SUBCUTANEOUS
  Administered 2016-06-16: 2 [IU] via SUBCUTANEOUS

## 2016-06-15 MED ORDER — LACTATED RINGERS IV BOLUS (SEPSIS)
1000.0000 mL | Freq: Once | INTRAVENOUS | Status: AC
  Administered 2016-06-15: 1000 mL via INTRAVENOUS

## 2016-06-15 NOTE — Plan of Care (Signed)
Kayla Sawyer made aware that central line fell out

## 2016-06-15 NOTE — Progress Notes (Signed)
    Subjective  - POD #1, s/p ABF, SMA BPG< re-implant IMA  Still requiring NEO    Physical Exam:  Incisions OK Remains on NEO Doppler PT Bilateral abd soft Incisions      Assessment/Plan:  POD #2  Pulm:  Wean to extubate CV:  Wean Neo, looks a little dry, getting more volume GI:  con't NG OK to leave out if pulled out, Keep NPO Prophylaxis:  Lovenox (start Tonight) and Pepcid Acute blood loss anemia:  Stable HCT, did receive 2 Units prbc and 750 Cell saver yesterday On SSI for hyperglycemia HbA1c pending, pt does not carry diagnosis of diabetes  Kayla Sawyer, Wells 06/15/2016 7:49 AM --  Vitals:   06/15/16 0730 06/15/16 0734  BP: (!) 83/59   Pulse: 74   Resp: (!) 22   Temp:  99.1 F (37.3 C)    Intake/Output Summary (Last 24 hours) at 06/15/16 0749 Last data filed at 06/15/16 0726  Gross per 24 hour  Intake          8382.76 ml  Output             2235 ml  Net          6147.76 ml     Laboratory CBC    Component Value Date/Time   WBC 16.8 (H) 06/15/2016 0500   HGB 11.0 (L) 06/15/2016 0500   HCT 31.9 (L) 06/15/2016 0500   PLT 115 (L) 06/15/2016 0500    BMET    Component Value Date/Time   NA 135 06/15/2016 0500   K 4.4 06/15/2016 0500   CL 109 06/15/2016 0500   CO2 19 (L) 06/15/2016 0500   GLUCOSE 304 (H) 06/15/2016 0500   BUN 25 (H) 06/15/2016 0500   CREATININE 1.38 (H) 06/15/2016 0500   CREATININE 0.90 02/08/2016 0949   CALCIUM 8.3 (L) 06/15/2016 0500   GFRNONAA 38 (L) 06/15/2016 0500   GFRAA 44 (L) 06/15/2016 0500    COAG Lab Results  Component Value Date   INR 1.63 06/14/2016   INR 1.37 06/14/2016   INR 1.03 06/06/2016   No results found for: PTT  Antibiotics Anti-infectives    Start     Dose/Rate Route Frequency Ordered Stop   06/14/16 2030  cefUROXime (ZINACEF) 1.5 g in dextrose 5 % 50 mL IVPB     1.5 g 100 mL/hr over 30 Minutes Intravenous Every 12 hours 06/14/16 1545 06/15/16 2029   06/14/16 1215  cefUROXime (ZINACEF) 1.5 g  in dextrose 5 % 50 mL IVPB  Status:  Discontinued     1.5 g 100 mL/hr over 30 Minutes Intravenous  Once 06/14/16 1212 06/14/16 1542   06/14/16 0644  cefUROXime (ZINACEF) 1.5 g in dextrose 5 % 50 mL IVPB     1.5 g 100 mL/hr over 30 Minutes Intravenous 30 min pre-op 06/14/16 0644 06/14/16 1230   06/14/16 0554  dextrose 5 % with cefUROXime (ZINACEF) ADS Med    Comments:  Beverly Gust   : cabinet override      06/14/16 0554 06/14/16 0830       V. Leia Alf, M.D. [facility] of [place] Office: [phone] Pager:  [phone]

## 2016-06-15 NOTE — Evaluation (Signed)
Physical Therapy Evaluation Patient Details Name: Kayla Sawyer MRN: [id number] DOB: 1945/10/21 Today's Date: 06/15/2016   History of Present Illness  70 y.o. with known history of COPD and pulmonary hypertension. Patient with known history of peripheral vascular disease and bilateral lower extremity claudication. Does have a long-standing history of ongoing tobacco use. Patient underwent aortobifemoral bypass graft as well as aorto SMA bypass graft reimplantation of superior mesenteric artery on 11/29.  Clinical Impression  Patient presents with decreased independence with mobility due to deficits listed in PT problem list.  Mainly limited by pain and decreased activity tolerance with low BP in sitting and diaphoresis.  Patient will benefit from skilled PT in the acute setting to allow return home with family support and follow up HHPT.      Follow Up Recommendations Home health PT;Supervision/Assistance - 24 hour    Equipment Recommendations  None recommended by PT    Recommendations for Other Services       Precautions / Restrictions        Mobility  Bed Mobility Overal bed mobility: Needs Assistance Bed Mobility: Rolling;Sidelying to Sit Rolling: Mod assist Sidelying to sit: Max assist       General bed mobility comments: assist with rail to roll due to painful abdomen attempted to come upright, assist for legs off bed and to lift trunk  Transfers Overall transfer level: Needs assistance Equipment used: Rolling walker (2 wheeled) Transfers: Sit to/from [facility] Sit to Stand: Mod assist;+2 physical assistance Stand pivot transfers: +2 physical assistance;Mod assist       General transfer comment: assist for lifting, assist for multiple lines  Ambulation/Gait                Stairs            Wheelchair Mobility    Modified Rankin (Stroke Patients Only)       Balance Overall balance assessment: Needs assistance   Sitting  balance-Leahy Scale: Fair     Standing balance support: Bilateral upper extremity supported Standing balance-Leahy Scale: Poor Standing balance comment: UE support and assist for balance                             Pertinent Vitals/Pain Pain Assessment: Faces Faces Pain Scale: Hurts whole lot Pain Location: abdomen with mobility Pain Descriptors / Indicators: Grimacing;Guarding;Moaning Pain Intervention(s): Monitored during session;Patient requesting pain meds-RN notified    Home Living                        Prior Function                 Hand Dominance        Extremity/Trunk Assessment   Upper Extremity Assessment: Defer to OT evaluation           Lower Extremity Assessment: RLE deficits/detail;LLE deficits/detail RLE Deficits / Details: AROM WFL, but painful at groin, strength at least 3/5; 4/5 knee extension LLE Deficits / Details: AAROM limited by pain at groin, strength < 3/5 at hip, 3+/5 knee extension     Communication      Cognition Arousal/Alertness: Awake/alert Behavior During Therapy: WFL for tasks assessed/performed Overall Cognitive Status: Within Functional Limits for tasks assessed                      General Comments General comments (skin integrity, edema, etc.): BP 70's/40's initially sitting,  then up to 80's/50's; RN in room during session, pt diaphoretic with pain    Exercises     Assessment/Plan    PT Assessment Patient needs continued PT services  PT Problem List Decreased strength;Decreased mobility;Decreased range of motion;Decreased balance;Pain;Decreased knowledge of use of DME;Decreased activity tolerance          PT Treatment Interventions DME instruction;Gait training;Balance training;Stair training;Functional mobility training;Patient/family education;Therapeutic activities;Therapeutic exercise    PT Goals (Current goals can be found in the Care Plan section)  Acute Rehab PT  Goals Patient Stated Goal: To return to independent PT Goal Formulation: With patient Time For Goal Achievement: 06/22/16 Potential to Achieve Goals: Good    Frequency Min 3X/week   Barriers to discharge        Co-evaluation               End of Session Equipment Utilized During Treatment: Oxygen Activity Tolerance: Patient limited by pain;Patient limited by fatigue Patient left: in chair;with call bell/phone within reach Nurse Communication: Mobility status         Time: 9563-8756 PT Time Calculation (min) (ACUTE ONLY): 30 min   Charges:   PT Evaluation $PT Eval High Complexity: 1 Procedure PT Treatments $Therapeutic Activity: 8-22 mins   PT G CodesReginia Naas 29-Jun-2016, 4:33 PM  Magda Kiel, [place] 06/29/2016

## 2016-06-15 NOTE — Op Note (Signed)
Patient name: Kayla Sawyer MRN: [id number] DOB: 05-Nov-1945 Sex: female  06/14/2016 Pre-operative Diagnosis: Bilateral claudication Post-operative diagnosis:  Same Surgeon:  Annamarie Major Assistants:  Leontine Locket, Silva Bandy Procedure:   #1: Aortobifemoral bypass graft (14 x 7)   #2: Aorta to SMA bypass graft (7 mm interposition dacryon graft   #3: Reimplantation of the inferior mesenteric artery Anesthesia:  Gen. Blood Loss:  See anesthesia record Specimens:  None  Findings:  Proximal aortic anastomosis was end to end.  Distal anastomosis was to the common femoral artery bilaterally at the level of the profunda.  There was a high origin of the profunda on the left side.  The superior mesenteric artery bypass was end to side to the superior mesenteric artery and to the anterior surface of the tube portion of the aortobifemoral graft.  The inferior mesenteric artery was reimplanted to the proximal portion of the left limb of the aortic graft  Indications:  The patient suffers from severe lifestyle limiting claudication.  She is not a candidate for endovascular repair.  She has significant pulmonary disease and was seen and evaluated by her pulmonologist prior to her operation.  She also received cardiac clearance.  She has a chronic occlusion of superior mesenteric artery and we discussed possibly performing a superior mesenteric artery bypass as well as reimplanting her large inferior mesenteric artery  Procedure:  The patient was identified in the holding area and taken to [place] [facility]  The patient was then placed supine on the table. general anesthesia was administered.  The patient was prepped and draped in the usual sterile fashion.  A time out was called and antibiotics were administered.  Oblique incisions were made in both groins after evaluating the location of the femoral bifurcation with ultrasound.  Cautery was used to divide subcutaneous tissue down to the femoral sheath which  was opened sharply.  The common femoral artery was exposed from the inguinal ligament distally to the bifurcation.  Bilateral superficial femoral and profunda femoral arteries were individually isolated and encircled with vessel loops.  Crossing circumflex veins were ligated under the inguinal ligament.  The groins were packed with moist Ray-Tec.  Attention was turned towards the abdomen.  A midline incision was made from the xiphoid to below the umbilicus.  Cautery was used to the subcutaneous tissue down to the fascia which was opened in the midline.  The peritoneal cavity was then entered sharply.  The incision was then opened throughout its length.  The abdomen was grossly inspected.  There is no obvious pathology.  A Balfour was placed.  As was a Omni tract.  The transverse colon was reflected cephalad and the small bowel was mobilized to the patient's right.  The ligament of Treitz was taken down with combination of cautery and sharp dissection.  I did not divide the inferior mesenteric vein.  I then identified the infrarenal aorta which was somewhat aneurysmal.  The aorta was circumferentially exposed from the renal arteries down to the bifurcation.  I also isolated the inferior mesenteric artery.  I then created a tunnel between the abdominal cavity and the groins.  The tunnel was passed down each side staying directly anterior to the iliac artery making sure I was posterior to the ureter.  An umbilical tape was then passed.  Next, I exposed the superior mesenteric artery.  I identified the middle colic artery and the transverse mesocolon and then opened this horizontally at its base and dissected proximally  until I identified the superior mesenteric artery.  This was a healthy artery at this level measuring approximately 5 mm.  It was mobilized for about 3 cm.  Next, the patient was fully heparinized.  After the heparin circulated I occluded the infrarenal abdominal aorta with a Sanger clamp and then used a  marking clamp to occlude the infrarenal abdominal aorta at the level of the renal arteries.  I then transected the aorta just proximal to the inferior mesenteric artery.  There was significant mural thrombus which was evacuated.  I oversewed the infrarenal aorta with 3-0 Prolene in 2 layers.  Next, I selected a 14 x 7 dacryon graft.  I resected approximately 2 cm of the infrarenal aorta and performed endarterectomy of the aorta just below the renal arteries.  Once I had adequate lumen, a end to end anastomosis was created with 3-0 Prolene incorporating a felt strip.  Once this was done I placed a 18 mm dacryon tube graft over top of the anastomosis for added support.  There was excellent flow through the graft and each limb was then brought through the respective tunnel.  I performed the right groin anastomosis first.  After the femoral vessels were occluded a #11 blade was used to make an arteriotomy which was extended longitudinally with Potts scissors.  The arteriotomy went down to the distal common femoral artery and slightly onto the profunda femoral artery.  A running anastomosis was created with 5-0 Prolene.  Prior to completion the appropriate flushing maneuvers were performed and the anastomosis was completed.  Attention was then turned towards the left groin.  There was a high takeoff of the profunda femoral artery.  After occluding the femoral vessels with vascular clamps and #11 blade was used to make an arteriotomy which was extended longitudinally with Potts scissors.  This arteriotomy went out onto the superficial femoral artery for a few millimeters.  The graft was cut the appropriate length and a running anastomosis was created with 5-0 Prolene.  Prior to completion the appropriate flushing maneuvers were performed the anastomosis was completed.  Both groins were packed with gauze.  Next, I ligated the inferior mesenteric artery at its origin.  I placed a Serafin clamp distally after marking the  artery for orientation.  Henley clamps were used to occlude the left limb of the aortic graft.  On the lateral side of the graft of a #11 blade was used to make a graftotomy.  A #5 punch was used to open the graft.  A end to side anastomosis was created with 6-0 Prolene.  Prior to completion the appropriate flushing maneuvers were performed the anastomosis was completed and blood flow was reestablished to the left leg as well as the superior mesenteric artery.  Next, Attention was then turned towards the superior mesenteric artery.  This was occluded with vascular clamps.  I made a arteriotomy with 11 blade and extended longitudinally down the length of the superior mesenteric artery with Potts scissors.  I selected a 7 mm dacryon graft and performed a end to side anastomosis with 5-0 Prolene.  I then used a Satinsky clamp to occlude the tube portion of the aortic graft just above the bifurcation.  On the anterior surface I used a #11 blade to create a graftotomy and then opened this with a #5 punch.  After determining the appropriate length of the mesentery graft, a end to side anastomosis was performed between the 2 grafts with running 5-0 Prolene.  After the  appropriate flushing maneuvers were performed the anastomosis was completed.  I then used Doppler to evaluate signal within the SMA and IMA there were brisk signals.  She also had excellent signals in both profunda and superficial femoral arteries.  75 mg protamine was given.  After hemostasis was satisfactory, the groins were closed first by reapproximating the femoral sheath with 2-0 Vicryl.  The subcutaneous tissue was then closed with multiple layers of 3-0 Vicryl followed by 4-0 Vicryl the skin.  There is significant bruising within the raw surface of the abdomen.  We spent significant time trying to get hemostasis.  Once I was satisfied, I reapproximated the retroperitoneal tissue with running 2-0 Vicryl.  The small bowel was then placed back into its  anatomic position as was the colon.  I confirmed the NG-tube was in appropriate position.  I inspected the bowel to make sure that there were no defects.  Next the fascia was then closed with 2 running #1 PDS suture.  Subcutaneous tissues closed with 2-0 Vicryl followed by 4-0 Vicryl the skin.  All incisions were closed with Dermabond.  The patient was then taken to the intensive care unit in stable condition, remaining intubated   Disposition:  To PACU in stable condition.   Theotis Burrow, M.D. [facility] [place] Office: [phone] Pager:  [phone]

## 2016-06-15 NOTE — Progress Notes (Signed)
OT Cancellation Note  Patient Details Name: Kayla Sawyer MRN: [id number] DOB: 01-27-1946   Cancelled Treatment:    Reason Eval/Treat Not Completed: Fatigue/lethargy limiting ability to participate.  Pt just back to bed.  Will reattempt.  [place], OTR/L [phone]   Lucille Passy M 06/15/2016, 3:02 PM

## 2016-06-15 NOTE — Consult Note (Deleted)
PULMONARY / CRITICAL CARE MEDICINE   Name: Kayla Sawyer MRN: [id number] DOB: 10/24/1945    ADMISSION DATE:  06/14/2016 CONSULTATION DATE:  06/15/2016  REFERRING MD:  Harold Barban, M.D. / Vascular Surgery  CHIEF COMPLAINT:  Acute Respiratory Failure  SUMMARY:  70 y.o. with known history of COPD and pulmonary hypertension. Patient with known history of peripheral vascular disease and bilateral lower extremity claudication. Does have a long-standing history of ongoing tobacco use. Patient underwent aortobifemoral bypass graft as well as aorto SMA bypass graft reimplantation of superior mesenteric artery on 11/29. Patient has continued to be hypotensive overnight intermittently with some fluctuation of vasopressor requirement. Urine output has remained relatively stable. Patient did received 1 L bolus IV fluid with CVP of 5-6.   SUBJECTIVE: RT reports pt weaning on PSV, no distress.  Pt following commands, alert/indicating she wants ETT out.    VITAL SIGNS: BP (!) 98/48   Pulse 70   Temp 99.1 F (37.3 C) (Oral)   Resp (!) 22   Ht '5\' 8"'$  (1.727 m)   Wt 141 lb 15.6 oz (64.4 kg)   SpO2 100%   BMI 21.59 kg/m   HEMODYNAMICS: CVP:  [5 mmHg-9 mmHg] 5 mmHg  VENTILATOR SETTINGS: Vent Mode: PSV;CPAP FiO2 (%):  [40 %-50 %] 40 % Set Rate:  [12 bmp-22 bmp] 22 bmp Vt Set:  [550 mL] 550 mL PEEP:  [5 cmH20] 5 cmH20 Pressure Support:  [10 cmH20] 10 cmH20 Plateau Pressure:  [16 cmH20-17 cmH20] 16 cmH20  INTAKE / OUTPUT: I/O last 3 completed shifts: In: 8332.8 [I.V.:6172.8; Blood:1420; NG/GT:90; IV Piggyback:650] Out: 2235 [Urine:1085; Emesis/NG output:200; Blood:950]  PHYSICAL EXAMINATION: General:  Adult female, appears older than stated age Integument:  Warm & dry. No rash on exposed skin. Abdominal and inguinal incisions with mild surrounding bruising but otherwise clean, dry, and intact without erythema. HEENT:  Moist mucus membranes. No scleral injection or icterus.   Cardiovascular:   Regular rate. No edema. No appreciable JVD.  Pulmonary:  Clear to auscultation bilaterally.   Abdomen: Soft. Normal bowel sounds. Nondistended.  Musculoskeletal:  Normal bulk and tone. No joint deformity or effusion appreciated. Neurological:  Nods to questions and follows commands. Moving all 4 extremities equally.  LABS:  BMET  Recent Labs Lab 06/14/16 1133  06/14/16 1450 06/14/16 1610 06/15/16 0500  NA 142  < > 143 139 135  K 3.8  < > 4.1 4.2 4.4  CL  --   --   --  112* 109  CO2  --   --   --  24 19*  BUN  --   --   --  17 25*  CREATININE  --   --   --  1.16* 1.38*  GLUCOSE 112*  --   --  154* 304*  < > = values in this interval not displayed.  Electrolytes  Recent Labs Lab 06/14/16 1610 06/15/16 0500  CALCIUM 10.3 8.3*  MG 1.1* 1.7    CBC  Recent Labs Lab 06/14/16 1245  06/14/16 1450 06/14/16 1610 06/15/16 0500  WBC  --   --   --  13.0* 16.8*  HGB 10.3*  < > 11.2* 11.2* 11.0*  HCT 30.6*  < > 33.0* 33.1* 31.9*  PLT 150  --   --  109* 115*  < > = values in this interval not displayed.  Coag's  Recent Labs Lab 06/14/16 1245 06/14/16 1610  APTT >200* 37*  INR 1.37 1.63    Sepsis Markers  Recent Labs Lab 06/15/16 0628  LATICACIDVEN 1.9    ABG  Recent Labs Lab 06/14/16 1450 06/14/16 1611 06/15/16 0417  PHART 7.410 7.269* 7.285*  PCO2ART 40.3 54.0* 38.2  PO2ART 178.0* 144.0* 87.0    Liver Enzymes  Recent Labs Lab 06/15/16 0500  AST 29  ALT 14  ALKPHOS 24*  BILITOT 0.5  ALBUMIN 2.4*    Cardiac Enzymes  Recent Labs Lab 06/15/16 0626  TROPONINI <0.03    Glucose  Recent Labs Lab 06/15/16 0723  GLUCAP 206*    Imaging Portable Chest Xray  Result Date: 06/15/2016 CLINICAL DATA:  Check endotracheal tube placement, dyspnea EXAM: PORTABLE CHEST 1 VIEW COMPARISON:  06/14/2016 FINDINGS: Cardiac shadow is stable. Endotracheal tube, right jugular catheter and nasogastric catheter are again seen and stable. The lungs are  well aerated bilaterally. Persistent left retrocardiac density with left effusion is seen. The right lung remains clear. No bony abnormality is noted. IMPRESSION: Persistent left lower lobe consolidation and pleural effusion. Electronically Signed   By: Inez Catalina M.D.   On: 06/15/2016 07:35   Dg Chest Port 1 View  Result Date: 06/14/2016 CLINICAL DATA:  Hypoxia EXAM: PORTABLE CHEST 1 VIEW COMPARISON:  Chest radiograph December 30, 2012; chest CT December 30, 2015 FINDINGS: Endotracheal tube tip is 5.0 cm above carina. Nasogastric tube tip and side port are below the diaphragm. Central catheter tip is in the superior vena cava. No pneumothorax. There is atelectatic change in the left base with small left pleural effusion. There is mild atelectasis in the right base. The lungs elsewhere clear. Heart size and pulmonary vascularity are normal. No adenopathy. There is atherosclerotic calcification in the aorta. IMPRESSION: Tube and catheter positions as described without pneumothorax. Left base atelectasis with small left pleural effusion. Mild right base atelectasis. Stable cardiac silhouette. There is aortic atherosclerosis. Electronically Signed   By: Lowella Grip III M.D.   On: 06/14/2016 16:24   Dg Abd Portable 1v  Result Date: 06/14/2016 CLINICAL DATA:  Nasogastric tube placement EXAM: PORTABLE ABDOMEN - 1 VIEW COMPARISON:  None. FINDINGS: Nasogastric tube tip and side port are in the stomach. There is moderate stool in the colon. There is no bowel dilatation or air-fluid level suggesting bowel obstruction. No free air. There are scattered foci of arterial vascular calcification in the pelvis. IMPRESSION: Nasogastric tube tip and side port in stomach. Bowel gas pattern unremarkable without demonstrable bowel obstruction or free air. Electronically Signed   By: Lowella Grip III M.D.   On: 06/14/2016 16:25     STUDIES:  TTE 01/03/16:  LV normal in size with EF 60-65% without regional wall motion  abnormalities and with grade 1 diastolic dysfunction. LA & RA normal in size. RV normal in size and function. Pulmonary artery systolic pressure 27 mmHg. No aortic stenosis or regurgitation with poorly visualized valve. Aortic root normal in size. Trivial mitral regurgitation. Trivial pulmonic regurgitation. Trivial tricuspid regurgitation. No pericardial effusion. LHC 01/03/16:  Prox RCA to Mid RCA lesion, 30% stenosed.  Mid RCA to Dist RCA lesion, 20% stenosed.  Mid LAD lesion, 40% stenosed.  2nd Diag lesion, 50% stenosed.  Prox LAD to Mid LAD lesion, 20% stenosed. [place] 01/03/16: RA = 1 RV = 38/0/4 PA = 40/11 (26) PCW = 7 Fick cardiac output/index = 3.5/2.0 PVR = 5.0 WU Ao sat = 90% PA sat = 58%, 58%  ECHO 11/30 >>   MICROBIOLOGY: MRSA PCR 11/21:  Negative  ANTIBIOTICS: Zinacef 11/29 (surgical prophylaxis)  SIGNIFICANT EVENTS:  11/29 - Admit & aortobifemoral bypass graft along with aorta SMA bypass graft re-implantation of superior mesenteric artery 11/30 - Extubated   LINES/TUBES: OETT 7.5 11/29 >> 11/30 R IJ CVL 11/29 >>  L RADIAL ART LINE 11/29 >> FOLEY 11/29 >>  OGT 11/29 >> PIV  DISCUSSION:  70 y.o. female s/p aortobifemoral bypass graft along with aorta SMA bypass graft re-implantation of superior mesenteric artery on 11/29. Known history of pulmonary hypertension. Currently requiring low-dose Neo-Synephrine and remained intubated postoperatively. Administering gentle IV fluid bolus. Checking cardiac biomarkers and transthoracic echocardiogram.  ASSESSMENT / PLAN:  PULMONARY A: Acute Hypoxic Respiratory Failure - Post-op. H/O Moderate-Severe COPD w/ Emphysema - FEV1 1.28 L (53%) on 12/09/15. Possible Pulmonary Hypertension - Not present on RHC 01/03/16. ILD - Follows with Dr. Chase Caller. Possible UIP pattern in bases. Possibly secondary to scleroderma.  Tobacco Use Disorder  P:   Proceed with extubation  Pulmonary hygiene - IS, flutter, mobilize Intermittent  CXR  Add scheduled atrovent / xopenex Q6  Holding Spiriva Xopenex prn wheezing  CARDIOVASCULAR A:  Hypotension - Possibly related to sedation. Peripheral Vascular Disease - S/P aortobifemoral bypass graft along with aorta SMA bypass graft re-implantation of superior mesenteric artery 11/29. Non-obstructive Coronary Artery Disease - Based on [facility] 01/03/16. Hypercholesterolemia  P:  Continuous telemetry monitoring Vitals per unit protocol Vasopressor & MAP goals per vascular surgery  Trend Troponin I q6hr x3 Await ECHO  Continuing CVP monitoring  RENAL A:   Acute Renal Failure - Mild. UOP good. Hyperchloremia - Resolved. Hypomagnesemia - Replaced. NAGMA  P:   Trending UOP with Foley Monitor electrolytes and renal function daily Replace electrolytes as indicated LR @ 100cc/hr Trend Lactic Acid q6hr x3  GASTROINTESTINAL A:   No acute issues.  P:   NPO Pepcid IV q12hr  HEMATOLOGIC A:   Thrombocytopenia - Stable. Anemia - Mild. No signs of active bleeding.  P:  Trend cell counts daily w/ CBC SCDs Plan to start chemical DVT prophylaxis when ok with Vascular Surgery  INFECTIOUS A:   No acute infection.  P:   Zinacef Surgical Prophylaxis Monitor for signs of infection  ENDOCRINE A:   Hyperglycemia - No h/o DM. Likely due to  Dextrose IVF.  P:   Accu-Checks q4hr SSI per Sensitive Algorithm Checking Hgb A1c  NEUROLOGIC A:   Sedation on Ventilator - resolved Post-op Pain Control H/O Depression  P:   RASS goal: n/a   Fentanyl IV prn Pain Zoloft home dose VT daily Minimize sedating medications as able   RHEUMATOLOGICAL A: Possible Scleroderma - Follows with Dr. Gavin Pound.  P: No medications currently  FAMILY  - Updates:  No family at bedside 11/30.  - Inter-disciplinary family meet or Palliative Care meeting due by:  12/7  CC Time: 65 minutes   Noe Gens, NP-C [facility] Pgr: [phone] or if no answer  [phone] 06/15/2016, 8:51 AM

## 2016-06-15 NOTE — Progress Notes (Signed)
PULMONARY / CRITICAL CARE MEDICINE   Name: Kayla Sawyer MRN: [id number] DOB: 1946-01-19    ADMISSION DATE:  06/14/2016 CONSULTATION DATE:  06/15/2016  REFERRING MD:  Harold Barban, M.D. / Vascular Surgery  CHIEF COMPLAINT:  Acute Respiratory Failure  SUMMARY:  70 y.o. with known history of COPD and pulmonary hypertension. Patient with known history of peripheral vascular disease and bilateral lower extremity claudication. Does have a long-standing history of ongoing tobacco use. Patient underwent aortobifemoral bypass graft as well as aorto SMA bypass graft reimplantation of superior mesenteric artery on 11/29. Patient has continued to be hypotensive overnight intermittently with some fluctuation of vasopressor requirement. Urine output has remained relatively stable. Patient did received 1 L bolus IV fluid with CVP of 5-6.   SUBJECTIVE: RT reports pt weaning on PSV, no distress.  Pt following commands, alert/indicating she wants ETT out.  CVP 5  VITAL SIGNS: BP (!) 98/48   Pulse 78   Temp 99.1 F (37.3 C) (Oral)   Resp (!) 24   Ht '5\' 8"'$  (1.727 m)   Wt 141 lb 15.6 oz (64.4 kg)   SpO2 100%   BMI 21.59 kg/m   HEMODYNAMICS: CVP:  [5 mmHg-9 mmHg] 5 mmHg  VENTILATOR SETTINGS: Vent Mode: PSV;CPAP FiO2 (%):  [40 %-50 %] 40 % Set Rate:  [12 bmp-22 bmp] 22 bmp Vt Set:  [550 mL] 550 mL PEEP:  [5 cmH20] 5 cmH20 Pressure Support:  [10 cmH20] 10 cmH20 Plateau Pressure:  [16 cmH20-17 cmH20] 16 cmH20  INTAKE / OUTPUT: I/O last 3 completed shifts: In: 8332.8 [I.V.:6172.8; Blood:1420; NG/GT:90; IV Piggyback:650] Out: 2235 [Urine:1085; Emesis/NG output:200; Blood:950]  PHYSICAL EXAMINATION: General:  Adult female, appears older than stated age Integument:  Warm & dry. No rash on exposed skin. Abdominal and inguinal incisions with mild surrounding bruising but otherwise clean, dry, and intact without erythema. HEENT:  Moist mucus membranes. No scleral injection or icterus.    Cardiovascular:  Regular rate. No edema. No appreciable JVD.  Pulmonary:  Clear to auscultation bilaterally.   Abdomen: Soft. Normal bowel sounds. Nondistended.  Musculoskeletal:  Normal bulk and tone. No joint deformity or effusion appreciated. Neurological:  Nods to questions and follows commands. Moving all 4 extremities equally.  LABS:  BMET  Recent Labs Lab 06/14/16 1133  06/14/16 1450 06/14/16 1610 06/15/16 0500  NA 142  < > 143 139 135  K 3.8  < > 4.1 4.2 4.4  CL  --   --   --  112* 109  CO2  --   --   --  24 19*  BUN  --   --   --  17 25*  CREATININE  --   --   --  1.16* 1.38*  GLUCOSE 112*  --   --  154* 304*  < > = values in this interval not displayed.  Electrolytes  Recent Labs Lab 06/14/16 1610 06/15/16 0500  CALCIUM 10.3 8.3*  MG 1.1* 1.7    CBC  Recent Labs Lab 06/14/16 1245  06/14/16 1450 06/14/16 1610 06/15/16 0500  WBC  --   --   --  13.0* 16.8*  HGB 10.3*  < > 11.2* 11.2* 11.0*  HCT 30.6*  < > 33.0* 33.1* 31.9*  PLT 150  --   --  109* 115*  < > = values in this interval not displayed.  Coag's  Recent Labs Lab 06/14/16 1245 06/14/16 1610  APTT >200* 37*  INR 1.37 1.63    Sepsis  Markers  Recent Labs Lab 06/15/16 0628  LATICACIDVEN 1.9    ABG  Recent Labs Lab 06/14/16 1450 06/14/16 1611 06/15/16 0417  PHART 7.410 7.269* 7.285*  PCO2ART 40.3 54.0* 38.2  PO2ART 178.0* 144.0* 87.0    Liver Enzymes  Recent Labs Lab 06/15/16 0500  AST 29  ALT 14  ALKPHOS 24*  BILITOT 0.5  ALBUMIN 2.4*    Cardiac Enzymes  Recent Labs Lab 06/15/16 0626  TROPONINI <0.03    Glucose  Recent Labs Lab 06/15/16 0723  GLUCAP 206*    Imaging Portable Chest Xray  Result Date: 06/15/2016 CLINICAL DATA:  Check endotracheal tube placement, dyspnea EXAM: PORTABLE CHEST 1 VIEW COMPARISON:  06/14/2016 FINDINGS: Cardiac shadow is stable. Endotracheal tube, right jugular catheter and nasogastric catheter are again seen and  stable. The lungs are well aerated bilaterally. Persistent left retrocardiac density with left effusion is seen. The right lung remains clear. No bony abnormality is noted. IMPRESSION: Persistent left lower lobe consolidation and pleural effusion. Electronically Signed   By: Inez Catalina M.D.   On: 06/15/2016 07:35   Dg Chest Port 1 View  Result Date: 06/14/2016 CLINICAL DATA:  Hypoxia EXAM: PORTABLE CHEST 1 VIEW COMPARISON:  Chest radiograph December 30, 2012; chest CT December 30, 2015 FINDINGS: Endotracheal tube tip is 5.0 cm above carina. Nasogastric tube tip and side port are below the diaphragm. Central catheter tip is in the superior vena cava. No pneumothorax. There is atelectatic change in the left base with small left pleural effusion. There is mild atelectasis in the right base. The lungs elsewhere clear. Heart size and pulmonary vascularity are normal. No adenopathy. There is atherosclerotic calcification in the aorta. IMPRESSION: Tube and catheter positions as described without pneumothorax. Left base atelectasis with small left pleural effusion. Mild right base atelectasis. Stable cardiac silhouette. There is aortic atherosclerosis. Electronically Signed   By: Lowella Grip III M.D.   On: 06/14/2016 16:24   Dg Abd Portable 1v  Result Date: 06/14/2016 CLINICAL DATA:  Nasogastric tube placement EXAM: PORTABLE ABDOMEN - 1 VIEW COMPARISON:  None. FINDINGS: Nasogastric tube tip and side port are in the stomach. There is moderate stool in the colon. There is no bowel dilatation or air-fluid level suggesting bowel obstruction. No free air. There are scattered foci of arterial vascular calcification in the pelvis. IMPRESSION: Nasogastric tube tip and side port in stomach. Bowel gas pattern unremarkable without demonstrable bowel obstruction or free air. Electronically Signed   By: Lowella Grip III M.D.   On: 06/14/2016 16:25     STUDIES:  TTE 01/03/16:  LV normal in size with EF 60-65% without  regional wall motion abnormalities and with grade 1 diastolic dysfunction. LA & RA normal in size. RV normal in size and function. Pulmonary artery systolic pressure 27 mmHg. No aortic stenosis or regurgitation with poorly visualized valve. Aortic root normal in size. Trivial mitral regurgitation. Trivial pulmonic regurgitation. Trivial tricuspid regurgitation. No pericardial effusion. LHC 01/03/16:  Prox RCA to Mid RCA lesion, 30% stenosed.  Mid RCA to Dist RCA lesion, 20% stenosed.  Mid LAD lesion, 40% stenosed.  2nd Diag lesion, 50% stenosed.  Prox LAD to Mid LAD lesion, 20% stenosed. [place] 01/03/16: RA = 1 RV = 38/0/4 PA = 40/11 (26) PCW = 7 Fick cardiac output/index = 3.5/2.0 PVR = 5.0 WU Ao sat = 90% PA sat = 58%, 58%  ECHO 11/30 >>   MICROBIOLOGY: MRSA PCR 11/21:  Negative  ANTIBIOTICS: Zinacef 11/29 (surgical prophylaxis)  SIGNIFICANT EVENTS: 11/29 - Admit & aortobifemoral bypass graft along with aorta SMA bypass graft re-implantation of superior mesenteric artery 11/30 - Extubated   LINES/TUBES: OETT 7.5 11/29 >> 11/30 R IJ CVL 11/29 >>  L RADIAL ART LINE 11/29 >> FOLEY 11/29 >>  OGT 11/29 >> PIV  DISCUSSION:  70 y.o. female s/p aortobifemoral bypass graft along with aorta SMA bypass graft re-implantation of superior mesenteric artery on 11/29. Known history of pulmonary hypertension. Currently requiring low-dose Neo-Synephrine and remained intubated postoperatively. Administering gentle IV fluid bolus. Checking cardiac biomarkers and transthoracic echocardiogram.  ASSESSMENT / PLAN:  PULMONARY A: Acute Hypoxic Respiratory Failure - Post-op. H/O Moderate-Severe COPD w/ Emphysema - FEV1 1.28 L (53%) on 12/09/15. Possible Pulmonary Hypertension - Not present on RHC 01/03/16. ILD - Follows with Dr. Chase Caller. Possible UIP pattern in bases. Possibly secondary to scleroderma.  Tobacco Use Disorder  P:   Proceed with extubation  Pulmonary hygiene - IS, flutter,  mobilize Intermittent CXR  Add scheduled atrovent / xopenex Q6  Holding Spiriva Xopenex prn wheezing  CARDIOVASCULAR A:  Hypotension - Possibly related to sedation. Peripheral Vascular Disease - S/P aortobifemoral bypass graft along with aorta SMA bypass graft re-implantation of superior mesenteric artery 11/29. Non-obstructive Coronary Artery Disease - Based on [facility] 01/03/16. Hypercholesterolemia  P:  Continuous telemetry monitoring Vitals per unit protocol Vasopressor & MAP goals per vascular surgery  Trend Troponin I q6hr x3 Await ECHO  Continuing CVP monitoring 500 ml LR bolus x1  RENAL A:   Acute Renal Failure - Mild. UOP good. Hyperchloremia - Resolved. Hypomagnesemia - Replaced. NAGMA  P:   Trending UOP with Foley Monitor electrolytes and renal function daily Replace electrolytes as indicated LR @ 100cc/hr Trend Lactic Acid q6hr x3  GASTROINTESTINAL A:   No acute issues.  P:   NPO Pepcid IV q12hr  HEMATOLOGIC A:   Thrombocytopenia - Stable. Anemia - Mild. No signs of active bleeding.  P:  Trend cell counts daily w/ CBC SCDs Plan to start chemical DVT prophylaxis when ok with Vascular Surgery  INFECTIOUS A:   No acute infection.  P:   Zinacef Surgical Prophylaxis Monitor for signs of infection  ENDOCRINE A:   Hyperglycemia - No h/o DM. Likely due to  Dextrose IVF.  P:   Accu-Checks q4hr SSI per Sensitive Algorithm Checking Hgb A1c  NEUROLOGIC A:   Sedation on Ventilator - resolved Post-op Pain Control H/O Depression  P:   RASS goal: n/a   Fentanyl IV prn Pain Zoloft home dose VT daily Minimize sedating medications as able   RHEUMATOLOGICAL A: Possible Scleroderma - Follows with Dr. Gavin Pound.  P: No medications currently  FAMILY  - Updates:  No family at bedside 11/30.  - Inter-disciplinary family meet or Palliative Care meeting due by:  12/7  CC Time: 46 minutes   Noe Gens, NP-C [place] [facility] Pgr: [phone] or if no answer [phone] 06/15/2016, 8:56 AM   STAFF NOTE: I, Merrie Roof, MD FACP have personally reviewed patient's available data, including medical history, events of note, physical examination and test results as part of my evaluation. I have discussed with resident/NP and other care providers such as pharmacist, RN and RRT. In addition, I personally evaluated patient and elicited key findings of: awake, follows commands, crackles, course BS, pcxr increasing hilar edema, wean cpap 5 ps5, great neurostatus, volumes good, prio rabg noted, clinically meets criteria for extubation, repeat abg done wnl, pos balance obtained, some concerns volume  status, however cvp 6, was pos over 6 liters last 24 hours, can maintain LR at 100 but low threshold to reduce, will obtain pcxr in pm and assess for edema, continued neo needed, cvp 6, allow pos balance, obtain cortisol, echo trop needed, some atn, bmet in pm for k , may need to dc lR and use saline, obtain Lactic acid The patient is critically ill with multiple organ systems failure and requires high complexity decision making for assessment and support, frequent evaluation and titration of therapies, application of advanced monitoring technologies and extensive interpretation of multiple databases.   Critical Care Time devoted to patient care services described in this note is 30 Minutes. This time reflects time of care of this signee: Merrie Roof, MD FACP. This critical care time does not reflect procedure time, or teaching time or supervisory time of PA/NP/Med student/Med Resident etc but could involve care discussion time. Rest per NP/medical resident whose note is outlined above and that I agree with   Lavon Paganini. Titus Mould, MD, [place] Pgr: [place] 06/15/2016 10:33 AM

## 2016-06-15 NOTE — Consult Note (Signed)
PULMONARY / CRITICAL CARE MEDICINE   Name: Kayla Sawyer MRN: [id number] DOB: February 08, 1946    ADMISSION DATE:  06/14/2016 CONSULTATION DATE:  06/15/2016  REFERRING MD:  Harold Barban, M.D. / Vascular Surgery  CHIEF COMPLAINT:  Acute Respiratory Failure  HISTORY OF PRESENT ILLNESS:  70 y.o. with known history of COPD and pulmonary hypertension. History obtained from electronic medical record as the patient is currently intubated and sedated. Patient with known history of peripheral vascular disease and bilateral lower extremity claudication. Does have a long-standing history of ongoing tobacco use. Patient underwent aortobifemoral bypass graft as well as aorto SMA bypass graft reimplantation of superior mesenteric artery on 11/29. Patient has continued to be hypotensive overnight intermittently with some fluctuation of vasopressor requirement. Urine output has remained relatively stable. Patient did received 1 L bolus IV fluid with CVP of 5-6.  PAST MEDICAL HISTORY :  Past Medical History:  Diagnosis Date  . Arthritis   . COPD (chronic obstructive pulmonary disease) ([place])   . Depression   . E. coli pyelonephritis 6//2014  . GERD (gastroesophageal reflux disease)   . Pneumonia   . Pulmonary hypertension   . Sepsis ([place])     PAST SURGICAL HISTORY: Past Surgical History:  Procedure Laterality Date  . ABDOMINAL HYSTERECTOMY     partial  . BACK SURGERY     L4/5  . CARDIAC CATHETERIZATION N/A 01/03/2016   Procedure: Right/Left Heart Cath and Coronary Angiography;  Surgeon: Jolaine Artist, MD;  Location: [facility];  Service: Cardiovascular;  Laterality: N/A;  . COLONOSCOPY N/A 08/05/2015   Procedure: COLONOSCOPY;  Surgeon: Rogene Houston, MD;  Location: [facility];  Service: Endoscopy;  Laterality: N/A;  730  . ELBOW SURGERY    . PERIPHERAL VASCULAR CATHETERIZATION N/A 02/16/2016   Procedure: Abdominal Aortogram w/Lower Extremity;  Surgeon: Wellington Hampshire, MD;  Location:  [facility];  Service: Cardiovascular;  Laterality: N/A;    Allergies  Allergen Reactions  . Sulfur Nausea Only    No current facility-administered medications on file prior to encounter.    Current Outpatient Prescriptions on File Prior to Encounter  Medication Sig  . acetaminophen (TYLENOL) 500 MG tablet Take 500 mg by mouth every 6 (six) hours as needed for mild pain.  Marland Kitchen aspirin EC 81 MG tablet Take 1 tablet (81 mg total) by mouth daily.  Marland Kitchen atorvastatin (LIPITOR) 20 MG tablet Take 1 tablet (20 mg total) by mouth daily.  . cholecalciferol (VITAMIN D) 1000 units tablet Take 1,000 Units by mouth daily.  . Multiple Vitamin (MULTIVITAMIN WITH MINERALS) TABS tablet Take 1 tablet by mouth daily.  Marland Kitchen omeprazole (PRILOSEC) 20 MG capsule Take 20 mg by mouth daily.   . Tiotropium Bromide Monohydrate (SPIRIVA RESPIMAT) 2.5 MCG/ACT AERS Inhale 2 puffs into the lungs daily.    FAMILY HISTORY:  Family History  Problem Relation Age of Onset  . Diabetes Mother   . Emphysema Father     SOCIAL HISTORY: Social History   Social History  . Marital status: Married    Spouse name: N/A  . Number of children: N/A  . Years of education: N/A   Social History Main Topics  . Smoking status: Current Every Day Smoker    Packs/day: 1.00    Years: 50.00    Types: Cigarettes  . Smokeless tobacco: Never Used     Comment: currently smokes 0.5 pack/day 06/06/16  . Alcohol use No  . Drug use: No  . Sexual  activity: No   Other Topics Concern  . None   Social History Narrative  . None    REVIEW OF SYSTEMS:  Unable to obtain with patient intubated & sedated.  SUBJECTIVE: As above.  VITAL SIGNS: BP (!) 88/56   Pulse 66   Temp 99.5 F (37.5 C) (Oral)   Resp (!) 22   Ht '5\' 8"'$  (1.727 m)   Wt 141 lb 15.6 oz (64.4 kg)   SpO2 100%   BMI 21.59 kg/m   HEMODYNAMICS: CVP:  [9 mmHg] 9 mmHg  VENTILATOR SETTINGS: Vent Mode: PRVC FiO2 (%):  [40 %-50 %] 40 % Set Rate:  [12 bmp-16 bmp] 16  bmp Vt Set:  [550 mL] 550 mL PEEP:  [5 cmH20] 5 cmH20 Plateau Pressure:  [16 cmH20-17 cmH20] 16 cmH20  INTAKE / OUTPUT: I/O last 3 completed shifts: In: 5871.4 [I.V.:3901.4; Blood:1420; IV Piggyback:550] Out: 1610 [Urine:740; Blood:950]  PHYSICAL EXAMINATION: General:  Eyes closed but does awake. No acute distress. No family at bedside.  Integument:  Warm & dry. No rash on exposed skin. Abdominal and inguinal incisions with mild surrounding bruising but otherwise clean, dry, and intact without erythema. Lymphatics:  No appreciated cervical or supraclavicular lymphadenoapthy. HEENT:  Moist mucus membranes. No scleral injection or icterus. Endotracheal tube in place.  Cardiovascular:  Regular rate. No edema. No appreciable JVD.  Pulmonary:  Clear to auscultation bilaterally. Symmetric chest wall rise on ventilator. Abdomen: Soft. Normal bowel sounds. Nondistended.  Musculoskeletal:  Normal bulk and tone. No joint deformity or effusion appreciated. Neurological:  Nods to questions and follows commands. Moving all 4 extremities equally. Attends to voice. Psychiatric:  Unable to assess with endotracheal intubation.  LABS:  BMET  Recent Labs Lab 06/14/16 1610  NA 139  K 4.2  CL 112*  CO2 24  BUN 17  CREATININE 1.16*  GLUCOSE 154*    Electrolytes  Recent Labs Lab 06/14/16 1610  CALCIUM 10.3  MG 1.1*    CBC  Recent Labs Lab 06/14/16 1245 06/14/16 1610 06/15/16 0500  WBC  --  13.0* 16.8*  HGB 10.3* 11.2* 11.0*  HCT 30.6* 33.1* 31.9*  PLT 150 109* 115*    Coag's  Recent Labs Lab 06/14/16 1245 06/14/16 1610  APTT >200* 37*  INR 1.37 1.63    Sepsis Markers No results for input(s): LATICACIDVEN, PROCALCITON, O2SATVEN in the last 168 hours.  ABG  Recent Labs Lab 06/14/16 1611 06/15/16 0417  PHART 7.269* 7.285*  PCO2ART 54.0* 38.2  PO2ART 144.0* 87.0    Liver Enzymes No results for input(s): AST, ALT, ALKPHOS, BILITOT, ALBUMIN in the last 168  hours.  Cardiac Enzymes No results for input(s): TROPONINI, PROBNP in the last 168 hours.  Glucose No results for input(s): GLUCAP in the last 168 hours.  Imaging Dg Chest Port 1 View  Result Date: 06/14/2016 CLINICAL DATA:  Hypoxia EXAM: PORTABLE CHEST 1 VIEW COMPARISON:  Chest radiograph December 30, 2012; chest CT December 30, 2015 FINDINGS: Endotracheal tube tip is 5.0 cm above carina. Nasogastric tube tip and side port are below the diaphragm. Central catheter tip is in the superior vena cava. No pneumothorax. There is atelectatic change in the left base with small left pleural effusion. There is mild atelectasis in the right base. The lungs elsewhere clear. Heart size and pulmonary vascularity are normal. No adenopathy. There is atherosclerotic calcification in the aorta. IMPRESSION: Tube and catheter positions as described without pneumothorax. Left base atelectasis with small left pleural effusion. Mild right base  atelectasis. Stable cardiac silhouette. There is aortic atherosclerosis. Electronically Signed   By: Lowella Grip III M.D.   On: 06/14/2016 16:24   Dg Abd Portable 1v  Result Date: 06/14/2016 CLINICAL DATA:  Nasogastric tube placement EXAM: PORTABLE ABDOMEN - 1 VIEW COMPARISON:  None. FINDINGS: Nasogastric tube tip and side port are in the stomach. There is moderate stool in the colon. There is no bowel dilatation or air-fluid level suggesting bowel obstruction. No free air. There are scattered foci of arterial vascular calcification in the pelvis. IMPRESSION: Nasogastric tube tip and side port in stomach. Bowel gas pattern unremarkable without demonstrable bowel obstruction or free air. Electronically Signed   By: Lowella Grip III M.D.   On: 06/14/2016 16:25     STUDIES:  TTE 01/03/16:  LV normal in size with EF 60-65% without regional wall motion abnormalities and with grade 1 diastolic dysfunction. LA & RA normal in size. RV normal in size and function. Pulmonary artery  systolic pressure 27 mmHg. No aortic stenosis or regurgitation with poorly visualized valve. Aortic root normal in size. Trivial mitral regurgitation. Trivial pulmonic regurgitation. Trivial tricuspid regurgitation. No pericardial effusion. LHC 01/03/16:  Prox RCA to Mid RCA lesion, 30% stenosed.  Mid RCA to Dist RCA lesion, 20% stenosed.  Mid LAD lesion, 40% stenosed.  2nd Diag lesion, 50% stenosed.  Prox LAD to Mid LAD lesion, 20% stenosed. [place] 01/03/16: RA = 1 RV = 38/0/4 PA = 40/11 (26) PCW = 7 Fick cardiac output/index = 3.5/2.0 PVR = 5.0 WU Ao sat = 90% PA sat = 58%, 58%  MICROBIOLOGY: MRSA PCR 11/21:  Negative  ANTIBIOTICS: Zinacef 11/29 (surgical prophylaxis)  SIGNIFICANT EVENTS: 11/29 - Admit & aortobifemoral bypass graft along with aorta SMA bypass graft re-implantation of superior mesenteric artery  LINES/TUBES: OETT 7.5 11/29 >> R IJ CVL 11/29 >> L RADIAL ART LINE 11/29 >> FOLEY 11/29 >> OGT 11/29 >> PIV  DISCUSSION:  70 y.o. female s/p aortobifemoral bypass graft along with aorta SMA bypass graft re-implantation of superior mesenteric artery on 11/29. Known history of pulmonary hypertension. Currently requiring low-dose Neo-Synephrine and remained intubated postoperatively. Administering gentle IV fluid bolus. Checking cardiac biomarkers and transthoracic echocardiogram.  ASSESSMENT / PLAN:  PULMONARY A: Acute Hypoxic Respiratory Failure - Post-op. H/O Moderate-Severe COPD w/ Emphysema - FEV1 1.28 L (53%) on 12/09/15. Possible Pulmonary Hypertension - Not present on RHC 01/03/16. ILD - Follows with Dr. Chase Caller. Possible UIP pattern in bases. Possibly secondary to scleroderma.  Tobacco Use Disorder  P:   Full Vent Support Weaning FiO2 for Sat >92% Intermittent Port CXR & ABG SBT & PS wean when ok with Vascular Surgery Holding Spiriva Xopenex prn wheezing  CARDIOVASCULAR A:  Hypotension - Possibly related to sedation. Peripheral Vascular Disease  - S/P aortobifemoral bypass graft along with aorta SMA bypass graft re-implantation of superior mesenteric artery 11/29. Non-obstructive Coronary Artery Disease - Based on [facility] 01/03/16. Hypercholesterolemia  P:  Continuous telemetry monitoring Vitals per unit protocol Vasopressor & MAP goals per vascular surgery  Trending Troponin I q6hr x3 Checking Complete Echocardiogram LR 1L bolus over 2 hour Continuing CVP monitoring  RENAL A:   Acute Renal Failure - Mild. UOP good. Hyperchloremia - Resolved. Hypomagnesemia - Replaced. NAGMA  P:   Trending UOP with Foley Monitoring electrolytes and renal function daily Replacing electrolytes as indicated Increasing LR to 100cc/hr D/C D5 1/2 NS w/ hyperglycemia Checking/Trending Lactic Acid q6hr x3  GASTROINTESTINAL A:   No acute issues.  P:  NPO Pepcid IV q12hr Holding on tube feedings  HEMATOLOGIC A:   Thrombocytopenia - Stable. Anemia - Mild. No signs of active bleeding.  P:  Trending cell counts daily w/ CBC SCDs Plan to start chemical DVT prophylaxis when ok with Vascular Surgery  INFECTIOUS A:   No acute infection.  P:   Zinacef Surgical Prophylaxis Monitor for signs of infection  ENDOCRINE A:   Hyperglycemia - No h/o DM. Likely due to  Dextrose IVF.  P:   D/C D5 1/2 NS Accu-Checks q4hr SSI per Sensitive Algorithm Checking Hgb A1c  NEUROLOGIC A:   Sedation on Ventilator Post-op Pain Control H/O Depression  P:   RASS goal: 0 to -1 Precedex gtt D/C Morphine  Fentanyl IV prn Pain Zoloft home dose VT daily  RHEUMATOLOGICAL A: Possible Scleroderma - Follows with Dr. Gavin Pound.  P: No medications currently  FAMILY  - Updates:  No family at bedside 11/30.  - Inter-disciplinary family meet or Palliative Care meeting due by:  12/7  I have spent a total of 40 minutes of critical care time today caring for the patient and reviewing the patient's electronic medical record.  Sonia Baller  Ashok Cordia, M.D. [facility] Pulmonary & Critical Care Pager:  [phone] After 3pm or if no response, call [phone] 06/15/2016, 6:00 AM

## 2016-06-15 NOTE — Procedures (Signed)
Extubation Procedure Note  Patient Details:   Name: Kayla Sawyer DOB: [birth year] MRN: [id number]   Airway Documentation:     Evaluation  O2 sats: stable throughout Complications: No apparent complications Patient did tolerate procedure well. Bilateral Breath Sounds: Clear   Yes  3l/min [place] Instructed Incentive spirometer  Revonda Standard 06/15/2016, 8:48 AM

## 2016-06-15 NOTE — Care Management Note (Addendum)
Case Management Note  Patient Details  Name: RENDY LAZARD MRN: [id number] Date of Birth: Oct 02, 1945  Subjective/Objective:     Pt s/p Aortobifemoral bypass graft (14 x 7)                         #2: Aorta to SMA bypass graft (7 mm interposition dacryon graft                         #3: Reimplantation of the inferior mesenteric artery               Action/Plan:  PTA from home with husband.  Pt states she uses rollator in the home and has shower chair.  PT ordered.   CM will continue to follow for discharge needs   Expected Discharge Date:                  Expected Discharge Plan:  [place]  In-House Referral:     Discharge planning Services  CM Consult  Post Acute Care Choice:    Choice offered to:     DME Arranged:    DME Agency:     HH Arranged:    HH Agency:     Status of Service:  In process, will continue to follow  If discussed at Long Length of Stay Meetings, dates discussed:    Additional Comments:  Maryclare Labrador, RN 06/15/2016, 10:19 AM

## 2016-06-15 NOTE — Anesthesia Postprocedure Evaluation (Signed)
Anesthesia Post Note  Patient: Kayla Sawyer  Procedure(s) Performed: Procedure(s) (LRB): AORTOBIFEMORAL BYPASS GRAFT AORTA SMA BYPASS GRAFT RE-IMPLANTATION  OF SUPERIOR MESENTERIC ARTERY (Bilateral)  Patient location during evaluation: SICU Anesthesia Type: General Level of consciousness: sedated and patient remains intubated per anesthesia plan Pain management: pain level controlled Vital Signs Assessment: post-procedure vital signs reviewed and stable Respiratory status: patient remains intubated per anesthesia plan Cardiovascular status: stable Anesthetic complications: no    Last Vitals:  Vitals:   06/15/16 1430 06/15/16 1445  BP:    Pulse: 85 84  Resp: (!) 22 19  Temp:      Last Pain:  Vitals:   06/15/16 1302  TempSrc:   PainSc: 2                  Sopheap Boehle,JAMES TERRILL

## 2016-06-15 NOTE — Progress Notes (Signed)
Dr. Jimmy Footman made aware of morning ABG results. Order to increase respiratory rate to 22. Will continue to closely monitor. Richarda Blade RN

## 2016-06-16 ENCOUNTER — Inpatient Hospital Stay (HOSPITAL_COMMUNITY): Payer: Medicare Other

## 2016-06-16 DIAGNOSIS — Z95828 Presence of other vascular implants and grafts: Secondary | ICD-10-CM

## 2016-06-16 DIAGNOSIS — I959 Hypotension, unspecified: Secondary | ICD-10-CM

## 2016-06-16 DIAGNOSIS — R0600 Dyspnea, unspecified: Secondary | ICD-10-CM

## 2016-06-16 LAB — ECHOCARDIOGRAM COMPLETE
E decel time: 192 msec
E/e' ratio: 7.69
Height: 68 in
LA vol A4C: 41.8 ml
LA vol index: 26.8 mL/m2
LA vol: 47.1 mL
LV E/e' medial: 7.69
LV E/e'average: 7.69
LV dias vol index: 35 mL/m2
LV dias vol: 62 mL (ref 46–106)
LV e' LATERAL: 11.4 cm/s
LV sys vol index: 10 mL/m2
LV sys vol: 17 mL (ref 14–42)
LVOT VTI: 20.6 cm
LVOT peak grad rest: 5 mmHg
LVOT peak vel: 113 cm/s
MV Dec: 192
MV Peak grad: 3 mmHg
MV pk A vel: 119 m/s
MV pk E vel: 87.7 m/s
Simpson's disk: 72
Stroke v: 45 ml
TAPSE: 26.9 mm
TDI e' lateral: 11.4
Weight: 2271.62 oz

## 2016-06-16 LAB — RENAL FUNCTION PANEL
Albumin: 1.9 g/dL — ABNORMAL LOW (ref 3.5–5.0)
Anion gap: 7 (ref 5–15)
BUN: 29 mg/dL — ABNORMAL HIGH (ref 6–20)
CO2: 21 mmol/L — ABNORMAL LOW (ref 22–32)
Calcium: 7.9 mg/dL — ABNORMAL LOW (ref 8.9–10.3)
Chloride: 108 mmol/L (ref 101–111)
Creatinine, Ser: 1.18 mg/dL — ABNORMAL HIGH (ref 0.44–1.00)
GFR calc Af Amer: 53 mL/min — ABNORMAL LOW (ref >60)
GFR calc non Af Amer: 46 mL/min — ABNORMAL LOW (ref >60)
Glucose, Bld: 116 mg/dL — ABNORMAL HIGH (ref 65–99)
Phosphorus: 3.9 mg/dL (ref 2.5–4.6)
Potassium: 3.6 mmol/L (ref 3.5–5.1)
Sodium: 136 mmol/L (ref 135–145)

## 2016-06-16 LAB — GLUCOSE, CAPILLARY
Glucose-Capillary: 102 mg/dL — ABNORMAL HIGH (ref 65–99)
Glucose-Capillary: 107 mg/dL — ABNORMAL HIGH (ref 65–99)
Glucose-Capillary: 117 mg/dL — ABNORMAL HIGH (ref 65–99)
Glucose-Capillary: 121 mg/dL — ABNORMAL HIGH (ref 65–99)
Glucose-Capillary: 128 mg/dL — ABNORMAL HIGH (ref 65–99)
Glucose-Capillary: 74 mg/dL (ref 65–99)
Glucose-Capillary: 79 mg/dL (ref 65–99)

## 2016-06-16 LAB — CBC WITH DIFFERENTIAL/PLATELET
Basophils Absolute: 0 10*3/uL (ref 0.0–0.1)
Basophils Relative: 0 %
Eosinophils Absolute: 0 10*3/uL (ref 0.0–0.7)
Eosinophils Relative: 0 %
HCT: 24.8 % — ABNORMAL LOW (ref 36.0–46.0)
Hemoglobin: 8.6 g/dL — ABNORMAL LOW (ref 12.0–15.0)
Lymphocytes Relative: 7 %
Lymphs Abs: 1 10*3/uL (ref 0.7–4.0)
MCH: 30.6 pg (ref 26.0–34.0)
MCHC: 34.7 g/dL (ref 30.0–36.0)
MCV: 88.3 fL (ref 78.0–100.0)
Monocytes Absolute: 1.3 10*3/uL — ABNORMAL HIGH (ref 0.1–1.0)
Monocytes Relative: 8 %
Neutro Abs: 13.2 10*3/uL — ABNORMAL HIGH (ref 1.7–7.7)
Neutrophils Relative %: 85 %
Platelets: 99 10*3/uL — ABNORMAL LOW (ref 150–400)
RBC: 2.81 MIL/uL — ABNORMAL LOW (ref 3.87–5.11)
RDW: 15.5 % (ref 11.5–15.5)
WBC: 15.5 10*3/uL — ABNORMAL HIGH (ref 4.0–10.5)

## 2016-06-16 LAB — POCT I-STAT, CHEM 8
BUN: 28 mg/dL — ABNORMAL HIGH (ref 6–20)
Calcium, Ion: 1.22 mmol/L (ref 1.15–1.40)
Chloride: 104 mmol/L (ref 101–111)
Creatinine, Ser: 1.3 mg/dL — ABNORMAL HIGH (ref 0.44–1.00)
Glucose, Bld: 117 mg/dL — ABNORMAL HIGH (ref 65–99)
HCT: 27 % — ABNORMAL LOW (ref 36.0–46.0)
Hemoglobin: 9.2 g/dL — ABNORMAL LOW (ref 12.0–15.0)
Potassium: 3.6 mmol/L (ref 3.5–5.1)
Sodium: 137 mmol/L (ref 135–145)
TCO2: 21 mmol/L (ref 0–100)

## 2016-06-16 LAB — MAGNESIUM: Magnesium: 1.7 mg/dL (ref 1.7–2.4)

## 2016-06-16 MED ORDER — SODIUM CHLORIDE 0.9% FLUSH: 3.0000 mL | INTRAVENOUS | Status: DC | PRN

## 2016-06-16 MED ORDER — DEXTROSE 50 % IV SOLN
INTRAVENOUS | Status: AC
  Administered 2016-06-16: 25 mL
  Filled 2016-06-16: qty 50

## 2016-06-16 MED ORDER — TIOTROPIUM BROMIDE MONOHYDRATE 18 MCG IN CAPS
18.0000 ug | ORAL_CAPSULE | Freq: Every day | RESPIRATORY_TRACT | Status: DC
  Administered 2016-06-16 – 2016-06-18 (×3): 18 ug via RESPIRATORY_TRACT
  Filled 2016-06-16: qty 5

## 2016-06-16 MED ORDER — MAGNESIUM SULFATE 2 GM/50ML IV SOLN
2.0000 g | Freq: Once | INTRAVENOUS | Status: AC
  Administered 2016-06-16: 2 g via INTRAVENOUS
  Filled 2016-06-16: qty 50

## 2016-06-16 MED ORDER — SODIUM CHLORIDE 0.9% FLUSH
3.0000 mL | Freq: Two times a day (BID) | INTRAVENOUS | Status: DC
  Administered 2016-06-17 – 2016-06-19 (×4): 3 mL via INTRAVENOUS

## 2016-06-16 NOTE — Progress Notes (Signed)
Physical Therapy Treatment Patient Details Name: Kayla Sawyer MRN: [id number] DOB: [birth year] Today's Date: 06/16/2016    History of Present Illness 70 y.o. with known history of COPD and pulmonary hypertension. Patient with known history of peripheral vascular disease and bilateral lower extremity claudication. Does have a long-standing history of ongoing tobacco use. Patient underwent aortobifemoral bypass graft as well as aorto SMA bypass graft reimplantation of superior mesenteric artery on 11/29.    PT Comments    Patient limited to bed to [facility] this session due to HR elevation.  Seen with OT, but pt with improved tolerance and less assist for mobility.  Patient will benefit from continued skilled PT in the acute setting and follow up HHPT with family support upon d/c.   Follow Up Recommendations  Home health PT;Supervision/Assistance - 24 hour     Equipment Recommendations  None recommended by PT    Recommendations for Other Services       Precautions / Restrictions Precautions Precautions: Fall Restrictions Weight Bearing Restrictions: No    Mobility  Bed Mobility Overal bed mobility: Needs Assistance Bed Mobility: Rolling;Sidelying to Sit Rolling: Min assist Sidelying to sit: Mod assist   Sit to supine: Mod assist   General bed mobility comments: cues for technique, assist for legs  Transfers Overall transfer level: Needs assistance Equipment used: Rolling walker (2 wheeled) Transfers: Sit to/from [employer] Transfers Sit to Stand: Min assist;+2 safety/equipment Stand pivot transfers: Min assist;+2 safety/equipment       General transfer comment: assist for lifting, assist for multiple lines  Ambulation/Gait             General Gait Details: unable due to HR in 150's   Stairs            Wheelchair Mobility    Modified Rankin (Stroke Patients Only)       Balance Overall balance assessment: Needs assistance   Sitting  balance-Leahy Scale: Fair       Standing balance-Leahy Scale: Poor Standing balance comment: needs UE support for balance                    Cognition Arousal/Alertness: Awake/alert Behavior During Therapy: WFL for tasks assessed/performed Overall Cognitive Status: Within Functional Limits for tasks assessed                      Exercises      General Comments General comments (skin integrity, edema, etc.): HR up to 155 with OOB on BSC, BP 90/75, HR resting 116      Pertinent Vitals/Pain Pain Assessment: 0-10 Pain Score: 4  Pain Location: abdomen Pain Descriptors / Indicators: Discomfort;Operative site guarding Pain Intervention(s): Monitored during session;Limited activity within patient's tolerance    Home Living Family/patient expects to be discharged to:: Private residence Living Arrangements: Spouse/significant other Available Help at Discharge: Family;Available 24 hours/day Type of Home: House Home Access: Stairs to enter Entrance Stairs-Rails: None Home Layout: Multi-level Home Equipment: Environmental consultant - 4 wheels;Bedside commode      Prior Function Level of Independence: Independent      Comments: used rollator for community   PT Goals (current goals can now be found in the care plan section) Acute Rehab PT Goals Patient Stated Goal: To return to independent Progress towards PT goals: Progressing toward goals (slowly)    Frequency    Min 3X/week      PT Plan Current plan remains appropriate    Co-evaluation   Reason for Co-Treatment:  Complexity of the patient's impairments (multi-system involvement);For patient/therapist safety   OT goals addressed during session: ADL's and self-care     End of Session Equipment Utilized During Treatment: Oxygen Activity Tolerance: Patient limited by pain;Treatment limited secondary to medical complications (Comment) (elevated HR) Patient left: in bed;with call bell/phone within reach;with  family/visitor present     Time: 1140-1210 PT Time Calculation (min) (ACUTE ONLY): 30 min  Charges:  $Therapeutic Activity: 8-22 mins                    G Codes:      Reginia Naas 2016/07/10, 1:02 PM  Magda Kiel, [place] 07-10-2016

## 2016-06-16 NOTE — Progress Notes (Signed)
Echocardiogram 2D Echocardiogram has been performed.  Kayla Sawyer 06/16/2016, 11:24 AM

## 2016-06-16 NOTE — Progress Notes (Signed)
Occupational Therapy Evaluation Patient Details Name: Kayla Sawyer MRN: [id number] DOB: Jan 30, 1946 Today's Date: 06/16/2016    History of Present Illness 70 y.o. with known history of COPD and pulmonary hypertension. Patient with known history of peripheral vascular disease and bilateral lower extremity claudication. Does have a long-standing history of ongoing tobacco use. Patient underwent aortobifemoral bypass graft as well as aorto SMA bypass graft reimplantation of superior mesenteric artery on 11/29.   Clinical Impression   PTA, pt independent with mobility and ADL and used rollator for community distance. Eval limited today by sustained HR 155 with toilet transfer. BP 90/75 in sitting. O2 89-96 2L. Will follow acutely to facilitate safe DC home with 24/7 S and HHOT.     Follow Up Recommendations  Home health OT;Supervision/Assistance - 24 hour    Equipment Recommendations  Tub/shower bench    Recommendations for Other Services       Precautions / Restrictions Precautions Precautions: Fall Restrictions Weight Bearing Restrictions: No      Mobility Bed Mobility Overal bed mobility: Needs Assistance Bed Mobility: Supine to Sit;Sit to Supine Rolling: Min assist Sidelying to sit: Mod assist   Sit to supine: Mod assist   General bed mobility comments: vc to roll to side to reduce discomfort  Transfers Overall transfer level: Needs assistance Equipment used: Rolling walker (2 wheeled) Transfers: Sit to/from [facility] Sit to Stand: Min assist;+2 safety/equipment Stand pivot transfers: Min assist;+2 safety/equipment            Balance Overall balance assessment: Needs assistance   Sitting balance-Leahy Scale: Fair       Standing balance-Leahy Scale: Poor                              ADL Overall ADL's : Needs assistance/impaired     Grooming: Set up   Upper Body Bathing: Minimal assitance;Sitting   Lower Body Bathing:  Moderate assistance;Sit to/from stand   Upper Body Dressing : Minimal assistance;Sitting   Lower Body Dressing: Moderate assistance;Sit to/from stand   Toilet Transfer: Minimal assistance;Stand-pivot;+2 for safety/equipment   Toileting- Clothing Manipulation and Hygiene: Total assistance;Sit to/from stand       Functional mobility during ADLs: Minimal assistance;+2 for safety/equipment General ADL Comments: Limited mobility due to HR     Vision     Perception     Praxis      Pertinent Vitals/Pain Pain Assessment: 0-10 Pain Score: 4  Pain Location: abdomen Pain Descriptors / Indicators: Aching;Discomfort Pain Intervention(s): Limited activity within patient's tolerance  HR 119 initially. Elevated to 155 -sustained; BP 90/75. O2 89 -96 2L with activity     Hand Dominance Right   Extremity/Trunk Assessment Upper Extremity Assessment Upper Extremity Assessment: Generalized weakness   Lower Extremity Assessment Lower Extremity Assessment: Defer to PT evaluation;Generalized weakness   Cervical / Trunk Assessment Cervical / Trunk Assessment: Normal   Communication Communication Communication: No difficulties   Cognition Arousal/Alertness: Awake/alert Behavior During Therapy: WFL for tasks assessed/performed Overall Cognitive Status: Within Functional Limits for tasks assessed                     General Comments       Exercises       Shoulder Instructions      Home Living Family/patient expects to be discharged to:: Private residence Living Arrangements: Spouse/significant other Available Help at Discharge: Family;Available 24 hours/day Type of Home: House Home Access:  Stairs to enter [employer] of Steps: 2 Entrance Stairs-Rails: None Home Layout: Multi-level Alternate Level Stairs-Number of Steps: 2 steps up to den, then 2 down to another den Alternate Level Stairs-Rails: None Bathroom Shower/Tub: Tub/shower unit Shower/tub  characteristics: Architectural technologist: Standard Bathroom Accessibility: Yes How Accessible: Accessible via walker Home Equipment: [place] - 4 wheels;Bedside commode          Prior Functioning/Environment Level of Independence: Independent        Comments: used rollator for community        OT Problem List: Decreased strength;Decreased range of motion;Decreased activity tolerance;Impaired balance (sitting and/or standing);Decreased knowledge of use of DME or AE;Cardiopulmonary status limiting activity;Pain   OT Treatment/Interventions: Self-care/ADL training;Therapeutic activities;DME and/or AE instruction;Patient/family education;Cognitive remediation/compensation    OT Goals(Current goals can be found in the care plan section) Acute Rehab OT Goals Patient Stated Goal: To return to independent OT Goal Formulation: With patient Time For Goal Achievement: 06/30/16 Potential to Achieve Goals: Good ADL Goals Pt Will Perform Upper Body Bathing: with set-up;with supervision;sitting Pt Will Perform Lower Body Bathing: with set-up;with supervision;with caregiver independent in assisting;sit to/from stand Pt Will Perform Lower Body Dressing: with set-up;with supervision;sit to/from stand Pt Will Transfer to Toilet: with supervision;bedside commode;ambulating Pt Will Perform Toileting - Clothing Manipulation and hygiene: with supervision;sit to/from stand Additional ADL Goal #1: Pt will verbalize 3 energy conservation techniques for ADL with min vc  OT Frequency: Min 2X/week   Barriers to D/C:            Co-evaluation PT/OT/SLP Co-Evaluation/Treatment: Yes Reason for Co-Treatment: Complexity of the patient's impairments (multi-system involvement);For patient/therapist safety   OT goals addressed during session: ADL's and self-care      End of Session Equipment Utilized During Treatment: Rolling walker;Oxygen (2L) Nurse Communication: Mobility status;Other (comment)  (HR)  Activity Tolerance: Treatment limited secondary to medical complications (Comment) (sustained HR 155) Patient left: in bed;with call bell/phone within reach;with family/visitor present;with SCD's reapplied   Time: 1145-1210 OT Time Calculation (min): 25 min Charges:  OT General Charges $OT Visit: 1 Procedure OT Evaluation $OT Eval Moderate Complexity: 1 Procedure G-Codes:    Ellason Segar,HILLARY 06/21/2016, 12:52 PM   [facility], OTR/L  [phone] Jun 21, 2016

## 2016-06-16 NOTE — Progress Notes (Signed)
PULMONARY / CRITICAL CARE MEDICINE   Name: Kayla Sawyer MRN: [id number] DOB: 11/18/1945    ADMISSION DATE:  06/14/2016 CONSULTATION DATE:  06/15/2016  REFERRING MD:  Harold Barban, M.D. / Vascular Surgery  CHIEF COMPLAINT:  Acute Respiratory Failure  SUMMARY:  70 y.o. with known history of COPD and pulmonary hypertension. Patient with known history of peripheral vascular disease and bilateral lower extremity claudication. Long-standing history of ongoing tobacco use. Patient underwent aortobifemoral bypass graft as well as aorto SMA bypass graft reimplantation of superior mesenteric artery on 11/29. Post-op was hypotensive with vasopressor requirement. Urine output has remained stable. Extubated 11/30 without difficulty.    SUBJECTIVE:  PT reports feeling much better today than yesterday.  Denies SOB, abd pain.  Reports cough with sputum production.  RN reports tachycardia since last PM.  Fluid challenge overnight without change in rate.  Not on any home medications.    VITAL SIGNS: BP (!) 113/59   Pulse (!) 125   Temp 97.9 F (36.6 C) (Oral)   Resp 18   Ht '5\' 8"'$  (1.727 m)   Wt 141 lb 15.6 oz (64.4 kg)   SpO2 97%   BMI 21.59 kg/m   HEMODYNAMICS: CVP:  [7 mmHg] 7 mmHg  VENTILATOR SETTINGS:    INTAKE / OUTPUT: I/O last 3 completed shifts: In: 0175 [I.V.:5045; NG/GT:150; IV Piggyback:750] Out: 1998 [Urine:1495; Emesis/NG output:500; Stool:3]  PHYSICAL EXAMINATION: General:  Adult female, appears older than stated age Integument:  Warm & dry. No rash on exposed skin. Abdominal and inguinal incisions with mild surrounding bruising but otherwise clean, dry, and intact without erythema. HEENT:  Moist mucus membranes. No scleral injection or icterus.   Cardiovascular:  Regular rate. No edema. No appreciable JVD.  Pulmonary:  Clear to auscultation bilaterally.   Abdomen: Soft. Normal bowel sounds. Nondistended.  Musculoskeletal:  Normal bulk and tone. No joint deformity or  effusion appreciated. Neurological:  AAOx4, speech clear, MAE   LABS:  BMET  Recent Labs Lab 06/15/16 0500 06/15/16 1803 06/15/16 2325 06/16/16 0400  NA 135 135 137 136  K 4.4 3.5 3.6 3.6  CL 109 107 104 108  CO2 19* 20*  --  21*  BUN 25* 28* 28* 29*  CREATININE 1.38* 1.26* 1.30* 1.18*  GLUCOSE 304* 134* 117* 116*    Electrolytes  Recent Labs Lab 06/14/16 1610 06/15/16 0500 06/15/16 1803 06/16/16 0400  CALCIUM 10.3 8.3* 8.1* 7.9*  MG 1.1* 1.7  --  1.7  PHOS  --   --   --  3.9    CBC  Recent Labs Lab 06/14/16 1610 06/15/16 0500 06/15/16 2325 06/16/16 0400  WBC 13.0* 16.8*  --  15.5*  HGB 11.2* 11.0* 9.2* 8.6*  HCT 33.1* 31.9* 27.0* 24.8*  PLT 109* 115*  --  99*    Coag's  Recent Labs Lab 06/14/16 1245 06/14/16 1610  APTT >200* 37*  INR 1.37 1.63    Sepsis Markers  Recent Labs Lab 06/15/16 0628 06/15/16 1030 06/15/16 1802  LATICACIDVEN 1.9 1.4 1.3    ABG  Recent Labs Lab 06/14/16 1450 06/14/16 1611 06/15/16 0417  PHART 7.410 7.269* 7.285*  PCO2ART 40.3 54.0* 38.2  PO2ART 178.0* 144.0* 87.0    Liver Enzymes  Recent Labs Lab 06/15/16 0500 06/16/16 0400  AST 29  --   ALT 14  --   ALKPHOS 24*  --   BILITOT 0.5  --   ALBUMIN 2.4* 1.9*    Cardiac Enzymes  Recent Labs Lab 06/15/16  9562 06/15/16 1050 06/15/16 1803  TROPONINI <0.03 <0.03 <0.03    Glucose  Recent Labs Lab 06/15/16 1145 06/15/16 1518 06/15/16 1914 06/16/16 0003 06/16/16 0420 06/16/16 0729  GLUCAP 143* 120* 121* 121* 117* 107*    Imaging Dg Chest Port 1 View  Result Date: 06/16/2016 CLINICAL DATA:  Acute respiratory failure with hypoxia. History of COPD, pneumonia, sepsis, pulmonary hypertension, current smoker. EXAM: PORTABLE CHEST 1 VIEW COMPARISON:  Portable chest x-ray of June 15, 2016 FINDINGS: The lungs are well-expanded. There are increased densities at the lung bases more conspicuous today. The retrocardiac region on the left is more  dense than the hemidiaphragm is now totally obscured. The heart is normal in size. The pulmonary vascularity is not engorged. There is calcification in the wall of the aortic arch. The esophagogastric tube tip projects below the inferior margin of the image. IMPRESSION: Interval development of left lower lobe atelectasis or pneumonia with small left pleural effusion. Slight interval increase in suspected subsegmental atelectasis at the right lung base. Thoracic aortic atherosclerosis. Electronically Signed   By: David  Martinique M.D.   On: 06/16/2016 08:06   Dg Chest Port 1 View  Result Date: 06/15/2016 CLINICAL DATA:  Pulmonary edema EXAM: PORTABLE CHEST 1 VIEW COMPARISON:  Earlier same day exam performed at 0534 hours FINDINGS: Heart is top-normal in size. There is aortic atherosclerosis. There is mild upper lobe hyperinflation bilaterally with slight crowding of lower lobe interstitial lung markings. There is slight increase in interstitial edema relative to comparison study. There is left basilar atelectasis. Slight improvement in aeration at the left lung base. Gastric tube extends into the expected location of the stomach although the tip is excluded on this study. IMPRESSION: Mild bilateral upper lobe hyperinflation. Retrocardiac opacity may represent a small layering left effusion. There is left basilar atelectasis. Slight improvement in aeration noted at the left lung base. Slight increase in interstitial edema. Electronically Signed   By: Ashley Royalty M.D.   On: 06/15/2016 19:09     STUDIES:  TTE 01/03/16:  LV normal in size with EF 60-65% without regional wall motion abnormalities and with grade 1 diastolic dysfunction. LA & RA normal in size. RV normal in size and function. Pulmonary artery systolic pressure 27 mmHg. No aortic stenosis or regurgitation with poorly visualized valve. Aortic root normal in size. Trivial mitral regurgitation. Trivial pulmonic regurgitation. Trivial tricuspid  regurgitation. No pericardial effusion. LHC 01/03/16:  Prox RCA to Mid RCA lesion, 30% stenosed.  Mid RCA to Dist RCA lesion, 20% stenosed.  Mid LAD lesion, 40% stenosed.  2nd Diag lesion, 50% stenosed.  Prox LAD to Mid LAD lesion, 20% stenosed. [place] 01/03/16: RA = 1 RV = 38/0/4 PA = 40/11 (26) PCW = 7 Fick cardiac output/index = 3.5/2.0 PVR = 5.0 WU Ao sat = 90% PA sat = 58%, 58%  ECHO 11/30 >>   MICROBIOLOGY: MRSA PCR 11/21:  Negative  ANTIBIOTICS: Zinacef 11/29 (surgical prophylaxis)  SIGNIFICANT EVENTS: 11/29 - Admit & aortobifemoral bypass graft along with aorta SMA bypass graft re-implantation of superior mesenteric artery 11/30 - Extubated   LINES/TUBES: OETT 7.5 11/29 >> 11/30 R IJ CVL 11/29 >> 11/30 L RADIAL ART LINE 11/29 >> 12/1 FOLEY 11/29 >> 12/1 OGT 11/29 >> 12/1 PIV  DISCUSSION:  70 y.o. female s/p aortobifemoral bypass graft along with aorta SMA bypass graft re-implantation of superior mesenteric artery on 11/29. Known history of pulmonary hypertension. Required low-dose Neo-Synephrine and remained intubated postoperatively.  Extubated 11/30 w/o difficulty.  ASSESSMENT / PLAN:  PULMONARY A: Acute Hypoxic Respiratory Failure - Post-op. LLL Atelectasis  H/O Moderate-Severe COPD w/ Emphysema - FEV1 1.28 L (53%) on 12/09/15. Possible Pulmonary Hypertension - Not present on RHC 01/03/16. ILD - Follows with Dr. Chase Caller. Possible UIP pattern in bases. Possibly secondary to scleroderma.  Tobacco Use Disorder  P:   Pulmonary hygiene - IS, flutter, mobilize Monitor LLL, fever curve, WBC  Intermittent CXR  Will resume home spiriva & D/C scheduled BD's as may be contributing to tachycardia Xopenex prn wheezing  CARDIOVASCULAR A:  Tachycardia - ? Related to pain/anxiety.  Home meds reviewed, no BP/rate control meds.   Hypotension - Possibly related to sedation. Peripheral Vascular Disease - S/P aortobifemoral bypass graft along with aorta SMA  bypass graft re-implantation of superior mesenteric artery 11/29. Non-obstructive Coronary Artery Disease - Based on [facility] 01/03/16. Hypercholesterolemia  P:  Continuous telemetry monitoring Vitals per unit protocol Await ECHO  Monitor tachycardia   RENAL A:   Acute Renal Failure - Mild. UOP good. Hyperchloremia - Resolved. Hypomagnesemia - Replaced. NAGMA  P:   Trending UOP with Foley Monitor electrolytes and renal function daily Replace electrolytes as indicated Reduce LR to 50cc/hr  GASTROINTESTINAL A:   No acute issues.  P:   Ice chips only  Likely will start diet in am (per VVS) Pepcid IV q12hr, stop date added   HEMATOLOGIC A:   Thrombocytopenia - Stable. Anemia - Mild. No signs of active bleeding.  P:  Trend cell counts daily w/ CBC SCDs Plan to start chemical DVT prophylaxis when ok with Vascular Surgery  INFECTIOUS A:   No acute infection.  P:   Zinacef Surgical Prophylaxis Monitor for signs of infection  ENDOCRINE A:   Hyperglycemia - No h/o DM. Likely due to  Dextrose IVF.  Hgb A1C 5.5  P:   Accu-Checks q4hr SSI per Sensitive Algorithm  NEUROLOGIC A:   Sedation on Ventilator - resolved Post-op Pain Control H/O Depression  P:   RASS goal: n/a   Fentanyl IV prn Pain Zoloft home dosing  RHEUMATOLOGICAL A: Possible Scleroderma - Follows with Dr. Gavin Pound.  P: No medications currently  FAMILY  - Updates:  Family updated at bedside on plan of care.  Daughter in law is an Therapist, sports in [employer] family meet or Palliative Care meeting due by:  12/7    Agree the patient could move out of ICU this evening or in am.  Will defer to VVS.    PCCM will be available PRN.  Please call if new needs arise.    Noe Gens, NP-C  Pulmonary & Critical Care Pgr: [phone] or if no answer [phone] 06/16/2016, 10:04 AM      STAFF NOTE: I, Merrie Roof, MD FACP have personally reviewed patient's available data,  including medical history, events of note, physical examination and test results as part of my evaluation. I have discussed with resident/NP and other care providers such as pharmacist, RN and RRT. In addition, I personally evaluated patient and elicited key findings of: awake in bed, no distress, crackles bases, pcxr with edema and ATX, would favor lasix soon to neg balance, IS, diet per VVS, not impressed for pNA or infection, call if needed   Lavon Paganini. Titus Mould, MD, [place] Pgr: [facility] 06/16/2016 2:16 PM

## 2016-06-16 NOTE — Progress Notes (Signed)
OT Cancellation Note  Patient Details Name: Kayla Sawyer MRN: [id number] DOB: Mar 25, 1946   Cancelled Treatment:    Reason Eval/Treat Not Completed: Patient at procedure or test/ unavailable (echo) Will attempt later.  [place], OTR/L  [phone] 06/16/2016 06/16/2016, 11:01 AM

## 2016-06-16 NOTE — Care Management Note (Signed)
Case Management Note  Patient Details  Name: Kayla Sawyer MRN: [id number] Date of Birth: 02/02/1946  Subjective/Objective:     Pt s/p Aortobifemoral bypass graft (14 x 7)                         #2: Aorta to SMA bypass graft (7 mm interposition dacryon graft                         #3: Reimplantation of the inferior mesenteric artery               Action/Plan:  PTA from home with husband.  Pt states she uses rollator in the home and has shower chair.  PT ordered.   CM will continue to follow for discharge needs   Expected Discharge Date:                  Expected Discharge Plan:  [place]  In-House Referral:     Discharge planning Services  CM Consult  Post Acute Care Choice:    Choice offered to:     DME Arranged:    DME Agency:     HH Arranged:    HH Agency:     Status of Service:  In process, will continue to follow  If discussed at Long Length of Stay Meetings, dates discussed:    Additional Comments: 06/16/2016 PT has placed recommendations on patients chart - CM requested orders via physician sticky tab.  Pts husband will provide recommended supervision  Maryclare Labrador, RN 06/16/2016, 9:44 AM

## 2016-06-16 NOTE — Progress Notes (Signed)
[facility] Progress Note  Subjective  - POD #2  Feels ok. Some intermittent nausea. Reports BM and flatus. No breathing issues.   Objective Vitals:   06/16/16 0615 06/16/16 0700  BP:  136/72  Pulse: (!) 162 (!) 116  Resp: (!) 31 20  Temp:  97.9 F (36.6 C)    Intake/Output Summary (Last 24 hours) at 06/16/16 0816 Last data filed at 06/16/16 0800  Gross per 24 hour  Intake          3392.37 ml  Output             1438 ml  Net          1954.37 ml   Tachycardic Lungs clear Abdomen soft and nontender, minimal bowel sounds Midline and groin incisions clean with some ecchymosis AT and PT doppler flow bilaterally Legs without edema  Assessment/Planning: 70 y.o. female is s/p: aortobifemoral bypass, aorta to SMA bypass graft, re-implantation of IMA 2 Days Post-Op   Pulm: extubated yesterday, breathing comfortably on 2L, CXR with atelectasis.  Continue incentive spirometry, flutter valve, prn nebs CV: tachycardic 110s-120s likely secondary to pain, off neo this am. SBP stable in 120s GI: no NG output last shift, will d/c NG tube. Had BM this am, but minimal bowel sounds present with some nausea still. Will keep NPO except for ice chips. PPI for stress ulcer prophylaxis.  Heme: Hgb stable, transfuse <7.  Renal: UOP ok, ARF improving creatinine 1.18. Continue IVF. Endocrine: continue SSI ID: leukocytosis still likely reactive from surgery. No abx indicated.   Mobilize today. D/c a-line. D/c foley. Can transfer out of ICU. Dr. Oneida Alar to see today.  Appreciate PCCM assistance with this patient.   Alvia Grove 06/16/2016 8:16 AM -- Agree with above.  NG out Foley out if moving around better Transfer out of ICU later today if a little more mobile  Ruta Hinds, MD [facility] [place]: [phone] Pager: [phone]  Laboratory CBC    Component Value Date/Time   WBC 15.5 (H) 06/16/2016 0400   HGB 8.6 (L)  06/16/2016 0400   HCT 24.8 (L) 06/16/2016 0400   PLT 99 (L) 06/16/2016 0400    BMET    Component Value Date/Time   NA 136 06/16/2016 0400   K 3.6 06/16/2016 0400   CL 108 06/16/2016 0400   CO2 21 (L) 06/16/2016 0400   GLUCOSE 116 (H) 06/16/2016 0400   BUN 29 (H) 06/16/2016 0400   CREATININE 1.18 (H) 06/16/2016 0400   CREATININE 0.90 02/08/2016 0949   CALCIUM 7.9 (L) 06/16/2016 0400   GFRNONAA 46 (L) 06/16/2016 0400   GFRAA 53 (L) 06/16/2016 0400    COAG Lab Results  Component Value Date   INR 1.63 06/14/2016   INR 1.37 06/14/2016   INR 1.03 06/06/2016   No results found for: PTT  Antibiotics Anti-infectives    Start     Dose/Rate Route Frequency Ordered Stop   06/14/16 2030  cefUROXime (ZINACEF) 1.5 g in dextrose 5 % 50 mL IVPB     1.5 g 100 mL/hr over 30 Minutes Intravenous Every 12 hours 06/14/16 1545 06/15/16 0756   06/14/16 1215  cefUROXime (ZINACEF) 1.5 g in dextrose 5 % 50 mL IVPB  Status:  Discontinued     1.5 g 100 mL/hr over 30 Minutes Intravenous  Once 06/14/16 1212 06/14/16 1542   06/14/16 0644  cefUROXime (ZINACEF) 1.5 g in dextrose 5 % 50 mL IVPB  1.5 g 100 mL/hr over 30 Minutes Intravenous 30 min pre-op 06/14/16 0644 06/14/16 1230   06/14/16 0554  dextrose 5 % with cefUROXime (ZINACEF) ADS Med    Comments:  Beverly Gust   : cabinet override      06/14/16 0554 06/14/16 0830       Virgina Jock, PA-C [facility] Office: [phone] Pager: [phone] 06/16/2016 8:16 AM

## 2016-06-17 LAB — GLUCOSE, CAPILLARY
Glucose-Capillary: 80 mg/dL (ref 65–99)
Glucose-Capillary: 87 mg/dL (ref 65–99)
Glucose-Capillary: 77 mg/dL (ref 65–99)
Glucose-Capillary: 80 mg/dL (ref 65–99)
Glucose-Capillary: 81 mg/dL (ref 65–99)
Glucose-Capillary: 78 mg/dL (ref 65–99)

## 2016-06-17 LAB — CBC
HCT: 20.8 % — ABNORMAL LOW (ref 36.0–46.0)
Hemoglobin: 7.2 g/dL — ABNORMAL LOW (ref 12.0–15.0)
MCH: 30.8 pg (ref 26.0–34.0)
MCHC: 34.6 g/dL (ref 30.0–36.0)
MCV: 88.9 fL (ref 78.0–100.0)
Platelets: 101 10*3/uL — ABNORMAL LOW (ref 150–400)
RBC: 2.34 MIL/uL — ABNORMAL LOW (ref 3.87–5.11)
RDW: 15.5 % (ref 11.5–15.5)
WBC: 12.4 10*3/uL — ABNORMAL HIGH (ref 4.0–10.5)

## 2016-06-17 LAB — BASIC METABOLIC PANEL
Anion gap: 8 (ref 5–15)
BUN: 23 mg/dL — ABNORMAL HIGH (ref 6–20)
CO2: 22 mmol/L (ref 22–32)
Calcium: 7.6 mg/dL — ABNORMAL LOW (ref 8.9–10.3)
Chloride: 110 mmol/L (ref 101–111)
Creatinine, Ser: 0.98 mg/dL (ref 0.44–1.00)
GFR calc Af Amer: >60 mL/min (ref >60)
GFR calc non Af Amer: 58 mL/min — ABNORMAL LOW (ref >60)
Glucose, Bld: 75 mg/dL (ref 65–99)
Potassium: 3.3 mmol/L — ABNORMAL LOW (ref 3.5–5.1)
Sodium: 140 mmol/L (ref 135–145)

## 2016-06-17 LAB — HEMOGLOBIN AND HEMATOCRIT, BLOOD
HCT: 29.4 % — ABNORMAL LOW (ref 36.0–46.0)
Hemoglobin: 10.3 g/dL — ABNORMAL LOW (ref 12.0–15.0)

## 2016-06-17 LAB — PREPARE RBC (CROSSMATCH)

## 2016-06-17 MED ORDER — SODIUM CHLORIDE 0.9 % IV SOLN
Freq: Once | INTRAVENOUS | Status: AC
  Administered 2016-06-17: 12:00:00 via INTRAVENOUS

## 2016-06-17 NOTE — Progress Notes (Addendum)
[facility] [place]  Subjective  - Appears comfortable.  Patient reports flatus.    Objective 122/60 (!) 101 98.1 F (36.7 C) (Oral) 18 97%  Intake/Output Summary (Last 24 hours) at 06/17/16 0859 Last data filed at 06/17/16 0800  Gross per 24 hour  Intake             1350 ml  Output             1770 ml  Net             -420 ml   Lungs 2 L [place] Left groin ecchymosis Palpable PT bilaterally Abdomin soft Heart Tachy   Assessment/Planning: POD # 3 Admit & aortobifemoral bypass graft along with aorta SMA bypass graft re-implantation of superior mesenteric artery 11/30 extubated LLL Atelectasis WBC improving Cr improved 0.98, UO >1800 daily HGB 7.2 will transfuse 2 units PRBC Start clear liquids slowly  Laurence Slate [facility] 06/17/2016 8:59 AM --  Laboratory Lab Results:  Recent Labs  06/16/16 0400 06/17/16 0522  WBC 15.5* 12.4*  HGB 8.6* 7.2*  HCT 24.8* 20.8*  PLT 99* 101*   BMET  Recent Labs  06/16/16 0400 06/17/16 0522  NA 136 140  K 3.6 3.3*  CL 108 110  CO2 21* 22  GLUCOSE 116* 75  BUN 29* 23*  CREATININE 1.18* 0.98  CALCIUM 7.9* 7.6*    COAG Lab Results  Component Value Date   INR 1.63 06/14/2016   INR 1.37 06/14/2016   INR 1.03 06/06/2016   No results found for: PTT   I have independently interviewed patient and agree with PA assessment and plan above. Acute blood loss anemia with equilibration from surgery as know evidence of ongoing, transfuse 2 units and advance diet.   Brandon C. Donzetta Matters, MD [facility] [place] Office: [phone] Pager: [phone]

## 2016-06-18 LAB — CBC
HCT: 28.4 % — ABNORMAL LOW (ref 36.0–46.0)
Hemoglobin: 9.9 g/dL — ABNORMAL LOW (ref 12.0–15.0)
MCH: 30.5 pg (ref 26.0–34.0)
MCHC: 34.9 g/dL (ref 30.0–36.0)
MCV: 87.4 fL (ref 78.0–100.0)
Platelets: 99 10*3/uL — ABNORMAL LOW (ref 150–400)
RBC: 3.25 MIL/uL — ABNORMAL LOW (ref 3.87–5.11)
RDW: 15 % (ref 11.5–15.5)
WBC: 11.1 10*3/uL — ABNORMAL HIGH (ref 4.0–10.5)

## 2016-06-18 LAB — TYPE AND SCREEN
Blood Product Expiration Date: 201712112359
Blood Product Expiration Date: 201712112359
Blood Product Expiration Date: 201712142359
Blood Product Expiration Date: 201712152359
ISSUE DATE / TIME: 201711290929
ISSUE DATE / TIME: 201711290929
ISSUE DATE / TIME: 201712021107
ISSUE DATE / TIME: 201712021315
Unit Type and Rh: 5100
Unit Type and Rh: 5100
Unit Type and Rh: 5100
Unit Type and Rh: 5100

## 2016-06-18 LAB — BASIC METABOLIC PANEL
Anion gap: 9 (ref 5–15)
BUN: 18 mg/dL (ref 6–20)
CO2: 24 mmol/L (ref 22–32)
Calcium: 7.7 mg/dL — ABNORMAL LOW (ref 8.9–10.3)
Chloride: 107 mmol/L (ref 101–111)
Creatinine, Ser: 0.87 mg/dL (ref 0.44–1.00)
GFR calc Af Amer: >60 mL/min (ref >60)
GFR calc non Af Amer: >60 mL/min (ref >60)
Glucose, Bld: 87 mg/dL (ref 65–99)
Potassium: 2.8 mmol/L — ABNORMAL LOW (ref 3.5–5.1)
Sodium: 140 mmol/L (ref 135–145)

## 2016-06-18 LAB — GLUCOSE, CAPILLARY
Glucose-Capillary: 84 mg/dL (ref 65–99)
Glucose-Capillary: 77 mg/dL (ref 65–99)
Glucose-Capillary: 101 mg/dL — ABNORMAL HIGH (ref 65–99)
Glucose-Capillary: 103 mg/dL — ABNORMAL HIGH (ref 65–99)
Glucose-Capillary: 90 mg/dL (ref 65–99)
Glucose-Capillary: 101 mg/dL — ABNORMAL HIGH (ref 65–99)

## 2016-06-18 MED ORDER — ATORVASTATIN CALCIUM 20 MG PO TABS
20.0000 mg | ORAL_TABLET | Freq: Every day | ORAL | Status: DC
  Administered 2016-06-18 – 2016-06-20 (×3): 20 mg via ORAL
  Filled 2016-06-18 (×3): qty 1

## 2016-06-18 MED ORDER — POTASSIUM CHLORIDE CRYS ER 20 MEQ PO TBCR
60.0000 meq | EXTENDED_RELEASE_TABLET | Freq: Once | ORAL | Status: AC
  Administered 2016-06-18: 60 meq via ORAL

## 2016-06-18 MED ORDER — ASPIRIN EC 81 MG PO TBEC
81.0000 mg | DELAYED_RELEASE_TABLET | Freq: Every day | ORAL | Status: DC
  Administered 2016-06-18 – 2016-06-20 (×3): 81 mg via ORAL
  Filled 2016-06-18 (×3): qty 1

## 2016-06-18 MED ORDER — ADULT MULTIVITAMIN W/MINERALS CH
1.0000 | ORAL_TABLET | Freq: Every day | ORAL | Status: DC
  Administered 2016-06-18 – 2016-06-20 (×3): 1 via ORAL
  Filled 2016-06-18 (×3): qty 1

## 2016-06-18 MED ORDER — INSULIN ASPART 100 UNIT/ML ~~LOC~~ SOLN: 0.0000 [IU] | Freq: Three times a day (TID) | SUBCUTANEOUS | Status: DC

## 2016-06-18 MED ORDER — VITAMIN D 1000 UNITS PO TABS
1000.0000 [IU] | ORAL_TABLET | Freq: Every day | ORAL | Status: DC
  Administered 2016-06-18 – 2016-06-20 (×3): 1000 [IU] via ORAL
  Filled 2016-06-18 (×3): qty 1

## 2016-06-18 NOTE — Progress Notes (Signed)
Patient was transferred with no distress noted. Will transfer care at this time.

## 2016-06-18 NOTE — Progress Notes (Addendum)
[facility] [place]  Subjective  - She had a hard night and little rest.   Objective 121/63 92 98.1 F (36.7 C) (Oral) 17 95%  Intake/Output Summary (Last 24 hours) at 06/18/16 0816 Last data filed at 06/18/16 0600  Gross per 24 hour  Intake           1542.5 ml  Output             1990 ml  Net           -447.5 ml    Abdomin soft, incisions healing well without hematoma Feet are warm touch motor and sensation grossly intact Heart RRR Lungs productive cough   Assessment/Planning: POD # 4 Admit & aortobifemoral bypass graft along with aorta SMA bypass graft re-implantation of superior mesenteric artery 11/30 extubated  HGB improved after 2 units PRBC 9.9 Cr 0.87 Advance diet to full liquids, tolerated clears without N/V Restarted home PO medications Transfer to 3S today  Laurence Slate [facility] 06/18/2016 8:16 AM --  Laboratory Lab Results:  Recent Labs  06/17/16 0522 06/17/16 1633 06/18/16 0221  WBC 12.4*  --  11.1*  HGB 7.2* 10.3* 9.9*  HCT 20.8* 29.4* 28.4*  PLT 101*  --  99*   BMET  Recent Labs  06/17/16 0522 06/18/16 0221  NA 140 140  K 3.3* 2.8*  CL 110 107  CO2 22 24  GLUCOSE 75 87  BUN 23* 18  CREATININE 0.98 0.87  CALCIUM 7.6* 7.7*    COAG Lab Results  Component Value Date   INR 1.63 06/14/2016   INR 1.37 06/14/2016   INR 1.03 06/06/2016   No results found for: PTT   I have independently interviewed patient and agree with PA assessment and plan above. Responded to transfusion with improvement in HR and overall appearance. Transfer out of icu and slowly advance diet.   Alga Southall C. Donzetta Matters, MD [facility] [place] Office: [phone] Pager: [phone]

## 2016-06-19 LAB — GLUCOSE, CAPILLARY
Glucose-Capillary: 108 mg/dL — ABNORMAL HIGH (ref 65–99)
Glucose-Capillary: 116 mg/dL — ABNORMAL HIGH (ref 65–99)
Glucose-Capillary: 88 mg/dL (ref 65–99)
Glucose-Capillary: 91 mg/dL (ref 65–99)

## 2016-06-19 LAB — BASIC METABOLIC PANEL
Anion gap: 9 (ref 5–15)
BUN: 16 mg/dL (ref 6–20)
CO2: 24 mmol/L (ref 22–32)
Calcium: 7.7 mg/dL — ABNORMAL LOW (ref 8.9–10.3)
Chloride: 107 mmol/L (ref 101–111)
Creatinine, Ser: 0.76 mg/dL (ref 0.44–1.00)
GFR calc Af Amer: >60 mL/min (ref >60)
GFR calc non Af Amer: >60 mL/min (ref >60)
Glucose, Bld: 77 mg/dL (ref 65–99)
Potassium: 3.3 mmol/L — ABNORMAL LOW (ref 3.5–5.1)
Sodium: 140 mmol/L (ref 135–145)

## 2016-06-19 LAB — CBC
HCT: 30.4 % — ABNORMAL LOW (ref 36.0–46.0)
Hemoglobin: 10.4 g/dL — ABNORMAL LOW (ref 12.0–15.0)
MCH: 30.4 pg (ref 26.0–34.0)
MCHC: 34.2 g/dL (ref 30.0–36.0)
MCV: 88.9 fL (ref 78.0–100.0)
Platelets: 148 10*3/uL — ABNORMAL LOW (ref 150–400)
RBC: 3.42 MIL/uL — ABNORMAL LOW (ref 3.87–5.11)
RDW: 15.3 % (ref 11.5–15.5)
WBC: 11.2 10*3/uL — ABNORMAL HIGH (ref 4.0–10.5)

## 2016-06-19 LAB — C DIFFICILE QUICK SCREEN W PCR REFLEX
C Diff antigen: NEGATIVE
C Diff interpretation: NOT DETECTED
C Diff toxin: NEGATIVE

## 2016-06-19 MED ORDER — OXYCODONE-ACETAMINOPHEN 5-325 MG PO TABS
1.0000 | ORAL_TABLET | ORAL | Status: DC | PRN
  Administered 2016-06-19 – 2016-06-20 (×3): 1 via ORAL
  Filled 2016-06-19 (×3): qty 1

## 2016-06-19 NOTE — Care Management Note (Signed)
Case Management Note  Patient Details  Name: Kayla Sawyer MRN: [id number] Date of Birth: 1946/06/12  Subjective/Objective:     Pod 5 fem pop bypass, from home with spouse, she has a rolling walker , a rollator and a bsc.  Per pt /ot eval rec HHPT/HHOT. Patient and daughter Kayla Sawyer  [phone]  chose [facility] , referral made to [place] with [facility]. Soc will begin 24-48 hrs post dc.  Patient states she may need a shower chair, NCM informed her that medicaid pays for a shower chair, so she could get one at [facility] for probably a lower price, she states she will look into getting it from [employer]. NCM will cont to follow for dc needs.      12/4 [place], BSN - NCM received call from [place] with [facility] , she states the patient insurance is out of network for them.  NCM contacted patient an daughter n law in room , informed her that [facility] is out of network.  She then chose [place], referral given to [place] with [facility].  She states she will check the address and call me back.  Edwina called back and states yes they can take patient .                 Action/Plan:   Expected Discharge Date:                  Expected Discharge Plan:  [place]  In-House Referral:     Discharge planning Services  CM Consult  Post Acute Care Choice:  Home Health Choice offered to:  Patient  DME Arranged:    DME Agency:     HH Arranged:  PT, OT HH Agency:  [place]  Status of Service:  Completed, signed off  If discussed at [place] of Stay Meetings, dates discussed:    Additional Comments:  Kayla Mayo, RN 06/19/2016, 11:45 AM

## 2016-06-19 NOTE — Progress Notes (Signed)
Physical Therapy Treatment Patient Details Name: Kayla Sawyer MRN: [id number] DOB: [birth year] Today's Date: 06/19/2016    History of Present Illness 70 y.o. with known history of COPD and pulmonary hypertension. Patient with known history of peripheral vascular disease and bilateral lower extremity claudication. Does have a long-standing history of ongoing tobacco use. Patient underwent aortobifemoral bypass graft as well as aorto SMA bypass graft reimplantation of superior mesenteric artery on 11/29.    PT Comments    Pt very labored with mobility and continues to has urge for BM without warning, however not successful with BM.  Pt at this time is planning for D/C to home, however if unable to progress mobility, may need to consider SNF for further rehab prior to returning to home with family.  Will continue to follow.    Follow Up Recommendations  Home health PT;Supervision/Assistance - 24 hour     Equipment Recommendations  None recommended by PT    Recommendations for Other Services       Precautions / Restrictions Precautions Precautions: Fall Restrictions Weight Bearing Restrictions: No    Mobility  Bed Mobility Overal bed mobility: Needs Assistance Bed Mobility: Rolling;Sidelying to Sit Rolling: Min assist Sidelying to sit: Mod assist       General bed mobility comments: cues for log roll technique for minimizing abdominal discomfort.  A with bringing trunk up to sitting and scooting hips closer to EOB.    Transfers Overall transfer level: Needs assistance Equipment used: Rolling walker (2 wheeled) Transfers: Sit to/from Stand Sit to Stand: Min assist         General transfer comment: cues for UE use and balance with coming to standing.    Ambulation/Gait Ambulation/Gait assistance: Min assist Ambulation Distance (Feet): 15 Feet Assistive device: Rolling walker (2 wheeled) Gait Pattern/deviations: Step-through pattern;Decreased stride length;Trunk flexed     General Gait Details: pt very labored with ambulating in room and indicates fatigued after only ambulating 15'.     Stairs            Wheelchair Mobility    Modified Rankin (Stroke Patients Only)       Balance Overall balance assessment: Needs assistance Sitting-balance support: Bilateral upper extremity supported;Feet supported Sitting balance-Leahy Scale: Poor     Standing balance support: Bilateral upper extremity supported;During functional activity Standing balance-Leahy Scale: Poor                      Cognition Arousal/Alertness: Awake/alert Behavior During Therapy: WFL for tasks assessed/performed Overall Cognitive Status: Within Functional Limits for tasks assessed                      Exercises      General Comments        Pertinent Vitals/Pain Pain Assessment: Faces Faces Pain Scale: Hurts even more Pain Location: Abdomen Pain Descriptors / Indicators: Grimacing;Guarding;Cramping Pain Intervention(s): Monitored during session;Premedicated before session;Repositioned    Home Living                      Prior Function            PT Goals (current goals can now be found in the care plan section) Acute Rehab PT Goals Patient Stated Goal: To return to independent PT Goal Formulation: With patient Time For Goal Achievement: 06/22/16 Potential to Achieve Goals: Good Progress towards PT goals: Progressing toward goals    Frequency    Min 3X/week  PT Plan Current plan remains appropriate    Co-evaluation             End of Session Equipment Utilized During Treatment: Oxygen Activity Tolerance: Patient limited by fatigue;Patient limited by pain Patient left: in chair;with call bell/phone within reach;with chair alarm set;with family/visitor present     Time: 2202-5427 PT Time Calculation (min) (ACUTE ONLY): 18 min  Charges:  $Gait Training: 8-22 mins                    G CodesCatarina Hartshorn, [place]  [phone] 06/19/2016, 11:48 AM

## 2016-06-19 NOTE — Progress Notes (Signed)
[facility] [place]  Subjective  - Doing a little better.  Multiple bouts of diarrhea last night, no frank blood.  Currently sitting up in bed side chair and has ambulated to the bathroom.     Objective (!) 143/72 (!) 107 98.3 F (36.8 C) (Oral) 19 96%  Intake/Output Summary (Last 24 hours) at 06/19/16 6394 Last data filed at 06/19/16 3200  Gross per 24 hour  Intake                0 ml  Output              400 ml  Net             -400 ml    Feet warm to touch , active range of motion and sensation intact Abdominal incision, abdomin soft Heart RRR Lungs SAT 89 on RA back to base line  Assessment/Planning: POD # 5 Admit & aortobifemoral bypass graft along with aorta SMA bypass graft re-implantation of superior mesenteric artery 11/30 extubated  Pending C diff Will advance diet to heart healthy as tolerates Transfer to 2W later today Mobility is encouraged  Laurence Slate [facility] 06/19/2016 8:08 AM --  Laboratory Lab Results:  Recent Labs  06/18/16 0221 06/19/16 0358  WBC 11.1* 11.2*  HGB 9.9* 10.4*  HCT 28.4* 30.4*  PLT 99* 148*   BMET  Recent Labs  06/18/16 0221 06/19/16 0358  NA 140 140  K 2.8* 3.3*  CL 107 107  CO2 24 24  GLUCOSE 87 77  BUN 18 16  CREATININE 0.87 0.76  CALCIUM 7.7* 7.7*    COAG Lab Results  Component Value Date   INR 1.63 06/14/2016   INR 1.37 06/14/2016   INR 1.03 06/06/2016   No results found for: PTT

## 2016-06-19 NOTE — Care Management Note (Addendum)
Case Management Note  Patient Details  Name: Kayla Sawyer MRN: [id number] Date of Birth: 09-Sep-1945  Subjective/Objective:    Pod 5 fem pop bypass, from home with spouse, she has a rolling walker , a rollator and a bsc.  Per pt /ot eval rec HHPT/HHOT. Patient and daughter Mackey [place]ch[place]  [phone]  chose [facility] , referral made to [place] with [facility]. Soc will begin 24-48 hrs post dc.  Patient states she may need a shower chair, NCM informed her that medicaid pays for a shower chair, so she could get one at [facility] for probably a lower price, she states she will look into getting it from [place]. NCM will cont to follow for dc needs.      12/4 [place], BSN - NCM received call from [place] with [facility] , she states the patient insurance is out of network for them.  NCM contacted patient an daughter n law in room , informed her that [facility] is out of network.  She then chose [place], referral given to [place] with [facility].  She states she will check the address and call me back.  Edwina called back and states yes they can take patient .                Action/Plan:   Expected Discharge Date:                  Expected Discharge Plan:  [place]  In-House Referral:     Discharge planning Services  CM Consult  Post Acute Care Choice:  Home Health Choice offered to:  Patient  DME Arranged:    DME Agency:     HH Arranged:  PT, OT HH Agency:  [place]  Status of Service:  Completed, signed off  If discussed at [place] of Stay Meetings, dates discussed:    Additional Comments:  Zenon Mayo, RN 06/19/2016, 11:17 AM

## 2016-06-19 NOTE — Care Management Important Message (Signed)
Important Message  Patient Details  Name: Kayla Sawyer MRN: [id number] Date of Birth: [birth year]   Medicare Important Message Given:  Yes    Nathen May 06/19/2016, 11:23 AM

## 2016-06-19 NOTE — Progress Notes (Signed)
Notified by CCMD of Pt with elevated heart rate.  Subsequently received call from NT Pt was using restroom when she developed a nose bleed.  This Pt went to Pt room, Pt sitting on toilet, nose was not bleeding upon assessment.  Pt states she feels weak and unable to stand from toilet.  Assessed Pt oxygen saturation.  Pt oxygen sat 76-80% on RA.  Pt had removed oxygen to ambulate to bathroom.  Placed oxygen on Pt and increased oxygen to 5L.  Pt continued to sat in the low 80's.  Increased O2 flow to 10%, Pt O2 came up to 91%.  Respiratory therapist requested to assist at this time.  Assisted Pt from bathroom x 2 assist back to bed.  After Pt resting in bed, oxygen saturations rose, oxygen turned down to 3L with Pt satting 95%.  RT returned, Pt no longer required venturi mask.  RT brought water bottle for humidification of oxygen.  Pt pulse decreasing at this time as Pt rested.  Ensured extension tubing for oxygen was available, and educated Pt on need for continued oxygen use at this time and specifically when ambulating.  Pt indicates understanding.  Will have Pt use BSC through evening and continue to assess oxygen levels.

## 2016-06-20 ENCOUNTER — Ambulatory Visit: Payer: Medicare Other | Admitting: Cardiovascular Disease

## 2016-06-20 LAB — GLUCOSE, CAPILLARY: Glucose-Capillary: 85 mg/dL (ref 65–99)

## 2016-06-20 MED ORDER — OXYCODONE-ACETAMINOPHEN 5-325 MG PO TABS: 1.0000 | ORAL_TABLET | ORAL | 0 refills | Status: DC | PRN

## 2016-06-20 NOTE — Progress Notes (Signed)
Pt/family given discharge instructions, medication lists, follow up appointments, and when to call the doctor.  Pt/family verbalizes understanding. Patient transported to main entrance with oxygen. Payton Emerald, RN

## 2016-06-20 NOTE — Progress Notes (Signed)
Physical Therapy Treatment Patient Details Name: Kayla Sawyer MRN: [id number] DOB: 04-03-1946 Today's Date: 06/20/2016    History of Present Illness 70 y.o. with known history of COPD and pulmonary hypertension. Patient with known history of peripheral vascular disease and bilateral lower extremity claudication. Does have a long-standing history of ongoing tobacco use. Patient underwent aortobifemoral bypass graft as well as aorto SMA bypass graft reimplantation of superior mesenteric artery on 11/29.    PT Comments    Pt with improved activity tolerance this date functioning at min guard level with rollator. Pt on 3LO2 via [place] and SpO2 at 86-87% during ambulation. Recovered to low 90s s/p 2 min rest. Pt with good home set up and support.  Follow Up Recommendations  Home health PT;Supervision/Assistance - 24 hour     Equipment Recommendations  None recommended by PT    Recommendations for Other Services       Precautions / Restrictions Precautions Precautions: Fall Precaution Comments: SPO2 in 80s during activity on 3LO2 via [place] Restrictions Weight Bearing Restrictions: No    Mobility  Bed Mobility Overal bed mobility: Needs Assistance Bed Mobility: Rolling;Sidelying to Sit Rolling: Min assist Sidelying to sit: Min assist       General bed mobility comments: v/c's for technique to roll and push up, labored effort, increased time  Transfers Overall transfer level: Needs assistance Equipment used: Rolling walker (2 wheeled) Transfers: Sit to/from Stand Sit to Stand: Min guard         General transfer comment: v/c's for hand placement, min guard to steady during transition of hands from bed to RW  Ambulation/Gait Ambulation/Gait assistance: Min guard Ambulation Distance (Feet): 75 Feet (x2) Assistive device: 4-wheeled walker Gait Pattern/deviations: Step-through pattern Gait velocity: slow Gait velocity interpretation: Below normal speed for age/gender General  Gait Details: SPO2 at 82% on 2LO2 via [place], 86-87% on 3LO2 via [place] with v/c's to take deep breaths   Stairs Stairs: Yes Stairs assistance: Min assist Stair Management: One rail Right;Step to pattern Number of Stairs: 2 General stair comments: pt steady, slow, guarded  Wheelchair Mobility    Modified Rankin (Stroke Patients Only)       Balance           Standing balance support: Bilateral upper extremity supported Standing balance-Leahy Scale: Poor Standing balance comment: needs UE support                    Cognition Arousal/Alertness: Awake/alert Behavior During Therapy: WFL for tasks assessed/performed Overall Cognitive Status: Within Functional Limits for tasks assessed                      Exercises      General Comments General comments (skin integrity, edema, etc.): HR in 130s during ambulation and SpO2 in 80s during activity but recovered to low 90s once at rest s/p 2 min      Pertinent Vitals/Pain Pain Assessment: No/denies pain    Home Living                      Prior Function            PT Goals (current goals can now be found in the care plan section) Acute Rehab PT Goals Patient Stated Goal: home  Progress towards PT goals: Progressing toward goals    Frequency    Min 3X/week      PT Plan Current plan remains appropriate    Co-evaluation  End of Session Equipment Utilized During Treatment: Gait belt;Oxygen (3LO2 via [place]) Activity Tolerance: Patient tolerated treatment well Patient left: in bed;with call bell/phone within reach;with family/visitor present (sitting EOB)     Time: 8343-7357 PT Time Calculation (min) (ACUTE ONLY): 19 min  Charges:  $Gait Training: 8-22 mins                    G Codes:      Rebakah Cokley M Devany Aja 2016-07-20, 11:19 AM  Kittie Plater, PT, DPT Pager #: [phone] Office #: [phone]

## 2016-06-20 NOTE — Care Management Note (Signed)
Case Management Note Previous CM note initiated by Zenon Mayo, RN-06/19/2016, 11:45 AM   Patient Details  Name: Kayla Sawyer MRN: [id number] Date of Birth: 1945/08/26  Subjective/Objective:     Pod 5 fem pop bypass, from home with spouse, she has a rolling walker , a rollator and a bsc.  Per pt /ot eval rec HHPT/HHOT. Patient and daughter Mackey Birchwood  [phone]  chose [facility] , referral made to [place] with [facility]. Soc will begin 24-48 hrs post dc.  Patient states she may need a shower chair, NCM informed her that medicaid pays for a shower chair, so she could get one at [facility] for probably a lower price, she states she will look into getting it from [place]. NCM will cont to follow for dc needs.      12/4 [place], BSN - NCM received call from [place] with [facility] , she states the patient insurance is out of network for them.  NCM contacted patient an daughter n law in room , informed her that [facility] is out of network.  She then chose [place], referral given to [place] with [facility].  She states she will check the address and call me back.  Edwina called back and states yes they can take patient .                 Action/Plan: Pt tx from 3S to 2W on 06/19/16  Expected Discharge Date:    06/20/16              Expected Discharge Plan:  [place]  In-House Referral:     Discharge planning Services  CM Consult  Post Acute Care Choice:  Home Health, Durable Medical Equipment Choice offered to:  Patient  DME Arranged:  Oxygen DME Agency:  [place] Arranged:  PT, OT [facility] Agency:  [place]  Status of Service:  Completed, signed off  If discussed at [place] of Stay Meetings, dates discussed:    Additional Comments:  06/20/16- 1100- Jeydi Klingel RN, CM- pt for d/c home today- orders placed for HHPT, referral has already been sent to [facility]- confirmed d/c with Wynetta Emery- pt now also need home  02- order has been placed and pt has qualified for home 02- spoke with pt and family at bedside- pt has used [place] in past but states she would like to use [facility] this time for DME needs- referral has been sent to [facility] with [facility] for home 02 needs- portable tank to be delivered to room prior to discharge.   Dahlia Client [place], RN 06/20/2016, 11:09 AM [phone]

## 2016-06-20 NOTE — Progress Notes (Addendum)
[facility] [place]  Subjective  - Feeling better wants to go home.   Objective 131/64 (!) 118 98.2 F (36.8 C) (Oral) 20 94% No intake or output data in the 24 hours ending 06/20/16 7001  Feet warm well perfused Abdomin soft incision healing well clean and dry Groins soft and healing well  Assessment/Planning: POD # 6 Admit & aortobifemoral bypass graft along with aorta SMA bypass graft re-implantation of superior mesenteric artery 11/30 extubated O 2 SAT drops to 70-80's on RA, now on 3 L at rest  Ambulating with rolling walker Tolerating full diet No diarrhea in past 32 hours, C diff negative Plan D/C home with PT  And home O2 F/U with Dr. Trula Slade in 2 weeks  Theda Sers [place] 06/20/2016 7:22 AM --  Laboratory Lab Results:  Recent Labs  06/18/16 0221 06/19/16 0358  WBC 11.1* 11.2*  HGB 9.9* 10.4*  HCT 28.4* 30.4*  PLT 99* 148*   BMET  Recent Labs  06/18/16 0221 06/19/16 0358  NA 140 140  K 2.8* 3.3*  CL 107 107  CO2 24 24  GLUCOSE 87 77  BUN 18 16  CREATININE 0.87 0.76  CALCIUM 7.7* 7.7*    COAG Lab Results  Component Value Date   INR 1.63 06/14/2016   INR 1.37 06/14/2016   INR 1.03 06/06/2016   No results found for: PTT   I have independently interviewed patient and agree with PA assessment and plan above. Patient desires to go home, will need O2 at home.  Jimena Wieczorek C. Donzetta Matters, MD [facility] [place] Office: [phone] Pager: [phone]

## 2016-06-22 DIAGNOSIS — J449 Chronic obstructive pulmonary disease, unspecified: Secondary | ICD-10-CM | POA: Diagnosis not present

## 2016-06-22 DIAGNOSIS — K219 Gastro-esophageal reflux disease without esophagitis: Secondary | ICD-10-CM | POA: Diagnosis not present

## 2016-06-22 DIAGNOSIS — R531 Weakness: Secondary | ICD-10-CM | POA: Diagnosis not present

## 2016-06-22 DIAGNOSIS — R262 Difficulty in walking, not elsewhere classified: Secondary | ICD-10-CM | POA: Diagnosis not present

## 2016-06-22 DIAGNOSIS — I739 Peripheral vascular disease, unspecified: Secondary | ICD-10-CM | POA: Diagnosis not present

## 2016-06-22 DIAGNOSIS — M7061 Trochanteric bursitis, right hip: Secondary | ICD-10-CM | POA: Diagnosis not present

## 2016-06-22 DIAGNOSIS — I10 Essential (primary) hypertension: Secondary | ICD-10-CM | POA: Diagnosis not present

## 2016-06-22 NOTE — Discharge Summary (Signed)
[facility] Discharge Summary   Patient ID:  Kayla Sawyer MRN: [id number] DOB/AGE: [birth year] 70 y.o.  Admit date: 06/14/2016 Discharge date: 06/22/2016 Date of Surgery: 06/14/2016 Surgeon: Surgeon(s): Serafina Mitchell, MD  Admission Diagnosis: Peripheral vascular disease with bilateral lower extremity claudication I70.213  Discharge Diagnoses:  Peripheral vascular disease with bilateral lower extremity claudication I70.213  Secondary Diagnoses: Past Medical History:  Diagnosis Date  . Arthritis   . COPD (chronic obstructive pulmonary disease) ([place])   . Depression   . E. coli pyelonephritis 6//2014  . GERD (gastroesophageal reflux disease)   . Pneumonia   . Pulmonary hypertension   . Sepsis ([place])     Procedure(s): AORTOBIFEMORAL BYPASS GRAFT AORTA SMA BYPASS GRAFT RE-IMPLANTATION  OF SUPERIOR MESENTERIC ARTERY  Discharged Condition: good  HPI:  Kayla Sawyer a 70 y.o.female, who is referred today for evaluation of bilateral claudication. She states that she has significant issues with ambulation. She can hardly get around her house. She denies having nonhealing wounds. She has recently undergone catheterization which shows left iliac occlusion and significant calcific stenosis within her infrarenal aorta. This is also confirmed on CT scan. She is referred for aortobifemoral bypass graft.  The patient has a 50+-pack-year history of smoking and has COPD. She is followed by pulmonary and is also suspected to have scleroderma. She recently had cardiac catheterization which showed mild obstructive disease and no pulmonary hypertension. She suffers from hypercholesterolemia which is managed with a statin. She is on a baby aspirin for antiplatelet therapy.  She is here today to discuss the pulmonary risk factors for surgery which is scheduled for this Wednesday  Hospital Course:  Kayla Sawyer is a 70 y.o. female is S/P   Procedure(s): AORTOBIFEMORAL BYPASS GRAFT AORTA SMA BYPASS GRAFT RE-IMPLANTATION  OF SUPERIOR MESENTERIC ARTERY  CCM consulted post op for respiratory failure and contiued intubation.  Management of airway intubation.  Dg Chest Port 1 View  Result Date: 06/14/2016 CLINICAL DATA:  Hypoxia EXAM: PORTABLE CHEST 1 VIEW COMPARISON:  Chest radiograph December 30, 2012; chest CT December 30, 2015 FINDINGS: Endotracheal tube tip is 5.0 cm above carina. Nasogastric tube tip and side port are below the diaphragm. Central catheter tip is in the superior vena cava. No pneumothorax. There is atelectatic change in the left base with small left pleural effusion.   POD#2 extubated  Assessment/Planning: 70 y.o. female is s/p: aortobifemoral bypass, aorta to SMA bypass graft, re-implantation of IMA 2 Days Post-Op   Pulm: extubated yesterday, breathing comfortably on 2L, CXR with atelectasis.  Continue incentive spirometry, flutter valve, prn nebs CV: tachycardic 110s-120s likely secondary to pain, off neo this am. SBP stable in 120s GI: no NG output last shift, will d/c NG tube. Had BM this am, but minimal bowel sounds present with some nausea still. Will keep NPO except for ice chips. PPI for stress ulcer prophylaxis.  Heme: Hgb stable, transfuse <7.  Renal: UOP ok, ARF improving creatinine 1.18. Continue IVF. Endocrine: continue SSI ID: leukocytosis still likely reactive from surgery. No abx indicated.   Mobilize today. D/c a-line. D/c foley. Can transfer out of ICU. Dr. Oneida Alar to see today.  Appreciate PCCM assistance with this patient.   POD#3 P:   Pulmonary hygiene - IS, flutter, mobilize Monitor LLL, fever curve, WBC  Intermittent CXR  Will resume home spiriva & D/C scheduled BD's as may be contributing to tachycardia Xopenex prn wheezing  Tachycardia Lungs 2 L [place] Left groin ecchymosis Palpable PT  bilaterally Abdomin soft Heart Tachy   Assessment/Planning: POD # 3 Admit &  aortobifemoral bypass graft along with aorta SMA bypass graft re-implantation of superior mesenteric artery 11/30 extubated LLL Atelectasis WBC improving Cr improved 0.98, UO >1800 daily HGB 7.2 will transfuse 2 units PRBC Start clear liquids slowly  Assessment/Planning: POD # 4 Admit & aortobifemoral bypass graft along with aorta SMA bypass graft re-implantation of superior mesenteric artery 11/30 extubated  HGB improved after 2 units PRBC 9.9 Cr 0.87 Advance diet to full liquids, tolerated clears without N/V Restarted home PO medications Transfer to 3S today  Assessment/Planning: POD # 5 Admit & aortobifemoral bypass graft along with aorta SMA bypass graft re-implantation of superior mesenteric artery 11/30 extubated  Pending C diff Will advance diet to heart healthy as tolerates Transfer to 2W later today Mobility is encouraged  Assessment/Planning: POD # 6 Admit & aortobifemoral bypass graft along with aorta SMA bypass graft re-implantation of superior mesenteric artery 11/30 extubated O 2 SAT drops to 70-80's on RA, now on 3 L at rest  Ambulating with rolling walker Tolerating full diet No diarrhea in past 32 hours, C diff negative Plan D/C home with PT  And home O2 F/U with Dr. Trula Slade in 2 weeks  Consults:  Treatment Team:  Waynetta Sandy, MD  Significant Diagnostic Studies: CBC Lab Results  Component Value Date   WBC 11.2 (H) 06/19/2016   HGB 10.4 (L) 06/19/2016   HCT 30.4 (L) 06/19/2016   MCV 88.9 06/19/2016   PLT 148 (L) 06/19/2016    BMET    Component Value Date/Time   NA 140 06/19/2016 0358   K 3.3 (L) 06/19/2016 0358   CL 107 06/19/2016 0358   CO2 24 06/19/2016 0358   GLUCOSE 77 06/19/2016 0358   BUN 16 06/19/2016 0358   CREATININE 0.76 06/19/2016 0358   CREATININE 0.90 02/08/2016 0949   CALCIUM 7.7 (L) 06/19/2016 0358   GFRNONAA >60 06/19/2016 0358   GFRAA >60 06/19/2016 0358   COAG Lab Results  Component Value Date    INR 1.63 06/14/2016   INR 1.37 06/14/2016   INR 1.03 06/06/2016     Disposition:  Discharge to :Home Discharge Instructions    Call MD for:  redness, tenderness, or signs of infection (pain, swelling, bleeding, redness, odor or green/yellow discharge around incision site)    Complete by:  As directed    Call MD for:  severe or increased pain, loss or decreased feeling  in affected limb(s)    Complete by:  As directed    Call MD for:  temperature >100.5    Complete by:  As directed    Discharge instructions    Complete by:  As directed    You may shower daily.   Driving Restrictions    Complete by:  As directed    No driving for 2 weeks   Lifting restrictions    Complete by:  As directed    No heavy  lifting for 4 weeks   Resume previous diet    Complete by:  As directed        Medication List    TAKE these medications   acetaminophen 500 MG tablet Commonly known as:  TYLENOL Take 500 mg by mouth every 6 (six) hours as needed for mild pain.   aspirin EC 81 MG tablet Take 1 tablet (81 mg total) by mouth daily.   atorvastatin 20 MG tablet Commonly known as:  LIPITOR Take 1 tablet (20  mg total) by mouth daily.   CHANTIX 1 MG tablet Generic drug:  varenicline Take 1 mg by mouth 2 (two) times daily.   cholecalciferol 1000 units tablet Commonly known as:  VITAMIN D Take 1,000 Units by mouth daily.   multivitamin with minerals Tabs tablet Take 1 tablet by mouth daily.   omeprazole 20 MG capsule Commonly known as:  PRILOSEC Take 20 mg by mouth daily.   oxyCODONE-acetaminophen 5-325 MG tablet Commonly known as:  PERCOCET/ROXICET Take 1 tablet by mouth every 4 (four) hours as needed for moderate pain.   sertraline 50 MG tablet Commonly known as:  ZOLOFT Take 50 mg by mouth daily.   Tiotropium Bromide Monohydrate 2.5 MCG/ACT Aers Commonly known as:  SPIRIVA RESPIMAT Inhale 2 puffs into the lungs daily.      Verbal and written Discharge instructions given to  the patient. Wound care per Discharge AVS Follow-up Information    [facility] Follow up.   Specialty:  [place] Why:  HHPT arranged- they will call you to set up home visits Contact information: [address] [place] [address] [phone]        [facility] Follow up.   Why:  Home 02- arranged- portable tank to be delivered to room prior to discharge.  Contact information: [place] [address] [phone]           Signed: Laurence Slate [facility] 06/22/2016, 9:37 AM - For VQI Registry use --- Instructions: Press F2 to tab through selections.  Delete question if not applicable.   Post-op:  Time to Extubation: '[ ]'$  In OR, '[ ]'$  < 12 hrs, '[ ]'$  12-24 hrs, '[ ]'$  >=24 hrs Vasopressors Req. Post-op: Yes ICU Stay: 3 days Transfusion: Yes  If yes, 4 units given MI: [x ] No, '[ ]'$  Troponin only, '[ ]'$  EKG or Clinical New Arrhythmia: No  Complications: CHF: No Resp failure: '[ ]'$  none, '[ ]'$  Pneumonia, [x ] Ventilator Chg in renal function: [x ] none, '[ ]'$  Inc. Cr > 0.5, '[ ]'$  Temp. Dialysis, '[ ]'$  Permanent dialysis Leg ischemia: [x ] No, '[ ]'$  Yes, no Surgery needed, '[ ]'$  Yes, Surgery needed, '[ ]'$  Amputation Bowel ischemia: [ x] No, '[ ]'$  Medical Rx, '[ ]'$  Surgical Rx Wound complication: [x ] No, '[ ]'$  Superficial separation/infection, '[ ]'$  Return to OR Return to OR: No  Return to OR for bleeding: No Stroke: [x ] None, '[ ]'$  Minor, '[ ]'$  Major  Discharge medications: Statin use:  Yes ASA use:  Yes Plavix use:  No  for medical reason   Beta blocker use: No  for medical reason

## 2016-06-26 DIAGNOSIS — R531 Weakness: Secondary | ICD-10-CM | POA: Diagnosis not present

## 2016-06-26 DIAGNOSIS — R262 Difficulty in walking, not elsewhere classified: Secondary | ICD-10-CM | POA: Diagnosis not present

## 2016-06-26 DIAGNOSIS — J449 Chronic obstructive pulmonary disease, unspecified: Secondary | ICD-10-CM | POA: Diagnosis not present

## 2016-06-26 DIAGNOSIS — I739 Peripheral vascular disease, unspecified: Secondary | ICD-10-CM | POA: Diagnosis not present

## 2016-06-26 DIAGNOSIS — I10 Essential (primary) hypertension: Secondary | ICD-10-CM | POA: Diagnosis not present

## 2016-06-27 DIAGNOSIS — R531 Weakness: Secondary | ICD-10-CM | POA: Diagnosis not present

## 2016-06-27 DIAGNOSIS — R262 Difficulty in walking, not elsewhere classified: Secondary | ICD-10-CM | POA: Diagnosis not present

## 2016-06-27 DIAGNOSIS — I10 Essential (primary) hypertension: Secondary | ICD-10-CM | POA: Diagnosis not present

## 2016-06-27 DIAGNOSIS — J449 Chronic obstructive pulmonary disease, unspecified: Secondary | ICD-10-CM | POA: Diagnosis not present

## 2016-06-27 DIAGNOSIS — I739 Peripheral vascular disease, unspecified: Secondary | ICD-10-CM | POA: Diagnosis not present

## 2016-06-29 DIAGNOSIS — I10 Essential (primary) hypertension: Secondary | ICD-10-CM | POA: Diagnosis not present

## 2016-06-29 DIAGNOSIS — J449 Chronic obstructive pulmonary disease, unspecified: Secondary | ICD-10-CM | POA: Diagnosis not present

## 2016-06-29 DIAGNOSIS — R262 Difficulty in walking, not elsewhere classified: Secondary | ICD-10-CM | POA: Diagnosis not present

## 2016-06-29 DIAGNOSIS — I739 Peripheral vascular disease, unspecified: Secondary | ICD-10-CM | POA: Diagnosis not present

## 2016-06-29 DIAGNOSIS — R531 Weakness: Secondary | ICD-10-CM | POA: Diagnosis not present

## 2016-07-03 DIAGNOSIS — R262 Difficulty in walking, not elsewhere classified: Secondary | ICD-10-CM | POA: Diagnosis not present

## 2016-07-03 DIAGNOSIS — J449 Chronic obstructive pulmonary disease, unspecified: Secondary | ICD-10-CM | POA: Diagnosis not present

## 2016-07-03 DIAGNOSIS — I739 Peripheral vascular disease, unspecified: Secondary | ICD-10-CM | POA: Diagnosis not present

## 2016-07-03 DIAGNOSIS — R531 Weakness: Secondary | ICD-10-CM | POA: Diagnosis not present

## 2016-07-03 DIAGNOSIS — I10 Essential (primary) hypertension: Secondary | ICD-10-CM | POA: Diagnosis not present

## 2016-07-04 DIAGNOSIS — J449 Chronic obstructive pulmonary disease, unspecified: Secondary | ICD-10-CM | POA: Diagnosis not present

## 2016-07-04 DIAGNOSIS — R262 Difficulty in walking, not elsewhere classified: Secondary | ICD-10-CM | POA: Diagnosis not present

## 2016-07-04 DIAGNOSIS — I739 Peripheral vascular disease, unspecified: Secondary | ICD-10-CM | POA: Diagnosis not present

## 2016-07-04 DIAGNOSIS — R531 Weakness: Secondary | ICD-10-CM | POA: Diagnosis not present

## 2016-07-04 DIAGNOSIS — I10 Essential (primary) hypertension: Secondary | ICD-10-CM | POA: Diagnosis not present

## 2016-07-06 DIAGNOSIS — R531 Weakness: Secondary | ICD-10-CM | POA: Diagnosis not present

## 2016-07-06 DIAGNOSIS — I10 Essential (primary) hypertension: Secondary | ICD-10-CM | POA: Diagnosis not present

## 2016-07-06 DIAGNOSIS — R262 Difficulty in walking, not elsewhere classified: Secondary | ICD-10-CM | POA: Diagnosis not present

## 2016-07-06 DIAGNOSIS — J449 Chronic obstructive pulmonary disease, unspecified: Secondary | ICD-10-CM | POA: Diagnosis not present

## 2016-07-06 DIAGNOSIS — I739 Peripheral vascular disease, unspecified: Secondary | ICD-10-CM | POA: Diagnosis not present

## 2016-07-11 DIAGNOSIS — I10 Essential (primary) hypertension: Secondary | ICD-10-CM | POA: Diagnosis not present

## 2016-07-11 DIAGNOSIS — R262 Difficulty in walking, not elsewhere classified: Secondary | ICD-10-CM | POA: Diagnosis not present

## 2016-07-11 DIAGNOSIS — I739 Peripheral vascular disease, unspecified: Secondary | ICD-10-CM | POA: Diagnosis not present

## 2016-07-11 DIAGNOSIS — R531 Weakness: Secondary | ICD-10-CM | POA: Diagnosis not present

## 2016-07-11 DIAGNOSIS — J449 Chronic obstructive pulmonary disease, unspecified: Secondary | ICD-10-CM | POA: Diagnosis not present

## 2016-07-12 DIAGNOSIS — J449 Chronic obstructive pulmonary disease, unspecified: Secondary | ICD-10-CM | POA: Diagnosis not present

## 2016-07-12 DIAGNOSIS — I739 Peripheral vascular disease, unspecified: Secondary | ICD-10-CM | POA: Diagnosis not present

## 2016-07-12 DIAGNOSIS — I10 Essential (primary) hypertension: Secondary | ICD-10-CM | POA: Diagnosis not present

## 2016-07-12 DIAGNOSIS — R262 Difficulty in walking, not elsewhere classified: Secondary | ICD-10-CM | POA: Diagnosis not present

## 2016-07-12 DIAGNOSIS — R531 Weakness: Secondary | ICD-10-CM | POA: Diagnosis not present

## 2016-07-13 DIAGNOSIS — J449 Chronic obstructive pulmonary disease, unspecified: Secondary | ICD-10-CM | POA: Diagnosis not present

## 2016-07-13 DIAGNOSIS — I739 Peripheral vascular disease, unspecified: Secondary | ICD-10-CM | POA: Diagnosis not present

## 2016-07-13 DIAGNOSIS — R531 Weakness: Secondary | ICD-10-CM | POA: Diagnosis not present

## 2016-07-13 DIAGNOSIS — I10 Essential (primary) hypertension: Secondary | ICD-10-CM | POA: Diagnosis not present

## 2016-07-13 DIAGNOSIS — R262 Difficulty in walking, not elsewhere classified: Secondary | ICD-10-CM | POA: Diagnosis not present

## 2016-07-20 DIAGNOSIS — R531 Weakness: Secondary | ICD-10-CM | POA: Diagnosis not present

## 2016-07-20 DIAGNOSIS — R262 Difficulty in walking, not elsewhere classified: Secondary | ICD-10-CM | POA: Diagnosis not present

## 2016-07-20 DIAGNOSIS — I739 Peripheral vascular disease, unspecified: Secondary | ICD-10-CM | POA: Diagnosis not present

## 2016-07-20 DIAGNOSIS — J449 Chronic obstructive pulmonary disease, unspecified: Secondary | ICD-10-CM | POA: Diagnosis not present

## 2016-07-20 DIAGNOSIS — I10 Essential (primary) hypertension: Secondary | ICD-10-CM | POA: Diagnosis not present

## 2016-07-21 DIAGNOSIS — R269 Unspecified abnormalities of gait and mobility: Secondary | ICD-10-CM | POA: Diagnosis not present

## 2016-07-31 ENCOUNTER — Ambulatory Visit (HOSPITAL_COMMUNITY)
Admission: RE | Admit: 2016-07-31 | Discharge: 2016-07-31 | Disposition: A | Discharge status: Home / Self Care | Payer: Medicare Other | Source: Ambulatory Visit | Attending: Internal Medicine | Admitting: Internal Medicine

## 2016-07-31 DIAGNOSIS — I700 Atherosclerosis of aorta: Secondary | ICD-10-CM | POA: Diagnosis not present

## 2016-07-31 DIAGNOSIS — J849 Interstitial pulmonary disease, unspecified: Secondary | ICD-10-CM | POA: Insufficient documentation

## 2016-07-31 DIAGNOSIS — R911 Solitary pulmonary nodule: Secondary | ICD-10-CM | POA: Insufficient documentation

## 2016-07-31 DIAGNOSIS — J439 Emphysema, unspecified: Secondary | ICD-10-CM | POA: Diagnosis not present

## 2016-07-31 DIAGNOSIS — F17210 Nicotine dependence, cigarettes, uncomplicated: Secondary | ICD-10-CM | POA: Insufficient documentation

## 2016-07-31 DIAGNOSIS — I2510 Atherosclerotic heart disease of native coronary artery without angina pectoris: Secondary | ICD-10-CM | POA: Insufficient documentation

## 2016-08-01 ENCOUNTER — Encounter: Payer: Self-pay | Admitting: Surgery

## 2016-08-04 DIAGNOSIS — R531 Weakness: Secondary | ICD-10-CM | POA: Diagnosis not present

## 2016-08-04 DIAGNOSIS — I739 Peripheral vascular disease, unspecified: Secondary | ICD-10-CM | POA: Diagnosis not present

## 2016-08-04 DIAGNOSIS — I10 Essential (primary) hypertension: Secondary | ICD-10-CM | POA: Diagnosis not present

## 2016-08-04 DIAGNOSIS — J449 Chronic obstructive pulmonary disease, unspecified: Secondary | ICD-10-CM | POA: Diagnosis not present

## 2016-08-04 DIAGNOSIS — R262 Difficulty in walking, not elsewhere classified: Secondary | ICD-10-CM | POA: Diagnosis not present

## 2016-08-07 ENCOUNTER — Encounter: Payer: Self-pay | Admitting: Surgery

## 2016-08-07 ENCOUNTER — Ambulatory Visit (INDEPENDENT_AMBULATORY_CARE_PROVIDER_SITE_OTHER): Payer: Self-pay | Admitting: Surgery

## 2016-08-07 VITALS — BP 122/77 | HR 102 | Temp 97.1°F | Resp 18 | Ht 68.0 in | Wt 130.5 lb

## 2016-08-07 DIAGNOSIS — I70213 Atherosclerosis of native arteries of extremities with intermittent claudication, bilateral legs: Secondary | ICD-10-CM

## 2016-08-07 DIAGNOSIS — I7409 Other arterial embolism and thrombosis of abdominal aorta: Secondary | ICD-10-CM

## 2016-08-07 DIAGNOSIS — I739 Peripheral vascular disease, unspecified: Secondary | ICD-10-CM

## 2016-08-07 NOTE — Progress Notes (Signed)
Patient name: Kayla Sawyer MRN: [id number] DOB: September 13, 1945 Sex: female  REASON FOR VISIT:    Post op  HISTORY OF PRESENT ILLNESS:   Kayla Sawyer is a 71 y.o. female who returns today for her first postoperative follow-up.  On 06/14/2016 she underwent an aortobifemoral bypass graft using a 14 x 7 bifurcated graft.  I also reimplanted the inferior mesenteric artery and did an aorta to superior mesenteric artery bypass graft using 7 mm dacryon.  Her operation was done for bilateral claudication.  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  The first few weeks at home or a bit of a struggle secondary to fatigue, however she is doing very well currently.  Her appetite is back to normal.  She no longer has claudication symptoms.  She was discharged home on home oxygen.  She has weaned herself off of this.   The patient has a 50+-pack-year history of smoking and has COPD. She is followed by pulmonary and is also suspected to have scleroderma. She recently had cardiac catheterization which showed mild obstructive disease and no pulmonary hypertension. She suffers from hypercholesterolemia which is managed with a statin. She is on a baby aspirin for antiplatelet therapy.  CURRENT MEDICATIONS:    Current Outpatient Prescriptions  Medication Sig Dispense Refill  . acetaminophen (TYLENOL) 500 MG tablet Take 500 mg by mouth every 6 (six) hours as needed for mild pain.    Marland Kitchen aspirin EC 81 MG tablet Take 1 tablet (81 mg total) by mouth daily.    Marland Kitchen atorvastatin (LIPITOR) 20 MG tablet Take 1 tablet (20 mg total) by mouth daily. 90 tablet 3  . cholecalciferol (VITAMIN D) 1000 units tablet Take 1,000 Units by mouth daily.    . Multiple Vitamin (MULTIVITAMIN WITH MINERALS) TABS tablet Take 1 tablet by mouth daily.    Marland Kitchen omeprazole (PRILOSEC) 20 MG capsule Take 20 mg by mouth daily.     . sertraline (ZOLOFT) 50 MG tablet Take 50 mg by mouth daily.    .  Tiotropium Bromide Monohydrate (SPIRIVA RESPIMAT) 2.5 MCG/ACT AERS Inhale 2 puffs into the lungs daily. 3 Inhaler 4  . CHANTIX 1 MG tablet Take 1 mg by mouth 2 (two) times daily.    Marland Kitchen oxyCODONE-acetaminophen (PERCOCET/ROXICET) 5-325 MG tablet Take 1 tablet by mouth every 4 (four) hours as needed for moderate pain. (Patient not taking: Reported on 08/07/2016) 20 tablet 0   No current facility-administered medications for this visit.     REVIEW OF SYSTEMS:   '[X]'$  denotes positive finding, '[ ]'$  denotes negative finding Cardiac  Comments:  Chest pain or chest pressure:    Shortness of breath upon exertion:    Short of breath when lying flat:    Irregular heart rhythm:    Constitutional    Fever or chills:      PHYSICAL EXAM:   Vitals:   08/07/16 0837  BP: 122/77  Pulse: (!) 102  Resp: 18  Temp: 97.1 F (36.2 C)  TempSrc: Oral  SpO2: 95%  Weight: 130 lb 8 oz (59.2 kg)  Height: '5\' 8"'$  (1.727 m)    GENERAL: The patient is a well-nourished female, in no acute distress. The vital signs are documented above. CARDIOVASCULAR: There is a regular rate and rhythm. PULMONARY: Non-labored respirations Palpable femoral pulses Midline incision is well-healed without hernia Small left groin seroma  STUDIES:   None   MEDICAL ISSUES:   The patient has done exceptionally well following  her operation.  She is now back to almost normal activity.  Her only postoperative issue is a small seroma in the left groin which I will continue to evaluate.  I do not think that she will need any kind of intervention as she states that this has gotten smaller since surgery.  I have her scheduled for follow-up in 3 months with duplex.  Annamarie Major, MD [facility] [phone] Pager [phone]

## 2016-08-11 NOTE — Addendum Note (Signed)
Addended by: Lianne Cure A on: 08/11/2016 12:17 PM   Modules accepted: Orders

## 2016-08-14 ENCOUNTER — Ambulatory Visit (INDEPENDENT_AMBULATORY_CARE_PROVIDER_SITE_OTHER): Payer: Medicare Other | Admitting: Internal Medicine

## 2016-08-14 ENCOUNTER — Encounter: Payer: Self-pay | Admitting: Internal Medicine

## 2016-08-14 VITALS — BP 116/64 | HR 111 | Ht 68.0 in | Wt 130.0 lb

## 2016-08-14 DIAGNOSIS — F17210 Nicotine dependence, cigarettes, uncomplicated: Secondary | ICD-10-CM | POA: Diagnosis not present

## 2016-08-14 DIAGNOSIS — J432 Centrilobular emphysema: Secondary | ICD-10-CM

## 2016-08-14 DIAGNOSIS — R911 Solitary pulmonary nodule: Secondary | ICD-10-CM

## 2016-08-14 NOTE — Patient Instructions (Addendum)
Centrilobular emphysema ([place]) - glad spiriva helping you; continue this  - use albuterol as needed -consider pulmnary rehab if needed  ILD (interstitial lung disease) ([place]) - reseolved on HRCT Jan 2018 - you do not have this problem  Nodule of right lung 48m RUL with history of smoking - June 2017  - resolved on CT chest Jan 2018   Smoking  - try to quit smoking asap   Followup - oct 2018 or sooner if needed

## 2016-08-14 NOTE — Progress Notes (Signed)
Subjective:     Patient ID: Kayla Sawyer, female   DOB: 1946-02-08, 71 y.o.   MRN: [id number]  HPI    PCP Wende Neighbors, MD   HPI   IOV 01/07/2016  Chief Complaint  Patient presents with  . Advice Only    Referred by Dr. Haroldine Laws for nsip.      71 year old female presents with her daughter Kayla Sawyer was discharged as of the emergency department at [facility]. At baseline she uses a walker for doing groceries or walking in the mall. She has chronic stable dyspnea for many years. She has a previous history of emphysema for which she took Advair but does not take it for many years. The dyspnea is only moderate.. But she also has chronic cough for the last several months it is associated. Cough is mild. More recently she has had fatigue and progression of dyspnea associated with skin lesions. She apparently saw Dr. Gavin Pound in rheumatology. Scleroderma was entertained but autoimmune panel has been negative. She subsequently saw Dr. Haroldine Laws. Echo suggested possible elevation in pulmonary artery pressure and therefore she underwent a right heart cath which is documented below. Mean pulmonary artery pressure is only 26 mmHg just at the cusp of normal/abnormal. She had CT scan of the chest 12/30/2015 that shows a 6 mm spiculated right upper lobe nodule and a patulous esophagus suggesting scleroderma. Therefore the diagnosis of scleroderma is being entertained again. In addition she has possible UIP pattern and emphysema on CT chest. She admits to significant physical deconditioning.      Pulmonary function test 12/09/2015 FVC 2.4 L/77%, ratio of 68 suggesting obstruction FEV1 1.06 L/44%. Total lung capacity 5.17/99%, DLCO 7.6 L/29%  High-resolution CT chest 12/30/2015 shows diffuse emphysema bilaterally associated with non-UIP pattern but possible UIP pattern ILD the bases. Looking back this might of started in 2012 CT chest   Cardiac catheterization results 01/03/2016 reviewed mean  pulmonary artery pressure 26 mmHg and she has nonobstructive coronary artery disease  Walking desaturation test on 01/07/2016 185 feet x 3 laps:  did NOT desaturate. Rest pulse ox was 94, final pulse ox was 92%. HR response 81/min at rest to 116 /min at peak exertion. = - Walk only one lap and stopped due to significant fatigue and back significantly tachycardic.    has a past medical history of Sepsis ([place]); Pneumonia; COPD (chronic obstructive pulmonary disease) ([place]); and E. coli pyelonephritis (6//2014).   reports that she has been smoking Cigarettes.  She has a 50 pack-year smoking history.    OV 03/30/2016  Chief Complaint  Patient presents with  . Follow-up    Pt states the Spiriva has slightly help her breathing and mucus production. Pt denies CP/tightness, wheezing, and f/c/s. Pt states she is unable to do rehab because of vessel occlutions in her leg, pt is seeing a vascular surgeon on 10/9   Follow-up mixed emphysema with ILD no UIP pattern: She is on Spiriva. This helps with her cough. This not much improvement in her dyspnea but this is because the primary problem is claudication. She has claudication that is long-standing and chronic when she walks from one room to the other in her house. She is due to see the vascular surgeon. Because of the claudication she is unable to pulmonary rehabilitation. She still is significant physical deconditioning. At this point she opts to continue Spiriva. We agreed there is no indication for add-on therapy because dyspnea is not the main issue. In terms of  possible autoimmune disease she is due to see Dr. Trudie Reed in November 2017. We will get the notes at that time. She will have high dose flu shot today  smoking: She continues to smoke struggles to quit  6 mm right upper lobe lung nodule: This was in June 2017. 6 month CT is required.    OV 08/14/2016  Chief Complaint  Patient presents with  . Follow-up    Pt here after HRCT. Pt states her  SOB has improved since recent cardiac surgery. Pt c/o mild prod cough - pt unsure of color. Pt denies CP/tightness and f/c/s.     Follow-up shortness of breath due to emphysema on CT chest and previous CT chest also suggesting ILD\ Follow-up lung nodule  Last visit September 2017 she showed improvement and shortness of breath with Spiriva but she was having significant claudication so she was referred to Dr. Trula Slade lessons performed prior to femoral bypass and after this a claudication is resolved. The surgery was in November 2017 and this data was gathered from her, her daughter and summarization of the old chart. She has more effort tolerance. She has hardly any dyspnea. She is able to do shopping. She feels her life quality is significantly improved. She did undergo repeat high-resolution CT chest 07/31/2016 that I personally visualized. Shows interstitial lung disease is resolved. The pulmonary nodule is resolved. She has residual emphysema and coronary artery calcification. Off note for the coronary artery calcification she did undergo right and left heart catheterization in June 2017 and she had normal LV function with nonobstructive coronary artery disease and only minimally elevated right-sided pressures  She is still continuing to smoke however. She is also not interested in pulmonary rehabilitation at this point  IMPRESSION: 1. Previously noted right upper lobe pulmonary nodule has nearly completely regressed, compatible with a benign infectious or inflammatory nodule. 2. No findings to suggest interstitial lung disease. 3. Diffuse bronchial wall thickening with moderate centrilobular and paraseptal emphysema; imaging findings suggestive of underlying COPD. 4. Aortic atherosclerosis, in addition to left main and 3 vessel coronary artery disease. Please note that although the presence of coronary artery calcium documents the presence of coronary artery disease, the severity of this  disease and any potential stenosis cannot be assessed on this non-gated CT examination. Assessment for potential risk factor modification, dietary therapy or pharmacologic therapy may be warranted, if clinically indicated. 5. Small amount of pericardial fluid and/or thickening, increased compared to the prior examination. No pericardial calcification.   Electronically Signed   By: Vinnie Langton M.D.   On: 07/31/2016 12:50    has a past medical history of Arthritis; COPD (chronic obstructive pulmonary disease) ([place]); Depression; E. coli pyelonephritis (6//2014); GERD (gastroesophageal reflux disease); Pneumonia; Pulmonary hypertension; and Sepsis ([place]).   reports that she has been smoking Cigarettes.  She has a 50.00 pack-year smoking history. She has never used smokeless tobacco.  Past Surgical History:  Procedure Laterality Date  . ABDOMINAL HYSTERECTOMY     partial  . AORTA - BILATERAL FEMORAL ARTERY BYPASS GRAFT Bilateral 06/14/2016   Procedure: AORTOBIFEMORAL BYPASS GRAFT AORTA SMA BYPASS GRAFT RE-IMPLANTATION  OF SUPERIOR MESENTERIC ARTERY;  Surgeon: Serafina Mitchell, MD;  Location: [facility];  Service: Vascular;  Laterality: Bilateral;  . BACK SURGERY     L4/5  . CARDIAC CATHETERIZATION N/A 01/03/2016   Procedure: Right/Left Heart Cath and Coronary Angiography;  Surgeon: Jolaine Artist, MD;  Location: [place] [facility];  Service: Cardiovascular;  Laterality: N/A;  .  COLONOSCOPY N/A 08/05/2015   Procedure: COLONOSCOPY;  Surgeon: Rogene Houston, MD;  [facility];  Service: Endoscopy;  Laterality: N/A;  730  . ELBOW SURGERY    . PERIPHERAL VASCULAR CATHETERIZATION N/A 02/16/2016   Procedure: Abdominal Aortogram w/Lower Extremity;  Surgeon: Wellington Hampshire, MD;  Location: [place] [facility];  Service: Cardiovascular;  Laterality: N/A;    Allergies  Allergen Reactions  . Sulfur Nausea Only    Immunization History  Administered Date(s) Administered  . Influenza  Split 07/08/2011, 07/09/2015  . Influenza, High Dose Seasonal PF 03/30/2016  . Pneumococcal Conjugate-13 07/09/2015  . Pneumococcal Polysaccharide-23 07/08/2011    Family History  Problem Relation Age of Onset  . Diabetes Mother   . Emphysema Father      Current Outpatient Prescriptions:  .  acetaminophen (TYLENOL) 500 MG tablet, Take 500 mg by mouth every 6 (six) hours as needed for mild pain., Disp: , Rfl:  .  aspirin EC 81 MG tablet, Take 1 tablet (81 mg total) by mouth daily., Disp: , Rfl:  .  atorvastatin (LIPITOR) 20 MG tablet, Take 1 tablet (20 mg total) by mouth daily., Disp: 90 tablet, Rfl: 3 .  cholecalciferol (VITAMIN D) 1000 units tablet, Take 1,000 Units by mouth daily., Disp: , Rfl:  .  Multiple Vitamin (MULTIVITAMIN WITH MINERALS) TABS tablet, Take 1 tablet by mouth daily., Disp: , Rfl:  .  omeprazole (PRILOSEC) 20 MG capsule, Take 20 mg by mouth daily. , Disp: , Rfl:  .  sertraline (ZOLOFT) 50 MG tablet, Take 50 mg by mouth daily., Disp: , Rfl:  .  Tiotropium Bromide Monohydrate (SPIRIVA RESPIMAT) 2.5 MCG/ACT AERS, Inhale 2 puffs into the lungs daily., Disp: 3 Inhaler, Rfl: 4    Review of Systems     Objective:   Physical Exam  Constitutional: She is oriented to person, place, and time. She appears well-nourished. No distress.  Thin lady  HENT:  Head: Normocephalic and atraumatic.  Right Ear: External ear normal.  Left Ear: External ear normal.  Mouth/Throat: Oropharynx is clear and moist. No oropharyngeal exudate.  Eyes: Conjunctivae and EOM are normal. Pupils are equal, round, and reactive to light. Right eye exhibits no discharge. Left eye exhibits no discharge. No scleral icterus.  Neck: Normal range of motion. Neck supple. No JVD present. No tracheal deviation present. No thyromegaly present.  Cardiovascular: Normal rate, regular rhythm, normal heart sounds and intact distal pulses.  Exam reveals no gallop and no friction rub.   No murmur  heard. Pulmonary/Chest: Effort normal and breath sounds normal. No respiratory distress. She has no wheezes. She has no rales. She exhibits no tenderness.  Abdominal: Soft. Bowel sounds are normal. She exhibits no distension and no mass. There is no tenderness. There is no rebound and no guarding.  Musculoskeletal: Normal range of motion. She exhibits no edema or tenderness.  Lymphadenopathy:    She has no cervical adenopathy.  Neurological: She is alert and oriented to person, place, and time. She has normal reflexes. No cranial nerve deficit. She exhibits normal muscle tone. Coordination normal.  Skin: Skin is warm and dry. No rash noted. She is not diaphoretic. No erythema. No pallor.  Psychiatric: She has a normal mood and affect. Her behavior is normal. Judgment and thought content normal.  Vitals reviewed.   Vitals:   08/14/16 1220  BP: 116/64  Pulse: (!) 111  SpO2: 95%  Weight: 130 lb (59 kg)  Height: '5\' 8"'$  (1.727 m)  Estimated body mass index is 19.77 kg/m as calculated from the following:   Height as of this encounter: '5\' 8"'$  (1.727 m).   Weight as of this encounter: 130 lb (59 kg).      Assessment:       ICD-9-CM ICD-10-CM   1. Centrilobular emphysema (HCC) 492.8 J43.2   2. Nodule of right lung 793.11 R91.1   3. Smoking greater than 40 pack years 305.1 F17.210        Plan:     Centrilobular emphysema ([place]) - glad spiriva helping you; continue this  - use albuterol as needed -consider pulmnary rehab if needed  ILD (interstitial lung disease) ([place]) - reseolved on HRCT Jan 2018 - you do not have this problem  Nodule of right lung 101m RUL with history of smoking - June 2017  - resolved on CT chest Jan 2018   Smoking  - try to quit smoking asap   Followup - oct 2018 or sooner if neede  Dr. MBrand Males M.D., FMatagorda Regional Medical CenterC.P Pulmonary and Critical Care Medicine Staff Physician CRocky RiverPulmonary and Critical Care Pager: [phone] If  no answer or between  15:00h - 7:00h: call [phone]  08/14/2016 1:57 PM

## 2016-08-15 NOTE — Progress Notes (Signed)
Discussed at [place]. Nothing further needed at this time.

## 2016-08-16 ENCOUNTER — Emergency Department (HOSPITAL_COMMUNITY): Payer: Medicare Other

## 2016-08-16 ENCOUNTER — Encounter (HOSPITAL_COMMUNITY): Payer: Self-pay | Admitting: Emergency Medicine

## 2016-08-16 ENCOUNTER — Emergency Department (HOSPITAL_COMMUNITY)
Admission: EM | Admit: 2016-08-16 | Discharge: 2016-08-17 | Disposition: A | Discharge status: Home / Self Care | Payer: Medicare Other | Attending: Emergency Medicine | Admitting: Emergency Medicine

## 2016-08-16 DIAGNOSIS — Y939 Activity, unspecified: Secondary | ICD-10-CM | POA: Diagnosis not present

## 2016-08-16 DIAGNOSIS — Y929 Unspecified place or not applicable: Secondary | ICD-10-CM | POA: Diagnosis not present

## 2016-08-16 DIAGNOSIS — W109XXA Fall (on) (from) unspecified stairs and steps, initial encounter: Secondary | ICD-10-CM | POA: Diagnosis not present

## 2016-08-16 DIAGNOSIS — W19XXXA Unspecified fall, initial encounter: Secondary | ICD-10-CM

## 2016-08-16 DIAGNOSIS — J449 Chronic obstructive pulmonary disease, unspecified: Secondary | ICD-10-CM | POA: Diagnosis not present

## 2016-08-16 DIAGNOSIS — S82025A Nondisplaced longitudinal fracture of left patella, initial encounter for closed fracture: Secondary | ICD-10-CM | POA: Insufficient documentation

## 2016-08-16 DIAGNOSIS — S80212A Abrasion, left knee, initial encounter: Secondary | ICD-10-CM | POA: Insufficient documentation

## 2016-08-16 DIAGNOSIS — S40012A Contusion of left shoulder, initial encounter: Secondary | ICD-10-CM | POA: Insufficient documentation

## 2016-08-16 DIAGNOSIS — S82092A Other fracture of left patella, initial encounter for closed fracture: Secondary | ICD-10-CM | POA: Diagnosis not present

## 2016-08-16 DIAGNOSIS — F17210 Nicotine dependence, cigarettes, uncomplicated: Secondary | ICD-10-CM | POA: Insufficient documentation

## 2016-08-16 DIAGNOSIS — S0990XA Unspecified injury of head, initial encounter: Secondary | ICD-10-CM

## 2016-08-16 DIAGNOSIS — Y999 Unspecified external cause status: Secondary | ICD-10-CM | POA: Insufficient documentation

## 2016-08-16 MED ORDER — OXYCODONE-ACETAMINOPHEN 5-325 MG PO TABS
2.0000 | ORAL_TABLET | Freq: Once | ORAL | Status: AC
  Administered 2016-08-16: 2 via ORAL
  Filled 2016-08-16: qty 2

## 2016-08-16 MED ORDER — BACITRACIN ZINC 500 UNIT/GM EX OINT
TOPICAL_OINTMENT | CUTANEOUS | Status: AC
  Filled 2016-08-16: qty 0.9

## 2016-08-16 NOTE — ED Provider Notes (Signed)
[facility] DEPT Provider Note   CSN: [id number] Arrival date & time: 08/16/16  2027  By signing my name below, I, Jeanell Sparrow, attest that this documentation has been prepared under the direction and in the presence of Nat Christen, MD. Electronically Signed: Jeanell Sparrow, Scribe. 08/16/2016. 9:08 PM.  History   Chief Complaint Chief Complaint  Patient presents with  . Fall   The history is provided by the patient. A language interpreter was used.   HPI Comments: Kayla Sawyer is a 71 y.o. female who presents to the Emergency Department complaining of a fall that occurred about 10 hours. She states she left the bank and missed a step fell onto the left side of her body, including her head. She denies any LOC. She has associated constant moderate left temporal headache and left knee pain. She took 2 tylenol PTA with some relief. Her left knee pain is exacerbated by weight bearing. She is currently on aspirin and no other anticoagulant. She denies any other complaints.     PCP: Wende Neighbors, MD  Past Medical History:  Diagnosis Date  . Arthritis   . COPD (chronic obstructive pulmonary disease) ([place])   . Depression   . E. coli pyelonephritis 6//2014  . GERD (gastroesophageal reflux disease)   . Pneumonia   . Pulmonary hypertension   . Sepsis [facility])     Patient Active Problem List   Diagnosis Date Noted  . Status post aortobifemoral bypass surgery   . Acute respiratory failure with hypoxia ([place])   . Aortoiliac occlusive disease ([place]) 06/14/2016  . Nodule of right lung 03/30/2016  . Smoking greater than 40 pack years 03/30/2016  . PAD (peripheral artery disease) ([place]) 02/11/2016  . Scleroderma ([place]) 02/11/2016  . Centrilobular emphysema ([place]) 01/07/2016  . ILD (interstitial lung disease) ([place]) 01/07/2016  . Physical deconditioning 01/07/2016  . Stopped smoking with greater than 40 pack year history 01/07/2016  . Dyspnea   . DOE (dyspnea on exertion) 12/27/2015  . Coronary  artery calcification seen on CAT scan 12/27/2015  . Chest pain at rest 12/27/2015  . Abnormal PFTs 12/27/2015  . Claudication of both lower extremities ([place]) 12/27/2015  . E. coli pyelonephritis 01/03/2013  . Acute renal failure ([place]) 01/01/2013  . E coli bacteremia 12/31/2012  . Failure to thrive in adult 12/30/2012  . Leukocytosis, unspecified 12/30/2012  . COPD (chronic obstructive pulmonary disease) ([place]) 12/30/2012  . TOBACCO USE 01/07/2010  . ULNAR NEUROPATHY, LEFT 01/07/2010  . GERD 01/07/2010  . LOW BACK PAIN 01/07/2010  . COLONIC POLYPS, HX OF 01/07/2010    Past Surgical History:  Procedure Laterality Date  . ABDOMINAL HYSTERECTOMY     partial  . AORTA - BILATERAL FEMORAL ARTERY BYPASS GRAFT Bilateral 06/14/2016   Procedure: AORTOBIFEMORAL BYPASS GRAFT AORTA SMA BYPASS GRAFT RE-IMPLANTATION  OF SUPERIOR MESENTERIC ARTERY;  Surgeon: Serafina Mitchell, MD;  Location: [facility];  Service: Vascular;  Laterality: Bilateral;  . BACK SURGERY     L4/5  . CARDIAC CATHETERIZATION N/A 01/03/2016   Procedure: Right/Left Heart Cath and Coronary Angiography;  Surgeon: Jolaine Artist, MD;  Location: [facility];  Service: Cardiovascular;  Laterality: N/A;  . COLONOSCOPY N/A 08/05/2015   Procedure: COLONOSCOPY;  Surgeon: Rogene Houston, MD;  [facility];  Service: Endoscopy;  Laterality: N/A;  730  . ELBOW SURGERY    . PERIPHERAL VASCULAR CATHETERIZATION N/A 02/16/2016   Procedure: Abdominal Aortogram w/Lower Extremity;  Surgeon: Wellington Hampshire, MD;  Location: [facility];  Service: Cardiovascular;  Laterality: N/A;    OB History    No data available       Home Medications    Prior to Admission medications   Medication Sig Start Date End Date Taking? Authorizing Provider  acetaminophen (TYLENOL) 500 MG tablet Take 500 mg by mouth every 6 (six) hours as needed for mild pain.   Yes Historical Provider, MD  aspirin EC 81 MG tablet Take 1 tablet (81 mg total)  by mouth daily. 02/08/16  Yes Wellington Hampshire, MD  atorvastatin (LIPITOR) 20 MG tablet Take 1 tablet (20 mg total) by mouth daily. 02/08/16 06/01/17 Yes Wellington Hampshire, MD  cholecalciferol (VITAMIN D) 1000 units tablet Take 1,000 Units by mouth daily.   Yes Historical Provider, MD  Multiple Vitamin (MULTIVITAMIN WITH MINERALS) TABS tablet Take 1 tablet by mouth daily.   Yes Historical Provider, MD  omeprazole (PRILOSEC) 20 MG capsule Take 20 mg by mouth daily.  05/13/15  Yes Historical Provider, MD  sertraline (ZOLOFT) 50 MG tablet Take 50 mg by mouth daily. 04/19/16  Yes Historical Provider, MD  Tiotropium Bromide Monohydrate (SPIRIVA RESPIMAT) 2.5 MCG/ACT AERS Inhale 2 puffs into the lungs daily. 03/30/16  Yes Brand Males, MD  oxyCODONE-acetaminophen (PERCOCET) 5-325 MG tablet Take 1-2 tablets by mouth every 4 (four) hours as needed. 08/17/16   Nat Christen, MD    Family History Family History  Problem Relation Age of Onset  . Diabetes Mother   . Emphysema Father     Social History Social History  Substance Use Topics  . Smoking status: Current Every Day Smoker    Packs/day: 1.00    Years: 50.00    Types: Cigarettes  . Smokeless tobacco: Never Used     Comment: 1/2-3/4 ppd 1.29.18  . Alcohol use No     Allergies   Sulfur   Review of Systems Review of Systems A complete 10 system review of systems was obtained and all systems are negative except as noted in the HPI and PMH.   Physical Exam Updated Vital Signs BP (!) 89/64   Pulse 106   Temp 98 F (36.7 C)   Resp 20   Ht '5\' 8"'$  (1.727 m)   Wt 130 lb (59 kg)   SpO2 94%   BMI 19.77 kg/m   Physical Exam  Constitutional: She is oriented to person, place, and time. She appears well-developed and well-nourished.  HENT:  Head: Normocephalic.  Minimal TTP to the left temporal area.   Eyes: Conjunctivae are normal.  Neck: Neck supple.  Cardiovascular: Normal rate and regular rhythm.   Pulmonary/Chest: Effort normal  and breath sounds normal.  Abdominal: Soft. Bowel sounds are normal.  Musculoskeletal: Normal range of motion.  Superficial abrasion to left knee with TTP.  Ecchymosis on the tip of left shoulder. Full ROM.   Neurological: She is alert and oriented to person, place, and time.  Skin: Skin is warm and dry.  Psychiatric: She has a normal mood and affect. Her behavior is normal.  Nursing note and vitals reviewed.    ED Treatments / Results  DIAGNOSTIC STUDIES: Oxygen Saturation is 94% on RA, normal by my interpretation.    COORDINATION OF CARE: 9:12 PM- Pt advised of plan for treatment, which includes a head CT and left knee CT, and pt agrees.  Labs (all labs ordered are listed, but only abnormal results are displayed) Labs Reviewed - No data to display  EKG  EKG Interpretation  Date/Time:  Wednesday August 16 2016 20:54:54 EST Ventricular Rate:  91 PR Interval:    QRS Duration: 85 QT Interval:  393 QTC Calculation: 484 R Axis:   87 Text Interpretation:  Sinus rhythm Right atrial enlargement Anteroseptal infarct, age indeterminate poor baseline, grossly uncahnged  Confirmed by YAO  MD, DAVID ([id number]) on 08/17/2016 3:47:16 PM       Radiology Ct Head Wo Contrast  Result Date: 08/16/2016 CLINICAL DATA:  Missed a step and fell onto her left side, striking her head. EXAM: CT HEAD WITHOUT CONTRAST TECHNIQUE: Contiguous axial images were obtained from the base of the skull through the vertex without intravenous contrast. COMPARISON:  07/07/2011 FINDINGS: Brain: There is no intracranial hemorrhage, mass or evidence of acute infarction. There is no extra-axial fluid collection. Gray matter and white matter appear normal. Cerebral volume is normal for age. Brainstem and posterior fossa are unremarkable. The CSF spaces appear normal. Vascular: No hyperdense vessel or unexpected calcification. Skull: Normal. Negative for fracture or focal lesion. Sinuses/Orbits: No acute finding. Other: None.  IMPRESSION: Normal brain Electronically Signed   By: Andreas Newport M.D.   On: 08/16/2016 22:32   Dg Knee Complete 4 Views Left  Result Date: 08/16/2016 CLINICAL DATA:  Acute onset of left knee pain and abrasion after fall in parking lot. Initial encounter. EXAM: LEFT KNEE - COMPLETE 4+ VIEW COMPARISON:  None. FINDINGS: There is a minimally displaced vertical fracture through the lateral aspect of the patella. No significant degenerative change is seen; the patellofemoral joint is grossly unremarkable in appearance. A small knee joint effusion is noted. Mild vascular calcification is seen. IMPRESSION: 1. Minimally displaced vertical fracture through the lateral aspect of the patella. 2. Small knee joint effusion noted. 3. Mild vascular calcifications seen. Electronically Signed   By: Garald Balding M.D.   On: 08/16/2016 22:21    Procedures Procedures (including critical care time)  Medications Ordered in ED Medications  oxyCODONE-acetaminophen (PERCOCET/ROXICET) 5-325 MG per tablet 2 tablet (2 tablets Oral Given 08/16/16 2341)     Initial Impression / Assessment and Plan / ED Course  I have reviewed the triage vital signs and the nursing notes.  Pertinent labs & imaging results that were available during my care of the patient were reviewed by me and considered in my medical decision making (see chart for details).     Status post accidental mechanical fall with linear vertical fracture of the left lateral patella. Will immobilize and refer to orthopedics  Final Clinical Impressions(s) / ED Diagnoses   Final diagnoses:  Fall, initial encounter  Minor head injury, initial encounter  Closed nondisplaced longitudinal fracture of left patella, initial encounter    New Prescriptions Discharge Medication List as of 08/17/2016 12:08 AM    START taking these medications   Details  oxyCODONE-acetaminophen (PERCOCET) 5-325 MG tablet Take 1-2 tablets by mouth every 4 (four) hours as  needed., Starting Thu 08/17/2016, Print       I personally performed the services described in this documentation, which was scribed in my presence. The recorded information has been reviewed and is accurate.      Nat Christen, MD 08/18/16 1410

## 2016-08-16 NOTE — ED Triage Notes (Signed)
Pt fell this am while leaving the bank. Pt states she missed a step and fell. Pt states the left side of her struck side walk. Pt c/o left knee and shoulder.

## 2016-08-17 ENCOUNTER — Telehealth: Payer: Self-pay | Admitting: Orthopedic Surgery

## 2016-08-17 MED ORDER — OXYCODONE-ACETAMINOPHEN 5-325 MG PO TABS: 1.0000 | ORAL_TABLET | ORAL | 0 refills | Status: DC | PRN

## 2016-08-17 NOTE — Telephone Encounter (Signed)
Patient's husband came in to make an Follow up appointment with Dr. Aline Brochure. Patient had been to the ER Dept 08/16/16 and was referred to [facility]. I explained to him that Dr. Aline Brochure was not in the office today and that the ER had sent a summary to [facility] in regard to his wife's condition. I explained also that Dr. Ruthe Mannan schedule is really full  and I felt like it would be best in her interest to contact [place]. He was in agreement and thank me. I spoke with Arbie Cookey here in the office and she agreed that it would probably be best for them to contact [facility].

## 2016-08-17 NOTE — Discharge Instructions (Signed)
Head scan was normal. You have fractured your left patella or knee cap.  Follow-up with orthopedics. Knee immobilizer, use yourwalker. ice, elevate, pain medication

## 2016-08-18 DIAGNOSIS — M25562 Pain in left knee: Secondary | ICD-10-CM | POA: Diagnosis not present

## 2016-08-18 DIAGNOSIS — S82022A Displaced longitudinal fracture of left patella, initial encounter for closed fracture: Secondary | ICD-10-CM | POA: Diagnosis not present

## 2016-08-31 DIAGNOSIS — J209 Acute bronchitis, unspecified: Secondary | ICD-10-CM | POA: Diagnosis not present

## 2016-08-31 DIAGNOSIS — Z6821 Body mass index (BMI) 21.0-21.9, adult: Secondary | ICD-10-CM | POA: Diagnosis not present

## 2016-08-31 DIAGNOSIS — J441 Chronic obstructive pulmonary disease with (acute) exacerbation: Secondary | ICD-10-CM | POA: Diagnosis not present

## 2016-09-09 DIAGNOSIS — J209 Acute bronchitis, unspecified: Secondary | ICD-10-CM | POA: Diagnosis not present

## 2016-09-09 DIAGNOSIS — J449 Chronic obstructive pulmonary disease, unspecified: Secondary | ICD-10-CM | POA: Diagnosis not present

## 2016-09-13 DIAGNOSIS — M25562 Pain in left knee: Secondary | ICD-10-CM | POA: Diagnosis not present

## 2016-09-13 DIAGNOSIS — S82022D Displaced longitudinal fracture of left patella, subsequent encounter for closed fracture with routine healing: Secondary | ICD-10-CM | POA: Diagnosis not present

## 2016-10-16 DIAGNOSIS — R7301 Impaired fasting glucose: Secondary | ICD-10-CM | POA: Diagnosis not present

## 2016-10-16 DIAGNOSIS — I10 Essential (primary) hypertension: Secondary | ICD-10-CM | POA: Diagnosis not present

## 2016-10-16 DIAGNOSIS — E782 Mixed hyperlipidemia: Secondary | ICD-10-CM | POA: Diagnosis not present

## 2016-10-18 DIAGNOSIS — E782 Mixed hyperlipidemia: Secondary | ICD-10-CM | POA: Diagnosis not present

## 2016-10-18 DIAGNOSIS — R7301 Impaired fasting glucose: Secondary | ICD-10-CM | POA: Diagnosis not present

## 2016-10-18 DIAGNOSIS — K219 Gastro-esophageal reflux disease without esophagitis: Secondary | ICD-10-CM | POA: Diagnosis not present

## 2016-10-18 DIAGNOSIS — I739 Peripheral vascular disease, unspecified: Secondary | ICD-10-CM | POA: Diagnosis not present

## 2016-10-19 ENCOUNTER — Other Ambulatory Visit (HOSPITAL_COMMUNITY): Payer: Self-pay | Admitting: Internal Medicine

## 2016-10-19 DIAGNOSIS — Z1231 Encounter for screening mammogram for malignant neoplasm of breast: Secondary | ICD-10-CM

## 2016-10-26 ENCOUNTER — Encounter: Payer: Self-pay | Admitting: Family

## 2016-11-01 ENCOUNTER — Ambulatory Visit (HOSPITAL_COMMUNITY)
Admission: RE | Admit: 2016-11-01 | Discharge: 2016-11-01 | Disposition: A | Discharge status: Home / Self Care | Payer: Medicare Other | Source: Ambulatory Visit | Attending: Internal Medicine | Admitting: Internal Medicine

## 2016-11-01 DIAGNOSIS — Z1231 Encounter for screening mammogram for malignant neoplasm of breast: Secondary | ICD-10-CM | POA: Diagnosis not present

## 2016-11-07 ENCOUNTER — Ambulatory Visit (INDEPENDENT_AMBULATORY_CARE_PROVIDER_SITE_OTHER): Payer: Medicare Other | Admitting: Family

## 2016-11-07 ENCOUNTER — Ambulatory Visit (HOSPITAL_COMMUNITY)
Admission: RE | Admit: 2016-11-07 | Discharge: 2016-11-07 | Disposition: A | Discharge status: Home / Self Care | Payer: Medicare Other | Source: Ambulatory Visit | Attending: Family | Admitting: Family

## 2016-11-07 ENCOUNTER — Encounter: Payer: Self-pay | Admitting: Family

## 2016-11-07 VITALS — BP 104/66 | HR 90 | Temp 97.6°F | Resp 18 | Ht 68.0 in | Wt 129.0 lb

## 2016-11-07 DIAGNOSIS — Z95828 Presence of other vascular implants and grafts: Secondary | ICD-10-CM

## 2016-11-07 DIAGNOSIS — I739 Peripheral vascular disease, unspecified: Secondary | ICD-10-CM | POA: Insufficient documentation

## 2016-11-07 DIAGNOSIS — I7409 Other arterial embolism and thrombosis of abdominal aorta: Secondary | ICD-10-CM | POA: Diagnosis not present

## 2016-11-07 DIAGNOSIS — R109 Unspecified abdominal pain: Secondary | ICD-10-CM | POA: Insufficient documentation

## 2016-11-07 DIAGNOSIS — I70213 Atherosclerosis of native arteries of extremities with intermittent claudication, bilateral legs: Secondary | ICD-10-CM | POA: Diagnosis not present

## 2016-11-07 DIAGNOSIS — I708 Atherosclerosis of other arteries: Secondary | ICD-10-CM | POA: Insufficient documentation

## 2016-11-07 DIAGNOSIS — K551 Chronic vascular disorders of intestine: Secondary | ICD-10-CM | POA: Diagnosis not present

## 2016-11-07 DIAGNOSIS — F17200 Nicotine dependence, unspecified, uncomplicated: Secondary | ICD-10-CM

## 2016-11-07 NOTE — Progress Notes (Signed)
[facility] [place]   CC: Follow up peripheral artery occlusive disease  History of Present Illness Kayla Sawyer is a 71 y.o. female who returns today for follow-up.   On 06/14/2016 she underwent an aortobifemoral bypass graft using a 14 x 7 bifurcated graft by Dr. Trula Slade. He also reimplanted the inferior mesenteric artery and did an aorta to superior mesenteric artery bypass graft using 7 mm dacryon.  Her operation was done for bilateral claudication.  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  Her weight today is 129#.  She denies post prandial abdominal pain. She is walking long distances with no claudication symptoms.   She denies any known history of stroke or TIA.   The patient has a 50+-pack-year history of smoking and has COPD. She is followed by a pulmonologist and is also suspected to have scleroderma which is followed by Dr. Trudie Reed. She recently had a cardiac catheterization which showed mild obstructive disease and no pulmonary hypertension. She suffers from hypercholesterolemia which is managed with a statin. She is on a baby aspirin for antiplatelet therapy.  Dr. Trula Slade evaluated pt on 08-07-16. At that time her weight was 130#. Her femoral pulses were palpable, midline incision was well-healed without hernia, there was a small left groin seroma. She was back to almost normal activity.  Her only postoperative issue was a small seroma in the left groin which we will continue to evaluate.  Dr. Trula Slade did not think that she will need any kind of intervention as she states that this has gotten smaller since surgery.    She returns today for 3 months follow up with duplex.  Pt Diabetic: No Pt smoker: smoker  (1/2 ppd x 50 yrs)  Pt meds include: Statin :Yes Betablocker: No ASA: Yes Other anticoagulants/antiplatelets: no   Past Medical History:  Diagnosis Date  . Arthritis   . COPD (chronic obstructive pulmonary disease)  ([place])   . Depression   . E. coli pyelonephritis 6//2014  . GERD (gastroesophageal reflux disease)   . Pneumonia   . Pulmonary hypertension ([place])   . Sepsis [facility])     Social History Social History  Substance Use Topics  . Smoking status: Current Every Day Smoker    Packs/day: 0.50    Years: 50.00    Types: Cigarettes  . Smokeless tobacco: Never Used     Comment: 1/2-3/4 ppd 1.29.18  . Alcohol use No    Family History Family History  Problem Relation Age of Onset  . Diabetes Mother   . Emphysema Father     Past Surgical History:  Procedure Laterality Date  . ABDOMINAL HYSTERECTOMY     partial  . AORTA - BILATERAL FEMORAL ARTERY BYPASS GRAFT Bilateral 06/14/2016   Procedure: AORTOBIFEMORAL BYPASS GRAFT AORTA SMA BYPASS GRAFT RE-IMPLANTATION  OF SUPERIOR MESENTERIC ARTERY;  Surgeon: Serafina Mitchell, MD;  Location: [facility];  Service: Vascular;  Laterality: Bilateral;  . BACK SURGERY     L4/5  . CARDIAC CATHETERIZATION N/A 01/03/2016   Procedure: Right/Left Heart Cath and Coronary Angiography;  Surgeon: Jolaine Artist, MD;  Location: [place] [facility];  Service: Cardiovascular;  Laterality: N/A;  . COLONOSCOPY N/A 08/05/2015   Procedure: COLONOSCOPY;  Surgeon: Rogene Houston, MD;  [facility];  Service: Endoscopy;  Laterality: N/A;  730  . ELBOW SURGERY    . PERIPHERAL VASCULAR CATHETERIZATION N/A 02/16/2016   Procedure: Abdominal Aortogram w/Lower Extremity;  Surgeon: Mertie Clause  Fletcher Anon, MD;  Location: [facility];  Service: Cardiovascular;  Laterality: N/A;    Allergies  Allergen Reactions  . Sulfur Nausea Only    Current Outpatient Prescriptions  Medication Sig Dispense Refill  . acetaminophen (TYLENOL) 500 MG tablet Take 500 mg by mouth every 6 (six) hours as needed for mild pain.    Marland Kitchen aspirin EC 81 MG tablet Take 1 tablet (81 mg total) by mouth daily.    Marland Kitchen atorvastatin (LIPITOR) 20 MG tablet Take 1 tablet (20 mg total) by mouth daily. 90 tablet 3   . cholecalciferol (VITAMIN D) 1000 units tablet Take 1,000 Units by mouth daily.    Marland Kitchen levocetirizine (XYZAL) 5 MG tablet     . Multiple Vitamin (MULTIVITAMIN WITH MINERALS) TABS tablet Take 1 tablet by mouth daily.    Marland Kitchen omeprazole (PRILOSEC) 20 MG capsule Take 20 mg by mouth daily.     Marland Kitchen oxyCODONE-acetaminophen (PERCOCET) 5-325 MG tablet Take 1-2 tablets by mouth every 4 (four) hours as needed. 20 tablet 0  . sertraline (ZOLOFT) 50 MG tablet Take 50 mg by mouth daily.    . Tiotropium Bromide Monohydrate (SPIRIVA RESPIMAT) 2.5 MCG/ACT AERS Inhale 2 puffs into the lungs daily. 3 Inhaler 4   No current facility-administered medications for this visit.     ROS: See HPI for pertinent positives and negatives.   Physical Examination  Vitals:   11/07/16 1011  BP: 104/66  Pulse: 90  Resp: 18  Temp: 97.6 F (36.4 C)  TempSrc: Oral  SpO2: 94%  Weight: 129 lb (58.5 kg)  Height: '5\' 8"'$  (1.727 m)   Body mass index is 19.61 kg/m.  General: A&O x 3, WDWN, thin female. Excessive facial and neck wrinkling. Odor of cigarette smoke.  Gait: normal Eyes: PERRLA. Pulmonary: Respirations are non labored, CTAB, limited air movement Cardiac: regular rhythm, no detected murmur.         Carotid Bruits Right Left   Negative Negative  Aorta is faintly palpable. Radial pulses: right is not palpable, right brachial is 3+ palpable, left radial is 2+ palpable.                            VASCULAR EXAM: Extremities without ischemic changes, without Gangrene; without open wounds. There is no longer a seroma in the left groin.                                                                                                           LE Pulses Right Left       FEMORAL  2+ palpable  2+ palpable        POPLITEAL  not palpable   not palpable       POSTERIOR TIBIAL  2+ palpable   1+ palpable        DORSALIS PEDIS      ANTERIOR TIBIAL not palpable  not palpable    Abdomen: soft, NT, no palpable  masses. Skin: no rashes, no ulcers noted. Musculoskeletal: no muscle  wasting or atrophy. Arthritic changes in her hands.  Neurologic: A&O X 3; Appropriate Affect ; SENSATION: normal; MOTOR FUNCTION:  moving all extremities equally, motor strength 5/5 throughout. Speech is fluent/normal. CN 2-12 intact.   ASSESSMENT: Kayla Sawyer is a 71 y.o. female who is s/p aortobifemoral bypass graft using a 14 x 7 bifurcated graft, reimplantation of the inferior mesenteric artery, and an aorta to superior mesenteric artery bypass graft using 7 mm dacryon.  She has no post prandial abdominal pain and her weight in unchanged in 3 months since her last visit.  She is walking long distances with no claudication.   Fortunately she does not have DM has no known CAD, and her BMI is low, but unfortunately she continues to smoke; see Plan.  DATA  Mesenteric artery duplex today: Decreased visualization of the abdominal vasculature due to overlying bowel gas. Difficult to distinguish, but the elevated velocities appear to be in the celiac artery. PSA in the celiac pre stenotic is 164 cm/s, and in the stenotic area PSA is 365 cm/s. Celiac artery appears tortuous and has calcified plaque.  Widely patent SMA bypass w/o evidence of restenosis or hyperplasia.   Aortobifem bypass graft duplex today: Decreased visualization of the abdominal vasculature due to overlying bowel gas.  Evidence of elevated velocities in the right EIA outflow (231 cm/s) due to plaque. No prior exam for comparison.   ABI today: Right: 1.08 (0.69, 12-31-15), waveforms: PT: triphasic, DP: biphasic, TBI: 0.62 Left: 1.12 (0.45, 12-31-15), waveforms: PT: triphasic, DP: biphasic; TBI: 0.72  12-31-15 Carotid Duplex: Findings suggest 1-39% internal carotid artery stenosis bilaterally. Vertebral arteries are patent with antegrade flow.   PLAN:  The patient was counseled re smoking cessation and given several free resources re smoking  cessation.  Based on the patient's vascular studies and examination, and after discussing with Dr. Donnetta Hutching, pt will return to clinic in 1 year with aortoiliac duplex, mesenteric artery duplex, and ABI's.  I advised pt and her husband to notify us if she develops concerns re the circulation in her feet or legs, and if she develops post prandial abdominal pain.   I discussed in depth with the patient the nature of atherosclerosis, and emphasized the importance of maximal medical management including strict control of blood pressure, blood glucose, and lipid levels, obtaining regular exercise, and cessation of smoking.  The patient is aware that without maximal medical management the underlying atherosclerotic disease process will progress, limiting the benefit of any interventions.  The patient was given information about PAD including signs, symptoms, treatment, what symptoms should prompt the patient to seek immediate medical care, and risk reduction measures to take.  Clemon Chambers, RN, MSN, FNP-C [facility] [employer] [facility] Phone: [phone]  Clinic MD: Early  11/07/16 11:07 AM

## 2016-11-07 NOTE — Patient Instructions (Addendum)
Peripheral Vascular Disease Peripheral vascular disease (PVD) is a disease of the blood vessels that are not part of your heart and brain. A simple term for PVD is poor circulation. In most cases, PVD narrows the blood vessels that carry blood from your heart to the rest of your body. This can result in a decreased supply of blood to your arms, legs, and internal organs, like your stomach or kidneys. However, it most often affects a person's lower legs and feet. There are two types of PVD.  Organic PVD. This is the more common type. It is caused by damage to the structure of blood vessels.  Functional PVD. This is caused by conditions that make blood vessels contract and tighten (spasm). Without treatment, PVD tends to get worse over time. PVD can also lead to acute ischemic limb. This is when an arm or limb suddenly has trouble getting enough blood. This is a medical emergency. Follow these instructions at home:  Take medicines only as told by your doctor.  Do not use any tobacco products, including cigarettes, chewing tobacco, or electronic cigarettes. If you need help quitting, ask your doctor.  Lose weight if you are overweight, and maintain a healthy weight as told by your doctor.  Eat a diet that is low in fat and cholesterol. If you need help, ask your doctor.  Exercise regularly. Ask your doctor for some good activities for you.  Take good care of your feet.  Wear comfortable shoes that fit well.  Check your feet often for any cuts or sores. Contact a doctor if:  You have cramps in your legs while walking.  You have leg pain when you are at rest.  You have coldness in a leg or foot.  Your skin changes.  You are unable to get or have an erection (erectile dysfunction).  You have cuts or sores on your feet that are not healing. Get help right away if:  Your arm or leg turns cold and blue.  Your arms or legs become red, warm, swollen, painful, or numb.  You have  chest pain or trouble breathing.  You suddenly have weakness in your face, arm, or leg.  You become very confused or you cannot speak.  You suddenly have a very bad headache.  You suddenly cannot see. This information is not intended to replace advice given to you by your health care provider. Make sure you discuss any questions you have with your health care provider. Document Released: 09/27/2009 Document Revised: 12/09/2015 Document Reviewed: 12/11/2013 Elsevier Interactive Patient Education  2017 [employer].     Steps to Quit Smoking Smoking tobacco can be bad for your health. It can also affect almost every organ in your body. Smoking puts you and people around you at risk for many serious long-lasting (chronic) diseases. Quitting smoking is hard, but it is one of the best things that you can do for your health. It is never too late to quit. What are the benefits of quitting smoking? When you quit smoking, you lower your risk for getting serious diseases and conditions. They can include:  Lung cancer or lung disease.  Heart disease.  Stroke.  Heart attack.  Not being able to have children (infertility).  Weak bones (osteoporosis) and broken bones (fractures). If you have coughing, wheezing, and shortness of breath, those symptoms may get better when you quit. You may also get sick less often. If you are pregnant, quitting smoking can help to lower your chances of  having a baby of low birth weight. What can I do to help me quit smoking? Talk with your doctor about what can help you quit smoking. Some things you can do (strategies) include:  Quitting smoking totally, instead of slowly cutting back how much you smoke over a period of time.  Going to in-person counseling. You are more likely to quit if you go to many counseling sessions.  Using resources and support systems, such as:  Online chats with a Social worker.  Phone quitlines.  Printed Social research officer, government.  Support groups or group counseling.  Text messaging programs.  Mobile phone apps or applications.  Taking medicines. Some of these medicines may have nicotine in them. If you are pregnant or breastfeeding, do not take any medicines to quit smoking unless your doctor says it is okay. Talk with your doctor about counseling or other things that can help you. Talk with your doctor about using more than one strategy at the same time, such as taking medicines while you are also going to in-person counseling. This can help make quitting easier. What things can I do to make it easier to quit? Quitting smoking might feel very hard at first, but there is a lot that you can do to make it easier. Take these steps:  Talk to your family and friends. Ask them to support and encourage you.  Call phone quitlines, reach out to support groups, or work with a Social worker.  Ask people who smoke to not smoke around you.  Avoid places that make you want (trigger) to smoke, such as:  Bars.  Parties.  Smoke-break areas at work.  Spend time with people who do not smoke.  Lower the stress in your life. Stress can make you want to smoke. Try these things to help your stress:  Getting regular exercise.  Deep-breathing exercises.  Yoga.  Meditating.  Doing a body scan. To do this, close your eyes, focus on one area of your body at a time from head to toe, and notice which parts of your body are tense. Try to relax the muscles in those areas.  Download or buy apps on your mobile phone or tablet that can help you stick to your quit plan. There are many free apps, such as QuitGuide from the [employer] Office manager for Disease Control and Prevention). You can find more support from smokefree.gov and other websites. This information is not intended to replace advice given to you by your health care provider. Make sure you discuss any questions you have with your health care provider. Document Released:  04/29/2009 Document Revised: 02/29/2016 Document Reviewed: 11/17/2014 Elsevier Interactive Patient Education  2017 Else[employer] Inc.     Chronic Mesenteric Ischemia Mesenteric ischemia is poor blood flow (circulation) in the vessels that supply blood to the stomach, intestines, and liver (mesenteric organs). Chronic mesenteric ischemia, also called mesenteric angina or intestinal angina, is a long-term (chronic) condition. It happens when an artery or vein that provides blood to the mesenteric organs gradually becomes blocked or narrow, restricting the blood supply to the organs. When the blood supply is severely restricted, the mesenteric organs cannot work properly. What are the causes? This condition is commonly caused by fatty deposits that build up in an artery (plaque), which can narrow the artery and restrict blood flow. Other causes include:  Weakened areas in blood vessel walls (aneurysms).  Conditions that cause twisting or inflammation of blood vessels, such as fibromuscular dysplasia or arteritis.  A disorder in which blood  clots form in the veins (venous thrombosis).  Scarring and thickening (fibrosis) of blood vessels caused by radiation therapy.  A tear in the aorta, the body's main artery (aortic dissection).  Blood vessel problems after illegal drug use, such as use of cocaine.  Tumors in the nervous system (neurofibromatosis).  Certain autoimmune diseases, such as lupus. What increases the risk? The following factors may make you more likely to develop this condition:  Being female.  Being over age 70, especially if you have a history of heart problems.  Smoking.  Congestive heart failure.  Irregular heartbeat (arrhythmia).  Having a history of heart attack or stroke.  Diabetes.  High cholesterol.  High blood pressure (hypertension).  Being overweight or obese.  Kidney disease (renal disease) requiring dialysis. What are the signs or  symptoms? Symptoms of this condition include:  Abdomen (abdominal) pain or cramps that develop 15-60 minutes after a meal. This pain may last for 1-3 hours. Some people may develop a fear of eating because of this symptom.  Weight loss.  Diarrhea.  Bloody stool.  Nausea.  Vomiting.  Bloating.  Abdominal pain after stress or with exercise. How is this diagnosed? This condition is diagnosed based on:  Your medical history.  A physical exam.  Tests, such as:  Ultrasound.  CT scan.  Blood tests.  Urine tests.  An imaging test that involves injecting a dye into your arteries to show blood flow through blood vessels (angiogram). This can help to show if there are any blockages in the vessels that lead to the intestines.  Passing a small probe through the mouth and into the stomach to measure the output of carbon dioxide (gastric tonometry). This can help to indicate whether there is decreased blood flow to the stomach and intestines. How is this treated? This condition may be treated with:  Dietary changes such as eating smaller, low-fat, meals more frequently.  Lifestyle changes to treat underlying conditions that contribute to the disease, such as high cholesterol and high blood pressure.  Medicines to reduce blood clotting and increase blood flow.  Surgery to remove the blockage, repair arteries or veins, and restore blood flow. This may involve:  Angioplasty. This is surgery to widen the affected artery, reduce the blockage, and sometimes insert a small, mesh tube (stent).  Bypass surgery. This may be done to go around (bypass) the blockage and reconnect healthy arteries or veins.  Placing a stent in the affected area. This may be done to help keep blocked arteries open. Follow these instructions at home: Eating and drinking   Eat a heart-healthy diet. This includes fresh fruits and vegetables, whole grains, and lean proteins like chicken, fish, eggs, and  beans.  Avoid foods that contain a lot of:  Salt (sodium).  Sugar.  Saturated fat (such as red meat).  Trans fat (such as fried foods).  Stay hydrated. Drink enough fluid to keep your urine clear or pale yellow. Lifestyle   Stay active and get regular exercise as told by your health care provider. Aim for 150 minutes of moderate activity or 75 minutes of vigorous activity a week. Ask your health care provider what activities and forms of exercise are safe for you.  Maintain a healthy weight.  Work with your health care provider to manage your cholesterol.  Manage any other health problems you have, such as high blood pressure, diabetes, or heart rhythm problems.  Do not use any products that contain nicotine or tobacco, such as cigarettes and  e-cigarettes. If you need help quitting, ask your health care provider. General instructions   Take over-the-counter and prescription medicines only as told by your health care provider.  Keep all follow-up visits as told by your health care provider. This is important. Contact a health care provider if:  Your symptoms do not improve or they return after treatment.  You have a fever. Get help right away if:  You have severe abdominal pain.  You have severe chest pain.  You have shortness of breath.  You feel weak or dizzy.  You have palpitations.  You have numbness or weakness in your face, arm, or leg.  You are confused.  You have trouble speaking or people have trouble understanding what you are saying.  You are constipated.  You have trouble urinating.  You have blood in your stool.  You have severe nausea, vomiting, or persistent diarrhea. Summary  Mesenteric ischemia is poor circulation in the vessels that supply blood to the the stomach, intestines, and liver (mesenteric organs).  This condition happens when an artery or vein that provides blood to the mesenteric organs gradually becomes blocked or narrow,  restricting the blood supply to the organs.  This condition is commonly caused by fatty deposits that build up in an artery (plaque), which can narrow the artery and restrict blood flow.  You are more likely to develop this condition if you are over age 48 and have a history of heart problems, high blood pressure, diabetes, or high cholesterol.  This condition is usually treated with medicines, dietary and lifestyle changes, and surgery to remove the blockage, repair arteries or veins, and restore blood flow. This information is not intended to replace advice given to you by your health care provider. Make sure you discuss any questions you have with your health care provider. Document Released: 02/20/2011 Document Revised: 06/17/2016 Document Reviewed: 06/17/2016 Elsevier Interactive Patient Education  2017 [place].    Before your next abdominal ultrasound:  Take two Extra-Strength Gas-X capsules at bedtime the night before the test. Take another two Extra-Strength Gas-X capsules 3 hours before the test.

## 2016-11-08 NOTE — Addendum Note (Signed)
Addended by: Lianne Cure A on: 11/08/2016 09:25 AM   Modules accepted: Orders

## 2016-11-16 DIAGNOSIS — M791 Myalgia: Secondary | ICD-10-CM | POA: Diagnosis not present

## 2016-11-16 DIAGNOSIS — I781 Nevus, non-neoplastic: Secondary | ICD-10-CM | POA: Diagnosis not present

## 2016-11-16 DIAGNOSIS — M2550 Pain in unspecified joint: Secondary | ICD-10-CM | POA: Diagnosis not present

## 2016-11-16 DIAGNOSIS — R768 Other specified abnormal immunological findings in serum: Secondary | ICD-10-CM | POA: Diagnosis not present

## 2016-11-20 ENCOUNTER — Other Ambulatory Visit: Payer: Self-pay | Admitting: Cardiovascular Disease

## 2016-11-20 DIAGNOSIS — I739 Peripheral vascular disease, unspecified: Secondary | ICD-10-CM

## 2016-12-22 ENCOUNTER — Other Ambulatory Visit: Payer: Self-pay | Admitting: Cardiovascular Disease

## 2016-12-22 DIAGNOSIS — I739 Peripheral vascular disease, unspecified: Secondary | ICD-10-CM

## 2016-12-22 NOTE — Telephone Encounter (Signed)
Please review for refill. Thanks!  

## 2016-12-22 NOTE — Telephone Encounter (Signed)
REFILL 

## 2017-01-20 IMAGING — CT CT CHEST HIGH RESOLUTION W/O CM
2 of 4 series · 14 of 36 positions shown, 17 images · non-contrast
Comparison: Chest CT 12/30/2015.

CLINICAL DATA: 70-year-old female with history of pulmonary nodule.
Followup study. Smoker. Evaluate for interstitial lung disease.

EXAM:
CT CHEST WITHOUT CONTRAST
TECHNIQUE: Multidetector CT imaging of the chest was performed following the
standard protocol without intravenous contrast. High resolution
imaging of the lungs, as well as inspiratory and expiratory imaging,
was performed.

[Series 3: thorax · axial · 0.67mm/px · z∈[+92,+372]mm · 11 of 154 slices shown, 14 images]
[im 7/154  mediastinal]
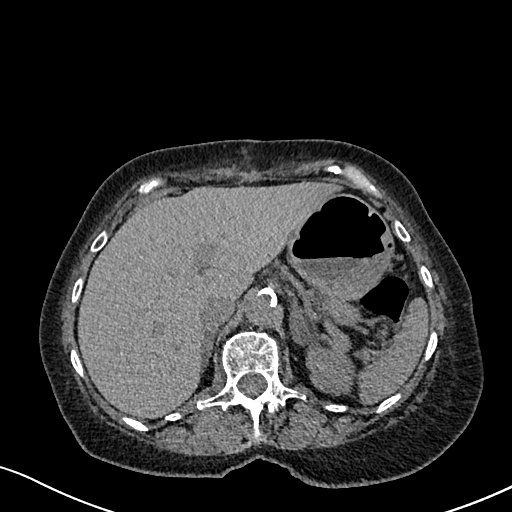
[im 7/154  lung]
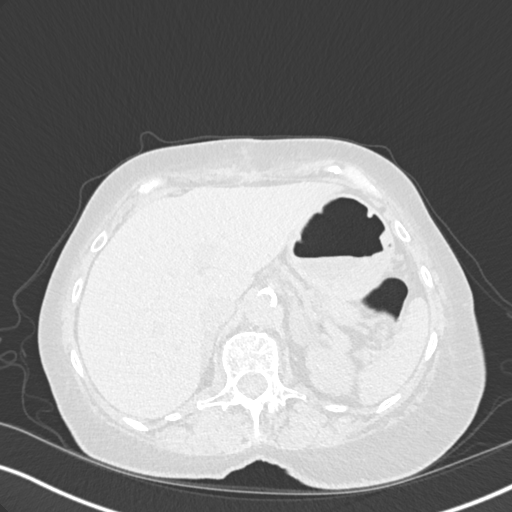
[im 21/154  lung]
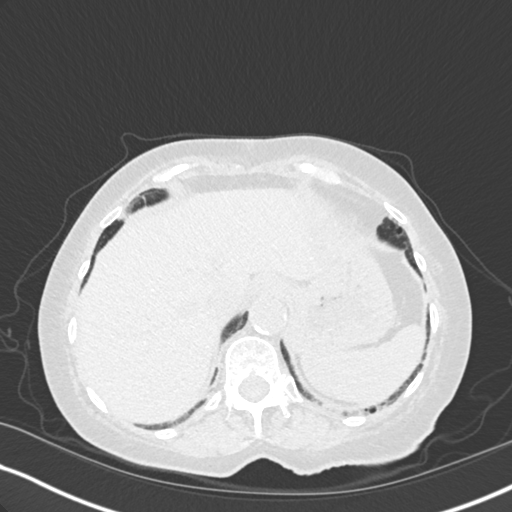
[im 35/154  lung]
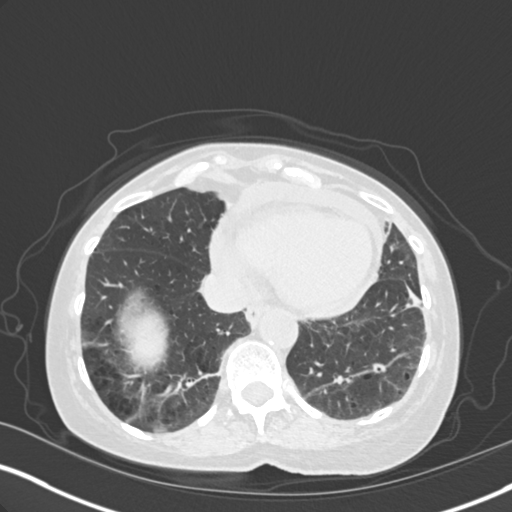
[im 49/154  lung]
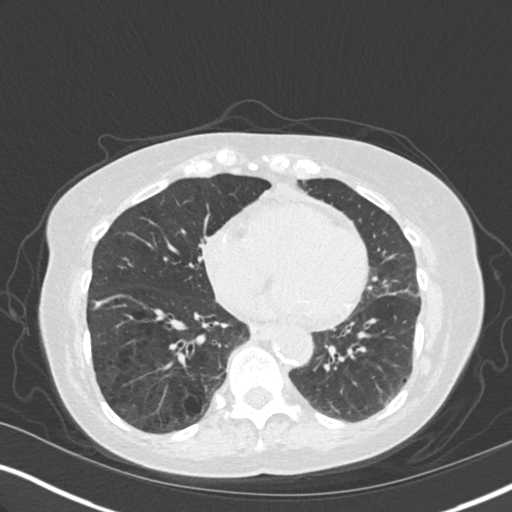
[im 63/154  mediastinal]
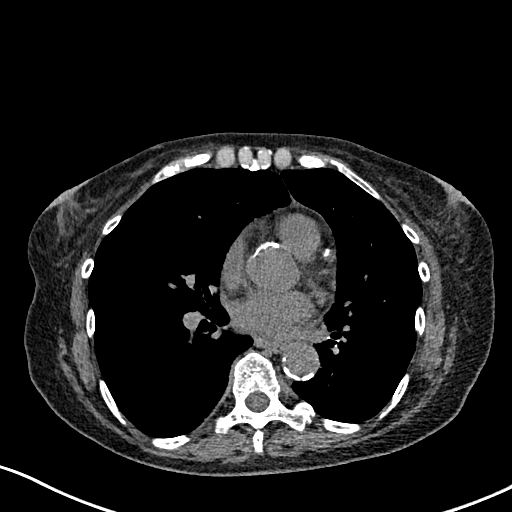
[im 63/154  lung]
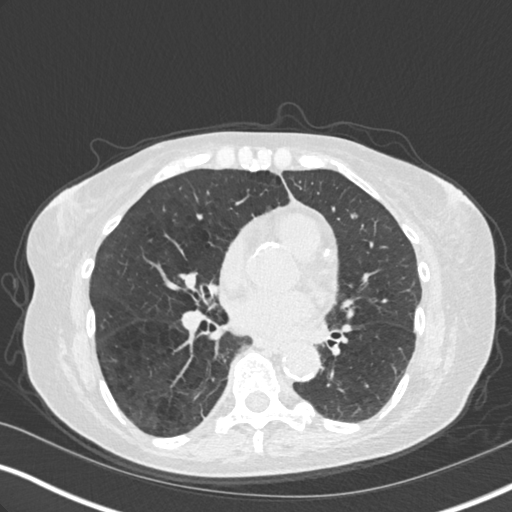
[im 77/154  lung]
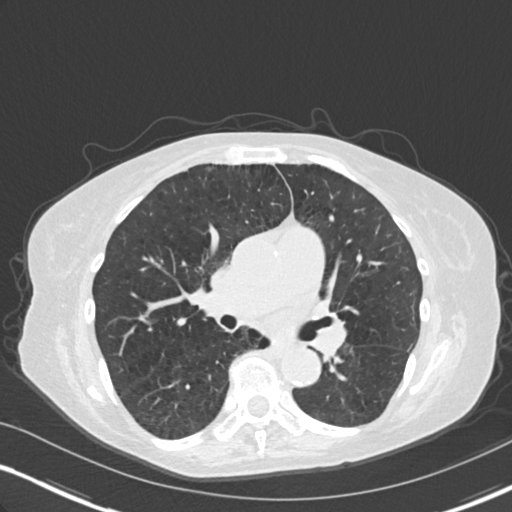
[im 91/154  lung]
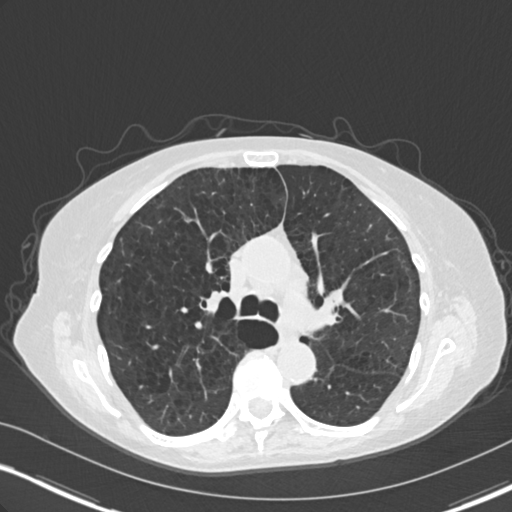
[im 105/154  lung]
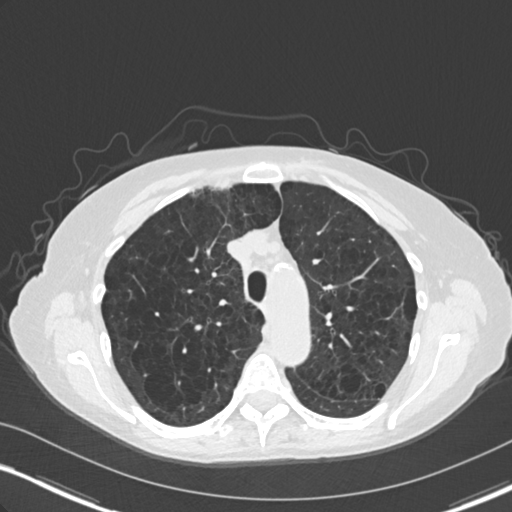
[im 119/154  mediastinal]
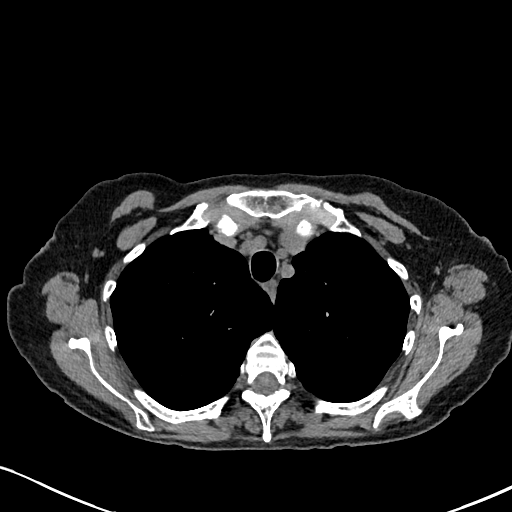
[im 119/154  lung]
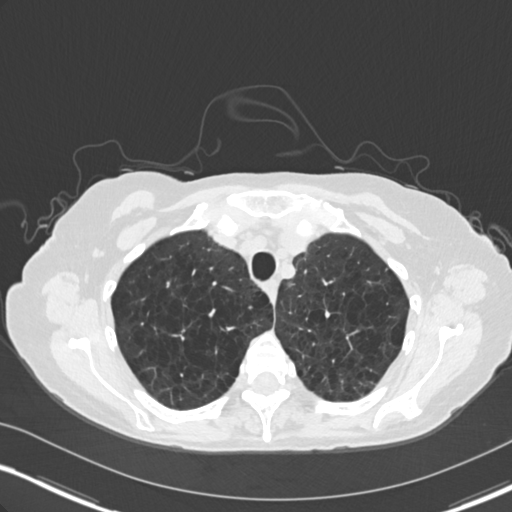
[im 133/154  lung]
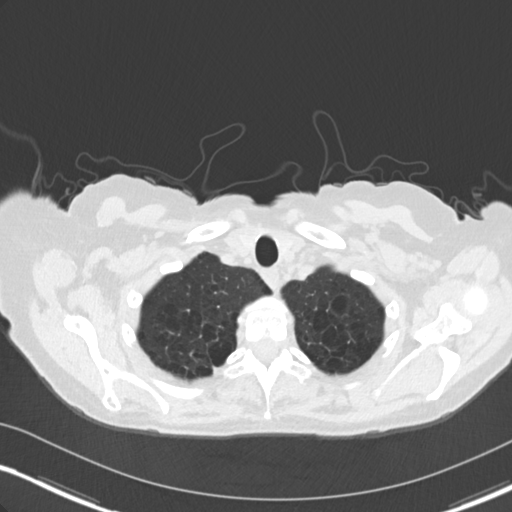
[im 147/154  lung]
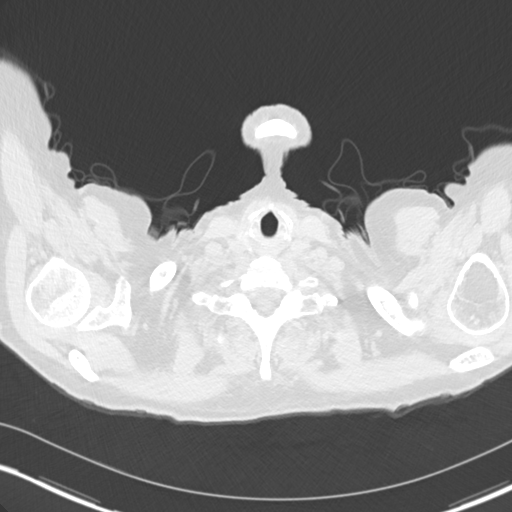

[Series 9: coronal · coronal · 0.64mm/px · 3 of 101 slices shown]
[im 21/101  lung]
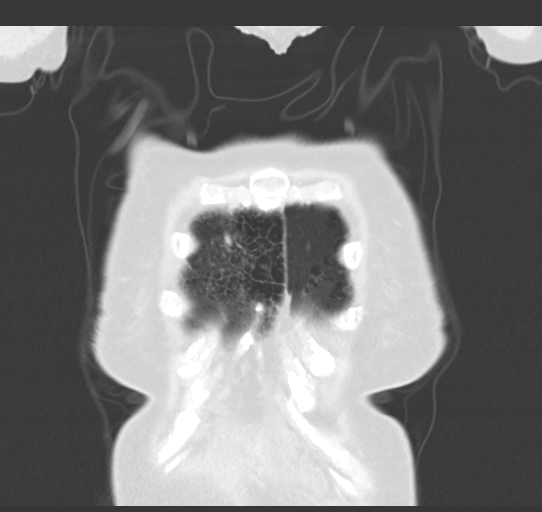
[im 41/101  lung]
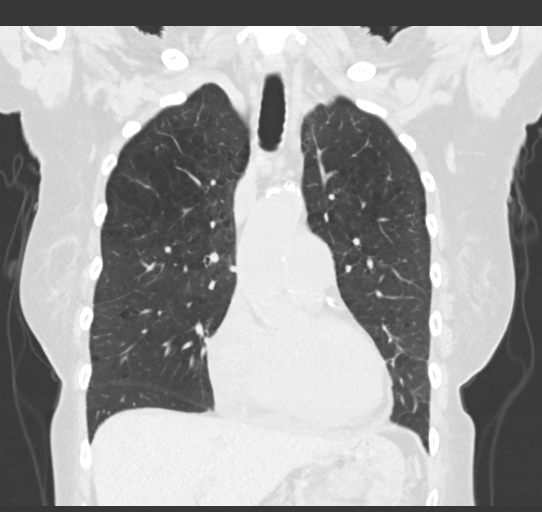
[im 61/101  lung]
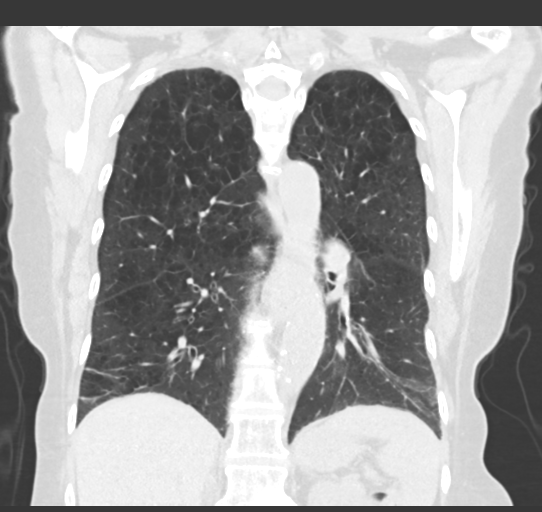

[14 of 36 positions shown; findings below may reference images not displayed]

FINDINGS: Cardiovascular: Heart size is normal. Small amount of pericardial
fluid and/or thickening, increased compared to prior examination. No
associated pericardial calcification. There is aortic
atherosclerosis, as well as atherosclerosis of the great vessels of
the mediastinum and the coronary arteries, including calcified
atherosclerotic plaque in the left main, left anterior descending,
left circumflex and right coronary arteries.

Mediastinum/Nodes: No pathologically enlarged mediastinal or hilar
lymph nodes. Please note that accurate exclusion of hilar adenopathy
is limited on noncontrast CT scans. Esophagus is unremarkable in
appearance. No axillary lymphadenopathy.

Lungs/Pleura: Previously noted pulmonary nodule in the inferior
aspect of the right upper lobe has significantly decreased in size,
currently measuring only 2 mm (image 83 of series 6), compatible
with a benign nodule (presumably infectious). 2 mm right upper lobe
nodule (image 30 of series 6) unchanged in retrospect compared to
the prior examination. No other larger more suspicious appearing
pulmonary nodules or masses are noted. Diffuse bronchial wall
thickening with moderate centrilobular and paraseptal emphysema.
High-resolution images demonstrate no significant regions of
ground-glass attenuation, subpleural reticulation, traction
bronchiectasis or frank honeycombing. Inspiratory and expiratory
imaging is unremarkable. No acute consolidative airspace disease. No
pleural effusions. Linear areas of scarring are noted in the lung
bases bilaterally.

Upper Abdomen: Aortic atherosclerosis.

Musculoskeletal: There are no aggressive appearing lytic or blastic
lesions noted in the visualized portions of the skeleton.
IMPRESSION: 1. Previously noted right upper lobe pulmonary nodule has nearly
completely regressed, compatible with a benign infectious or
inflammatory nodule.
2. No findings to suggest interstitial lung disease.
3. Diffuse bronchial wall thickening with moderate centrilobular and
paraseptal emphysema; imaging findings suggestive of underlying
COPD.
4. Aortic atherosclerosis, in addition to left main and 3 vessel
coronary artery disease. Please note that although the presence of
coronary artery calcium documents the presence of coronary artery
disease, the severity of this disease and any potential stenosis
cannot be assessed on this non-gated CT examination. Assessment for
potential risk factor modification, dietary therapy or pharmacologic
therapy may be warranted, if clinically indicated.
5. Small amount of pericardial fluid and/or thickening, increased
compared to the prior examination. No pericardial calcification.

## 2017-02-08 ENCOUNTER — Other Ambulatory Visit (HOSPITAL_COMMUNITY): Payer: Self-pay | Admitting: Internal Medicine

## 2017-02-08 DIAGNOSIS — Z79899 Other long term (current) drug therapy: Secondary | ICD-10-CM

## 2017-02-08 DIAGNOSIS — Z780 Asymptomatic menopausal state: Secondary | ICD-10-CM

## 2017-02-14 ENCOUNTER — Ambulatory Visit (HOSPITAL_COMMUNITY)
Admission: RE | Admit: 2017-02-14 | Discharge: 2017-02-14 | Disposition: A | Discharge status: Home / Self Care | Payer: Medicare Other | Source: Ambulatory Visit | Attending: Internal Medicine | Admitting: Internal Medicine

## 2017-02-14 DIAGNOSIS — M85851 Other specified disorders of bone density and structure, right thigh: Secondary | ICD-10-CM | POA: Diagnosis not present

## 2017-02-14 DIAGNOSIS — Z79899 Other long term (current) drug therapy: Secondary | ICD-10-CM | POA: Insufficient documentation

## 2017-02-14 DIAGNOSIS — Z780 Asymptomatic menopausal state: Secondary | ICD-10-CM | POA: Diagnosis not present

## 2017-04-20 DIAGNOSIS — I10 Essential (primary) hypertension: Secondary | ICD-10-CM | POA: Diagnosis not present

## 2017-04-20 DIAGNOSIS — R7301 Impaired fasting glucose: Secondary | ICD-10-CM | POA: Diagnosis not present

## 2017-04-25 DIAGNOSIS — K219 Gastro-esophageal reflux disease without esophagitis: Secondary | ICD-10-CM | POA: Diagnosis not present

## 2017-04-25 DIAGNOSIS — R7301 Impaired fasting glucose: Secondary | ICD-10-CM | POA: Diagnosis not present

## 2017-04-25 DIAGNOSIS — Z0000 Encounter for general adult medical examination without abnormal findings: Secondary | ICD-10-CM | POA: Diagnosis not present

## 2017-04-25 DIAGNOSIS — Z720 Tobacco use: Secondary | ICD-10-CM | POA: Diagnosis not present

## 2017-04-25 DIAGNOSIS — Z23 Encounter for immunization: Secondary | ICD-10-CM | POA: Diagnosis not present

## 2017-04-25 DIAGNOSIS — E782 Mixed hyperlipidemia: Secondary | ICD-10-CM | POA: Diagnosis not present

## 2017-05-15 ENCOUNTER — Ambulatory Visit: Payer: Medicare Other | Admitting: Internal Medicine

## 2017-05-31 ENCOUNTER — Ambulatory Visit: Payer: Medicare Other | Admitting: Internal Medicine

## 2017-07-04 DIAGNOSIS — J069 Acute upper respiratory infection, unspecified: Secondary | ICD-10-CM | POA: Diagnosis not present

## 2017-07-13 DIAGNOSIS — J069 Acute upper respiratory infection, unspecified: Secondary | ICD-10-CM | POA: Diagnosis not present

## 2017-09-04 DIAGNOSIS — J441 Chronic obstructive pulmonary disease with (acute) exacerbation: Secondary | ICD-10-CM | POA: Diagnosis not present

## 2017-09-17 ENCOUNTER — Other Ambulatory Visit (HOSPITAL_COMMUNITY): Payer: Self-pay | Admitting: Adult Health Nurse Practitioner

## 2017-09-17 ENCOUNTER — Ambulatory Visit (HOSPITAL_COMMUNITY)
Admission: RE | Admit: 2017-09-17 | Discharge: 2017-09-17 | Disposition: A | Discharge status: Home / Self Care | Payer: Medicare Other | Source: Ambulatory Visit | Attending: Adult Health Nurse Practitioner | Admitting: Adult Health Nurse Practitioner

## 2017-09-17 DIAGNOSIS — I700 Atherosclerosis of aorta: Secondary | ICD-10-CM | POA: Insufficient documentation

## 2017-09-17 DIAGNOSIS — J449 Chronic obstructive pulmonary disease, unspecified: Secondary | ICD-10-CM | POA: Diagnosis not present

## 2017-09-17 DIAGNOSIS — R05 Cough: Secondary | ICD-10-CM

## 2017-09-17 DIAGNOSIS — R059 Cough, unspecified: Secondary | ICD-10-CM

## 2017-10-19 DIAGNOSIS — H2513 Age-related nuclear cataract, bilateral: Secondary | ICD-10-CM | POA: Diagnosis not present

## 2017-10-19 DIAGNOSIS — H5203 Hypermetropia, bilateral: Secondary | ICD-10-CM | POA: Diagnosis not present

## 2017-11-05 DIAGNOSIS — R05 Cough: Secondary | ICD-10-CM | POA: Diagnosis not present

## 2017-11-05 DIAGNOSIS — I739 Peripheral vascular disease, unspecified: Secondary | ICD-10-CM | POA: Diagnosis not present

## 2017-11-05 DIAGNOSIS — R7301 Impaired fasting glucose: Secondary | ICD-10-CM | POA: Diagnosis not present

## 2017-11-05 DIAGNOSIS — J441 Chronic obstructive pulmonary disease with (acute) exacerbation: Secondary | ICD-10-CM | POA: Diagnosis not present

## 2017-11-05 DIAGNOSIS — J069 Acute upper respiratory infection, unspecified: Secondary | ICD-10-CM | POA: Diagnosis not present

## 2017-11-05 DIAGNOSIS — Z6822 Body mass index (BMI) 22.0-22.9, adult: Secondary | ICD-10-CM | POA: Diagnosis not present

## 2017-11-05 DIAGNOSIS — E782 Mixed hyperlipidemia: Secondary | ICD-10-CM | POA: Diagnosis not present

## 2017-11-07 DIAGNOSIS — J449 Chronic obstructive pulmonary disease, unspecified: Secondary | ICD-10-CM | POA: Diagnosis not present

## 2017-11-07 DIAGNOSIS — K219 Gastro-esophageal reflux disease without esophagitis: Secondary | ICD-10-CM | POA: Diagnosis not present

## 2017-11-07 DIAGNOSIS — R7301 Impaired fasting glucose: Secondary | ICD-10-CM | POA: Diagnosis not present

## 2017-11-07 DIAGNOSIS — I739 Peripheral vascular disease, unspecified: Secondary | ICD-10-CM | POA: Diagnosis not present

## 2017-11-07 DIAGNOSIS — E782 Mixed hyperlipidemia: Secondary | ICD-10-CM | POA: Diagnosis not present

## 2017-11-12 ENCOUNTER — Ambulatory Visit (INDEPENDENT_AMBULATORY_CARE_PROVIDER_SITE_OTHER): Payer: Medicare Other | Admitting: Family

## 2017-11-12 ENCOUNTER — Ambulatory Visit (INDEPENDENT_AMBULATORY_CARE_PROVIDER_SITE_OTHER)
Admission: RE | Admit: 2017-11-12 | Discharge: 2017-11-12 | Disposition: A | Discharge status: Home / Self Care | Payer: Medicare Other | Source: Ambulatory Visit | Attending: Family | Admitting: Family

## 2017-11-12 ENCOUNTER — Ambulatory Visit (HOSPITAL_COMMUNITY)
Admission: RE | Admit: 2017-11-12 | Discharge: 2017-11-12 | Disposition: A | Discharge status: Home / Self Care | Payer: Medicare Other | Source: Ambulatory Visit | Attending: Family | Admitting: Family

## 2017-11-12 ENCOUNTER — Encounter: Payer: Self-pay | Admitting: Family

## 2017-11-12 VITALS — BP 93/65 | HR 91 | Temp 97.6°F | Resp 18 | Ht 67.0 in | Wt 137.2 lb

## 2017-11-12 DIAGNOSIS — I739 Peripheral vascular disease, unspecified: Secondary | ICD-10-CM | POA: Diagnosis not present

## 2017-11-12 DIAGNOSIS — I7409 Other arterial embolism and thrombosis of abdominal aorta: Secondary | ICD-10-CM

## 2017-11-12 DIAGNOSIS — Z48812 Encounter for surgical aftercare following surgery on the circulatory system: Secondary | ICD-10-CM | POA: Diagnosis not present

## 2017-11-12 DIAGNOSIS — Z86718 Personal history of other venous thrombosis and embolism: Secondary | ICD-10-CM | POA: Insufficient documentation

## 2017-11-12 DIAGNOSIS — K551 Chronic vascular disorders of intestine: Secondary | ICD-10-CM

## 2017-11-12 DIAGNOSIS — F17200 Nicotine dependence, unspecified, uncomplicated: Secondary | ICD-10-CM | POA: Insufficient documentation

## 2017-11-12 DIAGNOSIS — Z95828 Presence of other vascular implants and grafts: Secondary | ICD-10-CM | POA: Insufficient documentation

## 2017-11-12 NOTE — Progress Notes (Signed)
Pt. Is here for one year follow-up.

## 2017-11-12 NOTE — Patient Instructions (Signed)
Before your next abdominal ultrasound:  Take two Extra-Strength Gas-X capsules at bedtime the night before the test. Take another two Extra-Strength Gas-X capsules 3 hours before the test.  Avoid gas forming foods and beverages the day before the test.       Steps to Quit Smoking Smoking tobacco can be bad for your health. It can also affect almost every organ in your body. Smoking puts you and people around you at risk for many serious long-lasting (chronic) diseases. Quitting smoking is hard, but it is one of the best things that you can do for your health. It is never too late to quit. What are the benefits of quitting smoking? When you quit smoking, you lower your risk for getting serious diseases and conditions. They can include:  Lung cancer or lung disease.  Heart disease.  Stroke.  Heart attack.  Not being able to have children (infertility).  Weak bones (osteoporosis) and broken bones (fractures).  If you have coughing, wheezing, and shortness of breath, those symptoms may get better when you quit. You may also get sick less often. If you are pregnant, quitting smoking can help to lower your chances of having a baby of low birth weight. What can I do to help me quit smoking? Talk with your doctor about what can help you quit smoking. Some things you can do (strategies) include:  Quitting smoking totally, instead of slowly cutting back how much you smoke over a period of time.  Going to in-person counseling. You are more likely to quit if you go to many counseling sessions.  Using resources and support systems, such as: ? Database administrator with a Social worker. ? Phone quitlines. ? Careers information officer. ? Support groups or group counseling. ? Text messaging programs. ? Mobile phone apps or applications.  Taking medicines. Some of these medicines may have nicotine in them. If you are pregnant or breastfeeding, do not take any medicines to quit smoking unless your doctor  says it is okay. Talk with your doctor about counseling or other things that can help you.  Talk with your doctor about using more than one strategy at the same time, such as taking medicines while you are also going to in-person counseling. This can help make quitting easier. What things can I do to make it easier to quit? Quitting smoking might feel very hard at first, but there is a lot that you can do to make it easier. Take these steps:  Talk to your family and friends. Ask them to support and encourage you.  Call phone quitlines, reach out to support groups, or work with a Social worker.  Ask people who smoke to not smoke around you.  Avoid places that make you want (trigger) to smoke, such as: ? Bars. ? Parties. ? Smoke-break areas at work.  Spend time with people who do not smoke.  Lower the stress in your life. Stress can make you want to smoke. Try these things to help your stress: ? Getting regular exercise. ? Deep-breathing exercises. ? Yoga. ? Meditating. ? Doing a body scan. To do this, close your eyes, focus on one area of your body at a time from head to toe, and notice which parts of your body are tense. Try to relax the muscles in those areas.  Download or buy apps on your mobile phone or tablet that can help you stick to your quit plan. There are many free apps, such as QuitGuide from the [employer] Office manager for Disease  Control and Prevention). You can find more support from smokefree.gov and other websites.  This information is not intended to replace advice given to you by your health care provider. Make sure you discuss any questions you have with your health care provider. Document Released: 04/29/2009 Document Revised: 02/29/2016 Document Reviewed: 11/17/2014 Elsevier Interactive Patient Education  2018 [employer].     Peripheral Vascular Disease Peripheral vascular disease (PVD) is a disease of the blood vessels that are not part of your heart and brain. A simple  term for PVD is poor circulation. In most cases, PVD narrows the blood vessels that carry blood from your heart to the rest of your body. This can result in a decreased supply of blood to your arms, legs, and internal organs, like your stomach or kidneys. However, it most often affects a person's lower legs and feet. There are two types of PVD.  Organic PVD. This is the more common type. It is caused by damage to the structure of blood vessels.  Functional PVD. This is caused by conditions that make blood vessels contract and tighten (spasm).  Without treatment, PVD tends to get worse over time. PVD can also lead to acute ischemic limb. This is when an arm or limb suddenly has trouble getting enough blood. This is a medical emergency. Follow these instructions at home:  Take medicines only as told by your doctor.  Do not use any tobacco products, including cigarettes, chewing tobacco, or electronic cigarettes. If you need help quitting, ask your doctor.  Lose weight if you are overweight, and maintain a healthy weight as told by your doctor.  Eat a diet that is low in fat and cholesterol. If you need help, ask your doctor.  Exercise regularly. Ask your doctor for some good activities for you.  Take good care of your feet. ? Wear comfortable shoes that fit well. ? Check your feet often for any cuts or sores. Contact a doctor if:  You have cramps in your legs while walking.  You have leg pain when you are at rest.  You have coldness in a leg or foot.  Your skin changes.  You are unable to get or have an erection (erectile dysfunction).  You have cuts or sores on your feet that are not healing. Get help right away if:  Your arm or leg turns cold and blue.  Your arms or legs become red, warm, swollen, painful, or numb.  You have chest pain or trouble breathing.  You suddenly have weakness in your face, arm, or leg.  You become very confused or you cannot speak.  You  suddenly have a very bad headache.  You suddenly cannot see. This information is not intended to replace advice given to you by your health care provider. Make sure you discuss any questions you have with your health care provider. Document Released: 09/27/2009 Document Revised: 12/09/2015 Document Reviewed: 12/11/2013 Elsevier Interactive Patient Education  2017 [employer]     Chronic Mesenteric Ischemia Mesenteric ischemia is poor blood flow (circulation) in the vessels that supply blood to the stomach, intestines, and liver (mesenteric organs). Chronic mesenteric ischemia, also called mesenteric angina or intestinal angina, is a long-term (chronic) condition. It happens when an artery or vein that provides blood to the mesenteric organs gradually becomes blocked or narrow, restricting the blood supply to the organs. When the blood supply is severely restricted, the mesenteric organs cannot work properly. What are the causes? This condition is commonly caused by  fatty deposits that build up in an artery (plaque), which can narrow the artery and restrict blood flow. Other causes include:  Weakened areas in blood vessel walls (aneurysms).  Conditions that cause twisting or inflammation of blood vessels, such as fibromuscular dysplasia or arteritis.  A disorder in which blood clots form in the veins (venous thrombosis).  Scarring and thickening (fibrosis) of blood vessels caused by radiation therapy.  A tear in the aorta, the body's main artery (aortic dissection).  Blood vessel problems after illegal drug use, such as use of cocaine.  Tumors in the nervous system (neurofibromatosis).  Certain autoimmune diseases, such as lupus.  What increases the risk? The following factors may make you more likely to develop this condition:  Being female.  Being over age 8, especially if you have a history of heart problems.  Smoking.  Congestive heart failure.  Irregular heartbeat  (arrhythmia).  Having a history of heart attack or stroke.  Diabetes.  High cholesterol.  High blood pressure (hypertension).  Being overweight or obese.  Kidney disease (renal disease) requiring dialysis.  What are the signs or symptoms? Symptoms of this condition include:  Abdomen (abdominal) pain or cramps that develop 15-60 minutes after a meal. This pain may last for 1-3 hours. Some people may develop a fear of eating because of this symptom.  Weight loss.  Diarrhea.  Bloody stool.  Nausea.  Vomiting.  Bloating.  Abdominal pain after stress or with exercise.  How is this diagnosed? This condition is diagnosed based on:  Your medical history.  A physical exam.  Tests, such as: ? Ultrasound. ? CT scan. ? Blood tests. ? Urine tests. ? An imaging test that involves injecting a dye into your arteries to show blood flow through blood vessels (angiogram). This can help to show if there are any blockages in the vessels that lead to the intestines. ? Passing a small probe through the mouth and into the stomach to measure the output of carbon dioxide (gastric tonometry). This can help to indicate whether there is decreased blood flow to the stomach and intestines.  How is this treated? This condition may be treated with:  Dietary changes such as eating smaller, low-fat, meals more frequently.  Lifestyle changes to treat underlying conditions that contribute to the disease, such as high cholesterol and high blood pressure.  Medicines to reduce blood clotting and increase blood flow.  Surgery to remove the blockage, repair arteries or veins, and restore blood flow. This may involve: ? Angioplasty. This is surgery to widen the affected artery, reduce the blockage, and sometimes insert a small, mesh tube (stent). ? Bypass surgery. This may be done to go around (bypass) the blockage and reconnect healthy arteries or veins. ? Placing a stent in the affected area. This  may be done to help keep blocked arteries open.  Follow these instructions at home: Eating and drinking  Eat a heart-healthy diet. This includes fresh fruits and vegetables, whole grains, and lean proteins like chicken, fish, eggs, and beans.  Avoid foods that contain a lot of: ? Salt (sodium). ? Sugar. ? Saturated fat (such as red meat). ? Trans fat (such as fried foods).  Stay hydrated. Drink enough fluid to keep your urine clear or pale yellow. Lifestyle  Stay active and get regular exercise as told by your health care provider. Aim for 150 minutes of moderate activity or 75 minutes of vigorous activity a week. Ask your health care provider what activities and forms of  exercise are safe for you.  Maintain a healthy weight.  Work with your health care provider to manage your cholesterol.  Manage any other health problems you have, such as high blood pressure, diabetes, or heart rhythm problems.  Do not use any products that contain nicotine or tobacco, such as cigarettes and e-cigarettes. If you need help quitting, ask your health care provider. General instructions  Take over-the-counter and prescription medicines only as told by your health care provider.  Keep all follow-up visits as told by your health care provider. This is important. Contact a health care provider if:  Your symptoms do not improve or they return after treatment.  You have a fever. Get help right away if:  You have severe abdominal pain.  You have severe chest pain.  You have shortness of breath.  You feel weak or dizzy.  You have palpitations.  You have numbness or weakness in your face, arm, or leg.  You are confused.  You have trouble speaking or people have trouble understanding what you are saying.  You are constipated.  You have trouble urinating.  You have blood in your stool.  You have severe nausea, vomiting, or persistent diarrhea. Summary  Mesenteric ischemia is poor  circulation in the vessels that supply blood to the the stomach, intestines, and liver (mesenteric organs).  This condition happens when an artery or vein that provides blood to the mesenteric organs gradually becomes blocked or narrow, restricting the blood supply to the organs.  This condition is commonly caused by fatty deposits that build up in an artery (plaque), which can narrow the artery and restrict blood flow.  You are more likely to develop this condition if you are over age 48 and have a history of heart problems, high blood pressure, diabetes, or high cholesterol.  This condition is usually treated with medicines, dietary and lifestyle changes, and surgery to remove the blockage, repair arteries or veins, and restore blood flow. This information is not intended to replace advice given to you by your health care provider. Make sure you discuss any questions you have with your health care provider. Document Released: 02/20/2011 Document Revised: 06/17/2016 Document Reviewed: 06/17/2016 Elsevier Interactive Patient Education  2017 [employer].

## 2017-11-12 NOTE — Progress Notes (Signed)
[facility] [place]   CC: Follow up peripheral artery occlusive disease  History of Present Illness Kayla Sawyer is a 72 y.o. female who is s/p aortobifemoral bypass graft using a 14 x 7 bifurcated graft on 06/14/2016 by Dr. Trula Slade. Dr. Trula Slade also reimplanted the inferior mesenteric artery and did an aorta to superior mesenteric artery bypass graft using 7 mm dacryon.  Her operation was done for bilateral claudication. 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  Her weight today is 137#, increased from 129# in a year. She denies post prandial abdominal pain. She is walking long distances with no claudication symptoms.   She denies any known history of stroke or TIA.   The patient has a 50+-pack-year history of smoking and has COPD. She is followed by a pulmonologist and is also suspected to have scleroderma which is followed by Dr. Trudie Reed. She had a cardiac catheterization which showed mild obstructive disease and no pulmonary hypertension. She suffers from hypercholesterolemia which is managed with a statin. She is on a baby aspirin for antiplatelet therapy.  Dr. Trula Slade evaluated pt on 08-07-16. At that time her weight was 130#. Her femoral pulses were palpable, midline incision was well-healed without hernia, there was a small left groin seroma. She was back to almost normal activity. Her only postoperative issue was a small seroma in the left groin which we will continue to evaluate. Dr. Trula Slade did not think that she will need any kind of intervention as she states that this has gotten smaller since surgery.    Pt Diabetic: No Pt smoker: smoker  (1/4 ppd x 50 yrs)  Pt meds include: Statin :Yes Betablocker: No ASA: Yes Other anticoagulants/antiplatelets: no    Past Medical History:  Diagnosis Date  . Arthritis   . COPD (chronic obstructive pulmonary disease) ([place])   . Depression   . E. coli pyelonephritis 6//2014   . GERD (gastroesophageal reflux disease)   . Pneumonia   . Pulmonary hypertension ([place])   . Sepsis [facility])     Social History Social History   Tobacco Use  . Smoking status: Current Every Day Smoker    Packs/day: 0.25    Years: 50.00    Pack years: 12.50    Types: Cigarettes  . Smokeless tobacco: Never Used  . Tobacco comment: 1/2-3/4 ppd 1.29.18  Substance Use Topics  . Alcohol use: No    Alcohol/week: 0.0 oz  . Drug use: No    Family History Family History  Problem Relation Age of Onset  . Diabetes Mother   . Emphysema Father     Past Surgical History:  Procedure Laterality Date  . ABDOMINAL HYSTERECTOMY     partial  . AORTA - BILATERAL FEMORAL ARTERY BYPASS GRAFT Bilateral 06/14/2016   Procedure: AORTOBIFEMORAL BYPASS GRAFT AORTA SMA BYPASS GRAFT RE-IMPLANTATION  OF SUPERIOR MESENTERIC ARTERY;  Surgeon: Serafina Mitchell, MD;  Location: [facility];  Service: Vascular;  Laterality: Bilateral;  . BACK SURGERY     L4/5  . CARDIAC CATHETERIZATION N/A 01/03/2016   Procedure: Right/Left Heart Cath and Coronary Angiography;  Surgeon: Jolaine Artist, MD;  Location: [place] CV [facility];  Service: Cardiovascular;  Laterality: N/A;  . COLONOSCOPY N/A 08/05/2015   Procedure: COLONOSCOPY;  Surgeon: Rogene Houston, MD;  [facility];  Service: Endoscopy;  Laterality: N/A;  730  . ELBOW SURGERY    . PERIPHERAL VASCULAR CATHETERIZATION N/A 02/16/2016   Procedure: Abdominal Aortogram w/Lower  Extremity;  Surgeon: Wellington Hampshire, MD;  Location: [place] CV LAB;  Service: Cardiovascular;  Laterality: N/A;    Allergies  Allergen Reactions  . Sulfur Nausea Only    Current Outpatient Medications  Medication Sig Dispense Refill  . acetaminophen (TYLENOL) 500 MG tablet Take 500 mg by mouth every 6 (six) hours as needed for mild pain.    Marland Kitchen aspirin EC 81 MG tablet Take 1 tablet (81 mg total) by mouth daily.    Marland Kitchen atorvastatin (LIPITOR) 20 MG tablet TAKE 1 TABLET BY MOUTH   DAILY 90 tablet 3  . cholecalciferol (VITAMIN D) 1000 units tablet Take 1,000 Units by mouth daily.    Marland Kitchen Fexofenadine HCl (ALLEGRA ALLERGY PO) Take by mouth.    . Multiple Vitamin (MULTIVITAMIN WITH MINERALS) TABS tablet Take 1 tablet by mouth daily.    Marland Kitchen omeprazole (PRILOSEC) 20 MG capsule Take 20 mg by mouth daily.     . sertraline (ZOLOFT) 50 MG tablet Take 50 mg by mouth daily.    . Tiotropium Bromide Monohydrate (SPIRIVA RESPIMAT) 2.5 MCG/ACT AERS Inhale 2 puffs into the lungs daily. 3 Inhaler 4  . levocetirizine (XYZAL) 5 MG tablet     . oxyCODONE-acetaminophen (PERCOCET) 5-325 MG tablet Take 1-2 tablets by mouth every 4 (four) hours as needed. (Patient not taking: Reported on 11/12/2017) 20 tablet 0   No current facility-administered medications for this visit.     ROS: See HPI for pertinent positives and negatives.   Physical Examination  Vitals:   11/12/17 1013 11/12/17 1029  BP: 99/65 93/65  Pulse: 91   Resp: 18   Temp: 97.6 F (36.4 C)   TempSrc: Oral   SpO2: 94%   Weight: 137 lb 3.2 oz (62.2 kg)   Height: 5\' 7"  (1.702 m)    Body mass index is 21.49 kg/m.    General: A&O x 3, WDWN, thin female. Odor of cigarette smoke on her person. Gait: normal HENT: Excessive facial and neck wrinkling, otherwise no gross abnormalities.  Eyes: PERRLA. Pulmonary: Respirations are non labored, CTAB, slightly limitied air movement in all fields Cardiac: regular rhythm, no detected murmur.        Carotid Bruits Right Left   Negative Negative   Radial pulses: right is 1+ palpable, right brachial is 3+ palpable, left radial is 2+ palpable.        Adominal aortic pulse is faintly palpable                         VASCULAR EXAM: Extremities without ischemic changes, without Gangrene; without open wounds. There is no longer a seroma in the left groin.                                                                                                            LE Pulses Right Left        FEMORAL  2+ palpable  2+ palpable        POPLITEAL  not palpable   not palpable  POSTERIOR TIBIAL  1+ palpable    palpable        DORSALIS PEDIS      ANTERIOR TIBIAL not palpable  not palpable    Abdomen: soft, NT, no palpable masses. Skin: no rashes, no cellulitis, no ulcers noted. Musculoskeletal: no muscle wasting or atrophy. Arthritic changes in her hands.   Neurologic: A&O X 3; appropriate affect, Sensation is normal; MOTOR FUNCTION:  moving all extremities equally, motor strength 5/5 throughout. Speech is fluent/normal. CN 2-12 intact. Psychiatric: Thought content is normal, mood appropriate for clinical situation.    ASSESSMENT: Kayla Sawyer is a 72 y.o. female who is s/p aortobifemoral bypass graft using a 14 x 7 bifurcated graft, reimplantation of the inferior mesenteric artery, and an aorta to superior mesenteric artery bypass graft using 7 mm dacryon.  She has no post prandial abdominal pain and her weight has increased 8 pounds in a year.  She is walking long distances with no claudication.   Fortunately she does not have DM has no known CAD, and her BMI is low normal, but unfortunately she continues to smoke; see Plan.  DATA  Mesenteric Artery Duplex (11/12/17): SMA bypass graft with increased velocity in the proximal segment (287 cm/s). There appears to be plaque in the proximal to mid segment of the SMA, limited visualization.  No significant change compared to the exam on 11-07-16.    Aortobifem bypass graft duplex (11/12/17): Decreased visualization of the abdominal vasculature due to overlying bowel gas.  No evidence of restenosis or the aorto-bifemoral bypass graft. All biphasic and triphasic waveforms. Seems to be less stenosis in the right EIA outflow compared to the exam on 11-07-16.    ABI (Date: 11/12/2017):  R:   ABI: 1.02 (was 1.08 on 11-07-16),   PT: bi  DP: bi  TBI:  0.71 (was 0.62)  L:   ABI: 1.15 (was 1.12),   PT:  bi  DP: bi  TBI: 0.79 (was 0.72)  Stable and normal bilateral ABI with all biphasic waveforms. Improved bilateral TBI, both normal.    Carotid Duplex (12-31-15): Findings suggest 1-39% internal carotid artery stenosis bilaterally. Vertebral arteries are patent with antegrade flow.      PLAN:  The patient was counseled re smoking cessation and given several free resources re smoking cessation.  Based on the patient's vascular studies and examination, pt will return to clinic in 1 year with aortoiliac duplex, mesenteric artery duplex, and ABI's.   I advised pt to notify us if she develops concerns re the circulation in her feet or legs, and if she develops post prandial abdominal pain.   I discussed in depth with the patient the nature of atherosclerosis, and emphasized the importance of maximal medical management including strict control of blood pressure, blood glucose, and lipid levels, obtaining regular exercise, and cessation of smoking.  The patient is aware that without maximal medical management the underlying atherosclerotic disease process will progress, limiting the benefit of any interventions.  The patient was given information about PAD including signs, symptoms, treatment, what symptoms should prompt the patient to seek immediate medical care, and risk reduction measures to take.  Clemon Chambers, RN, MSN, FNP-C [facility] Phone: [phone]  Clinic MD: Trula Slade  11/12/17 10:53 AM

## 2017-11-29 ENCOUNTER — Ambulatory Visit (HOSPITAL_COMMUNITY)
Admission: RE | Admit: 2017-11-29 | Discharge: 2017-11-29 | Disposition: A | Discharge status: Home / Self Care | Payer: Medicare Other | Source: Ambulatory Visit | Attending: Nurse Practitioner | Admitting: Nurse Practitioner

## 2017-11-29 ENCOUNTER — Other Ambulatory Visit (HOSPITAL_COMMUNITY): Payer: Self-pay | Admitting: Nurse Practitioner

## 2017-11-29 DIAGNOSIS — M79672 Pain in left foot: Secondary | ICD-10-CM | POA: Diagnosis not present

## 2017-11-29 DIAGNOSIS — J069 Acute upper respiratory infection, unspecified: Secondary | ICD-10-CM | POA: Diagnosis not present

## 2017-11-29 DIAGNOSIS — S99922A Unspecified injury of left foot, initial encounter: Secondary | ICD-10-CM | POA: Diagnosis not present

## 2018-01-08 ENCOUNTER — Other Ambulatory Visit: Payer: Self-pay | Admitting: Cardiovascular Disease

## 2018-01-08 DIAGNOSIS — I739 Peripheral vascular disease, unspecified: Secondary | ICD-10-CM

## 2018-01-08 NOTE — Telephone Encounter (Signed)
Please review for refill, Thanks !  

## 2018-05-01 DIAGNOSIS — E782 Mixed hyperlipidemia: Secondary | ICD-10-CM | POA: Diagnosis not present

## 2018-05-01 DIAGNOSIS — R7301 Impaired fasting glucose: Secondary | ICD-10-CM | POA: Diagnosis not present

## 2018-05-03 ENCOUNTER — Other Ambulatory Visit: Payer: Self-pay

## 2018-05-03 DIAGNOSIS — Z0000 Encounter for general adult medical examination without abnormal findings: Secondary | ICD-10-CM | POA: Diagnosis not present

## 2018-05-03 DIAGNOSIS — N183 Chronic kidney disease, stage 3 (moderate): Secondary | ICD-10-CM | POA: Diagnosis not present

## 2018-05-03 DIAGNOSIS — R7301 Impaired fasting glucose: Secondary | ICD-10-CM | POA: Diagnosis not present

## 2018-05-03 NOTE — Patient Outreach (Signed)
[facility]) Care Management  05/03/2018  LEANOR VORIS [birth year] [id number]   Medication Adherence call to Mrs. Emmie Niemann left a message for patient to call back patient is due on Atorvastatin 20 mg. Mrs. Fluhr is showing past due under [place].   [facility] Management Direct Dial [phone]  Fax [phone] [email or web address]

## 2018-05-31 DIAGNOSIS — Z23 Encounter for immunization: Secondary | ICD-10-CM | POA: Diagnosis not present

## 2018-08-14 DIAGNOSIS — M25551 Pain in right hip: Secondary | ICD-10-CM | POA: Diagnosis not present

## 2018-08-14 DIAGNOSIS — M7061 Trochanteric bursitis, right hip: Secondary | ICD-10-CM | POA: Diagnosis not present

## 2018-08-14 DIAGNOSIS — M7062 Trochanteric bursitis, left hip: Secondary | ICD-10-CM | POA: Diagnosis not present

## 2018-11-04 DIAGNOSIS — N183 Chronic kidney disease, stage 3 (moderate): Secondary | ICD-10-CM | POA: Diagnosis not present

## 2018-11-04 DIAGNOSIS — E782 Mixed hyperlipidemia: Secondary | ICD-10-CM | POA: Diagnosis not present

## 2018-11-04 DIAGNOSIS — R7301 Impaired fasting glucose: Secondary | ICD-10-CM | POA: Diagnosis not present

## 2018-11-08 DIAGNOSIS — I739 Peripheral vascular disease, unspecified: Secondary | ICD-10-CM | POA: Diagnosis not present

## 2018-11-08 DIAGNOSIS — R7303 Prediabetes: Secondary | ICD-10-CM | POA: Diagnosis not present

## 2018-11-08 DIAGNOSIS — K219 Gastro-esophageal reflux disease without esophagitis: Secondary | ICD-10-CM | POA: Diagnosis not present

## 2018-11-08 DIAGNOSIS — N183 Chronic kidney disease, stage 3 (moderate): Secondary | ICD-10-CM | POA: Diagnosis not present

## 2018-11-08 DIAGNOSIS — E782 Mixed hyperlipidemia: Secondary | ICD-10-CM | POA: Diagnosis not present

## 2018-11-25 DIAGNOSIS — Z0000 Encounter for general adult medical examination without abnormal findings: Secondary | ICD-10-CM | POA: Diagnosis not present

## 2019-03-25 ENCOUNTER — Other Ambulatory Visit (HOSPITAL_COMMUNITY): Payer: Self-pay | Admitting: Internal Medicine

## 2019-03-25 DIAGNOSIS — Z1231 Encounter for screening mammogram for malignant neoplasm of breast: Secondary | ICD-10-CM

## 2019-04-03 ENCOUNTER — Ambulatory Visit (HOSPITAL_COMMUNITY)
Admission: RE | Admit: 2019-04-03 | Discharge: 2019-04-03 | Disposition: A | Discharge status: Home / Self Care | Payer: Medicare Other | Source: Ambulatory Visit | Attending: Internal Medicine | Admitting: Internal Medicine

## 2019-04-03 ENCOUNTER — Other Ambulatory Visit: Payer: Self-pay

## 2019-04-03 DIAGNOSIS — Z1231 Encounter for screening mammogram for malignant neoplasm of breast: Secondary | ICD-10-CM | POA: Insufficient documentation

## 2019-04-28 DIAGNOSIS — E782 Mixed hyperlipidemia: Secondary | ICD-10-CM | POA: Diagnosis not present

## 2019-04-28 DIAGNOSIS — N183 Chronic kidney disease, stage 3 unspecified: Secondary | ICD-10-CM | POA: Diagnosis not present

## 2019-04-28 DIAGNOSIS — R7303 Prediabetes: Secondary | ICD-10-CM | POA: Diagnosis not present

## 2019-04-28 DIAGNOSIS — R7301 Impaired fasting glucose: Secondary | ICD-10-CM | POA: Diagnosis not present

## 2019-04-30 DIAGNOSIS — J441 Chronic obstructive pulmonary disease with (acute) exacerbation: Secondary | ICD-10-CM | POA: Diagnosis not present

## 2019-04-30 DIAGNOSIS — N1831 Chronic kidney disease, stage 3a: Secondary | ICD-10-CM | POA: Diagnosis not present

## 2019-04-30 DIAGNOSIS — K219 Gastro-esophageal reflux disease without esophagitis: Secondary | ICD-10-CM | POA: Diagnosis not present

## 2019-04-30 DIAGNOSIS — E782 Mixed hyperlipidemia: Secondary | ICD-10-CM | POA: Diagnosis not present

## 2019-04-30 DIAGNOSIS — I739 Peripheral vascular disease, unspecified: Secondary | ICD-10-CM | POA: Diagnosis not present

## 2019-06-17 DIAGNOSIS — I739 Peripheral vascular disease, unspecified: Secondary | ICD-10-CM | POA: Diagnosis not present

## 2019-06-17 DIAGNOSIS — J441 Chronic obstructive pulmonary disease with (acute) exacerbation: Secondary | ICD-10-CM | POA: Diagnosis not present

## 2019-06-17 DIAGNOSIS — S99922A Unspecified injury of left foot, initial encounter: Secondary | ICD-10-CM | POA: Diagnosis not present

## 2019-06-17 DIAGNOSIS — R7301 Impaired fasting glucose: Secondary | ICD-10-CM | POA: Diagnosis not present

## 2019-06-19 DIAGNOSIS — N390 Urinary tract infection, site not specified: Secondary | ICD-10-CM | POA: Diagnosis not present

## 2019-06-19 DIAGNOSIS — M545 Low back pain: Secondary | ICD-10-CM | POA: Diagnosis not present

## 2019-08-15 DIAGNOSIS — J449 Chronic obstructive pulmonary disease, unspecified: Secondary | ICD-10-CM | POA: Diagnosis not present

## 2019-08-15 DIAGNOSIS — J9611 Chronic respiratory failure with hypoxia: Secondary | ICD-10-CM | POA: Diagnosis not present

## 2019-08-17 DIAGNOSIS — J449 Chronic obstructive pulmonary disease, unspecified: Secondary | ICD-10-CM | POA: Diagnosis not present

## 2019-09-07 DIAGNOSIS — J441 Chronic obstructive pulmonary disease with (acute) exacerbation: Secondary | ICD-10-CM | POA: Diagnosis not present

## 2019-09-07 DIAGNOSIS — E782 Mixed hyperlipidemia: Secondary | ICD-10-CM | POA: Diagnosis not present

## 2019-09-07 DIAGNOSIS — J449 Chronic obstructive pulmonary disease, unspecified: Secondary | ICD-10-CM | POA: Diagnosis not present

## 2019-09-07 DIAGNOSIS — K219 Gastro-esophageal reflux disease without esophagitis: Secondary | ICD-10-CM | POA: Diagnosis not present

## 2019-09-07 DIAGNOSIS — R7301 Impaired fasting glucose: Secondary | ICD-10-CM | POA: Diagnosis not present

## 2019-09-14 DIAGNOSIS — J449 Chronic obstructive pulmonary disease, unspecified: Secondary | ICD-10-CM | POA: Diagnosis not present

## 2019-10-15 DIAGNOSIS — J449 Chronic obstructive pulmonary disease, unspecified: Secondary | ICD-10-CM | POA: Diagnosis not present

## 2019-10-31 DIAGNOSIS — E559 Vitamin D deficiency, unspecified: Secondary | ICD-10-CM | POA: Diagnosis not present

## 2019-10-31 DIAGNOSIS — F17219 Nicotine dependence, cigarettes, with unspecified nicotine-induced disorders: Secondary | ICD-10-CM | POA: Diagnosis not present

## 2019-10-31 DIAGNOSIS — R7301 Impaired fasting glucose: Secondary | ICD-10-CM | POA: Diagnosis not present

## 2019-10-31 DIAGNOSIS — E782 Mixed hyperlipidemia: Secondary | ICD-10-CM | POA: Diagnosis not present

## 2019-10-31 DIAGNOSIS — F17210 Nicotine dependence, cigarettes, uncomplicated: Secondary | ICD-10-CM | POA: Diagnosis not present

## 2019-10-31 DIAGNOSIS — F17218 Nicotine dependence, cigarettes, with other nicotine-induced disorders: Secondary | ICD-10-CM | POA: Diagnosis not present

## 2019-11-03 DIAGNOSIS — J441 Chronic obstructive pulmonary disease with (acute) exacerbation: Secondary | ICD-10-CM | POA: Diagnosis not present

## 2019-11-03 DIAGNOSIS — J449 Chronic obstructive pulmonary disease, unspecified: Secondary | ICD-10-CM | POA: Diagnosis not present

## 2019-11-03 DIAGNOSIS — K219 Gastro-esophageal reflux disease without esophagitis: Secondary | ICD-10-CM | POA: Diagnosis not present

## 2019-11-03 DIAGNOSIS — E782 Mixed hyperlipidemia: Secondary | ICD-10-CM | POA: Diagnosis not present

## 2019-11-05 DIAGNOSIS — I739 Peripheral vascular disease, unspecified: Secondary | ICD-10-CM | POA: Diagnosis not present

## 2019-11-05 DIAGNOSIS — E782 Mixed hyperlipidemia: Secondary | ICD-10-CM | POA: Diagnosis not present

## 2019-11-05 DIAGNOSIS — J441 Chronic obstructive pulmonary disease with (acute) exacerbation: Secondary | ICD-10-CM | POA: Diagnosis not present

## 2019-11-05 DIAGNOSIS — K219 Gastro-esophageal reflux disease without esophagitis: Secondary | ICD-10-CM | POA: Diagnosis not present

## 2019-11-05 DIAGNOSIS — N1831 Chronic kidney disease, stage 3a: Secondary | ICD-10-CM | POA: Diagnosis not present

## 2019-11-14 DIAGNOSIS — J449 Chronic obstructive pulmonary disease, unspecified: Secondary | ICD-10-CM | POA: Diagnosis not present

## 2019-12-09 ENCOUNTER — Ambulatory Visit: Payer: Medicare Other | Admitting: Pulmonary Disease

## 2019-12-09 ENCOUNTER — Other Ambulatory Visit: Payer: Self-pay

## 2019-12-09 ENCOUNTER — Encounter: Payer: Self-pay | Admitting: Pulmonary Disease

## 2019-12-09 VITALS — BP 108/70 | HR 95 | Temp 96.4°F | Ht 68.0 in | Wt 149.0 lb

## 2019-12-09 DIAGNOSIS — J9611 Chronic respiratory failure with hypoxia: Secondary | ICD-10-CM

## 2019-12-09 DIAGNOSIS — J849 Interstitial pulmonary disease, unspecified: Secondary | ICD-10-CM | POA: Diagnosis not present

## 2019-12-09 DIAGNOSIS — J432 Centrilobular emphysema: Secondary | ICD-10-CM

## 2019-12-09 DIAGNOSIS — F17210 Nicotine dependence, cigarettes, uncomplicated: Secondary | ICD-10-CM

## 2019-12-09 DIAGNOSIS — J449 Chronic obstructive pulmonary disease, unspecified: Secondary | ICD-10-CM | POA: Diagnosis not present

## 2019-12-09 MED ORDER — ALBUTEROL SULFATE (2.5 MG/3ML) 0.083% IN NEBU: 2.5000 mg | INHALATION_SOLUTION | Freq: Four times a day (QID) | RESPIRATORY_TRACT | 12 refills | Status: DC | PRN

## 2019-12-09 MED ORDER — PREDNISONE 10 MG PO TABS: ORAL_TABLET | ORAL | 0 refills | Status: DC

## 2019-12-09 NOTE — Assessment & Plan Note (Signed)
Emphasized smoking cessation, she is not willing to try Chantix again.  Will trial nicotine patches

## 2019-12-09 NOTE — Addendum Note (Signed)
Addended by: Lia Foyer R on: 12/09/2019 02:50 PM   Modules accepted: Orders

## 2019-12-09 NOTE — Addendum Note (Signed)
Addended by: Lia Foyer R on: 12/09/2019 03:03 PM   Modules accepted: Orders

## 2019-12-09 NOTE — Progress Notes (Signed)
Subjective:    Patient ID: Kayla Sawyer, female    DOB: 22-Nov-1945, 74 y.o.   MRN: [id number]  HPI  74 year old smoker presents to establish care for COPD and chronic hypoxic respiratory failure  Chief Complaint  Patient presents with  . Consult    Patient is on 3 liters all the time and sleeps with it. Patient has shortness of breath with exertion. She is fine when sitting and resting. Daughter says that she has no energy and has been on oxygen since January. Patient has productive cough with clear sputum.    She is accompanied by her daughter Kayla Sawyer who is a Marine scientist at [employer], ED  She has seen vascular and cardiology in the past, their consultation was reviewed. She has been maintained on 3 L since January when she was noted by her daughter and PCP to be hypoxic .  She has been on Trelegy for more than 1 year, Spiriva did not help in the past.  She reports worsening dyspnea over the past 6 months to the point now where she has dyspnea on routine activities such as taking a bath, class III-IV.  She denies pedal edema, orthopnea or proximal nocturnal dyspnea or chest pain.  She feels like she has no energy all the time.  She reports intermittent wheezing and cough productive of minimal white sputum especially in the mornings  Last note from Dr Chase Caller 07/2016 reviewed - patulous esophagus noted on prior CT >> diagnosis of scleroderma entertained, saw dr Trudie Reed  Echo 06/2016 nml LV fn, gr 1 DD  Reviewed PCP notes- dr Nevada Crane , basic blood work reviewed within normal limits including HbA1c and vitamin D levels She continues to smoke about half pack per day.  She agrees to being sedentary during the pandemic year.  She has now received both her Covid vaccines  She did not desaturate on walking on 3 L pulse oxygen but her heart rate went up from 86-1 37  Significant tests/ events reviewed  PFTs 11/2015 showed ratio 52 with FEV1 53%, FVC 77%, paradoxical drop with bronhodilator, TLC 99% &  DLCO 29%  HRCT 07/2016 RUL nodule regressed, no ILD, moderate centrilobular and paraseptal emphysema  Past Medical History:  Diagnosis Date  . Arthritis   . COPD (chronic obstructive pulmonary disease) ([place])   . Depression   . E. coli pyelonephritis 6//2014  . GERD (gastroesophageal reflux disease)   . Pneumonia   . Pulmonary hypertension ([place])   . Sepsis [facility])      Past Surgical History:  Procedure Laterality Date  . ABDOMINAL HYSTERECTOMY     partial  . AORTA - BILATERAL FEMORAL ARTERY BYPASS GRAFT Bilateral 06/14/2016   Procedure: AORTOBIFEMORAL BYPASS GRAFT AORTA SMA BYPASS GRAFT RE-IMPLANTATION  OF SUPERIOR MESENTERIC ARTERY;  Surgeon: Serafina Mitchell, MD;  Location: [place];  Service: Vascular;  Laterality: Bilateral;  . BACK SURGERY     L4/5  . CARDIAC CATHETERIZATION N/A 01/03/2016   Procedure: Right/Left Heart Cath and Coronary Angiography;  Surgeon: Jolaine Artist, MD;  Location: [place] CV LAB;  Service: Cardiovascular;  Laterality: N/A;  . COLONOSCOPY N/A 08/05/2015   Procedure: COLONOSCOPY;  Surgeon: Rogene Houston, MD;  [facility];  Service: Endoscopy;  Laterality: N/A;  730  . ELBOW SURGERY    . PERIPHERAL VASCULAR CATHETERIZATION N/A 02/16/2016   Procedure: Abdominal Aortogram w/Lower Extremity;  Surgeon: Wellington Hampshire, MD;  Location: [place] CV LAB;  Service: Cardiovascular;  Laterality:  N/A;    Allergies  Allergen Reactions  . Sulfur Nausea Only    Social History   Socioeconomic History  . Marital status: Married    Spouse name: Not on file  . Number of children: Not on file  . Years of education: Not on file  . Highest education level: Not on file  Occupational History  . Not on file  Tobacco Use  . Smoking status: Current Every Day Smoker    Packs/day: 0.50    Years: 50.00    Pack years: 25.00    Types: Cigarettes  . Smokeless tobacco: Never Used  . Tobacco comment: 1/2-3/4 ppd 1.29.18  Substance and Sexual Activity   . Alcohol use: No    Alcohol/week: 0.0 standard drinks  . Drug use: No  . Sexual activity: Never  Other Topics Concern  . Not on file  Social History Narrative  . Not on file   Social Determinants of Health   Financial Resource Strain:   . Difficulty of Paying Living Expenses:   Food Insecurity:   . Worried About Charity fundraiser in the Last Year:   . Arboriculturist in the Last Year:   Transportation Needs:   . Film/video editor (Medical):   Marland Kitchen Lack of Transportation (Non-Medical):   Physical Activity:   . Days of Exercise per Week:   . Minutes of Exercise per Session:   Stress:   . Feeling of Stress :   Social Connections:   . Frequency of Communication with Friends and Family:   . Frequency of Social Gatherings with Friends and Family:   . Attends Religious Services:   . Active Member of Clubs or Organizations:   . Attends Archivist Meetings:   Marland Kitchen Marital Status:   Intimate Partner Violence:   . Fear of Current or Ex-Partner:   . Emotionally Abused:   Marland Kitchen Physically Abused:   . Sexually Abused:      Family History  Problem Relation Age of Onset  . Diabetes Mother   . Emphysema Father      Review of Systems Constitutional: negative for anorexia, fevers and sweats positive generalized weakness Eyes: negative for irritation, redness and visual disturbance  Ears, nose, mouth, throat, and face: negative for earaches, epistaxis, nasal congestion and sore throat + hard of hearing Cardiovascular: negative for chest pain, lower extremity edema, orthopnea, palpitations and syncope  Gastrointestinal: negative for abdominal pain, constipation, diarrhea, melena, nausea and vomiting  Genitourinary:negative for dysuria, frequency and hematuria  Hematologic/lymphatic: negative for bleeding, easy bruising and lymphadenopathy  Musculoskeletal:negative for arthralgias, muscle weakness and stiff joints  Neurological: negative for coordination problems, gait  problems, headaches  Endocrine: negative for diabetic symptoms including polydipsia, polyuria and weight loss     Objective:   Physical Exam  Gen. Pleasant, well-nourished, in no distress, anxious affect ENT - no pallor,icterus, no post nasal drip Neck: No JVD, no thyromegaly, no carotid bruits Lungs: no use of accessory muscles, no dullness to percussion, decreased without rales or rhonchi  Cardiovascular: Rhythm regular, heart sounds  normal, no murmurs or gallops, no peripheral edema Abdomen: soft and non-tender, no hepatosplenomegaly, BS normal. Musculoskeletal: No deformities, no cyanosis or clubbing Neuro:  alert, non focal       Assessment & Plan:

## 2019-12-09 NOTE — Patient Instructions (Signed)
  Not sure what is causing your worsening  Shortness of breath  Ambulatory saturation on 3 L oxygen  Prednisone 10 mg tabs  Take 2 tabs daily with food x 5ds, then 1 tab daily with food x 5ds then STOP  Prescription for nebulizer with albuterol nebs every 6 hours as needed  CT angiogram to rule out pulmonary embolism  If these tests are negative, then await cardiac and vascular evaluations

## 2019-12-09 NOTE — Assessment & Plan Note (Signed)
Not sure what is causing her worsening  Shortness of breath -she does not desaturate significantly on 3 L oxygen but her heart rate did rise significantly suggesting an element of deconditioning We will treat for COPD exacerbation although no trigger apparent, does not seem to be infective  Prednisone 10 mg tabs  Take 2 tabs daily with food x 5ds, then 1 tab daily with food x 5ds then STOP  Prescription for nebulizer with albuterol nebs every 6 hours as needed

## 2019-12-09 NOTE — Assessment & Plan Note (Signed)
CT angiogram to rule out pulmonary embolism  If  test is negative, then await cardiac and vascular evaluations  If all evaluation negative then will enroll in pulmonary rehab program

## 2019-12-10 MED ORDER — PREDNISONE 10 MG PO TABS: ORAL_TABLET | ORAL | 0 refills | Status: DC

## 2019-12-10 NOTE — Addendum Note (Signed)
Addended by: Lia Foyer R on: 12/10/2019 04:05 PM   Modules accepted: Orders

## 2019-12-15 DIAGNOSIS — J449 Chronic obstructive pulmonary disease, unspecified: Secondary | ICD-10-CM | POA: Diagnosis not present

## 2019-12-17 DIAGNOSIS — E782 Mixed hyperlipidemia: Secondary | ICD-10-CM | POA: Diagnosis not present

## 2019-12-17 DIAGNOSIS — J441 Chronic obstructive pulmonary disease with (acute) exacerbation: Secondary | ICD-10-CM | POA: Diagnosis not present

## 2019-12-17 DIAGNOSIS — Z720 Tobacco use: Secondary | ICD-10-CM | POA: Diagnosis not present

## 2019-12-17 DIAGNOSIS — K219 Gastro-esophageal reflux disease without esophagitis: Secondary | ICD-10-CM | POA: Diagnosis not present

## 2019-12-18 ENCOUNTER — Other Ambulatory Visit: Payer: Self-pay

## 2019-12-18 ENCOUNTER — Telehealth: Payer: Self-pay | Admitting: Pulmonary Disease

## 2019-12-18 ENCOUNTER — Ambulatory Visit (HOSPITAL_COMMUNITY)
Admission: RE | Admit: 2019-12-18 | Discharge: 2019-12-18 | Disposition: A | Discharge status: Home / Self Care | Payer: Medicare Other | Source: Ambulatory Visit | Attending: Pulmonary Disease | Admitting: Pulmonary Disease

## 2019-12-18 DIAGNOSIS — J432 Centrilobular emphysema: Secondary | ICD-10-CM | POA: Diagnosis not present

## 2019-12-18 DIAGNOSIS — I2699 Other pulmonary embolism without acute cor pulmonale: Secondary | ICD-10-CM | POA: Diagnosis not present

## 2019-12-18 DIAGNOSIS — J849 Interstitial pulmonary disease, unspecified: Secondary | ICD-10-CM

## 2019-12-18 DIAGNOSIS — J449 Chronic obstructive pulmonary disease, unspecified: Secondary | ICD-10-CM

## 2019-12-18 DIAGNOSIS — R911 Solitary pulmonary nodule: Secondary | ICD-10-CM

## 2019-12-18 LAB — POCT I-STAT CREATININE: Creatinine, Ser: 1 mg/dL (ref 0.44–1.00)

## 2019-12-18 MED ORDER — IOHEXOL 350 MG/ML SOLN
100.0000 mL | Freq: Once | INTRAVENOUS | Status: AC | PRN
  Administered 2019-12-18: 100 mL via INTRAVENOUS

## 2019-12-18 NOTE — Telephone Encounter (Signed)
Discussed CT findings with patient and daughter [name] over the phone Explained high likelihood of malignancy in this situation Discussed with IR Dr. Laurence Ferrari who feels that mass would be very amenable to CT-guided biopsy  Please schedule -PET scan -CT-guided needle biopsy by IR -Office visit with me after

## 2019-12-18 NOTE — Telephone Encounter (Signed)
Orders have been placed.

## 2019-12-18 NOTE — Telephone Encounter (Signed)
Received call report from [facility] with [facility] on patient's CTA done on 12/18/19.  RA please review the result/impression copied below:  IMPRESSION: 1. Dense consolidation in the LEFT upper lobe involving the apex andlateral pleural surface with differential including bronchogenic carcinoma versus lobar pneumonia. With the scattered nodules withinthe LEFT lower lobe and RIGHT lung findings are concerning forbronchogenic carcinoma. Consider bronchoscopy and tissue sampling.Upper lobe emphysema.  2. LEFT hilar adenopathy. Nonpathologic enlarged LEFT supraclavicular node. 3. Small pericardial effusion. 4. Coronary artery calcification and Aortic Atherosclerosis (ICD10-I70.0). 5. No evidence acute pulmonary embolism.  Please advise, thank you.

## 2019-12-25 ENCOUNTER — Telehealth: Payer: Self-pay | Admitting: Pulmonary Disease

## 2019-12-25 NOTE — Telephone Encounter (Signed)
ATC Kristy, call would not connect x2. Will try back.

## 2019-12-26 NOTE — Telephone Encounter (Signed)
Spoke with Marita [place]  She is calling to get update on getting ct biopsy scheduled  I see this was ordered  Forwarding to [facility] per protocol  Thanks!

## 2019-12-26 NOTE — Telephone Encounter (Signed)
Per note on CT Biopsy order they are waiting for pt to have  PET on 6/18.  Called [place] and gave her this info and she states ok.  I offered phone # to Central Scheduling & she states she does not need it - that was all she wanted to know.  Nothing further needed.

## 2019-12-26 NOTE — Progress Notes (Deleted)
CARDIOLOGY CONSULT NOTE       Patient ID: Kayla Sawyer MRN: [id number] DOB/AGE: 12-25-1945 74 y.o.  Admit date: (Not on file) Referring Physician: [place] Crane Primary Physician: Celene Squibb, MD Primary Cardiologist: New Reason for Consultation: Fatigue and Dsypnea  Active Problems:   * No active hospital problems. *   HPI:  74 y.o. with history of COPD, GERD, Arthritis current smoker She complained to primary during office visit on 11/05/19 of fatigue and exertional dyspnea She gets tired just taking a shower and walking to kitchen No PND/Orhopnea no new edema. No history of , DCM, arrhythmia or valve disease. Lab work reviewed from April reviewed and normal including Hct not pre renal , LDL 73 Thyroid studies not done Denies fever chills or focal infectious symptoms She is on Sertraline for depression She has PVD and is followed by [facility] Has had aortobifemoral bypass graft with Dr Trula Slade   Previously seen by Dr Jeffie Pollock and Fletcher Anon.  Cath in June 2017 with non obstructive CAD normal EF PA 40/11 mean 26 mmHg Echo in 2017 with EF 60-65% She use to see Dr Chase Caller for her COPD and ? Scleroderma  Last ABI;s in 2019 were normal   ***    ROS All other systems reviewed and negative except as noted above  Past Medical History:  Diagnosis Date  . Arthritis   . COPD (chronic obstructive pulmonary disease) ([place])   . Depression   . E. coli pyelonephritis 6//2014  . GERD (gastroesophageal reflux disease)   . Pneumonia   . Pulmonary hypertension ([place])   . Sepsis ([place])     Family History  Problem Relation Age of Onset  . Diabetes Mother   . Emphysema Father     Social History   Socioeconomic History  . Marital status: Married    Spouse name: Not on file  . Number of children: Not on file  . Years of education: Not on file  . Highest education level: Not on file  Occupational History  . Not on file  Tobacco Use  . Smoking status: Current Every Day Smoker    Packs/day: 0.50     Years: 50.00    Pack years: 25.00    Types: Cigarettes  . Smokeless tobacco: Never Used  . Tobacco comment: 1/2-3/4 ppd 1.29.18  Vaping Use  . Vaping Use: Never used  Substance and Sexual Activity  . Alcohol use: No    Alcohol/week: 0.0 standard drinks  . Drug use: No  . Sexual activity: Never  Other Topics Concern  . Not on file  Social History Narrative  . Not on file   Social Determinants of Health   Financial Resource Strain:   . Difficulty of Paying Living Expenses:   Food Insecurity:   . Worried About Charity fundraiser in the Last Year:   . Arboriculturist in the Last Year:   Transportation Needs:   . Film/video editor (Medical):   Marland Kitchen Lack of Transportation (Non-Medical):   Physical Activity:   . Days of Exercise per Week:   . Minutes of Exercise per Session:   Stress:   . Feeling of Stress :   Social Connections:   . Frequency of Communication with Friends and Family:   . Frequency of Social Gatherings with Friends and Family:   . Attends Religious Services:   . Active Member of Clubs or Organizations:   . Attends Archivist Meetings:   Marland Kitchen Marital Status:  Intimate Partner Violence:   . Fear of Current or Ex-Partner:   . Emotionally Abused:   Marland Kitchen Physically Abused:   . Sexually Abused:     Past Surgical History:  Procedure Laterality Date  . ABDOMINAL HYSTERECTOMY     partial  . AORTA - BILATERAL FEMORAL ARTERY BYPASS GRAFT Bilateral 06/14/2016   Procedure: AORTOBIFEMORAL BYPASS GRAFT AORTA SMA BYPASS GRAFT RE-IMPLANTATION  OF SUPERIOR MESENTERIC ARTERY;  Surgeon: Serafina Mitchell, MD;  Location: [facility];  Service: Vascular;  Laterality: Bilateral;  . BACK SURGERY     L4/5  . CARDIAC CATHETERIZATION N/A 01/03/2016   Procedure: Right/Left Heart Cath and Coronary Angiography;  Surgeon: Jolaine Artist, MD;  Location: [facility];  Service: Cardiovascular;  Laterality: N/A;  . COLONOSCOPY N/A 08/05/2015   Procedure: COLONOSCOPY;  Surgeon:  Rogene Houston, MD;  [facility];  Service: Endoscopy;  Laterality: N/A;  730  . ELBOW SURGERY    . PERIPHERAL VASCULAR CATHETERIZATION N/A 02/16/2016   Procedure: Abdominal Aortogram w/Lower Extremity;  Surgeon: Wellington Hampshire, MD;  Location: [facility];  Service: Cardiovascular;  Laterality: N/A;      Current Outpatient Medications:  .  acetaminophen (TYLENOL) 500 MG tablet, Take 500 mg by mouth every 6 (six) hours as needed for mild pain., Disp: , Rfl:  .  albuterol (PROVENTIL) (2.5 MG/3ML) 0.083% nebulizer solution, Take 3 mLs (2.5 mg total) by nebulization every 6 (six) hours as needed for wheezing or shortness of breath., Disp: 75 mL, Rfl: 12 .  aspirin EC 81 MG tablet, Take 1 tablet (81 mg total) by mouth daily., Disp: , Rfl:  .  atorvastatin (LIPITOR) 20 MG tablet, Take 1 tablet (20 mg total) by mouth daily. Please schedule yearly appt for more refills, thanks! [phone] 1st attmpt, Disp: 30 tablet, Rfl: 0 .  cholecalciferol (VITAMIN D) 1000 units tablet, Take 1,000 Units by mouth daily., Disp: , Rfl:  .  Fexofenadine HCl (ALLEGRA ALLERGY PO), Take by mouth., Disp: , Rfl:  .  levocetirizine (XYZAL) 5 MG tablet, , Disp: , Rfl:  .  Multiple Vitamin (MULTIVITAMIN WITH MINERALS) TABS tablet, Take 1 tablet by mouth daily., Disp: , Rfl:  .  omeprazole (PRILOSEC) 20 MG capsule, Take 20 mg by mouth daily. , Disp: , Rfl:  .  oxyCODONE-acetaminophen (PERCOCET) 5-325 MG tablet, Take 1-2 tablets by mouth every 4 (four) hours as needed., Disp: 20 tablet, Rfl: 0 .  predniSONE (DELTASONE) 10 MG tablet, Take 2 tabs daily with food x 5days, then 1 tab daily with food x 5days then STOP, Disp: 15 tablet, Rfl: 0 .  sertraline (ZOLOFT) 50 MG tablet, Take 50 mg by mouth daily., Disp: , Rfl:  .  TRELEGY ELLIPTA 100-62.5-25 MCG/INH AEPB, Inhale 1 puff into the lungs daily., Disp: , Rfl:     Physical Exam: There were no vitals taken for this visit.    Affect appropriate Chronically  ill female  HEENT: normal Neck supple with no adenopathy JVP normal no bruits no thyromegaly Lungs exp wheezes COPD  Heart:  S1/S2 no murmur, no rub, gallop or click PMI normal Abdomen: benighn, BS positve, no tenderness, no AAA Post aortobifem bypass grafting  Distal pulses intact with no bruits No edema Neuro non-focal Skin warm and dry No muscular weakness   Labs:   Lab Results  Component Value Date   WBC 11.2 (H) 06/19/2016   HGB 10.4 (L) 06/19/2016   HCT 30.4 (L) 06/19/2016  MCV 88.9 06/19/2016   PLT 148 (L) 06/19/2016   No results for input(s): NA, K, CL, CO2, BUN, CREATININE, CALCIUM, PROT, BILITOT, ALKPHOS, ALT, AST, GLUCOSE in the last 168 hours.  Invalid input(s): LABALBU Lab Results  Component Value Date   CKTOTAL 126 07/07/2011   CKMB 4.0 07/07/2011   TROPONINI <0.03 06/15/2016   No results found for: CHOL No results found for: HDL No results found for: LDLCALC No results found for: TRIG No results found for: CHOLHDL No results found for: LDLDIRECT    Radiology: CT Angio Chest W/Cm &/Or Wo Cm  Result Date: 12/18/2019 CLINICAL DATA:  Acute pulmonary embolism EXAM: CT ANGIOGRAPHY CHEST WITH CONTRAST TECHNIQUE: Multidetector CT imaging of the chest was performed using the standard protocol during bolus administration of intravenous contrast. Multiplanar CT image reconstructions and MIPs were obtained to evaluate the vascular anatomy. CONTRAST:  13mL OMNIPAQUE IOHEXOL 350 MG/ML SOLN COMPARISON:  CT 07/31/2016 FINDINGS: Cardiovascular: No filling defects within the pulmonary arteries to suggest acute pulmonary embolism. No acute findings aorta great vessels. Coronary calcifications noted. Mediastinum/Nodes: No axillary or supraclavicular adenopathy. LEFT suprahilar lymph node is not pathologically enlarged at 7 mm (image 16/4). LEFT hilar lymph node measures 12 mm (image 49/4.) small pericardial effusion. Lungs/Pleura: Dense consolidation within the anterior LEFT  upper lobe with consolidative mass measuring 4.9 by 3.5 cm. This mass like consolidation shares a broad surface with the pleura. Consolidative process extends into the lateral margin of the LEFT upper lobe. Along the margins of this mass there are several discrete nodules. For example 9 mm nodule on image 137/5 and lateral nodule measuring 7 mm on image 144/5. Within the LEFT lower lobe, there is a rounded nodule measuring 10 mm (image 112/6). Within the RIGHT lung there is a nodule along the fissure measuring 5 mm which is new from comparison exam 2018. A second slightly more superior nodule in the RIGHT upper lobe on image 87/6. There is extensive centrilobular emphysema the upper lobes. Upper Abdomen: Limited view of the liver, kidneys, pancreas are unremarkable. Normal adrenal glands. Musculoskeletal: No aggressive osseous lesion. Review of the MIP images confirms the above findings. IMPRESSION: 1. Dense consolidation in the LEFT upper lobe involving the apex and lateral pleural surface with differential including bronchogenic carcinoma versus lobar pneumonia. With the scattered nodules within the LEFT lower lobe and RIGHT lung findings are concerning for bronchogenic carcinoma. Consider bronchoscopy and tissue sampling. Upper lobe emphysema. 2. LEFT hilar adenopathy. Nonpathologic enlarged LEFT supraclavicular node. 3. Small pericardial effusion. 4. Coronary artery calcification and Aortic Atherosclerosis (ICD10-I70.0). 5. No evidence acute pulmonary embolism. These results will be called to the ordering clinician or representative by the Radiologist Assistant, and communication documented in the PACS or [employer]. Electronically Signed   By: Suzy Bouchard M.D.   On: 12/18/2019 12:52    EKG: 2018 SR rate 91 poor R wave progression RAE   ASSESSMENT AND PLAN:   1. Dsypnea:  Likely related to COPD with ongoing smoking will order lung cancer screening CT and refer back to Dr Chase Caller pulmonary  Check echo to make sure EF still normal and estimate PA pressure No previous pulmonary HTN on cath in 2017  2. PVD:  Post aortobifem f/u [facility] ABI's normal   3. Fatigue : doubt cardiac check TSH/T4 other labs ok see above   4. HLD  Continue statin LDL at goal   5. Depression:  On Zoloft f/u primary   Echo for dyspnea Lung Cancer screening  CT Refer back to pulmonary Dr Chase Caller COPD Refer back to [facility] Dr Trula Slade PVD   F/U cardiology PRN if echo normal   Signed: Jenkins Rouge 12/26/2019, 1:27 PM

## 2019-12-31 ENCOUNTER — Ambulatory Visit: Payer: Medicare Other | Admitting: Cardiovascular Disease

## 2020-01-01 ENCOUNTER — Encounter (HOSPITAL_COMMUNITY): Payer: Medicare Other

## 2020-01-01 ENCOUNTER — Ambulatory Visit: Payer: Medicare Other

## 2020-01-02 ENCOUNTER — Other Ambulatory Visit: Payer: Self-pay

## 2020-01-02 ENCOUNTER — Ambulatory Visit (HOSPITAL_COMMUNITY)
Admission: RE | Admit: 2020-01-02 | Discharge: 2020-01-02 | Disposition: A | Discharge status: Home / Self Care | Payer: Medicare Other | Source: Ambulatory Visit | Attending: Pulmonary Disease | Admitting: Pulmonary Disease

## 2020-01-02 DIAGNOSIS — C7802 Secondary malignant neoplasm of left lung: Secondary | ICD-10-CM | POA: Diagnosis not present

## 2020-01-02 DIAGNOSIS — C801 Malignant (primary) neoplasm, unspecified: Secondary | ICD-10-CM | POA: Insufficient documentation

## 2020-01-02 DIAGNOSIS — I2510 Atherosclerotic heart disease of native coronary artery without angina pectoris: Secondary | ICD-10-CM | POA: Insufficient documentation

## 2020-01-02 DIAGNOSIS — J439 Emphysema, unspecified: Secondary | ICD-10-CM | POA: Insufficient documentation

## 2020-01-02 DIAGNOSIS — N200 Calculus of kidney: Secondary | ICD-10-CM | POA: Diagnosis not present

## 2020-01-02 DIAGNOSIS — C7801 Secondary malignant neoplasm of right lung: Secondary | ICD-10-CM | POA: Diagnosis not present

## 2020-01-02 DIAGNOSIS — R911 Solitary pulmonary nodule: Secondary | ICD-10-CM

## 2020-01-02 DIAGNOSIS — I313 Pericardial effusion (noninflammatory): Secondary | ICD-10-CM | POA: Insufficient documentation

## 2020-01-02 DIAGNOSIS — I700 Atherosclerosis of aorta: Secondary | ICD-10-CM | POA: Insufficient documentation

## 2020-01-02 DIAGNOSIS — C771 Secondary and unspecified malignant neoplasm of intrathoracic lymph nodes: Secondary | ICD-10-CM | POA: Insufficient documentation

## 2020-01-02 LAB — GLUCOSE, CAPILLARY: Glucose-Capillary: 81 mg/dL (ref 70–99)

## 2020-01-02 MED ORDER — FLUDEOXYGLUCOSE F - 18 (FDG) INJECTION
7.5200 | Freq: Once | INTRAVENOUS | Status: AC | PRN
  Administered 2020-01-02: 7.52 via INTRAVENOUS

## 2020-01-05 ENCOUNTER — Telehealth: Payer: Self-pay | Admitting: Pulmonary Disease

## 2020-01-05 ENCOUNTER — Encounter (HOSPITAL_COMMUNITY): Payer: Self-pay | Admitting: Radiology

## 2020-01-05 NOTE — Telephone Encounter (Signed)
ATC patient LMTCB to get her on schedule with Dr. Elsworth Soho this week to discuss PET scan results. Per Dr. Elsworth Soho ok to double book if needed.

## 2020-01-05 NOTE — Progress Notes (Signed)
Elyn Aquas Female, 74 y.o., Sep 01, 1945 MRN:  [id number] Phone:  [phone] Jerilynn Mages) PCP:  Celene Squibb, MD Coverage:  [place] With Pulmonology 01/06/2020 at 11:30 AM  RE: CT Biopsy Received: Today Arne Cleveland, MD  Jillyn Hidden Ok   CT core ant LUL mass   DDH       Previous Messages   ----- Message -----  From: Jillyn Hidden  Sent: 01/05/2020  8:53 AM EDT  To: Arne Cleveland, MD  Subject: RE: CT Biopsy                   PET scan has been completed  ----- Message -----  From: Arne Cleveland, MD  Sent: 12/19/2019  3:26 PM EDT  To: Jillyn Hidden  Subject: RE: CT Biopsy                   Please resubmit for bx approval once PET finalized   Thx  DDH    ----- Message -----  From: Garth Bigness D  Sent: 12/19/2019  2:14 PM EDT  To: Ir Procedure Requests  Subject: CT Biopsy                     Procedure:  CT Biopsy   Reason: Lung nodule, abnormal CT scan, multiple nodules in left lower lobe and right   History: CT in computer, NM PET scheduled for 01/02/20   Provider: Rigoberto Noel   Provider Contact: [phone]

## 2020-01-06 ENCOUNTER — Other Ambulatory Visit: Payer: Self-pay

## 2020-01-06 ENCOUNTER — Encounter: Payer: Self-pay | Admitting: Pulmonary Disease

## 2020-01-06 ENCOUNTER — Ambulatory Visit: Payer: Medicare Other | Admitting: Pulmonary Disease

## 2020-01-06 VITALS — BP 108/72 | HR 83 | Temp 96.6°F | Ht 68.0 in | Wt 149.0 lb

## 2020-01-06 DIAGNOSIS — J9611 Chronic respiratory failure with hypoxia: Secondary | ICD-10-CM

## 2020-01-06 DIAGNOSIS — C3430 Malignant neoplasm of lower lobe, unspecified bronchus or lung: Secondary | ICD-10-CM

## 2020-01-06 DIAGNOSIS — J432 Centrilobular emphysema: Secondary | ICD-10-CM | POA: Diagnosis not present

## 2020-01-06 DIAGNOSIS — R918 Other nonspecific abnormal finding of lung field: Secondary | ICD-10-CM

## 2020-01-06 DIAGNOSIS — R040 Epistaxis: Secondary | ICD-10-CM | POA: Diagnosis not present

## 2020-01-06 NOTE — Patient Instructions (Signed)
Biopsy scheduled Proceed with cancer clinic referral to dr [facility] ENT referral for nose bleeds  Use saline drops 3-4 times/ day in each nare

## 2020-01-06 NOTE — Assessment & Plan Note (Signed)
Continue Trelegy.

## 2020-01-06 NOTE — Assessment & Plan Note (Addendum)
Continue 3 L of oxygen Epistaxis likely related to oxygen, use saline drops frequently, referral to ENT to see if there is anything to cauterize

## 2020-01-06 NOTE — Progress Notes (Signed)
   Subjective:    Patient ID: Kayla Sawyer, female    DOB: [birth year], 74 y.o.   MRN: [id number]  HPI  74 year old smoker for follow-up of COPD and chronic hypoxic respiratory failure  Chief Complaint  Patient presents with  . Follow-up    Patient is here to discuss PET scan results. Patient has nosebleeds almost every day sometimes more than once a day. Has humidfied oxygen already and is using afrin nasal spray.    accompanied by her daughter-in law Kayla Sawyer who is a Marine scientist at [employer], ED , son and husband  She has been maintained on 3 L since January   Last note from Dr Chase Caller 07/2016 reviewed - patulous esophagus noted on prior CT >> diagnosis of scleroderma entertained, saw dr Trudie Reed  On her last office visit we pursued CT angiogram for her worsening dyspnea -this showed dense masslike consolidation in the left upper lobe with scattered nodules in both lungs and left hilar adenopathy  PET scan showed hypermetabolism in the left upper lobe mass, left hilum and bilateral nodules  We discussed implication of these findings She reports frequent nosebleeds, compliant with oxygen  Significant tests/ events reviewed  PFTs 11/2015 showed ratio 52 with FEV1 53%, FVC 77%, paradoxical drop with bronhodilator, TLC 99% & DLCO 29%  HRCT 07/2016 RUL nodule regressed, no ILD, moderate centrilobular and paraseptal emphysema  11/2019 she did not desaturate on walking on 3 L pulse oxygen but her heart rate went up from 86-1 37  Review of Systems Patient denies cough, hemoptysis,  chest pain, palpitations, pedal edema, orthopnea, paroxysmal nocturnal dyspnea, lightheadedness, nausea, vomiting, abdominal or  leg pains      Objective:   Physical Exam  Gen. Pleasant,anxious, in no distress ENT - no lesions, no post nasal drip Neck: No JVD, no thyromegaly, no carotid bruits Lungs: no use of accessory muscles, no dullness to percussion, decreased BL without rales or rhonchi   Cardiovascular: Rhythm regular, heart sounds  normal, no murmurs or gallops, no peripheral edema Musculoskeletal: No deformities, no cyanosis or clubbing , no tremors       Assessment & Plan:

## 2020-01-06 NOTE — Assessment & Plan Note (Addendum)
Left upper lobe lung mass, hypermetabolic with scattered bilateral nodules and hilar and mediastinal lymphadenopathy most likely suggests metastatic malignancy, likely lung primary. We discussed implications of this.  We discussed various biopsy options.  She will proceed with CT-guided biopsy by IR  The various options of biopsy including bronchoscopy, CT guided needle aspiration and surgical biopsy were discussed.The risks of each procedure including coughing, bleeding and the  chances of lung puncture requiring chest tube were discussed in great detail. The benefits & alternatives including serial follow up were also discussed.  She expressed that she would not be interested in chemotherapy unless there was a realistic chance of improvement

## 2020-01-07 ENCOUNTER — Ambulatory Visit: Payer: Medicare Other | Admitting: Cardiology

## 2020-01-09 ENCOUNTER — Ambulatory Visit (HOSPITAL_COMMUNITY)
Admission: RE | Admit: 2020-01-09 | Discharge: 2020-01-09 | Disposition: A | Discharge status: Home / Self Care | Payer: Medicare Other | Source: Ambulatory Visit | Attending: Pulmonary Disease | Admitting: Pulmonary Disease

## 2020-01-09 ENCOUNTER — Other Ambulatory Visit: Payer: Self-pay | Admitting: Student

## 2020-01-09 DIAGNOSIS — Z20822 Contact with and (suspected) exposure to covid-19: Secondary | ICD-10-CM | POA: Diagnosis not present

## 2020-01-09 DIAGNOSIS — J849 Interstitial pulmonary disease, unspecified: Secondary | ICD-10-CM | POA: Diagnosis not present

## 2020-01-09 DIAGNOSIS — Z01812 Encounter for preprocedural laboratory examination: Secondary | ICD-10-CM | POA: Diagnosis not present

## 2020-01-09 DIAGNOSIS — J432 Centrilobular emphysema: Secondary | ICD-10-CM | POA: Diagnosis not present

## 2020-01-09 DIAGNOSIS — J449 Chronic obstructive pulmonary disease, unspecified: Secondary | ICD-10-CM | POA: Diagnosis not present

## 2020-01-09 LAB — SARS CORONAVIRUS 2 (TAT 6-24 HRS): SARS Coronavirus 2: NEGATIVE

## 2020-01-12 ENCOUNTER — Ambulatory Visit (HOSPITAL_COMMUNITY): Payer: Medicare Other

## 2020-01-12 ENCOUNTER — Other Ambulatory Visit: Payer: Self-pay

## 2020-01-12 ENCOUNTER — Ambulatory Visit (HOSPITAL_COMMUNITY)
Admission: RE | Admit: 2020-01-12 | Discharge: 2020-01-12 | Disposition: A | Discharge status: Home / Self Care | Payer: Medicare Other | Source: Ambulatory Visit | Attending: Interventional Radiology | Admitting: Interventional Radiology

## 2020-01-12 ENCOUNTER — Encounter (HOSPITAL_COMMUNITY): Payer: Self-pay

## 2020-01-12 ENCOUNTER — Ambulatory Visit (HOSPITAL_COMMUNITY)
Admission: RE | Admit: 2020-01-12 | Discharge: 2020-01-12 | Disposition: A | Discharge status: Home / Self Care | Payer: Medicare Other | Source: Ambulatory Visit | Attending: Pulmonary Disease | Admitting: Pulmonary Disease

## 2020-01-12 DIAGNOSIS — C3412 Malignant neoplasm of upper lobe, left bronchus or lung: Secondary | ICD-10-CM | POA: Insufficient documentation

## 2020-01-12 DIAGNOSIS — J449 Chronic obstructive pulmonary disease, unspecified: Secondary | ICD-10-CM | POA: Diagnosis not present

## 2020-01-12 DIAGNOSIS — Z825 Family history of asthma and other chronic lower respiratory diseases: Secondary | ICD-10-CM | POA: Diagnosis not present

## 2020-01-12 DIAGNOSIS — F17210 Nicotine dependence, cigarettes, uncomplicated: Secondary | ICD-10-CM | POA: Diagnosis not present

## 2020-01-12 DIAGNOSIS — J9611 Chronic respiratory failure with hypoxia: Secondary | ICD-10-CM | POA: Diagnosis not present

## 2020-01-12 DIAGNOSIS — Z79899 Other long term (current) drug therapy: Secondary | ICD-10-CM | POA: Diagnosis not present

## 2020-01-12 DIAGNOSIS — Z833 Family history of diabetes mellitus: Secondary | ICD-10-CM | POA: Diagnosis not present

## 2020-01-12 DIAGNOSIS — Z7982 Long term (current) use of aspirin: Secondary | ICD-10-CM | POA: Diagnosis not present

## 2020-01-12 DIAGNOSIS — C3491 Malignant neoplasm of unspecified part of right bronchus or lung: Secondary | ICD-10-CM | POA: Diagnosis not present

## 2020-01-12 DIAGNOSIS — R911 Solitary pulmonary nodule: Secondary | ICD-10-CM

## 2020-01-12 DIAGNOSIS — Z9981 Dependence on supplemental oxygen: Secondary | ICD-10-CM | POA: Diagnosis not present

## 2020-01-12 DIAGNOSIS — C3492 Malignant neoplasm of unspecified part of left bronchus or lung: Secondary | ICD-10-CM | POA: Diagnosis not present

## 2020-01-12 DIAGNOSIS — J439 Emphysema, unspecified: Secondary | ICD-10-CM | POA: Diagnosis not present

## 2020-01-12 DIAGNOSIS — K219 Gastro-esophageal reflux disease without esophagitis: Secondary | ICD-10-CM | POA: Diagnosis not present

## 2020-01-12 DIAGNOSIS — J95811 Postprocedural pneumothorax: Secondary | ICD-10-CM

## 2020-01-12 DIAGNOSIS — F329 Major depressive disorder, single episode, unspecified: Secondary | ICD-10-CM | POA: Insufficient documentation

## 2020-01-12 DIAGNOSIS — R918 Other nonspecific abnormal finding of lung field: Secondary | ICD-10-CM | POA: Diagnosis not present

## 2020-01-12 LAB — CBC
HCT: 39.5 % (ref 36.0–46.0)
Hemoglobin: 12.6 g/dL (ref 12.0–15.0)
MCH: 29.2 pg (ref 26.0–34.0)
MCHC: 31.9 g/dL (ref 30.0–36.0)
MCV: 91.4 fL (ref 80.0–100.0)
Platelets: 287 10*3/uL (ref 150–400)
RBC: 4.32 MIL/uL (ref 3.87–5.11)
RDW: 15.2 % (ref 11.5–15.5)
WBC: 9.5 10*3/uL (ref 4.0–10.5)
nRBC: 0 % (ref 0.0–0.2)

## 2020-01-12 LAB — PROTIME-INR
INR: 1 (ref 0.8–1.2)
Prothrombin Time: 12.3 seconds (ref 11.4–15.2)

## 2020-01-12 MED ORDER — MIDAZOLAM HCL 2 MG/2ML IJ SOLN
INTRAMUSCULAR | Status: AC | PRN
  Administered 2020-01-12: 1 mg via INTRAVENOUS

## 2020-01-12 MED ORDER — FENTANYL CITRATE (PF) 100 MCG/2ML IJ SOLN
INTRAMUSCULAR | Status: AC
  Filled 2020-01-12: qty 2

## 2020-01-12 MED ORDER — LIDOCAINE HCL 1 % IJ SOLN
INTRAMUSCULAR | Status: AC
  Filled 2020-01-12: qty 20

## 2020-01-12 MED ORDER — GELATIN ABSORBABLE 12-7 MM EX MISC
CUTANEOUS | Status: AC
  Filled 2020-01-12: qty 1

## 2020-01-12 MED ORDER — MIDAZOLAM HCL 2 MG/2ML IJ SOLN
INTRAMUSCULAR | Status: AC
  Filled 2020-01-12: qty 2

## 2020-01-12 MED ORDER — FENTANYL CITRATE (PF) 100 MCG/2ML IJ SOLN
INTRAMUSCULAR | Status: AC | PRN
  Administered 2020-01-12 (×2): 25 ug via INTRAVENOUS

## 2020-01-12 MED ORDER — SODIUM CHLORIDE 0.9 % IV SOLN
INTRAVENOUS | Status: DC
Start: 2020-01-12 — End: 2020-01-13

## 2020-01-12 NOTE — Progress Notes (Signed)
   Post LUL mass biopsy  Pt is doing well No pain No SOB Chest has good sounds-- CTA  Dr Dayna Ramus has reviewed imaging--CXR--  NO PTX seen per Dr Earleen Newport

## 2020-01-12 NOTE — Procedures (Signed)
Interventional Radiology Procedure Note  Procedure: CT guided biopsy of left apical lung mass Complications: None Recommendations: - Bedrest until CXR cleared.  Minimize talking, coughing or otherwise straining.  - Follow up 1 hr CXR pending  - NPO until CXR cleared  Signed,  Corrie Mckusick, DO

## 2020-01-12 NOTE — H&P (Signed)
Chief Complaint: Patient was seen in consultation today for left lung mass biopsy at the request of Alva,Rakesh V  Referring Physician(s): Rigoberto Noel  Supervising Physician: Corrie Mckusick  Patient Status: [facility] - Out-pt  History of Present Illness: Kayla Sawyer is a 74 y.o. female   Smoker COPD Chronic hypoxic resp failure On 3L O2 at home  Worsening dyspnea CT 12/18/19: IMPRESSION: 1. Dense consolidation in the LEFT upper lobe involving the apex and lateral pleural surface with differential including bronchogenic carcinoma versus lobar pneumonia. With the scattered nodules within the LEFT lower lobe and RIGHT lung findings are concerning for bronchogenic carcinoma. Consider bronchoscopy and tissue sampling. Upper lobe emphysema. 2. LEFT hilar adenopathy. Nonpathologic enlarged LEFT supraclavicular node. 3. Small pericardial effusion. 4. Coronary artery calcification and Aortic Atherosclerosis (ICD10-I70.0). 5. No evidence acute pulmonary embolism.  PET 01/02/20: IMPRESSION: 1. Findings most consistent with metastatic left upper lobe primary bronchogenic carcinoma. Metastasis to bilateral lungs, thoracic nodes. No hypermetabolic extrathoracic metastasis identified. 2. Relative right vocal cord cord hypermetabolism without well-defined mass. This can be seen with left recurrent laryngeal nerve paralysis. Correlate with portion is in this patient with AP window nodal disease. 3. Presumably reactive hypermetabolism along the course of the aorta/bifem bypass.   Consulted with Dr Elsworth Soho Now scheduled for biopsy of +PET LUL lesion  Past Medical History:  Diagnosis Date  . Arthritis   . COPD (chronic obstructive pulmonary disease) ([place])   . Depression   . E. coli pyelonephritis 6//2014  . GERD (gastroesophageal reflux disease)   . Pneumonia   . Pulmonary hypertension ([place])   . Sepsis [facility])     Past Surgical History:  Procedure Laterality Date  . ABDOMINAL  HYSTERECTOMY     partial  . AORTA - BILATERAL FEMORAL ARTERY BYPASS GRAFT Bilateral 06/14/2016   Procedure: AORTOBIFEMORAL BYPASS GRAFT AORTA SMA BYPASS GRAFT RE-IMPLANTATION  OF SUPERIOR MESENTERIC ARTERY;  Surgeon: Serafina Mitchell, MD;  Location: [place];  Service: Vascular;  Laterality: Bilateral;  . BACK SURGERY     L4/5  . CARDIAC CATHETERIZATION N/A 01/03/2016   Procedure: Right/Left Heart Cath and Coronary Angiography;  Surgeon: Jolaine Artist, MD;  Location: [place] CV [facility];  Service: Cardiovascular;  Laterality: N/A;  . COLONOSCOPY N/A 08/05/2015   Procedure: COLONOSCOPY;  Surgeon: Rogene Houston, MD;  [facility];  Service: Endoscopy;  Laterality: N/A;  730  . ELBOW SURGERY    . PERIPHERAL VASCULAR CATHETERIZATION N/A 02/16/2016   Procedure: Abdominal Aortogram w/Lower Extremity;  Surgeon: Wellington Hampshire, MD;  Location: [place] [facility];  Service: Cardiovascular;  Laterality: N/A;    Allergies: Sulfur  Medications: Prior to Admission medications   Medication Sig Start Date End Date Taking? Authorizing Provider  acetaminophen (TYLENOL) 500 MG tablet Take 500 mg by mouth every 6 (six) hours as needed for mild pain.   Yes [provider]  albuterol (PROVENTIL) (2.5 MG/3ML) 0.083% nebulizer solution Take 3 mLs (2.5 mg total) by nebulization every 6 (six) hours as needed for wheezing or shortness of breath. 12/09/19  Yes Rigoberto Noel, MD  aspirin EC 81 MG tablet Take 1 tablet (81 mg total) by mouth daily. 02/08/16  Yes Wellington Hampshire, MD  atorvastatin (LIPITOR) 40 MG tablet Take 40 mg by mouth daily.   Yes [provider]  levocetirizine (XYZAL) 5 MG tablet Take 5 mg by mouth every evening.  08/31/16  Yes [provider]  Multiple Vitamin (MULTIVITAMIN WITH MINERALS)  TABS tablet Take 1 tablet by mouth daily.   Yes [provider]  omeprazole (PRILOSEC) 20 MG capsule Take 20 mg by mouth daily.  05/13/15  Yes [provider]  sertraline (ZOLOFT) 100 MG tablet Take 100 mg by mouth daily.  04/19/16  Yes [provider]  TRELEGY ELLIPTA 100-62.5-25 MCG/INH AEPB Inhale 1 puff into the lungs daily. 09/21/19  Yes [provider]     Family History  Problem Relation Age of Onset  . Diabetes Mother   . Emphysema Father     Social History   Socioeconomic History  . Marital status: Married    Spouse name: Not on file  . Number of children: Not on file  . Years of education: Not on file  . Highest education level: Not on file  Occupational History  . Not on file  Tobacco Use  . Smoking status: Current Every Day Smoker    Packs/day: 0.50    Years: 50.00    Pack years: 25.00    Types: Cigarettes  . Smokeless tobacco: Never Used  . Tobacco comment: 1/2-3/4 ppd 1.29.18  Vaping Use  . Vaping Use: Never used  Substance and Sexual Activity  . Alcohol use: No    Alcohol/week: 0.0 standard drinks  . Drug use: No  . Sexual activity: Never  Other Topics Concern  . Not on file  Social History Narrative  . Not on file   Social Determinants of Health   Financial Resource Strain:   . Difficulty of Paying Living Expenses:   Food Insecurity:   . Worried About Charity fundraiser in the Last Year:   . Arboriculturist in the Last Year:   Transportation Needs:   . Film/video editor (Medical):   Marland Kitchen Lack of Transportation (Non-Medical):   Physical Activity:   . Days of Exercise per Week:   . Minutes of Exercise per Session:   Stress:   . Feeling of Stress :   Social Connections:   . Frequency of Communication with Friends and Family:   . Frequency of Social Gatherings with Friends and Family:   . Attends Religious Services:   . Active Member of Clubs or Organizations:   . Attends Archivist Meetings:   Marland Kitchen Marital Status:     Review of Systems: A 12 point ROS discussed and pertinent positives are indicated in the HPI above.  All other systems are negative.  Review of  Systems  Constitutional: Negative for fever.  Respiratory: Positive for shortness of breath.   Cardiovascular: Negative for chest pain.  Gastrointestinal: Negative for abdominal pain.  Musculoskeletal: Positive for gait problem.  Neurological: Positive for weakness.  Psychiatric/Behavioral: Negative for behavioral problems and confusion.    Vital Signs: BP 106/69   Pulse (!) 103   Temp 97.9 F (36.6 C) (Oral)   Resp 14   Ht 5\' 8"  (1.727 m)   Wt 149 lb (67.6 kg)   SpO2 98%   BMI 22.66 kg/m   Physical Exam Vitals reviewed.  Cardiovascular:     Rate and Rhythm: Normal rate and regular rhythm.     Heart sounds: Normal heart sounds.  Pulmonary:     Breath sounds: Wheezing present.  Abdominal:     Palpations: Abdomen is soft.  Musculoskeletal:        General: Normal range of motion.  Skin:    General: Skin is warm and dry.  Neurological:     Mental Status:  She is alert and oriented to person, place, and time.  Psychiatric:        Behavior: Behavior normal.     Imaging: CT Angio Chest W/Cm &/Or Wo Cm  Result Date: 12/18/2019 CLINICAL DATA:  Acute pulmonary embolism EXAM: CT ANGIOGRAPHY CHEST WITH CONTRAST TECHNIQUE: Multidetector CT imaging of the chest was performed using the standard protocol during bolus administration of intravenous contrast. Multiplanar CT image reconstructions and MIPs were obtained to evaluate the vascular anatomy. CONTRAST:  151mL OMNIPAQUE IOHEXOL 350 MG/ML SOLN COMPARISON:  CT 07/31/2016 FINDINGS: Cardiovascular: No filling defects within the pulmonary arteries to suggest acute pulmonary embolism. No acute findings aorta great vessels. Coronary calcifications noted. Mediastinum/Nodes: No axillary or supraclavicular adenopathy. LEFT suprahilar lymph node is not pathologically enlarged at 7 mm (image 16/4). LEFT hilar lymph node measures 12 mm (image 49/4.) small pericardial effusion. Lungs/Pleura: Dense consolidation within the anterior LEFT upper lobe  with consolidative mass measuring 4.9 by 3.5 cm. This mass like consolidation shares a broad surface with the pleura. Consolidative process extends into the lateral margin of the LEFT upper lobe. Along the margins of this mass there are several discrete nodules. For example 9 mm nodule on image 137/5 and lateral nodule measuring 7 mm on image 144/5. Within the LEFT lower lobe, there is a rounded nodule measuring 10 mm (image 112/6). Within the RIGHT lung there is a nodule along the fissure measuring 5 mm which is new from comparison exam 2018. A second slightly more superior nodule in the RIGHT upper lobe on image 87/6. There is extensive centrilobular emphysema the upper lobes. Upper Abdomen: Limited view of the liver, kidneys, pancreas are unremarkable. Normal adrenal glands. Musculoskeletal: No aggressive osseous lesion. Review of the MIP images confirms the above findings. IMPRESSION: 1. Dense consolidation in the LEFT upper lobe involving the apex and lateral pleural surface with differential including bronchogenic carcinoma versus lobar pneumonia. With the scattered nodules within the LEFT lower lobe and RIGHT lung findings are concerning for bronchogenic carcinoma. Consider bronchoscopy and tissue sampling. Upper lobe emphysema. 2. LEFT hilar adenopathy. Nonpathologic enlarged LEFT supraclavicular node. 3. Small pericardial effusion. 4. Coronary artery calcification and Aortic Atherosclerosis (ICD10-I70.0). 5. No evidence acute pulmonary embolism. These results will be called to the ordering clinician or representative by the Radiologist Assistant, and communication documented in the PACS or [employer]. Electronically Signed   By: Suzy Bouchard M.D.   On: 12/18/2019 12:52   NM PET Image Initial (PI) Skull Base To Thigh  Result Date: 01/02/2020 CLINICAL DATA:  Initial treatment strategy for CT demonstrating left upper lobe consolidation and surrounding nodularity, suspicious for bronchogenic  carcinoma. Right upper extremity COVID-19 vaccine x2 in May. EXAM: NUCLEAR MEDICINE PET SKULL BASE TO THIGH TECHNIQUE: 7.5 mCi F-18 FDG was injected intravenously. Full-ring PET imaging was performed from the skull base to thigh after the radiotracer. CT data was obtained and used for attenuation correction and anatomic localization. Fasting blood glucose: 81 mg/dl COMPARISON:  Chest CT 12/18/2019. FINDINGS: Mediastinal blood pool activity: SUV max 3.2 Liver activity: SUV max NA NECK: No cervical nodal hypermetabolism. There is asymmetric relative increased activity involving the right vocal cord at a S.U.V. max of 7.2. No correlate mass. Incidental CT findings: No cervical adenopathy. CHEST: The dominant left upper lobe area of masslike peripheral consolidation anteriorly is persistent and markedly hypermetabolic, including at a S.U.V. max of 16.8 on 19/8. Example at up to 5.9 x 4.3 cm. Left greater than right hypermetabolic pulmonary nodularity. Index  left upper lobe pleural-based pulmonary nodule measures 1.5 cm and a S.U.V. max of 10.0 on 37/8. Compare 1.3 cm on the prior diagnostic CT (when remeasured). Posteromedial right upper lobe pulmonary nodule measures 1.1 x 1.0 cm and a S.U.V. max of 10.2 on 26/8. Similar in size to on the prior. A left lower lobe pulmonary nodule measures 1.2 cm and a S.U.V. max of 7.1 on 56/8. Compare 1.0 cm on the prior diagnostic CT. Extensive hypermetabolic thoracic adenopathy. An index node at the level of thoracic inlet measures 8 mm and a S.U.V. max of 6.4 on 42/4. Enlarged from 7 mm on the prior diagnostic CT. Left hilar hypermetabolism at a S.U.V. max of 11.4. An AP window node measures 1.5 cm and a S.U.V. max of 10.3 on 61/4. Incidental CT findings: Trace pericardial fluid is similar. Aortic and coronary artery atherosclerosis. Advanced bullous type emphysema. ABDOMEN/PELVIS: No abdominopelvic nodal hypermetabolism. Status post aorto/bifem bypass. There is hypermetabolism  along the course of the bypass, including at its origin at a S.U.V. max of 7.8. No surrounding fluid collection or other specific findings to suggest infection. Incidental CT findings: Abdominal aortic atherosclerosis. Normal adrenal glands. Punctate bilateral renal collecting system calculi. Right greater than left renal scarring. Hysterectomy. Pelvic floor laxity. SKELETON: Right rotator cuff hypermetabolism is likely degenerative. No suspicious marrow hypermetabolism. Incidental CT findings: Osteopenia. IMPRESSION: 1. Findings most consistent with metastatic left upper lobe primary bronchogenic carcinoma. Metastasis to bilateral lungs, thoracic nodes. No hypermetabolic extrathoracic metastasis identified. 2. Relative right vocal cord cord hypermetabolism without well-defined mass. This can be seen with left recurrent laryngeal nerve paralysis. Correlate with portion is in this patient with AP window nodal disease. 3. Presumably reactive hypermetabolism along the course of the aorta/bifem bypass. 4. Incidental findings, including: Aortic atherosclerosis (ICD10-I70.0), coronary artery atherosclerosis and emphysema (ICD10-J43.9). Bilateral nephrolithiasis. Renal scarring. Tiny pericardial effusion. Electronically Signed   By: Abigail Miyamoto M.D.   On: 01/02/2020 16:53    Labs:  CBC: No results for input(s): WBC, HGB, HCT, PLT in the last 8760 hours.  COAGS: No results for input(s): INR, APTT in the last 8760 hours.  BMP: Recent Labs    12/18/19 1158  CREATININE 1.00    LIVER FUNCTION TESTS: No results for input(s): BILITOT, AST, ALT, ALKPHOS, PROT, ALBUMIN in the last 8760 hours.  TUMOR MARKERS: No results for input(s): AFPTM, CEA, CA199, CHROMGRNA in the last 8760 hours.  Assessment and Plan:  LUL mass +PET Scheduled for LUL mass biopsy per Dr Elsworth Soho request Risks and benefits of CT guided lung nodule biopsy was discussed with the patient including, but not limited to bleeding, hemoptysis,  respiratory failure requiring intubation, infection, pneumothorax requiring chest tube placement, stroke from air embolism or even death.  All of the patient's questions were answered and the patient is agreeable to proceed. Consent signed and in chart.   Thank you for this interesting consult.  I greatly enjoyed meeting Kayla Sawyer and look forward to participating in their care.  A copy of this report was sent to the requesting provider on this date.  Electronically Signed: Lavonia Drafts, PA-C 01/12/2020, 10:17 AM   I spent a total of  30 Minutes   in face to face in clinical consultation, greater than 50% of which was counseling/coordinating care for LUL mass bx

## 2020-01-12 NOTE — Discharge Instructions (Addendum)
Lung Biopsy, Care After This sheet gives you information about how to care for yourself after your procedure. Your health care provider may also give you more specific instructions depending on the type of biopsy you had. If you have problems or questions, contact your health care provider. What can I expect after the procedure? After the procedure, it is common to have:  A cough.  A sore throat.  Pain where a needle, bronchoscope, or incision was used to collect a biopsy sample (biopsy site). Follow these instructions at home: Medicines  Take over-the-counter and prescription medicines only as told by your health care provider.  Do not drink alcohol if your health care provider tells you not to drink.  Ask your health care provider if the medicine prescribed to you: ? Requires you to avoid driving or using heavy machinery. ? Can cause constipation. You may need to take these actions to prevent or treat constipation:  Drink enough fluid to keep your urine pale yellow.  Take over-the-counter or prescription medicines.  Eat foods that are high in fiber, such as beans, whole grains, and fresh fruits and vegetables.  Limit foods that are high in fat and processed sugars, such as fried or sweet foods.  Do not drive for 24 hours if you were given a sedative. Biopsy site care   Follow instructions from your health care provider about how to take care of your biopsy site. Make sure you: ? Wash your hands with soap and water before and after you change your bandage (dressing). If soap and water are not available, use hand sanitizer. ? Change your dressing as told by your health care provider. ? Leave stitches (sutures), skin glue, or adhesive strips in place. These skin closures may need to stay in place for 2 weeks or longer. If adhesive strip edges start to loosen and curl up, you may trim the loose edges. Do not remove adhesive strips completely unless your health care provider tells  you to do that.  Do not take baths, swim, or use a hot tub until your health care provider approves. Ask your health care provider if you may take showers. You may only be allowed to take sponge baths.  Check your biopsy site every day for signs of infection. Check for: ? Redness, swelling, or more pain. ? Fluid or blood. ? Warmth. ? Pus or a bad smell. General instructions  Return to your normal activities as told by your health care provider. Ask your health care provider what activities are safe for you.  It is up to you to get the results of your procedure. Ask your health care provider, or the department that is doing the procedure, when your results will be ready.  Keep all follow-up visits as told by your health care provider. This is important. Contact a health care provider if:  You have a fever.  You have redness, swelling, or more pain around your biopsy site.  You have fluid or blood coming from your biopsy site.  Your biopsy site feels warm to the touch.  You have pus or a bad smell coming from your biopsy site.  You have pain that does not get better with medicine. Get help right away if:  You cough up blood.  You have trouble breathing.  You have chest pain.  You lose consciousness. Summary  After the procedure, it is common to have a sore throat and a cough.  Return to your normal activities as told by your  health care provider. Ask your health care provider what activities are safe for you.  Take over-the-counter and prescription medicines only as told by your health care provider.  Report any unusual symptoms to your health care provider. This information is not intended to replace advice given to you by your health care provider. Make sure you discuss any questions you have with your health care provider. Document Revised: 08/07/2018 Document Reviewed: 08/01/2016 Elsevier Patient Education  [place]. Moderate Conscious Sedation,  Adult Sedation is the use of medicines to promote relaxation and relieve discomfort and anxiety. Moderate conscious sedation is a type of sedation. Under moderate conscious sedation, you are less alert than normal, but you are still able to respond to instructions, touch, or both. Moderate conscious sedation is used during short medical and dental procedures. It is milder than deep sedation, which is a type of sedation under which you cannot be easily woken up. It is also milder than general anesthesia, which is the use of medicines to make you unconscious. Moderate conscious sedation allows you to return to your regular activities sooner. Tell a health care provider about:  Any allergies you have.  All medicines you are taking, including vitamins, herbs, eye drops, creams, and over-the-counter medicines.  Use of steroids (by mouth or creams).  Any problems you or family members have had with sedatives and anesthetic medicines.  Any blood disorders you have.  Any surgeries you have had.  Any medical conditions you have, such as sleep apnea.  Whether you are pregnant or may be pregnant.  Any use of cigarettes, alcohol, marijuana, or street drugs. What are the risks? Generally, this is a safe procedure. However, problems may occur, including:  Getting too much medicine (oversedation).  Nausea.  Allergic reaction to medicines.  Trouble breathing. If this happens, a breathing tube may be used to help with breathing. It will be removed when you are awake and breathing on your own.  Heart trouble.  Lung trouble. What happens before the procedure? Staying hydrated Follow instructions from your health care provider about hydration, which may include:  Up to 2 hours before the procedure - you may continue to drink clear liquids, such as water, clear fruit juice, black coffee, and plain tea. Eating and drinking restrictions Follow instructions from your health care provider about  eating and drinking, which may include:  8 hours before the procedure - stop eating heavy meals or foods such as meat, fried foods, or fatty foods.  6 hours before the procedure - stop eating light meals or foods, such as toast or cereal.  6 hours before the procedure - stop drinking milk or drinks that contain milk.  2 hours before the procedure - stop drinking clear liquids. Medicine Ask your health care provider about:  Changing or stopping your regular medicines. This is especially important if you are taking diabetes medicines or blood thinners.  Taking medicines such as aspirin and ibuprofen. These medicines can thin your blood. Do not take these medicines before your procedure if your health care provider instructs you not to.  Tests and exams  You will have a physical exam.  You may have blood tests done to show: ? How well your kidneys and liver are working. ? How well your blood can clot. General instructions  Plan to have someone take you home from the hospital or clinic.  If you will be going home right after the procedure, plan to have someone with you for 24 hours.  What happens during the procedure?  An IV tube will be inserted into one of your veins.  Medicine to help you relax (sedative) will be given through the IV tube.  The medical or dental procedure will be performed. What happens after the procedure?  Your blood pressure, heart rate, breathing rate, and blood oxygen level will be monitored often until the medicines you were given have worn off.  Do not drive for 24 hours. This information is not intended to replace advice given to you by your health care provider. Make sure you discuss any questions you have with your health care provider. Document Revised: 06/15/2017 Document Reviewed: 10/23/2015 Elsevier Patient Education  2020 [employer].

## 2020-01-14 DIAGNOSIS — J449 Chronic obstructive pulmonary disease, unspecified: Secondary | ICD-10-CM | POA: Diagnosis not present

## 2020-01-15 ENCOUNTER — Telehealth: Payer: Self-pay | Admitting: Pulmonary Disease

## 2020-01-15 ENCOUNTER — Encounter: Payer: Self-pay | Admitting: *Deleted

## 2020-01-15 DIAGNOSIS — C801 Malignant (primary) neoplasm, unspecified: Secondary | ICD-10-CM

## 2020-01-15 LAB — SURGICAL PATHOLOGY

## 2020-01-15 NOTE — Progress Notes (Signed)
I received referral on Kayla Sawyer today.  I updated new patient coordinator to call and schedule her to be seen on 01/20/20.  Per Dr. Julien Nordmann I will updated pathology on his request for foundation one and PDL 1.

## 2020-01-15 NOTE — Telephone Encounter (Signed)
Ambulatory referral sent to oncology with Dr. Bari Mantis note.

## 2020-01-15 NOTE — Telephone Encounter (Signed)
Discussed biopsy results with patient and her daughter-in-law Marita [place]. Please schedule consultation with oncology. She is leaning towards no chemotherapy but will let her discuss with Dr. Earlie Server

## 2020-01-16 ENCOUNTER — Other Ambulatory Visit: Payer: Self-pay | Admitting: Medical Oncology

## 2020-01-16 ENCOUNTER — Telehealth: Payer: Self-pay | Admitting: Internal Medicine

## 2020-01-16 DIAGNOSIS — R918 Other nonspecific abnormal finding of lung field: Secondary | ICD-10-CM

## 2020-01-16 NOTE — Telephone Encounter (Signed)
Received a new pt referral from Dr. Elsworth Soho for lung cancer. Pt has been cld and scheduled to see Dr. Julien Nordmann on 7/6 at 2pm w/labs at 130pm. Pt aware to arrive 15 minutes early.

## 2020-01-20 ENCOUNTER — Telehealth: Payer: Self-pay | Admitting: *Deleted

## 2020-01-20 ENCOUNTER — Other Ambulatory Visit: Payer: Self-pay

## 2020-01-20 ENCOUNTER — Inpatient Hospital Stay: Payer: Medicare Other

## 2020-01-20 ENCOUNTER — Encounter: Payer: Self-pay | Admitting: *Deleted

## 2020-01-20 ENCOUNTER — Inpatient Hospital Stay: Payer: Medicare Other | Attending: Internal Medicine | Admitting: Internal Medicine

## 2020-01-20 DIAGNOSIS — F17210 Nicotine dependence, cigarettes, uncomplicated: Secondary | ICD-10-CM | POA: Diagnosis not present

## 2020-01-20 DIAGNOSIS — R918 Other nonspecific abnormal finding of lung field: Secondary | ICD-10-CM

## 2020-01-20 DIAGNOSIS — C3490 Malignant neoplasm of unspecified part of unspecified bronchus or lung: Secondary | ICD-10-CM

## 2020-01-20 DIAGNOSIS — J449 Chronic obstructive pulmonary disease, unspecified: Secondary | ICD-10-CM | POA: Diagnosis not present

## 2020-01-20 DIAGNOSIS — C3412 Malignant neoplasm of upper lobe, left bronchus or lung: Secondary | ICD-10-CM

## 2020-01-20 LAB — CMP (CANCER CENTER ONLY)
ALT: 8 U/L (ref 0–44)
AST: 11 U/L — ABNORMAL LOW (ref 15–41)
Albumin: 3.1 g/dL — ABNORMAL LOW (ref 3.5–5.0)
Alkaline Phosphatase: 62 U/L (ref 38–126)
Anion gap: 9 (ref 5–15)
BUN: 17 mg/dL (ref 8–23)
CO2: 25 mmol/L (ref 22–32)
Calcium: 9.1 mg/dL (ref 8.9–10.3)
Chloride: 105 mmol/L (ref 98–111)
Creatinine: 1.03 mg/dL — ABNORMAL HIGH (ref 0.44–1.00)
GFR, Est AFR Am: >60 mL/min (ref >60)
GFR, Estimated: 54 mL/min — ABNORMAL LOW (ref >60)
Glucose, Bld: 88 mg/dL (ref 70–99)
Potassium: 4 mmol/L (ref 3.5–5.1)
Sodium: 139 mmol/L (ref 135–145)
Total Bilirubin: 0.4 mg/dL (ref 0.3–1.2)
Total Protein: 6.7 g/dL (ref 6.5–8.1)

## 2020-01-20 LAB — CBC WITH DIFFERENTIAL (CANCER CENTER ONLY)
Abs Immature Granulocytes: 0.03 10*3/uL (ref 0.00–0.07)
Basophils Absolute: 0.1 10*3/uL (ref 0.0–0.1)
Basophils Relative: 1 %
Eosinophils Absolute: 0.3 10*3/uL (ref 0.0–0.5)
Eosinophils Relative: 3 %
HCT: 38.1 % (ref 36.0–46.0)
Hemoglobin: 12.3 g/dL (ref 12.0–15.0)
Immature Granulocytes: 0 %
Lymphocytes Relative: 16 %
Lymphs Abs: 1.5 10*3/uL (ref 0.7–4.0)
MCH: 28.5 pg (ref 26.0–34.0)
MCHC: 32.3 g/dL (ref 30.0–36.0)
MCV: 88.4 fL (ref 80.0–100.0)
Monocytes Absolute: 1 10*3/uL (ref 0.1–1.0)
Monocytes Relative: 10 %
Neutro Abs: 6.5 10*3/uL (ref 1.7–7.7)
Neutrophils Relative %: 70 %
Platelet Count: 303 10*3/uL (ref 150–400)
RBC: 4.31 MIL/uL (ref 3.87–5.11)
RDW: 14.8 % (ref 11.5–15.5)
WBC Count: 9.3 10*3/uL (ref 4.0–10.5)
nRBC: 0 % (ref 0.0–0.2)

## 2020-01-20 NOTE — Telephone Encounter (Signed)
Pathology dept updated me that Foundation One and PDL 1 was sent today.

## 2020-01-20 NOTE — Progress Notes (Signed)
Per Dr. Julien Nordmann, I updated path dept on foundation one and PDL 1 request on recent tissue.

## 2020-01-20 NOTE — Progress Notes (Signed)
[place] [phone]   [phone]  CONSULT NOTE  REFERRING PHYSICIAN: Dr. Kara Mead  REASON FOR CONSULTATION:  74 years old white female recently diagnosed with lung cancer.  HPI Kayla Sawyer is a 74 y.o. female with past medical history significant for COPD, pulmonary hypertension, GERD, depression as well as osteoarthritis and long history of smoking.  The patient mention that she has been on oxygen since January 2021.  Over the last few months she has been complaining of increasing fatigue and weakness as well as shortness of breath.  She was seen by Dr. Elsworth Soho for evaluation of her COPD and he ordered CT angiogram of the chest on 12/18/2019 to rule out pulmonary embolism.  The scan showed dense consolidation within the anterior left upper lobe with consolidative mass measuring 4.9 x 3.5 cm.  This masslike consolidation shares a broad service with the pleura.  The consolidative process extends into the lateral margin of the left upper lobe.  Along the margins of this mass there are several discrete nodules including 0.9 cm nodule and lateral nodule measuring 0.7 cm.  Within the left lower lobe there is a rounded nodule measuring 1.0 cm.  Within the right lung there is a nodule along the fissure measuring 0.5 cm which is new compared to the exam of 2018 and a second slightly more superior nodule in the right upper lobe.  The scan showed a left suprahilar node that is not pathologically enlarged and measures 0.7 cm in addition to left hilar node measuring 1.2 cm and small pericardial effusion.  A PET scan was performed on 01/02/2020 and showed finding most consistent with metastatic left upper lobe primary bronchogenic carcinoma with metastasis to bilateral lungs, thoracic nodes but no hypermetabolic extrathoracic metastases identified.  On January 12, 2020 the patient underwent CT-guided core biopsy of the left upper lobe lung mass by interventional radiology.  The  final pathology [id number]) showed poorly differentiated adenocarcinoma. By immunohistochemistry, the neoplastic cells are positive for TTF-1 but negative for P 40 and cytokeratin 5/6. Overall, the immunophenotype and morphology are consistent with a poorly differentiated adenocarcinoma.  Dr. Elsworth Soho kindly referred the patient to me today for evaluation and recommendation regarding treatment of her condition.  When seen today the patient continues to have shortness of breath at baseline and she is currently on home oxygen.  She also has productive cough with whitish sputum but no significant chest pain or hemoptysis.  She lost around 5 pounds in the last 3 months.  She denied having any nausea, vomiting, diarrhea or constipation.  She denied having any headache or visual changes. Family history significant for mother with diabetes mellitus and father with COPD. The patient is married and has 2 children.  She was accompanied today by her husband Kayla Sawyer and her son Kayla Sawyer was available by FaceTime during the visit.  The patient used to work in hospitality.  She has a history of smoking 1 pack/day for around 55 years and unfortunately she continues to smoke.  She has no history of alcohol or drug abuse. HPI  Past Medical History:  Diagnosis Date  . Arthritis   . COPD (chronic obstructive pulmonary disease) ([place])   . Depression   . E. coli pyelonephritis 6//2014  . GERD (gastroesophageal reflux disease)   . Pneumonia   . Pulmonary hypertension ([place])   . Sepsis [facility])     Past Surgical History:  Procedure Laterality Date  . ABDOMINAL HYSTERECTOMY  partial  . AORTA - BILATERAL FEMORAL ARTERY BYPASS GRAFT Bilateral 06/14/2016   Procedure: AORTOBIFEMORAL BYPASS GRAFT AORTA SMA BYPASS GRAFT RE-IMPLANTATION  OF SUPERIOR MESENTERIC ARTERY;  Surgeon: Serafina Mitchell, MD;  Location: [facility];  Service: Vascular;  Laterality: Bilateral;  . BACK SURGERY     L4/5  . CARDIAC CATHETERIZATION N/A 01/03/2016     Procedure: Right/Left Heart Cath and Coronary Angiography;  Surgeon: Jolaine Artist, MD;  Location: [facility];  Service: Cardiovascular;  Laterality: N/A;  . COLONOSCOPY N/A 08/05/2015   Procedure: COLONOSCOPY;  Surgeon: Rogene Houston, MD;  [facility];  Service: Endoscopy;  Laterality: N/A;  730  . ELBOW SURGERY    . PERIPHERAL VASCULAR CATHETERIZATION N/A 02/16/2016   Procedure: Abdominal Aortogram w/Lower Extremity;  Surgeon: Wellington Hampshire, MD;  Location: [facility];  Service: Cardiovascular;  Laterality: N/A;    Family History  Problem Relation Age of Onset  . Diabetes Mother   . Emphysema Father     Social History Social History   Tobacco Use  . Smoking status: Current Every Day Smoker    Packs/day: 0.50    Years: 50.00    Pack years: 25.00    Types: Cigarettes  . Smokeless tobacco: Never Used  . Tobacco comment: 1/2-3/4 ppd 1.29.18  Vaping Use  . Vaping Use: Never used  Substance Use Topics  . Alcohol use: No    Alcohol/week: 0.0 standard drinks  . Drug use: No    Allergies  Allergen Reactions  . Sulfur Nausea Only    Current Outpatient Medications  Medication Sig Dispense Refill  . acetaminophen (TYLENOL) 500 MG tablet Take 500 mg by mouth every 6 (six) hours as needed for mild pain.    Marland Kitchen albuterol (PROVENTIL) (2.5 MG/3ML) 0.083% nebulizer solution Take 3 mLs (2.5 mg total) by nebulization every 6 (six) hours as needed for wheezing or shortness of breath. 75 mL 12  . aspirin EC 81 MG tablet Take 1 tablet (81 mg total) by mouth daily.    Marland Kitchen atorvastatin (LIPITOR) 40 MG tablet Take 40 mg by mouth daily.    Marland Kitchen levocetirizine (XYZAL) 5 MG tablet Take 5 mg by mouth every evening.     . Multiple Vitamin (MULTIVITAMIN WITH MINERALS) TABS tablet Take 1 tablet by mouth daily.    Marland Kitchen omeprazole (PRILOSEC) 20 MG capsule Take 20 mg by mouth daily.     . sertraline (ZOLOFT) 100 MG tablet Take 100 mg by mouth daily.     . TRELEGY ELLIPTA  100-62.5-25 MCG/INH AEPB Inhale 1 puff into the lungs daily.     No current facility-administered medications for this visit.    Review of Systems  Constitutional: positive for fatigue and weight loss Eyes: negative Ears, nose, mouth, throat, and face: negative Respiratory: positive for cough and dyspnea on exertion Cardiovascular: negative Gastrointestinal: negative Genitourinary:negative Integument/breast: negative Hematologic/lymphatic: negative Musculoskeletal:negative Neurological: positive for weakness Behavioral/Psych: negative Endocrine: negative Allergic/Immunologic: negative  Physical Exam  PTW:SFKCL, healthy, no distress, well nourished, well developed and anxious SKIN: skin color, texture, turgor are normal, no rashes or significant lesions HEAD: Normocephalic, No masses, lesions, tenderness or abnormalities EYES: normal, PERRLA, Conjunctiva are pink and non-injected EARS: External ears normal, Canals clear OROPHARYNX:no exudate, no erythema and lips, buccal mucosa, and tongue normal  NECK: supple, no adenopathy, no JVD LYMPH:  no palpable lymphadenopathy, no hepatosplenomegaly BREAST:not examined LUNGS: prolonged expiratory phase, expiratory wheezes bilaterally HEART: regular rate & rhythm, no  murmurs and no gallops ABDOMEN:abdomen soft, non-tender, normal bowel sounds and no masses or organomegaly BACK: Back symmetric, no curvature., No CVA tenderness EXTREMITIES:no joint deformities, effusion, or inflammation, no edema  NEURO: alert & oriented x 3 with fluent speech, no focal motor/sensory deficits  PERFORMANCE STATUS: ECOG 1  LABORATORY DATA: Lab Results  Component Value Date   WBC 9.3 01/20/2020   HGB 12.3 01/20/2020   HCT 38.1 01/20/2020   MCV 88.4 01/20/2020   PLT 303 01/20/2020      Chemistry      Component Value Date/Time   NA 139 01/20/2020 1342   K 4.0 01/20/2020 1342   CL 105 01/20/2020 1342   CO2 25 01/20/2020 1342   BUN 17  01/20/2020 1342   CREATININE 1.03 (H) 01/20/2020 1342   CREATININE 0.90 02/08/2016 0949      Component Value Date/Time   CALCIUM 9.1 01/20/2020 1342   ALKPHOS 62 01/20/2020 1342   AST 11 (L) 01/20/2020 1342   ALT 8 01/20/2020 1342   BILITOT 0.4 01/20/2020 1342       RADIOGRAPHIC STUDIES: NM PET Image Initial (PI) Skull Base To Thigh  Result Date: 01/02/2020 CLINICAL DATA:  Initial treatment strategy for CT demonstrating left upper lobe consolidation and surrounding nodularity, suspicious for bronchogenic carcinoma. Right upper extremity COVID-19 vaccine x2 in May. EXAM: NUCLEAR MEDICINE PET SKULL BASE TO THIGH TECHNIQUE: 7.5 mCi F-18 FDG was injected intravenously. Full-ring PET imaging was performed from the skull base to thigh after the radiotracer. CT data was obtained and used for attenuation correction and anatomic localization. Fasting blood glucose: 81 mg/dl COMPARISON:  Chest CT 12/18/2019. FINDINGS: Mediastinal blood pool activity: SUV max 3.2 Liver activity: SUV max NA NECK: No cervical nodal hypermetabolism. There is asymmetric relative increased activity involving the right vocal cord at a S.U.V. max of 7.2. No correlate mass. Incidental CT findings: No cervical adenopathy. CHEST: The dominant left upper lobe area of masslike peripheral consolidation anteriorly is persistent and markedly hypermetabolic, including at a S.U.V. max of 16.8 on 19/8. Example at up to 5.9 x 4.3 cm. Left greater than right hypermetabolic pulmonary nodularity. Index left upper lobe pleural-based pulmonary nodule measures 1.5 cm and a S.U.V. max of 10.0 on 37/8. Compare 1.3 cm on the prior diagnostic CT (when remeasured). Posteromedial right upper lobe pulmonary nodule measures 1.1 x 1.0 cm and a S.U.V. max of 10.2 on 26/8. Similar in size to on the prior. A left lower lobe pulmonary nodule measures 1.2 cm and a S.U.V. max of 7.1 on 56/8. Compare 1.0 cm on the prior diagnostic CT. Extensive hypermetabolic  thoracic adenopathy. An index node at the level of thoracic inlet measures 8 mm and a S.U.V. max of 6.4 on 42/4. Enlarged from 7 mm on the prior diagnostic CT. Left hilar hypermetabolism at a S.U.V. max of 11.4. An AP window node measures 1.5 cm and a S.U.V. max of 10.3 on 61/4. Incidental CT findings: Trace pericardial fluid is similar. Aortic and coronary artery atherosclerosis. Advanced bullous type emphysema. ABDOMEN/PELVIS: No abdominopelvic nodal hypermetabolism. Status post aorto/bifem bypass. There is hypermetabolism along the course of the bypass, including at its origin at a S.U.V. max of 7.8. No surrounding fluid collection or other specific findings to suggest infection. Incidental CT findings: Abdominal aortic atherosclerosis. Normal adrenal glands. Punctate bilateral renal collecting system calculi. Right greater than left renal scarring. Hysterectomy. Pelvic floor laxity. SKELETON: Right rotator cuff hypermetabolism is likely degenerative. No suspicious marrow hypermetabolism. Incidental CT findings:  Osteopenia. IMPRESSION: 1. Findings most consistent with metastatic left upper lobe primary bronchogenic carcinoma. Metastasis to bilateral lungs, thoracic nodes. No hypermetabolic extrathoracic metastasis identified. 2. Relative right vocal cord cord hypermetabolism without well-defined mass. This can be seen with left recurrent laryngeal nerve paralysis. Correlate with portion is in this patient with AP window nodal disease. 3. Presumably reactive hypermetabolism along the course of the aorta/bifem bypass. 4. Incidental findings, including: Aortic atherosclerosis (ICD10-I70.0), coronary artery atherosclerosis and emphysema (ICD10-J43.9). Bilateral nephrolithiasis. Renal scarring. Tiny pericardial effusion. Electronically Signed   By: Abigail Miyamoto M.D.   On: 01/02/2020 16:53   CT BIOPSY  Result Date: 01/12/2020 INDICATION: 74 year old female with a history of left upper lobe lung mass EXAM: CT BIOPSY  MEDICATIONS: None. ANESTHESIA/SEDATION: Moderate (conscious) sedation was employed during this procedure. A total of Versed 1.0 mg and Fentanyl 50 mcg was administered intravenously. Moderate Sedation Time: 20 minutes. The patient's level of consciousness and vital signs were monitored continuously by radiology nursing throughout the procedure under my direct supervision. FLUOROSCOPY TIME:  CT COMPLICATIONS: None PROCEDURE: The procedure, risks, benefits, and alternatives were explained to the patient and the patient's family. Specific risks that were addressed included bleeding, infection, pneumothorax, need for further procedure including chest tube placement, chance of delayed pneumothorax or hemorrhage, hemoptysis, nondiagnostic sample, cardiopulmonary collapse, death. Questions regarding the procedure were encouraged and answered. The patient understands and consents to the procedure. Patient was positioned in the supine position on the CT gantry table and a scout CT of the chest was performed for planning purposes. Once angle of approach was determined, the skin and subcutaneous tissues this scan was prepped and draped in the usual sterile fashion, and a sterile drape was applied covering the operative field. A sterile gown and sterile gloves were used for the procedure. Local anesthesia was provided with 1% Lidocaine. The skin and subcutaneous tissues were infiltrated 1% lidocaine for local anesthesia, and a small stab incision was made with an 11 blade scalpel. Using CT guidance, a 17 gauge trocar needle was advanced into the left upper lobetarget. After confirmation of the tip, separate 18 gauge core biopsies were performed. These were placed into solution for transportation to the lab. Biosentry Device was deployed. A final CT image was performed. Patient tolerated the procedure well and remained hemodynamically stable throughout. No complications were encountered and no significant blood loss was  encounter IMPRESSION: Status post CT-guided biopsy of left upper lobe lung mass. Tissue specimen sent to pathology for complete histopathologic analysis. Signed, Dulcy Fanny. Dellia Nims, RPVI [facility] Radiology Electronically Signed   By: Corrie Mckusick D.O.   On: 01/12/2020 12:38   DG Chest Port 1 View  Result Date: 01/12/2020 CLINICAL DATA:  Status post needle biopsy of left lung mass. EXAM: PORTABLE CHEST 1 VIEW COMPARISON:  09/17/2017 FINDINGS: Heart size is normal. Mild tortuosity of thoracic aorta is again seen. A probable small approximately 10% left apical pneumothorax is seen. Confluency opacity is also seen in the left upper lobe. Pulmonary emphysema again noted. IMPRESSION: 1. Probable small approximately 10% left apical pneumothorax. 2. Left upper lobe airspace opacity. 3. Emphysema. Electronically Signed   By: Marlaine Hind M.D.   On: 01/12/2020 13:16    ASSESSMENT: This is a very pleasant 74 years old white female recently diagnosed with stage IV (T3, N2, M1a) non-small cell lung cancer, poorly differentiated adenocarcinoma presented with large left upper lobe lung mass in addition to mediastinal lymphadenopathy and bilateral pulmonary  nodules diagnosed in June 2021.   PLAN: I had a lengthy discussion with the patient and her family today about her current disease stage, prognosis and treatment options. I personally and independently reviewed the scan images and discussed the result and showed the images to the patient and her family. I recommended for the patient to complete the staging work-up by ordering MRI of the brain to rule out brain metastasis. I discussed with the patient her treatment options and she understand that she has incurable condition and all the treatment will be of palliative nature. I recommended for the patient to have the tissue biopsy sent for molecular studies as well as PD-L1 expression. I discussed with the  patient the treatment options including palliative care and hospice referral versus consideration of palliative systemic chemotherapy with a combination of carboplatin, paclitaxel and Keytruda if she has no actionable mutations versus treatment with targeted therapy if the molecular studies showed an actionable mutation. The patient is currently asymptomatic except from the shortness of breath secondary to her COPD. I recommended for her to wait until the molecular studies are available before proceeding with systemic therapy. I will arrange for the patient to come back for follow-up visit in around 2 weeks for more detailed discussion of her treatment options based on the molecular studies. For smoking cessation I strongly encouraged the patient to quit smoking. For the COPD she will continue her current treatment under the care of Dr. Elsworth Soho. I gave the patient and her family the time to ask questions and I answered them completely to their satisfaction. The patient was advised to call immediately if she has any concerning symptoms in the interval.  The patient voices understanding of current disease status and treatment options and is in agreement with the current care plan.  All questions were answered. The patient knows to call the clinic with any problems, questions or concerns. We can certainly see the patient much sooner if necessary.  Thank you so much for allowing me to participate in the care of INESSA WARDROP. I will continue to follow up the patient with you and assist in her care.  The total time spent in the appointment was 80 minutes.  Disclaimer: This note was dictated with voice recognition software. Similar sounding words can inadvertently be transcribed and may not be corrected upon review.   Eilleen Kempf January 20, 2020, 2:54 PM

## 2020-01-22 ENCOUNTER — Encounter: Payer: Self-pay | Admitting: *Deleted

## 2020-01-22 ENCOUNTER — Other Ambulatory Visit: Payer: Self-pay | Admitting: *Deleted

## 2020-01-22 ENCOUNTER — Telehealth: Payer: Self-pay | Admitting: Internal Medicine

## 2020-01-22 DIAGNOSIS — R918 Other nonspecific abnormal finding of lung field: Secondary | ICD-10-CM

## 2020-01-22 NOTE — Telephone Encounter (Signed)
Scheduled appt per 7/6 los - pt daughter in law aware of appt d/t

## 2020-01-22 NOTE — Progress Notes (Signed)
The proposed treatment discussed in cancer conference 01/22/20 is for discussion purpose only and is not a binding recommendation.  The patient was not physically seen nor present for their treatment option.  Therefore, final treatment plans cannot be decided.

## 2020-01-27 ENCOUNTER — Telehealth: Payer: Self-pay | Admitting: *Deleted

## 2020-01-27 NOTE — Telephone Encounter (Signed)
I followed up on Kayla Sawyer MR Brain scan.  This scan is authorized but not scheduled. I called Kayla Sawyer to help get scheduled but was unable to reach. I did leave vm message with my name and phone number to call.

## 2020-01-28 ENCOUNTER — Ambulatory Visit (HOSPITAL_COMMUNITY)
Admission: RE | Admit: 2020-01-28 | Discharge: 2020-01-28 | Disposition: A | Discharge status: Home / Self Care | Payer: Medicare Other | Source: Ambulatory Visit | Attending: Internal Medicine | Admitting: Internal Medicine

## 2020-01-28 ENCOUNTER — Other Ambulatory Visit: Payer: Self-pay

## 2020-01-28 DIAGNOSIS — C3490 Malignant neoplasm of unspecified part of unspecified bronchus or lung: Secondary | ICD-10-CM | POA: Diagnosis not present

## 2020-01-28 DIAGNOSIS — I6782 Cerebral ischemia: Secondary | ICD-10-CM | POA: Diagnosis not present

## 2020-01-28 DIAGNOSIS — M2548 Effusion, other site: Secondary | ICD-10-CM | POA: Diagnosis not present

## 2020-01-28 DIAGNOSIS — R930 Abnormal findings on diagnostic imaging of skull and head, not elsewhere classified: Secondary | ICD-10-CM | POA: Diagnosis not present

## 2020-01-28 MED ORDER — GADOBUTROL 1 MMOL/ML IV SOLN
7.0000 mL | Freq: Once | INTRAVENOUS | Status: AC | PRN
  Administered 2020-01-28: 7 mL via INTRAVENOUS

## 2020-01-29 ENCOUNTER — Ambulatory Visit: Payer: Medicare Other | Admitting: Pulmonary Disease

## 2020-01-29 DIAGNOSIS — R040 Epistaxis: Secondary | ICD-10-CM | POA: Diagnosis not present

## 2020-02-02 ENCOUNTER — Encounter: Payer: Self-pay | Admitting: *Deleted

## 2020-02-02 DIAGNOSIS — C3491 Malignant neoplasm of unspecified part of right bronchus or lung: Secondary | ICD-10-CM | POA: Diagnosis not present

## 2020-02-02 NOTE — Progress Notes (Signed)
I followed up on moleculars and PDL 1 results.  Moleculars are pending with results on 7/23 and PDL 1 is completed. I will place results on Dr. Worthy Flank desk.

## 2020-02-05 ENCOUNTER — Other Ambulatory Visit: Payer: Self-pay

## 2020-02-05 ENCOUNTER — Encounter: Payer: Self-pay | Admitting: Internal Medicine

## 2020-02-05 ENCOUNTER — Inpatient Hospital Stay: Payer: Medicare Other | Admitting: Internal Medicine

## 2020-02-05 VITALS — BP 114/70 | HR 104 | Temp 97.7°F | Resp 17 | Ht 68.0 in | Wt 143.5 lb

## 2020-02-05 DIAGNOSIS — Z5112 Encounter for antineoplastic immunotherapy: Secondary | ICD-10-CM | POA: Insufficient documentation

## 2020-02-05 DIAGNOSIS — Z7189 Other specified counseling: Secondary | ICD-10-CM | POA: Insufficient documentation

## 2020-02-05 DIAGNOSIS — Z5111 Encounter for antineoplastic chemotherapy: Secondary | ICD-10-CM | POA: Diagnosis not present

## 2020-02-05 DIAGNOSIS — C3492 Malignant neoplasm of unspecified part of left bronchus or lung: Secondary | ICD-10-CM

## 2020-02-05 DIAGNOSIS — J449 Chronic obstructive pulmonary disease, unspecified: Secondary | ICD-10-CM | POA: Diagnosis not present

## 2020-02-05 DIAGNOSIS — C3412 Malignant neoplasm of upper lobe, left bronchus or lung: Secondary | ICD-10-CM | POA: Diagnosis not present

## 2020-02-05 MED ORDER — CYANOCOBALAMIN 1000 MCG/ML IJ SOLN
1000.0000 ug | Freq: Once | INTRAMUSCULAR | Status: AC
  Administered 2020-02-05: 1000 ug via INTRAMUSCULAR

## 2020-02-05 MED ORDER — FOLIC ACID 1 MG PO TABS
1.0000 mg | ORAL_TABLET | Freq: Every day | ORAL | 4 refills | Status: DC
Start: 2020-02-05 — End: 2020-05-13

## 2020-02-05 MED ORDER — LIDOCAINE-PRILOCAINE 2.5-2.5 % EX CREA
TOPICAL_CREAM | CUTANEOUS | 0 refills | Status: DC
Start: 2020-02-05 — End: 2020-02-06

## 2020-02-05 MED ORDER — PROCHLORPERAZINE MALEATE 10 MG PO TABS
10.0000 mg | ORAL_TABLET | Freq: Four times a day (QID) | ORAL | 0 refills | Status: DC | PRN
Start: 2020-02-05 — End: 2023-03-06

## 2020-02-05 MED ORDER — CYANOCOBALAMIN 1000 MCG/ML IJ SOLN
INTRAMUSCULAR | Status: AC
  Filled 2020-02-05: qty 1

## 2020-02-05 NOTE — Patient Instructions (Signed)
Steps to Quit Smoking Smoking tobacco is the leading cause of preventable death. It can affect almost every organ in the body. Smoking puts you and people around you at risk for many serious, long-lasting (chronic) diseases. Quitting smoking can be hard, but it is one of the best things that you can do for your health. It is never too late to quit. How do I get ready to quit? When you decide to quit smoking, make a plan to help you succeed. Before you quit:  Pick a date to quit. Set a date within the next 2 weeks to give you time to prepare.  Write down the reasons why you are quitting. Keep this list in places where you will see it often.  Tell your family, friends, and co-workers that you are quitting. Their support is important.  Talk with your doctor about the choices that may help you quit.  Find out if your health insurance will pay for these treatments.  Know the people, places, things, and activities that make you want to smoke (triggers). Avoid them. What first steps can I take to quit smoking?  Throw away all cigarettes at home, at work, and in your car.  Throw away the things that you use when you smoke, such as ashtrays and lighters.  Clean your car. Make sure to empty the ashtray.  Clean your home, including curtains and carpets. What can I do to help me quit smoking? Talk with your doctor about taking medicines and seeing a counselor at the same time. You are more likely to succeed when you do both.  If you are pregnant or breastfeeding, talk with your doctor about counseling or other ways to quit smoking. Do not take medicine to help you quit smoking unless your doctor tells you to do so. To quit smoking: Quit right away  Quit smoking totally, instead of slowly cutting back on how much you smoke over a period of time.  Go to counseling. You are more likely to quit if you go to counseling sessions regularly. Take medicine You may take medicines to help you quit. Some  medicines need a prescription, and some you can buy over-the-counter. Some medicines may contain a drug called nicotine to replace the nicotine in cigarettes. Medicines may:  Help you to stop having the desire to smoke (cravings).  Help to stop the problems that come when you stop smoking (withdrawal symptoms). Your doctor may ask you to use:  Nicotine patches, gum, or lozenges.  Nicotine inhalers or sprays.  Non-nicotine medicine that is taken by mouth. Find resources Find resources and other ways to help you quit smoking and remain smoke-free after you quit. These resources are most helpful when you use them often. They include:  Online chats with a counselor.  Phone quitlines.  Printed self-help materials.  Support groups or group counseling.  Text messaging programs.  Mobile phone apps. Use apps on your mobile phone or tablet that can help you stick to your quit plan. There are many free apps for mobile phones and tablets as well as websites. Examples include Quit Guide from the CDC and smokefree.gov  What things can I do to make it easier to quit?   Talk to your family and friends. Ask them to support and encourage you.  Call a phone quitline (1-800-QUIT-NOW), reach out to support groups, or work with a counselor.  Ask people who smoke to not smoke around you.  Avoid places that make you want to smoke,   such as: ? Bars. ? Parties. ? Smoke-break areas at work.  Spend time with people who do not smoke.  Lower the stress in your life. Stress can make you want to smoke. Try these things to help your stress: ? Getting regular exercise. ? Doing deep-breathing exercises. ? Doing yoga. ? Meditating. ? Doing a body scan. To do this, close your eyes, focus on one area of your body at a time from head to toe. Notice which parts of your body are tense. Try to relax the muscles in those areas. How will I feel when I quit smoking? Day 1 to 3 weeks Within the first 24 hours,  you may start to have some problems that come from quitting tobacco. These problems are very bad 2-3 days after you quit, but they do not often last for more than 2-3 weeks. You may get these symptoms:  Mood swings.  Feeling restless, nervous, angry, or annoyed.  Trouble concentrating.  Dizziness.  Strong desire for high-sugar foods and nicotine.  Weight gain.  Trouble pooping (constipation).  Feeling like you may vomit (nausea).  Coughing or a sore throat.  Changes in how the medicines that you take for other issues work in your body.  Depression.  Trouble sleeping (insomnia). Week 3 and afterward After the first 2-3 weeks of quitting, you may start to notice more positive results, such as:  Better sense of smell and taste.  Less coughing and sore throat.  Slower heart rate.  Lower blood pressure.  Clearer skin.  Better breathing.  Fewer sick days. Quitting smoking can be hard. Do not give up if you fail the first time. Some people need to try a few times before they succeed. Do your best to stick to your quit plan, and talk with your doctor if you have any questions or concerns. Summary  Smoking tobacco is the leading cause of preventable death. Quitting smoking can be hard, but it is one of the best things that you can do for your health.  When you decide to quit smoking, make a plan to help you succeed.  Quit smoking right away, not slowly over a period of time.  When you start quitting, seek help from your doctor, family, or friends. This information is not intended to replace advice given to you by your health care provider. Make sure you discuss any questions you have with your health care provider. Document Revised: 03/28/2019 Document Reviewed: 09/21/2018 Elsevier Patient Education  2020 [employer]  

## 2020-02-05 NOTE — Progress Notes (Signed)
START ON PATHWAY REGIMEN - Non-Small Cell Lung     A cycle is every 21 days:     Pembrolizumab      Pemetrexed      Carboplatin   **Always confirm dose/schedule in your pharmacy ordering system**  Patient Characteristics: Stage IV Metastatic, Nonsquamous, Initial Chemotherapy/Immunotherapy, PS = 0, 1, ALK Rearrangement Negative and ROS1 Rearrangement Negative and NTRK Gene Fusion?Negative and RET Gene Fusion?Negative and EGFR Mutation Negative/Non?Sensitizing, PD-L1  Expression Positive  ? 50% (TPS) and Immunotherapy Candidate Therapeutic Status: Stage IV Metastatic Histology: Nonsquamous Cell ROS1 Rearrangement Status: Negative Other Mutations/Biomarkers: No Other Actionable Mutations NTRK Gene Fusion Status: Negative PD-L1 Expression Status: PD-L1 Positive ? 50% (TPS) Chemotherapy/Immunotherapy LOT: Initial Chemotherapy/Immunotherapy Molecular Targeted Therapy: Not Appropriate MET Exon 14 Mutation Status: Negative RET Gene Fusion Status: Negative ALK Rearrangement Status: Negative EGFR Mutation Status: Negative/Wild Type BRAF V600E Mutation Status: Negative ECOG Performance Status: 1 Biomarker Assessment Status Confirmation: All Genomic Markers Negative or Only MET+ or BRAF+ Immunotherapy Candidate Status: Candidate for Immunotherapy Intent of Therapy: Non-Curative / Palliative Intent, Discussed with Patient

## 2020-02-05 NOTE — Progress Notes (Signed)
[facility] [phone]   [phone]  OFFICE PROGRESS NOTE  Celene Squibb, MD [address] [place] [address]  DIAGNOSIS: Stage IV (T3, N2, M1a) non-small cell lung cancer, poorly differentiated adenocarcinoma presented with large left upper lobe lung mass in addition to mediastinal lymphadenopathy and bilateral pulmonary nodules diagnosed in June 2021.  Biomarker Findings Tumor Mutational Burden - 10 Muts/Mb Microsatellite status - MS-Stable Genomic Findings For a complete list of the genes assayed, please refer to the Appendix. NF1 Q2582* KRAS G12V CTNNB1 A640f*10 TP53 H179N 7 Disease relevant genes with no reportable alterations: ALK, BRAF, EGFR, ERBB2, MET, RET, ROS1  PDL1 Expression: 100%  PRIOR THERAPY: None  CURRENT THERAPY: Systemic chemotherapy with carboplatin for AUC of 5, Alimta 500 mg/M2 and Keytruda 200 mg IV every 3 weeks.  First dose 02/12/2020.  INTERVAL HISTORY: RKHADIJATOU BORAK89y.o. female returns to the clinic today for follow-up visit accompanied by her husband.  Her son JRoselyn Reefwas available by FaceTime during the visit.  The patient is feeling fine today with no concerning complaints except for mild pain in the left chest wall in addition to the baseline shortness of breath and she is currently on home oxygen.  She has also dry cough with no hemoptysis.  She denied having any nausea, vomiting, diarrhea or constipation.  She denied having any headache or visual changes.  The final pathology came back consistent with non-small cell lung cancer, adenocarcinoma.  The patient also had MRI of the brain that was negative for malignancy.  She had molecular studies that showed no actionable mutation but she had PD-L1 expression of 100%.  She is here today for evaluation and discussion of her treatment options.  MEDICAL HISTORY: Past Medical History:  Diagnosis Date  . Arthritis   . COPD (chronic obstructive pulmonary  disease) ([place]   . Depression   . E. coli pyelonephritis 6//2014  . GERD (gastroesophageal reflux disease)   . Pneumonia   . Pulmonary hypertension ([place]   . Sepsis ([facility]     ALLERGIES:  is allergic to sulfur.  MEDICATIONS:  Current Outpatient Medications  Medication Sig Dispense Refill  . acetaminophen (TYLENOL) 500 MG tablet Take 500 mg by mouth every 6 (six) hours as needed for mild pain.    .Marland Kitchenalbuterol (PROVENTIL) (2.5 MG/3ML) 0.083% nebulizer solution Take 3 mLs (2.5 mg total) by nebulization every 6 (six) hours as needed for wheezing or shortness of breath. 75 mL 12  . aspirin EC 81 MG tablet Take 1 tablet (81 mg total) by mouth daily.    .Marland Kitchenatorvastatin (LIPITOR) 40 MG tablet Take 40 mg by mouth daily.    .Marland Kitchenlevocetirizine (XYZAL) 5 MG tablet Take 5 mg by mouth every evening.     . Multiple Vitamin (MULTIVITAMIN WITH MINERALS) TABS tablet Take 1 tablet by mouth daily.    .Marland Kitchenomeprazole (PRILOSEC) 20 MG capsule Take 20 mg by mouth daily.     . sertraline (ZOLOFT) 100 MG tablet Take 100 mg by mouth daily.     . TRELEGY ELLIPTA 100-62.5-25 MCG/INH AEPB Inhale 1 puff into the lungs daily.     No current facility-administered medications for this visit.    SURGICAL HISTORY:  Past Surgical History:  Procedure Laterality Date  . ABDOMINAL HYSTERECTOMY     partial  . AORTA - BILATERAL FEMORAL ARTERY BYPASS GRAFT Bilateral 06/14/2016   Procedure: AORTOBIFEMORAL BYPASS GRAFT AORTA SMA BYPASS GRAFT  RE-IMPLANTATION  OF SUPERIOR MESENTERIC ARTERY;  Surgeon: Serafina Mitchell, MD;  Location: [facility];  Service: Vascular;  Laterality: Bilateral;  . BACK SURGERY     L4/5  . CARDIAC CATHETERIZATION N/A 01/03/2016   Procedure: Right/Left Heart Cath and Coronary Angiography;  Surgeon: Jolaine Artist, MD;  Location: [facility];  Service: Cardiovascular;  Laterality: N/A;  . COLONOSCOPY N/A 08/05/2015   Procedure: COLONOSCOPY;  Surgeon: Rogene Houston, MD;  [facility];   Service: Endoscopy;  Laterality: N/A;  730  . ELBOW SURGERY    . PERIPHERAL VASCULAR CATHETERIZATION N/A 02/16/2016   Procedure: Abdominal Aortogram w/Lower Extremity;  Surgeon: Wellington Hampshire, MD;  Location: [facility];  Service: Cardiovascular;  Laterality: N/A;    REVIEW OF SYSTEMS:  Constitutional: positive for fatigue Eyes: negative Ears, nose, mouth, throat, and face: negative Respiratory: positive for cough, dyspnea on exertion and pleurisy/chest pain Cardiovascular: negative Gastrointestinal: negative Genitourinary:negative Integument/breast: negative Hematologic/lymphatic: negative Musculoskeletal:negative Neurological: negative Behavioral/Psych: negative Endocrine: negative Allergic/Immunologic: negative   PHYSICAL EXAMINATION: General appearance: alert, cooperative, fatigued and no distress Head: Normocephalic, without obvious abnormality, atraumatic Neck: no adenopathy, no JVD, supple, symmetrical, trachea midline and thyroid not enlarged, symmetric, no tenderness/mass/nodules Lymph nodes: Cervical, supraclavicular, and axillary nodes normal. Resp: clear to auscultation bilaterally Back: symmetric, no curvature. ROM normal. No CVA tenderness. Cardio: regular rate and rhythm, S1, S2 normal, no murmur, click, rub or gallop GI: soft, non-tender; bowel sounds normal; no masses,  no organomegaly Extremities: extremities normal, atraumatic, no cyanosis or edema Neurologic: Alert and oriented X 3, normal strength and tone. Normal symmetric reflexes. Normal coordination and gait  ECOG PERFORMANCE STATUS: 1 - Symptomatic but completely ambulatory  Blood pressure 114/70, pulse 104, temperature 97.7 F (36.5 C), temperature source Temporal, resp. rate 17, height '5\' 8"'$  (1.727 m), weight 143 lb 8 oz (65.1 kg), SpO2 96 %.  LABORATORY DATA: Lab Results  Component Value Date   WBC 9.3 01/20/2020   HGB 12.3 01/20/2020   HCT 38.1 01/20/2020   MCV 88.4 01/20/2020   PLT  303 01/20/2020      Chemistry      Component Value Date/Time   NA 139 01/20/2020 1342   K 4.0 01/20/2020 1342   CL 105 01/20/2020 1342   CO2 25 01/20/2020 1342   BUN 17 01/20/2020 1342   CREATININE 1.03 (H) 01/20/2020 1342   CREATININE 0.90 02/08/2016 0949      Component Value Date/Time   CALCIUM 9.1 01/20/2020 1342   ALKPHOS 62 01/20/2020 1342   AST 11 (L) 01/20/2020 1342   ALT 8 01/20/2020 1342   BILITOT 0.4 01/20/2020 1342       RADIOGRAPHIC STUDIES: MR BRAIN W WO CONTRAST  Result Date: 01/29/2020 CLINICAL DATA:  Non-small cell lung cancer, staging. EXAM: MRI HEAD WITHOUT AND WITH CONTRAST TECHNIQUE: Multiplanar, multiecho pulse sequences of the brain and surrounding structures were obtained without and with intravenous contrast. CONTRAST:  29m GADAVIST GADOBUTROL 1 MMOL/ML IV SOLN COMPARISON:  08/16/2016 head CT.  07/07/2011 MRI head. FINDINGS: Brain: Scattered and confluent T2/FLAIR hyperintense foci involving the periventricular and deep white matter are nonspecific however commonly associated with chronic microvascular ischemic changes. No acute infarct or intracranial hemorrhage. No midline shift, ventriculomegaly or extra-axial fluid collection. No mass lesion. No abnormal enhancement. Vascular: Normal flow voids. Skull and upper cervical spine: Normal marrow signal. Sinuses/Orbits: Normal orbits. Clear paranasal sinuses. Small right mastoid effusion. Other: Nasopharyngeal mucous retention cysts. IMPRESSION: No  evidence of intracranial metastases. Mild chronic microvascular ischemic changes. Electronically Signed   By: Primitivo Gauze M.D.   On: 01/29/2020 10:21   CT BIOPSY  Result Date: 01/12/2020 INDICATION: 74 year old female with a history of left upper lobe lung mass EXAM: CT BIOPSY MEDICATIONS: None. ANESTHESIA/SEDATION: Moderate (conscious) sedation was employed during this procedure. A total of Versed 1.0 mg and Fentanyl 50 mcg was administered intravenously.  Moderate Sedation Time: 20 minutes. The patient's level of consciousness and vital signs were monitored continuously by radiology nursing throughout the procedure under my direct supervision. FLUOROSCOPY TIME:  CT COMPLICATIONS: None PROCEDURE: The procedure, risks, benefits, and alternatives were explained to the patient and the patient's family. Specific risks that were addressed included bleeding, infection, pneumothorax, need for further procedure including chest tube placement, chance of delayed pneumothorax or hemorrhage, hemoptysis, nondiagnostic sample, cardiopulmonary collapse, death. Questions regarding the procedure were encouraged and answered. The patient understands and consents to the procedure. Patient was positioned in the supine position on the CT gantry table and a scout CT of the chest was performed for planning purposes. Once angle of approach was determined, the skin and subcutaneous tissues this scan was prepped and draped in the usual sterile fashion, and a sterile drape was applied covering the operative field. A sterile gown and sterile gloves were used for the procedure. Local anesthesia was provided with 1% Lidocaine. The skin and subcutaneous tissues were infiltrated 1% lidocaine for local anesthesia, and a small stab incision was made with an 11 blade scalpel. Using CT guidance, a 17 gauge trocar needle was advanced into the left upper lobetarget. After confirmation of the tip, separate 18 gauge core biopsies were performed. These were placed into solution for transportation to the lab. Biosentry Device was deployed. A final CT image was performed. Patient tolerated the procedure well and remained hemodynamically stable throughout. No complications were encountered and no significant blood loss was encounter IMPRESSION: Status post CT-guided biopsy of left upper lobe lung mass. Tissue specimen sent to pathology for complete histopathologic analysis. Signed, Dulcy Fanny. Dellia Nims, RPVI  [facility] Electronically Signed   By: Corrie Mckusick D.O.   On: 01/12/2020 12:38   DG Chest Port 1 View  Result Date: 01/12/2020 CLINICAL DATA:  Status post needle biopsy of left lung mass. EXAM: PORTABLE CHEST 1 VIEW COMPARISON:  09/17/2017 FINDINGS: Heart size is normal. Mild tortuosity of thoracic aorta is again seen. A probable small approximately 10% left apical pneumothorax is seen. Confluency opacity is also seen in the left upper lobe. Pulmonary emphysema again noted. IMPRESSION: 1. Probable small approximately 10% left apical pneumothorax. 2. Left upper lobe airspace opacity. 3. Emphysema. Electronically Signed   By: Marlaine Hind M.D.   On: 01/12/2020 13:16    ASSESSMENT AND PLAN: This is a very pleasant 74 years old white female with a stage IV (T3, N2, M1a) non-small cell lung cancer, poorly differentiated adenocarcinoma diagnosed in June 2021 and presented with large left upper lobe lung mass with mediastinal invasion in addition to mediastinal lymphadenopathy as well as bilateral pulmonary nodules. The patient has no actionable mutations but PD-L1 expression is 100%. I recommended for the patient to keep her appointment with Dr. Sondra Come for consideration of palliative radiotherapy to the left upper lobe lung mass. I had a lengthy discussion with the patient about her current condition and treatment options.  She understands that she has incurable condition and all the treatment will be of palliative nature.  I gave the patient the option of palliative care and hospice referral versus palliative systemic chemotherapy with single agent immunotherapy with pembrolizumab versus a combination of systemic chemotherapy with carboplatin, pemetrexed and pembrolizumab. Because of the bulky disease I recommended for the patient to consider the combination chemotherapy.  She is interested in this option. She will be treated with carboplatin for  AUC of 5, pemetrexed 500 mg/M2 and pembrolizumab 200 mg IV every 3 weeks. I discussed with the patient the adverse effect of this treatment including but not limited to alopecia, myelosuppression, nausea and vomiting, peripheral neuropathy, liver or renal dysfunction in addition to the immunotherapy adverse effects. The patient will have a chemotherapy education class before the first dose of her treatment. I will refer the patient to interventional radiology for Port-A-Cath placement. She will receive vitamin B12 injection today. I will call her pharmacy with prescription for folic acid 1 mg p.o. daily in addition to Compazine 10 mg p.o. every 6 hours as needed for nausea and EMLA cream to be applied to the Port-A-Cath site before treatment. The patient will come back for follow-up visit 1 week after the start of her treatment for evaluation and management of any adverse effect of her treatment. The patient was advised to call immediately if she has any other concerning symptoms in the interval. The patient voices understanding of current disease status and treatment options and is in agreement with the current care plan.  All questions were answered. The patient knows to call the clinic with any problems, questions or concerns. We can certainly see the patient much sooner if necessary.  The total time spent in the appointment was 55 minutes.  Disclaimer: This note was dictated with voice recognition software. Similar sounding words can inadvertently be transcribed and may not be corrected upon review.

## 2020-02-06 ENCOUNTER — Other Ambulatory Visit: Payer: Self-pay | Admitting: *Deleted

## 2020-02-06 MED ORDER — LIDOCAINE-PRILOCAINE 2.5-2.5 % EX CREA: TOPICAL_CREAM | CUTANEOUS | 0 refills | Status: DC

## 2020-02-08 DIAGNOSIS — J432 Centrilobular emphysema: Secondary | ICD-10-CM | POA: Diagnosis not present

## 2020-02-08 DIAGNOSIS — J449 Chronic obstructive pulmonary disease, unspecified: Secondary | ICD-10-CM | POA: Diagnosis not present

## 2020-02-08 DIAGNOSIS — J849 Interstitial pulmonary disease, unspecified: Secondary | ICD-10-CM | POA: Diagnosis not present

## 2020-02-09 NOTE — Progress Notes (Signed)
Radiation Oncology         [phone] ________________________________  Initial Outpatient Consultation  Name: Kayla Sawyer MRN: [id number]  Date: 02/11/2020  DOB: Aug 26, 1945  XN:ATFT, Edwinna Areola, MD  Curt Bears, MD   REFERRING PHYSICIAN: Curt Bears, MD  DIAGNOSIS: The encounter diagnosis was Adenocarcinoma of left lung, stage 4 ([place]).  Stage IV (T3, N2,M1a)non-small cell left upper lobe lung cancer, poorly differentiated adenocarcinoma with mediastinal lymphadenopathy and bilateral pulmonary nodules  HISTORY OF PRESENT ILLNESS::Kayla Sawyer is a 74 y.o. female who is seen as a courtesy of Dr. Julien Nordmann for an opinion concerning radiation therapy as part of management for her recently diagnosed lung cancer. The patient presented to Dr. Elsworth Soho on 12/09/2019 to establish care for COPD and chronic hypoxic respiratory failure. CTA of chest was ordered and performed on 12/18/2019. Results showed a dense consolidation in the left upper lobe that involved the apex and lateral pleural surface with differentials including bronchogenic carcinoma versus lobar pneumonia. With the scattered nodules within the left lower lobe and the right lung findings, it was concerning for bronchogenic carcinoma. Additionally, there was noted to be left hilar adenopathy and a non-pathologic enlarged left supraclavicular node.  PET scan on 01/02/2020 showed findings most consistent with metastatic left upper lobe primary bronchogenic carcinoma with metastasis to bilateral lungs and thoracic nodes. No hypermetabolic extrathoracic metastasis was identified. There was also some relative right vocal cord hypermetabolism without a well-defined mass, which can be seen with left recurrent laryngeal nerve paralysis. Finally, there was presumably reactive hypermetabolism along the course of the aorta/bifem bypass.  The patient underwent a CT-guided biopsy of the left apical lung mass on 01/12/2020 that was performed by Dr.  Earleen Newport. Pathology from the procedure revealed poorly differentiated adenocarcinoma.  The patient was referred to Dr. Julien Nordmann and was seen in consultation on 01/20/2020. At that time, it was recommended that she proceed with brain MRI followed by treatment, with options including palliative care and hospice referral versus consideration of palliative systemic chemotherapy with a combination of Carboplatin, Paclitaxel, and Keytruda (if molecular studies do not show an actionable mutation) versus treatment with targeted therapy (if molecular studies do show an actionable mutation). Molecular studies did not show an actionable mutation but she did have a PD-L1 expression of 100%.  MRI of brain on 01/28/2020 showed mild chronic microvascular ischemic changes without evidence of intracranial metastases.  The patient was last seen by Dr. Julien Nordmann on 02/05/2020, during which time it was recommended that the patient proceed with combination chemotherapy. She will begin treatment with Carboplatin, Pemetrexed, and Pembrolizumab on 02/12/2020.  PREVIOUS RADIATION THERAPY: No  PAST MEDICAL HISTORY:  Past Medical History:  Diagnosis Date  . Arthritis   . COPD (chronic obstructive pulmonary disease) ([place])   . Depression   . E. coli pyelonephritis 6//2014  . GERD (gastroesophageal reflux disease)   . Pneumonia   . Pulmonary hypertension ([place])   . Sepsis ([place])     PAST SURGICAL HISTORY: Past Surgical History:  Procedure Laterality Date  . ABDOMINAL HYSTERECTOMY     partial  . AORTA - BILATERAL FEMORAL ARTERY BYPASS GRAFT Bilateral 06/14/2016   Procedure: AORTOBIFEMORAL BYPASS GRAFT AORTA SMA BYPASS GRAFT RE-IMPLANTATION  OF SUPERIOR MESENTERIC ARTERY;  Surgeon: Serafina Mitchell, MD;  Location: [place];  Service: Vascular;  Laterality: Bilateral;  . BACK SURGERY     L4/5  . CARDIAC CATHETERIZATION N/A 01/03/2016   Procedure: Right/Left Heart Cath and Coronary Angiography;  Surgeon: Shaune Pascal  Bensimhon, MD;   Location: [place] [facility];  Service: Cardiovascular;  Laterality: N/A;  . COLONOSCOPY N/A 08/05/2015   Procedure: COLONOSCOPY;  Surgeon: Rogene Houston, MD;  [facility];  Service: Endoscopy;  Laterality: N/A;  730  . ELBOW SURGERY    . PERIPHERAL VASCULAR CATHETERIZATION N/A 02/16/2016   Procedure: Abdominal Aortogram w/Lower Extremity;  Surgeon: Wellington Hampshire, MD;  Location: [place] [facility];  Service: Cardiovascular;  Laterality: N/A;    FAMILY HISTORY:  Family History  Problem Relation Age of Onset  . Diabetes Mother   . Emphysema Father     SOCIAL HISTORY:  Social History   Tobacco Use  . Smoking status: Current Every Day Smoker    Packs/day: 0.50    Years: 50.00    Pack years: 25.00    Types: Cigarettes  . Smokeless tobacco: Never Used  . Tobacco comment: 1/2-3/4 ppd 1.29.18  Vaping Use  . Vaping Use: Never used  Substance Use Topics  . Alcohol use: No    Alcohol/week: 0.0 standard drinks  . Drug use: No    ALLERGIES:  Allergies  Allergen Reactions  . Sulfur Nausea Only    MEDICATIONS:  Current Outpatient Medications  Medication Sig Dispense Refill  . acetaminophen (TYLENOL) 500 MG tablet Take 500 mg by mouth every 6 (six) hours as needed for mild pain.    Marland Kitchen albuterol (PROVENTIL) (2.5 MG/3ML) 0.083% nebulizer solution Take 3 mLs (2.5 mg total) by nebulization every 6 (six) hours as needed for wheezing or shortness of breath. 75 mL 12  . aspirin EC 81 MG tablet Take 1 tablet (81 mg total) by mouth daily. (Patient not taking: Reported on 02/11/2020)    . atorvastatin (LIPITOR) 40 MG tablet Take 40 mg by mouth daily.    . folic acid (FOLVITE) 1 MG tablet Take 1 tablet (1 mg total) by mouth daily. 30 tablet 4  . levocetirizine (XYZAL) 5 MG tablet Take 5 mg by mouth every evening.     . lidocaine-prilocaine (EMLA) cream Apply to the Port-A-Cath site 30-60-minute before chemotherapy every 3 weeks. 30 g 0  . Multiple Vitamin (MULTIVITAMIN WITH  MINERALS) TABS tablet Take 1 tablet by mouth daily.    Marland Kitchen omeprazole (PRILOSEC) 20 MG capsule Take 20 mg by mouth daily.     . prochlorperazine (COMPAZINE) 10 MG tablet Take 1 tablet (10 mg total) by mouth every 6 (six) hours as needed for nausea or vomiting. 30 tablet 0  . sertraline (ZOLOFT) 100 MG tablet Take 100 mg by mouth daily.     . TRELEGY ELLIPTA 100-62.5-25 MCG/INH AEPB Inhale 1 puff into the lungs daily.     No current facility-administered medications for this encounter.    REVIEW OF SYSTEMS:  A 10+ POINT REVIEW OF SYSTEMS WAS OBTAINED including neurology, dermatology, psychiatry, cardiac, respiratory, lymph, extremities, GI, GU, musculoskeletal, constitutional, reproductive, HEENT.  She denies any hemoptysis at this time.  She denies any upper chest wall pain   PHYSICAL EXAM:  height is 5' 8" (1.727 m) and weight is 143 lb 8 oz (65.1 kg). Her oral temperature is 98.1 F (36.7 C). Her blood pressure is 103/65 and her pulse is 91. Her respiration is 18 and oxygen saturation is 97%.   General: Alert and oriented, in no acute distress, remains in a wheelchair for evaluation.  Has supplemental oxygen in place 3 L HEENT: Head is normocephalic. Extraocular movements are intact. Oropharynx is clear.  Poor dentition with  several teeth missing Neck: Neck is supple, no palpable cervical or supraclavicular lymphadenopathy. Heart: Regular in rate and rhythm with no murmurs, rubs, or gallops. Chest: Clear to auscultation bilaterally, with no rhonchi, wheezes, or rales.  No tenderness with palpation Abdomen: Soft, nontender, nondistended, with no rigidity or guarding. Extremities: No cyanosis or edema. Lymphatics: see Neck Exam Skin: No concerning lesions. Musculoskeletal: symmetric strength and muscle tone throughout. Neurologic: Cranial nerves II through XII are grossly intact. No obvious focalities. Speech is fluent. Coordination is intact. Psychiatric: Judgment and insight are intact.  Affect is appropriate.   ECOG = 2  0 - Asymptomatic (Fully active, able to carry on all predisease activities without restriction)  1 - Symptomatic but completely ambulatory (Restricted in physically strenuous activity but ambulatory and able to carry out work of a light or sedentary nature. For example, light housework, office work)  2 - Symptomatic, <50% in bed during the day (Ambulatory and capable of all self care but unable to carry out any work activities. Up and about more than 50% of waking hours)  3 - Symptomatic, >50% in bed, but not bedbound (Capable of only limited self-care, confined to bed or chair 50% or more of waking hours)  4 - Bedbound (Completely disabled. Cannot carry on any self-care. Totally confined to bed or chair)  5 - Death   Eustace Pen MM, Creech RH, Tormey DC, et al. [id number]). "Toxicity and response criteria of the [facility]". [place] Oncol. 5 (6): 649-55  LABORATORY DATA:  Lab Results  Component Value Date   WBC 9.3 01/20/2020   HGB 12.3 01/20/2020   HCT 38.1 01/20/2020   MCV 88.4 01/20/2020   PLT 303 01/20/2020   NEUTROABS 6.5 01/20/2020   Lab Results  Component Value Date   NA 139 01/20/2020   K 4.0 01/20/2020   CL 105 01/20/2020   CO2 25 01/20/2020   GLUCOSE 88 01/20/2020   CREATININE 1.03 (H) 01/20/2020   CALCIUM 9.1 01/20/2020      RADIOGRAPHY: MR BRAIN W WO CONTRAST  Result Date: 01/29/2020 CLINICAL DATA:  Non-small cell lung cancer, staging. EXAM: MRI HEAD WITHOUT AND WITH CONTRAST TECHNIQUE: Multiplanar, multiecho pulse sequences of the brain and surrounding structures were obtained without and with intravenous contrast. CONTRAST:  2m GADAVIST GADOBUTROL 1 MMOL/ML IV SOLN COMPARISON:  08/16/2016 head CT.  07/07/2011 MRI head. FINDINGS: Brain: Scattered and confluent T2/FLAIR hyperintense foci involving the periventricular and deep white matter are nonspecific however commonly associated with chronic microvascular  ischemic changes. No acute infarct or intracranial hemorrhage. No midline shift, ventriculomegaly or extra-axial fluid collection. No mass lesion. No abnormal enhancement. Vascular: Normal flow voids. Skull and upper cervical spine: Normal marrow signal. Sinuses/Orbits: Normal orbits. Clear paranasal sinuses. Small right mastoid effusion. Other: Nasopharyngeal mucous retention cysts. IMPRESSION: No evidence of intracranial metastases. Mild chronic microvascular ischemic changes. Electronically Signed   By: CPrimitivo GauzeM.D.   On: 01/29/2020 10:21      IMPRESSION: Stage IV (T3, N2,M1a)non-small cell left upper lobe lung cancer, poorly differentiated adenocarcinoma with mediastinal lymphadenopathy and bilateral pulmonary nodules  Patient has a bulky left upper lung mass which is densely adherent to the chest wall.  With progression this could result in chest wall invasion and subsequent pain.  Given these findings and the bulk of the tumor I would recommend palliative radiation therapy as part of her overall treatment.  Today, I talked to the patient about the findings and work-up thus far.  We discussed the natural history of adenocarcinoma and general treatment, highlighting the role of radiotherapy in the management.  We discussed the available radiation techniques, and focused on the details of logistics and delivery.  We reviewed the anticipated acute and late sequelae associated with radiation in this setting.  The patient was encouraged to ask questions that I answered to the best of my ability.  A patient consent form was discussed and signed.  We retained a copy for our records.  The patient would like to proceed with radiation and will be scheduled for CT simulation.  PLAN: Patient will return tomorrow for CT simulation with treatments to begin early next week.  Anticipate 3 weeks of radiation therapy as part of her overall management.  Total time spent in this encounter was 65 minutes  which included reviewing the patient's most recent consultations, follow-ups, CT scan, PET scan, MRI of brain, physical examination, and documentation.   ------------------------------------------------  Blair Promise, PhD, MD  This document serves as a record of services personally performed by Gery Pray, MD. It was created on his behalf by Clerance Lav, a trained medical scribe. The creation of this record is based on the scribe's personal observations and the provider's statements to them. This document has been checked and approved by the attending provider.

## 2020-02-11 ENCOUNTER — Other Ambulatory Visit: Payer: Self-pay

## 2020-02-11 ENCOUNTER — Encounter: Payer: Self-pay | Admitting: Radiation Oncology

## 2020-02-11 ENCOUNTER — Ambulatory Visit
Admission: RE | Admit: 2020-02-11 | Discharge: 2020-02-11 | Disposition: A | Discharge status: Home / Self Care | Payer: Medicare Other | Source: Ambulatory Visit | Attending: Radiation Oncology | Admitting: Radiation Oncology

## 2020-02-11 DIAGNOSIS — M129 Arthropathy, unspecified: Secondary | ICD-10-CM | POA: Insufficient documentation

## 2020-02-11 DIAGNOSIS — J449 Chronic obstructive pulmonary disease, unspecified: Secondary | ICD-10-CM | POA: Diagnosis not present

## 2020-02-11 DIAGNOSIS — Z7982 Long term (current) use of aspirin: Secondary | ICD-10-CM | POA: Insufficient documentation

## 2020-02-11 DIAGNOSIS — K219 Gastro-esophageal reflux disease without esophagitis: Secondary | ICD-10-CM | POA: Diagnosis not present

## 2020-02-11 DIAGNOSIS — J9611 Chronic respiratory failure with hypoxia: Secondary | ICD-10-CM | POA: Insufficient documentation

## 2020-02-11 DIAGNOSIS — F329 Major depressive disorder, single episode, unspecified: Secondary | ICD-10-CM | POA: Insufficient documentation

## 2020-02-11 DIAGNOSIS — R590 Localized enlarged lymph nodes: Secondary | ICD-10-CM | POA: Insufficient documentation

## 2020-02-11 DIAGNOSIS — R599 Enlarged lymph nodes, unspecified: Secondary | ICD-10-CM | POA: Diagnosis not present

## 2020-02-11 DIAGNOSIS — C3492 Malignant neoplasm of unspecified part of left bronchus or lung: Secondary | ICD-10-CM

## 2020-02-11 DIAGNOSIS — Z79899 Other long term (current) drug therapy: Secondary | ICD-10-CM | POA: Diagnosis not present

## 2020-02-11 DIAGNOSIS — R918 Other nonspecific abnormal finding of lung field: Secondary | ICD-10-CM | POA: Insufficient documentation

## 2020-02-11 DIAGNOSIS — I272 Pulmonary hypertension, unspecified: Secondary | ICD-10-CM | POA: Diagnosis not present

## 2020-02-11 DIAGNOSIS — C3412 Malignant neoplasm of upper lobe, left bronchus or lung: Secondary | ICD-10-CM | POA: Insufficient documentation

## 2020-02-11 DIAGNOSIS — I6782 Cerebral ischemia: Secondary | ICD-10-CM | POA: Insufficient documentation

## 2020-02-11 DIAGNOSIS — F17210 Nicotine dependence, cigarettes, uncomplicated: Secondary | ICD-10-CM | POA: Diagnosis not present

## 2020-02-11 NOTE — Progress Notes (Signed)
°  Radiation Oncology         (336) [phone] ________________________________  Name: Kayla Sawyer MRN: [id number]  Date: 02/12/2020  DOB: [birth year]  SIMULATION AND TREATMENT PLANNING NOTE    ICD-10-CM   1. Adenocarcinoma of left lung, stage 4 ([facility])  C34.92     DIAGNOSIS: Stage IV (T3, N2,M1a)non-small cell left upper lobe lung cancer, poorly differentiated adenocarcinoma with mediastinal lymphadenopathy and bilateral pulmonary nodules  NARRATIVE:  The patient was brought to the [facility].  Identity was confirmed.  All relevant records and images related to the planned course of therapy were reviewed.  The patient freely provided informed written consent to proceed with treatment after reviewing the details related to the planned course of therapy. The consent form was witnessed and verified by the simulation staff.  Then, the patient was set-up in a stable reproducible  supine position for radiation therapy.  CT images were obtained.  Surface markings were placed.  The CT images were loaded into the planning software.  Then the target and avoidance structures were contoured.  Treatment planning then occurred.  The radiation prescription was entered and confirmed.  Then, I designed and supervised the construction of a total of 6 medically necessary complex treatment devices.  I have requested : 3D Simulation  I have requested a DVH of the following structures: heart, lungs, GTV, PTV, esophagus, spinal cord.  I have ordered:dose calc.  PLAN:  The patient will receive 35 Gy in 14 fractions directed at the large left lung mass  -----------------------------------  Blair Promise, PhD, MD  This document serves as a record of services personally performed by Gery Pray, MD. It was created on his behalf by Clerance Lav, a trained medical scribe. The creation of this record is based on the scribe's personal observations and the provider's statements to them. This document has been  checked and approved by the attending provider.

## 2020-02-11 NOTE — Progress Notes (Signed)
Patient here for a consult with Dr. Sondra Come.  me: Kayla Sawyer MRN: [id number]  Date: 02/11/2020  DOB: 1946/03/23  ZD:GLOV, Edwinna Areola, MD  Curt Bears, MD   REFERRING PHYSICIAN: Curt Bears, MD  DIAGNOSIS: There were no encounter diagnoses.  Stage IV (T3, N2,M1a)non-small cell left upper lobe lung cancer, poorly differentiated adenocarcinoma with mediastinal lymphadenopathy and bilateral pulmonary nodules  HISTORY OF PRESENT ILLNESS::Kayla Sawyer is a 74 y.o. female who is seen as a courtesy of Dr. Julien Nordmann for an opinion concerning radiation therapy as part of management for her recently diagnosed lung cancer. Today, she is accompanied by . The patient presented to Dr. Elsworth Soho on 12/09/2019 to establish care for COPD and chronic hypoxic respiratory failure. CTA of chest was ordered and performed on 12/18/2019. Results showed a dense consolidation in the left upper lobe that involved the apex and lateral pleural surface with differentials including bronchogenic carcinoma versus lobar pneumonia. With the scattered nodules within the left lower lobe and the right lung findings, it was concerning for bronchogenic carcinoma. Additionally, there was noted to be left hilar adenopathy and a non-pathologic enlarged left supraclavicular node.  PET scan on 01/02/2020 showed findings most consistent with metastatic left upper lobe primary bronchogenic carcinoma with metastasis to bilateral lungs and thoracic nodes. No hypermetabolic extrathoracic metastasis was identified. There was also some relative right vocal cord hypermetabolism without a well-defined mass, which can be seen with left recurrent laryngeal nerve paralysis. Finally, there was presumably reactive hypermetabolism along the course of the aorta/bifem bypass.  The patient underwent a CT-guided biopsy of the left apical lung mass on 01/12/2020 that was performed by Dr. Earleen Newport. Pathology from the procedure revealed poorly differentiated  adenocarcinoma.  The patient was referred to Dr. Julien Nordmann and was seen in consultation on 01/20/2020. At that time, it was recommended that she proceed with brain MRI followed by treatment, with options including palliative care and hospice referral versus consideration of palliative systemic chemotherapy with a combination of Carboplatin, Paclitaxel, and Keytruda (if molecular studies do not show an actionable mutation) versus treatment with targeted therapy (if molecular studies do show an actionable mutation). Molecular studies did not show an actionable mutation but she did have a PD-L1 expression of 100%.  MRI of brain on 01/28/2020 showed mild chronic microvascular ischemic changes without evidence of intracranial metastases.  The patient was last seen by Dr. Julien Nordmann on 02/05/2020, during which time it was recommended that the patient proceed with combination chemotherapy. She will begin treatment with Carboplatin, Pemetrexed, and Pembrolizumab on 02/12/2020.   Past/Anticipated interventions by cardiothoracic surgery, if any: no  Past/Anticipated interventions by medical oncology, if any: will start soon chemo an dImmunotherapy  Signs/Symptoms  Weight changes, if any: has lost about 5 lbs in the last month  Respiratory complaints: shortness of breath, has coughing upon arising.  Hemoptysis, if any: no  Pain issues, if any: no  SAFETY ISSUES:  Prior radiation? no  Pacemaker/ICD? no  Possible current pregnancy? hysterectomy  Is the patient on methotrexate? no  Current Complaints / other details:  Patient plans to quit smoking in 4 days.  BP 103/65 (BP Location: Left Arm, Patient Position: Sitting)   Pulse 91   Temp 98.1 F (36.7 C) (Oral)   Resp 18   Ht '5\' 8"'$  (1.727 m)   Wt 143 lb 8 oz (65.1 kg)   SpO2 97%   BMI 21.82 kg/m   Wt Readings from Last 3 Encounters:  02/11/20 143 lb 8 oz (  65.1 kg)  02/05/20 143 lb 8 oz (65.1 kg)  01/20/20 144 lb 1.6 oz (65.4 kg)

## 2020-02-12 ENCOUNTER — Ambulatory Visit
Admission: RE | Admit: 2020-02-12 | Discharge: 2020-02-12 | Disposition: A | Discharge status: Home / Self Care | Payer: Medicare Other | Source: Ambulatory Visit | Attending: Radiation Oncology | Admitting: Radiation Oncology

## 2020-02-12 ENCOUNTER — Other Ambulatory Visit: Payer: Self-pay

## 2020-02-12 ENCOUNTER — Other Ambulatory Visit: Payer: Self-pay | Admitting: Radiology

## 2020-02-12 DIAGNOSIS — C3492 Malignant neoplasm of unspecified part of left bronchus or lung: Secondary | ICD-10-CM

## 2020-02-12 DIAGNOSIS — C3412 Malignant neoplasm of upper lobe, left bronchus or lung: Secondary | ICD-10-CM | POA: Diagnosis not present

## 2020-02-13 ENCOUNTER — Other Ambulatory Visit: Payer: Self-pay | Admitting: Radiology

## 2020-02-14 DIAGNOSIS — J449 Chronic obstructive pulmonary disease, unspecified: Secondary | ICD-10-CM | POA: Diagnosis not present

## 2020-02-16 ENCOUNTER — Ambulatory Visit (HOSPITAL_COMMUNITY)
Admission: RE | Admit: 2020-02-16 | Discharge: 2020-02-16 | Disposition: A | Discharge status: Home / Self Care | Payer: Medicare Other | Source: Ambulatory Visit | Attending: Internal Medicine | Admitting: Internal Medicine

## 2020-02-16 ENCOUNTER — Other Ambulatory Visit: Payer: Self-pay | Admitting: Internal Medicine

## 2020-02-16 ENCOUNTER — Encounter (HOSPITAL_COMMUNITY): Payer: Self-pay

## 2020-02-16 ENCOUNTER — Other Ambulatory Visit: Payer: Self-pay

## 2020-02-16 DIAGNOSIS — C3492 Malignant neoplasm of unspecified part of left bronchus or lung: Secondary | ICD-10-CM

## 2020-02-16 DIAGNOSIS — K219 Gastro-esophageal reflux disease without esophagitis: Secondary | ICD-10-CM | POA: Insufficient documentation

## 2020-02-16 DIAGNOSIS — I272 Pulmonary hypertension, unspecified: Secondary | ICD-10-CM | POA: Insufficient documentation

## 2020-02-16 DIAGNOSIS — F329 Major depressive disorder, single episode, unspecified: Secondary | ICD-10-CM | POA: Diagnosis not present

## 2020-02-16 DIAGNOSIS — J449 Chronic obstructive pulmonary disease, unspecified: Secondary | ICD-10-CM | POA: Insufficient documentation

## 2020-02-16 DIAGNOSIS — Z79899 Other long term (current) drug therapy: Secondary | ICD-10-CM | POA: Diagnosis not present

## 2020-02-16 DIAGNOSIS — Z87891 Personal history of nicotine dependence: Secondary | ICD-10-CM | POA: Diagnosis not present

## 2020-02-16 DIAGNOSIS — Z7982 Long term (current) use of aspirin: Secondary | ICD-10-CM | POA: Diagnosis not present

## 2020-02-16 DIAGNOSIS — Z5111 Encounter for antineoplastic chemotherapy: Secondary | ICD-10-CM | POA: Diagnosis not present

## 2020-02-16 HISTORY — PX: IR IMAGING GUIDED PORT INSERTION: IMG5740

## 2020-02-16 LAB — CBC WITH DIFFERENTIAL/PLATELET
Abs Immature Granulocytes: 0.04 10*3/uL (ref 0.00–0.07)
Basophils Absolute: 0.2 10*3/uL — ABNORMAL HIGH (ref 0.0–0.1)
Basophils Relative: 1 %
Eosinophils Absolute: 0.5 10*3/uL (ref 0.0–0.5)
Eosinophils Relative: 5 %
HCT: 38 % (ref 36.0–46.0)
Hemoglobin: 12 g/dL (ref 12.0–15.0)
Immature Granulocytes: 0 %
Lymphocytes Relative: 21 %
Lymphs Abs: 2.2 10*3/uL (ref 0.7–4.0)
MCH: 28.8 pg (ref 26.0–34.0)
MCHC: 31.6 g/dL (ref 30.0–36.0)
MCV: 91.1 fL (ref 80.0–100.0)
Monocytes Absolute: 1 10*3/uL (ref 0.1–1.0)
Monocytes Relative: 9 %
Neutro Abs: 6.6 10*3/uL (ref 1.7–7.7)
Neutrophils Relative %: 64 %
Platelets: 315 10*3/uL (ref 150–400)
RBC: 4.17 MIL/uL (ref 3.87–5.11)
RDW: 14.9 % (ref 11.5–15.5)
WBC: 10.4 10*3/uL (ref 4.0–10.5)
nRBC: 0 % (ref 0.0–0.2)

## 2020-02-16 LAB — PROTIME-INR
INR: 1 (ref 0.8–1.2)
Prothrombin Time: 12.5 seconds (ref 11.4–15.2)

## 2020-02-16 MED ORDER — HEPARIN SOD (PORK) LOCK FLUSH 100 UNIT/ML IV SOLN
INTRAVENOUS | Status: AC
  Filled 2020-02-16: qty 5

## 2020-02-16 MED ORDER — CEFAZOLIN SODIUM-DEXTROSE 2-4 GM/100ML-% IV SOLN: 2.0000 g | INTRAVENOUS | Status: AC

## 2020-02-16 MED ORDER — FENTANYL CITRATE (PF) 100 MCG/2ML IJ SOLN
INTRAMUSCULAR | Status: AC
  Filled 2020-02-16: qty 2

## 2020-02-16 MED ORDER — FENTANYL CITRATE (PF) 100 MCG/2ML IJ SOLN
INTRAMUSCULAR | Status: AC | PRN
  Administered 2020-02-16: 25 ug via INTRAVENOUS
  Administered 2020-02-16: 50 ug via INTRAVENOUS

## 2020-02-16 MED ORDER — LIDOCAINE-EPINEPHRINE 1 %-1:100000 IJ SOLN
INTRAMUSCULAR | Status: AC
  Filled 2020-02-16: qty 1

## 2020-02-16 MED ORDER — MIDAZOLAM HCL 2 MG/2ML IJ SOLN
INTRAMUSCULAR | Status: AC | PRN
  Administered 2020-02-16 (×2): 0.5 mg via INTRAVENOUS

## 2020-02-16 MED ORDER — SODIUM CHLORIDE 0.9 % IV SOLN: INTRAVENOUS | Status: DC

## 2020-02-16 MED ORDER — LIDOCAINE HCL (PF) 1 % IJ SOLN
INTRAMUSCULAR | Status: AC | PRN
  Administered 2020-02-16: 10 mL

## 2020-02-16 MED ORDER — MIDAZOLAM HCL 2 MG/2ML IJ SOLN
INTRAMUSCULAR | Status: AC
  Filled 2020-02-16: qty 2

## 2020-02-16 MED ORDER — CEFAZOLIN SODIUM-DEXTROSE 2-4 GM/100ML-% IV SOLN
INTRAVENOUS | Status: AC
  Administered 2020-02-16: 2 g via INTRAVENOUS
  Filled 2020-02-16: qty 100

## 2020-02-16 NOTE — Procedures (Signed)
Interventional Radiology Procedure Note  Procedure: Placement of a right IJ approach single lumen PowerPort.  Tip is positioned at the superior cavoatrial junction and catheter is ready for immediate use.  Complications: No immediate Recommendations:  - Ok to shower tomorrow - Do not submerge for 7 days - Routine line care   Signed,  Magdiel Bartles K. Denasia Venn, MD   

## 2020-02-16 NOTE — H&P (Signed)
Referring Physician(s): Mohamed,Mohamed  Supervising Physician: Jacqulynn Cadet  Patient Status:  Kayla Sawyer OP  Chief Complaint: "I'm here for a port a cath"   Subjective: Patient familiar to IR service from left lung mass biopsy on 01/12/2020.  She has a history of stage IV poorly differentiated lung adenocarcinoma and presented with large left upper lobe lung mass in addition to mediastinal lymphadenopathy and bilateral pulmonary nodules diagnosed in June of this year.  She presents again today for Port-A-Cath placement for chemotherapy.  She currently denies fever, headache, chest pain, abdominal/back pain, nausea, vomiting or bleeding.  She is an ex-smoker and does have dyspnea with exertion as well as occasional cough.  Past Medical History:  Diagnosis Date  . Arthritis   . COPD (chronic obstructive pulmonary disease) ([place])   . Depression   . E. coli pyelonephritis 6//2014  . GERD (gastroesophageal reflux disease)   . Pneumonia   . Pulmonary hypertension ([place])   . Sepsis [facility])    Past Surgical History:  Procedure Laterality Date  . ABDOMINAL HYSTERECTOMY     partial  . AORTA - BILATERAL FEMORAL ARTERY BYPASS GRAFT Bilateral 06/14/2016   Procedure: AORTOBIFEMORAL BYPASS GRAFT AORTA SMA BYPASS GRAFT RE-IMPLANTATION  OF SUPERIOR MESENTERIC ARTERY;  Surgeon: Serafina Mitchell, MD;  Location: [place];  Service: Vascular;  Laterality: Bilateral;  . BACK SURGERY     L4/5  . CARDIAC CATHETERIZATION N/A 01/03/2016   Procedure: Right/Left Heart Cath and Coronary Angiography;  Surgeon: Jolaine Artist, MD;  Location: [place] [facility];  Service: Cardiovascular;  Laterality: N/A;  . COLONOSCOPY N/A 08/05/2015   Procedure: COLONOSCOPY;  Surgeon: Rogene Houston, MD;  [facility];  Service: Endoscopy;  Laterality: N/A;  730  . ELBOW SURGERY    . PERIPHERAL VASCULAR CATHETERIZATION N/A 02/16/2016   Procedure: Abdominal Aortogram w/Lower Extremity;  Surgeon: Wellington Hampshire, MD;   Location: [place] [facility];  Service: Cardiovascular;  Laterality: N/A;      Allergies: Sulfur  Medications: Prior to Admission medications   Medication Sig Start Date End Date Taking? Authorizing Provider  acetaminophen (TYLENOL) 500 MG tablet Take 500 mg by mouth every 6 (six) hours as needed for mild pain.   Yes [provider]  albuterol (PROVENTIL) (2.5 MG/3ML) 0.083% nebulizer solution Take 3 mLs (2.5 mg total) by nebulization every 6 (six) hours as needed for wheezing or shortness of breath. 12/09/19  Yes Rigoberto Noel, MD  atorvastatin (LIPITOR) 40 MG tablet Take 40 mg by mouth daily.   Yes [provider]  folic acid (FOLVITE) 1 MG tablet Take 1 tablet (1 mg total) by mouth daily. 02/05/20  Yes Curt Bears, MD  levocetirizine (XYZAL) 5 MG tablet Take 5 mg by mouth every evening.  08/31/16  Yes [provider]  Multiple Vitamin (MULTIVITAMIN WITH MINERALS) TABS tablet Take 1 tablet by mouth daily.   Yes [provider]  omeprazole (PRILOSEC) 20 MG capsule Take 20 mg by mouth daily.  05/13/15  Yes [provider]  sertraline (ZOLOFT) 100 MG tablet Take 100 mg by mouth daily.  04/19/16  Yes [provider]  TRELEGY ELLIPTA 100-62.5-25 MCG/INH AEPB Inhale 1 puff into the lungs daily. 09/21/19  Yes [provider]  aspirin EC 81 MG tablet Take 1 tablet (81 mg total) by mouth daily. 02/08/16   Wellington Hampshire, MD  lidocaine-prilocaine (EMLA) cream Apply to the Port-A-Cath site 30-60-minute before chemotherapy every 3 weeks. 02/06/20  Curt Bears, MD  prochlorperazine (COMPAZINE) 10 MG tablet Take 1 tablet (10 mg total) by mouth every 6 (six) hours as needed for nausea or vomiting. 02/05/20   Curt Bears, MD     Vital Signs: BP 105/68   Pulse 92   Temp 97.6 F (36.4 C) (Oral)   Resp 18   SpO2 95%   Physical Exam awake, alert.  Chest with distant breath sounds bilaterally.  Heart with regular rate and  rhythm.  Abdomen soft, positive bowel sounds, nontender.  No lower extremity edema.  Imaging: No results found.  Labs:  CBC: Recent Labs    01/12/20 0940 01/20/20 1342  WBC 9.5 9.3  HGB 12.6 12.3  HCT 39.5 38.1  PLT 287 303    COAGS: Recent Labs    01/12/20 0940  INR 1.0    BMP: Recent Labs    12/18/19 1158 01/20/20 1342  NA  --  139  K  --  4.0  CL  --  105  CO2  --  25  GLUCOSE  --  88  BUN  --  17  CALCIUM  --  9.1  CREATININE 1.00 1.03*  GFRNONAA  --  54*  GFRAA  --  >60    LIVER FUNCTION TESTS: Recent Labs    01/20/20 1342  BILITOT 0.4  AST 11*  ALT 8  ALKPHOS 62  PROT 6.7  ALBUMIN 3.1*    Assessment and Plan: 74 yo female, ex smoker, with history of stage IV poorly differentiated lung adenocarcinoma ; she presented with large left upper lobe lung mass in addition to mediastinal lymphadenopathy and bilateral pulmonary nodules diagnosed in June of this year.  She presents today for Port-A-Cath placement for chemotherapy. Risks and benefits of image guided port-a-catheter placement was discussed with the patient including, but not limited to bleeding, infection, pneumothorax, or fibrin sheath development and need for additional procedures.  All of the patient's questions were answered, patient is agreeable to proceed. Consent signed and in chart.      Electronically Signed: D. Rowe Robert, PA-C 02/16/2020, 1:25 PM   I spent a total of 25 minutes at the the patient's bedside AND on the patient's hospital floor or unit, greater than 50% of which was counseling/coordinating care for Port-A-Cath placement

## 2020-02-16 NOTE — Discharge Instructions (Signed)
DO NOT APPLY ANY EMLA CREAM OR LOTIONS TO THE PORT SITE X 2 WEEKS  DO NOT SHOWER X 24 HRS   FOR ANY QUESTIONS OR CONCERNS CALL [phone]  Implanted Port Insertion, Care After This sheet gives you information about how to care for yourself after your procedure. Your health care provider may also give you more specific instructions. If you have problems or questions, contact your health care provider. What can I expect after the procedure? After the procedure, it is common to have:  Discomfort at the port insertion site.  Bruising on the skin over the port. This should improve over 3-4 days. Follow these instructions at home: [facility] care  After your port is placed, you will get a manufacturer's information card. The card has information about your port. Keep this card with you at all times.  Take care of the port as told by your health care provider. Ask your health care provider if you or a family member can get training for taking care of the port at home. A home health care nurse may also take care of the port.  Make sure to remember what type of port you have. Incision care      Follow instructions from your health care provider about how to take care of your port insertion site. Make sure you: ? Wash your hands with soap and water before and after you change your bandage (dressing). If soap and water are not available, use hand sanitizer. ? Change your dressing as told by your health care provider. ? Leave stitches (sutures), skin glue, or adhesive strips in place. These skin closures may need to stay in place for 2 weeks or longer. If adhesive strip edges start to loosen and curl up, you may trim the loose edges. Do not remove adhesive strips completely unless your health care provider tells you to do that.  Check your port insertion site every day for signs of infection. Check for: ? Redness, swelling, or pain. ? Fluid or blood. ? Warmth. ? Pus or a bad  smell. Activity  Return to your normal activities as told by your health care provider. Ask your health care provider what activities are safe for you.  Do not lift anything that is heavier than 10 lb (4.5 kg), or the limit that you are told, until your health care provider says that it is safe. General instructions  Take over-the-counter and prescription medicines only as told by your health care provider.  Do not take baths, swim, or use a hot tub until your health care provider approves. Ask your health care provider if you may take showers. You may only be allowed to take sponge baths.  Do not drive for 24 hours if you were given a sedative during your procedure.  Wear a medical alert bracelet in case of an emergency. This will tell any health care providers that you have a port.  Keep all follow-up visits as told by your health care provider. This is important. Contact a health care provider if:  You cannot flush your port with saline as directed, or you cannot draw blood from the port.  You have a fever or chills.  You have redness, swelling, or pain around your port insertion site.  You have fluid or blood coming from your port insertion site.  Your port insertion site feels warm to the touch.  You have pus or a bad smell coming from the port insertion site. Get help right away if:  You have chest pain or shortness of breath.  You have bleeding from your port that you cannot control. Summary  Take care of the port as told by your health care provider. Keep the manufacturer's information card with you at all times.  Change your dressing as told by your health care provider.  Contact a health care provider if you have a fever or chills or if you have redness, swelling, or pain around your port insertion site.  Keep all follow-up visits as told by your health care provider. This information is not intended to replace advice given to you by your health care provider.  Make sure you discuss any questions you have with your health care provider. Document Revised: 01/29/2018 Document Reviewed: 01/29/2018 Elsevier Patient Education  [place]. Moderate Conscious Sedation, Adult, Care After These instructions provide you with information about caring for yourself after your procedure. Your health care provider may also give you more specific instructions. Your treatment has been planned according to current medical practices, but problems sometimes occur. Call your health care provider if you have any problems or questions after your procedure. What can I expect after the procedure? After your procedure, it is common:  To feel sleepy for several hours.  To feel clumsy and have poor balance for several hours.  To have poor judgment for several hours.  To vomit if you eat too soon. Follow these instructions at home: For at least 24 hours after the procedure:   Do not: ? Participate in activities where you could fall or become injured. ? Drive. ? Use heavy machinery. ? Drink alcohol. ? Take sleeping pills or medicines that cause drowsiness. ? Make important decisions or sign legal documents. ? Take care of children on your own.  Rest. Eating and drinking  Follow the diet recommended by your health care provider.  If you vomit: ? Drink water, juice, or soup when you can drink without vomiting. ? Make sure you have little or no nausea before eating solid foods. General instructions  Have a responsible adult stay with you until you are awake and alert.  Take over-the-counter and prescription medicines only as told by your health care provider.  If you smoke, do not smoke without supervision.  Keep all follow-up visits as told by your health care provider. This is important. Contact a health care provider if:  You keep feeling nauseous or you keep vomiting.  You feel light-headed.  You develop a rash.  You have a fever. Get help  right away if:  You have trouble breathing. This information is not intended to replace advice given to you by your health care provider. Make sure you discuss any questions you have with your health care provider. Document Revised: 06/15/2017 Document Reviewed: 10/23/2015 Elsevier Patient Education  2020 [employer].

## 2020-02-17 ENCOUNTER — Telehealth: Payer: Self-pay | Admitting: Physician Assistant

## 2020-02-17 ENCOUNTER — Other Ambulatory Visit: Payer: Self-pay | Admitting: Internal Medicine

## 2020-02-17 ENCOUNTER — Telehealth: Payer: Self-pay | Admitting: Medical Oncology

## 2020-02-17 NOTE — Telephone Encounter (Signed)
She was supposed to start the first dose of her treatment on 02/12/2020.  Not sure why it's not scheduled as ordered.  Please check with the scheduler to get the patient started her treatment as soon as possible.

## 2020-02-17 NOTE — Telephone Encounter (Signed)
Treatment date?-"When is Klare starting treatment?"  No LOS from last visit. Orders placed for 7/29.

## 2020-02-17 NOTE — Telephone Encounter (Signed)
Scheduled appt per 8/3 sch msg - pt daughter is aware of appt.

## 2020-02-18 ENCOUNTER — Other Ambulatory Visit: Payer: Self-pay

## 2020-02-18 ENCOUNTER — Inpatient Hospital Stay: Payer: Medicare Other

## 2020-02-18 ENCOUNTER — Inpatient Hospital Stay: Payer: Medicare Other | Attending: Internal Medicine

## 2020-02-18 DIAGNOSIS — Z5112 Encounter for antineoplastic immunotherapy: Secondary | ICD-10-CM | POA: Diagnosis not present

## 2020-02-18 DIAGNOSIS — R590 Localized enlarged lymph nodes: Secondary | ICD-10-CM | POA: Insufficient documentation

## 2020-02-18 DIAGNOSIS — C3412 Malignant neoplasm of upper lobe, left bronchus or lung: Secondary | ICD-10-CM | POA: Insufficient documentation

## 2020-02-18 DIAGNOSIS — F17210 Nicotine dependence, cigarettes, uncomplicated: Secondary | ICD-10-CM | POA: Insufficient documentation

## 2020-02-18 DIAGNOSIS — C3492 Malignant neoplasm of unspecified part of left bronchus or lung: Secondary | ICD-10-CM

## 2020-02-18 DIAGNOSIS — I959 Hypotension, unspecified: Secondary | ICD-10-CM | POA: Diagnosis not present

## 2020-02-18 DIAGNOSIS — R000 Tachycardia, unspecified: Secondary | ICD-10-CM | POA: Diagnosis not present

## 2020-02-18 LAB — CBC WITH DIFFERENTIAL (CANCER CENTER ONLY)
Abs Immature Granulocytes: 0.04 10*3/uL (ref 0.00–0.07)
Basophils Absolute: 0.1 10*3/uL (ref 0.0–0.1)
Basophils Relative: 1 %
Eosinophils Absolute: 0.5 10*3/uL (ref 0.0–0.5)
Eosinophils Relative: 5 %
HCT: 35.1 % — ABNORMAL LOW (ref 36.0–46.0)
Hemoglobin: 11.2 g/dL — ABNORMAL LOW (ref 12.0–15.0)
Immature Granulocytes: 0 %
Lymphocytes Relative: 17 %
Lymphs Abs: 1.5 10*3/uL (ref 0.7–4.0)
MCH: 28.4 pg (ref 26.0–34.0)
MCHC: 31.9 g/dL (ref 30.0–36.0)
MCV: 88.9 fL (ref 80.0–100.0)
Monocytes Absolute: 0.7 10*3/uL (ref 0.1–1.0)
Monocytes Relative: 8 %
Neutro Abs: 6.3 10*3/uL (ref 1.7–7.7)
Neutrophils Relative %: 69 %
Platelet Count: 275 10*3/uL (ref 150–400)
RBC: 3.95 MIL/uL (ref 3.87–5.11)
RDW: 14.8 % (ref 11.5–15.5)
WBC Count: 9.1 10*3/uL (ref 4.0–10.5)
nRBC: 0 % (ref 0.0–0.2)

## 2020-02-18 LAB — CMP (CANCER CENTER ONLY)
ALT: <6 U/L (ref 0–44)
AST: 9 U/L — ABNORMAL LOW (ref 15–41)
Albumin: 3 g/dL — ABNORMAL LOW (ref 3.5–5.0)
Alkaline Phosphatase: 54 U/L (ref 38–126)
Anion gap: 8 (ref 5–15)
BUN: 23 mg/dL (ref 8–23)
CO2: 24 mmol/L (ref 22–32)
Calcium: 9.2 mg/dL (ref 8.9–10.3)
Chloride: 108 mmol/L (ref 98–111)
Creatinine: 1 mg/dL (ref 0.44–1.00)
GFR, Est AFR Am: >60 mL/min (ref >60)
GFR, Estimated: 56 mL/min — ABNORMAL LOW (ref >60)
Glucose, Bld: 141 mg/dL — ABNORMAL HIGH (ref 70–99)
Potassium: 4 mmol/L (ref 3.5–5.1)
Sodium: 140 mmol/L (ref 135–145)
Total Bilirubin: <0.2 mg/dL — ABNORMAL LOW (ref 0.3–1.2)
Total Protein: 6.1 g/dL — ABNORMAL LOW (ref 6.5–8.1)

## 2020-02-18 LAB — TSH: TSH: 0.506 u[IU]/mL (ref 0.308–3.960)

## 2020-02-18 NOTE — Progress Notes (Signed)
Pharmacist Chemotherapy Monitoring - Initial Assessment    Anticipated start date: 02/19/2020   Regimen:  . Are orders appropriate based on the patient's diagnosis, regimen, and cycle? Yes . Does the plan date match the patient's scheduled date? Yes . Is the sequencing of drugs appropriate? Yes . Are the premedications appropriate for the patient's regimen? Yes . Prior Authorization for treatment is: Approved o If applicable, is the correct biosimilar selected based on the patient's insurance? not applicable  Organ Function and Labs: Marland Kitchen Are dose adjustments needed based on the patient's renal function, hepatic function, or hematologic function? No, calculated CrCl with ABW is ~50 ml/min.  . Are appropriate labs ordered prior to the start of patient's treatment? Yes . Other organ system assessment, if indicated: N/A . The following baseline labs, if indicated, have been ordered: pembrolizumab: baseline TSH +/- T4   Dose Assessment: . Are the drug doses appropriate? Yes . Are the following correct: o Drug concentrations Yes o IV fluid compatible with drug Yes o Administration routes Yes o Timing of therapy Yes . If applicable, does the patient have documented access for treatment and/or plans for port-a-cath placement? yes . If applicable, have lifetime cumulative doses been properly documented and assessed? yes Lifetime Dose Tracking  No doses have been documented on this patient for the following tracked chemicals: Doxorubicin, Epirubicin, Idarubicin, Daunorubicin, Mitoxantrone, Bleomycin, Oxaliplatin, Carboplatin, Liposomal Doxorubicin  o   Toxicity Monitoring/Prevention: . The patient has the following take home antiemetics prescribed: Prochlorperazine . The patient has the following take home medications prescribed: B12 for pemetrexed and folic acid for pemetrexed. B12 IM administered 7/22.  . Medication allergies and previous infusion related reactions, if applicable, have been  reviewed and addressed. Yes . The patient's current medication list has been assessed for drug-drug interactions with their chemotherapy regimen. no significant drug-drug interactions were identified on review.  Order Review: . Are the treatment plan orders signed? Yes . Is the patient scheduled to see a provider prior to their treatment? No, last visit with MD 7/22  I verify that I have reviewed each item in the above checklist and answered each question accordingly.  Eddie Candle, PharmD PGY-2 Hematology/Oncology Pharmacy Resident  02/18/2020 9:06 AM

## 2020-02-19 ENCOUNTER — Ambulatory Visit
Admission: RE | Admit: 2020-02-19 | Discharge: 2020-02-19 | Disposition: A | Discharge status: Home / Self Care | Payer: Medicare Other | Source: Ambulatory Visit | Attending: Radiation Oncology | Admitting: Radiation Oncology

## 2020-02-19 ENCOUNTER — Inpatient Hospital Stay: Payer: Medicare Other

## 2020-02-19 ENCOUNTER — Other Ambulatory Visit: Payer: Self-pay

## 2020-02-19 VITALS — BP 116/66 | HR 100 | Temp 98.4°F | Resp 18 | Wt 144.2 lb

## 2020-02-19 DIAGNOSIS — R000 Tachycardia, unspecified: Secondary | ICD-10-CM | POA: Diagnosis not present

## 2020-02-19 DIAGNOSIS — C3492 Malignant neoplasm of unspecified part of left bronchus or lung: Secondary | ICD-10-CM

## 2020-02-19 DIAGNOSIS — C3412 Malignant neoplasm of upper lobe, left bronchus or lung: Secondary | ICD-10-CM | POA: Diagnosis not present

## 2020-02-19 DIAGNOSIS — R590 Localized enlarged lymph nodes: Secondary | ICD-10-CM | POA: Diagnosis not present

## 2020-02-19 DIAGNOSIS — F17210 Nicotine dependence, cigarettes, uncomplicated: Secondary | ICD-10-CM | POA: Diagnosis not present

## 2020-02-19 DIAGNOSIS — Z5112 Encounter for antineoplastic immunotherapy: Secondary | ICD-10-CM | POA: Diagnosis not present

## 2020-02-19 DIAGNOSIS — I959 Hypotension, unspecified: Secondary | ICD-10-CM | POA: Diagnosis not present

## 2020-02-19 MED ORDER — PALONOSETRON HCL INJECTION 0.25 MG/5ML
0.2500 mg | Freq: Once | INTRAVENOUS | Status: AC
  Administered 2020-02-19: 0.25 mg via INTRAVENOUS

## 2020-02-19 MED ORDER — SODIUM CHLORIDE 0.9 % IV SOLN
10.0000 mg | Freq: Once | INTRAVENOUS | Status: AC
  Administered 2020-02-19: 10 mg via INTRAVENOUS
  Filled 2020-02-19: qty 10

## 2020-02-19 MED ORDER — SODIUM CHLORIDE 0.9 % IV SOLN
200.0000 mg | Freq: Once | INTRAVENOUS | Status: AC
  Administered 2020-02-19: 200 mg via INTRAVENOUS
  Filled 2020-02-19: qty 8

## 2020-02-19 MED ORDER — HEPARIN SOD (PORK) LOCK FLUSH 100 UNIT/ML IV SOLN
500.0000 [IU] | Freq: Once | INTRAVENOUS | Status: AC | PRN
  Administered 2020-02-19: 500 [IU]
  Filled 2020-02-19: qty 5

## 2020-02-19 MED ORDER — SODIUM CHLORIDE 0.9 % IV SOLN
375.0000 mg | Freq: Once | INTRAVENOUS | Status: AC
  Administered 2020-02-19: 380 mg via INTRAVENOUS
  Filled 2020-02-19: qty 38

## 2020-02-19 MED ORDER — SODIUM CHLORIDE 0.9 % IV SOLN
150.0000 mg | Freq: Once | INTRAVENOUS | Status: AC
  Administered 2020-02-19: 150 mg via INTRAVENOUS
  Filled 2020-02-19: qty 150

## 2020-02-19 MED ORDER — SODIUM CHLORIDE 0.9 % IV SOLN
Freq: Once | INTRAVENOUS | Status: AC
  Filled 2020-02-19: qty 250

## 2020-02-19 MED ORDER — SODIUM CHLORIDE 0.9 % IV SOLN
500.0000 mg/m2 | Freq: Once | INTRAVENOUS | Status: AC
  Administered 2020-02-19: 900 mg via INTRAVENOUS
  Filled 2020-02-19: qty 20

## 2020-02-19 MED ORDER — SODIUM CHLORIDE 0.9% FLUSH
10.0000 mL | INTRAVENOUS | Status: DC | PRN
  Administered 2020-02-19: 10 mL
  Filled 2020-02-19: qty 10

## 2020-02-19 MED ORDER — PALONOSETRON HCL INJECTION 0.25 MG/5ML
INTRAVENOUS | Status: AC
  Filled 2020-02-19: qty 5

## 2020-02-19 NOTE — Patient Instructions (Signed)
Pembrolizumab injection What is this medicine? PEMBROLIZUMAB (pem broe liz ue mab) is a monoclonal antibody. It is used to treat certain types of cancer. This medicine may be used for other purposes; ask your health care provider or pharmacist if you have questions. COMMON BRAND NAME(S): Keytruda What should I tell my health care provider before I take this medicine? They need to know if you have any of these conditions:  diabetes  immune system problems  inflammatory bowel disease  liver disease  lung or breathing disease  lupus  received or scheduled to receive an organ transplant or a stem-cell transplant that uses donor stem cells  an unusual or allergic reaction to pembrolizumab, other medicines, foods, dyes, or preservatives  pregnant or trying to get pregnant  breast-feeding How should I use this medicine? This medicine is for infusion into a vein. It is given by a health care professional in a hospital or clinic setting. A special MedGuide will be given to you before each treatment. Be sure to read this information carefully each time. Talk to your pediatrician regarding the use of this medicine in children. While this drug may be prescribed for children as young as 6 months for selected conditions, precautions do apply. Overdosage: If you think you have taken too much of this medicine contact a poison control center or emergency room at once. NOTE: This medicine is only for you. Do not share this medicine with others. What if I miss a dose? It is important not to miss your dose. Call your doctor or health care professional if you are unable to keep an appointment. What may interact with this medicine? Interactions have not been studied. Give your health care provider a list of all the medicines, herbs, non-prescription drugs, or dietary supplements you use. Also tell them if you smoke, drink alcohol, or use illegal drugs. Some items may interact with your medicine. This  list may not describe all possible interactions. Give your health care provider a list of all the medicines, herbs, non-prescription drugs, or dietary supplements you use. Also tell them if you smoke, drink alcohol, or use illegal drugs. Some items may interact with your medicine. What should I watch for while using this medicine? Your condition will be monitored carefully while you are receiving this medicine. You may need blood work done while you are taking this medicine. Do not become pregnant while taking this medicine or for 4 months after stopping it. Women should inform their doctor if they wish to become pregnant or think they might be pregnant. There is a potential for serious side effects to an unborn child. Talk to your health care professional or pharmacist for more information. Do not breast-feed an infant while taking this medicine or for 4 months after the last dose. What side effects may I notice from receiving this medicine? Side effects that you should report to your doctor or health care professional as soon as possible:  allergic reactions like skin rash, itching or hives, swelling of the face, lips, or tongue  bloody or black, tarry  breathing problems  changes in vision  chest pain  chills  confusion  constipation  cough  diarrhea  dizziness or feeling faint or lightheaded  fast or irregular heartbeat  fever  flushing  joint pain  low blood counts - this medicine may decrease the number of white blood cells, red blood cells and platelets. You may be at increased risk for infections and bleeding.  muscle pain  muscle   weakness  pain, tingling, numbness in the hands or feet  persistent headache  redness, blistering, peeling or loosening of the skin, including inside the mouth  signs and symptoms of high blood sugar such as dizziness; dry mouth; dry skin; fruity breath; nausea; stomach pain; increased hunger or thirst; increased urination  signs  and symptoms of kidney injury like trouble passing urine or change in the amount of urine  signs and symptoms of liver injury like dark urine, light-colored stools, loss of appetite, nausea, right upper belly pain, yellowing of the eyes or skin  sweating  swollen lymph nodes  weight loss Side effects that usually do not require medical attention (report to your doctor or health care professional if they continue or are bothersome):  decreased appetite  hair loss  muscle pain  tiredness This list may not describe all possible side effects. Call your doctor for medical advice about side effects. You may report side effects to FDA at 1-800-FDA-1088. Where should I keep my medicine? This drug is given in a hospital or clinic and will not be stored at home. NOTE: This sheet is a summary. It may not cover all possible information. If you have questions about this medicine, talk to your doctor, pharmacist, or health care provider.  2020 Elsevier/Gold Standard (2019-05-09 18:07:58) Pemetrexed injection What is this medicine? PEMETREXED (PEM e TREX ed) is a chemotherapy drug used to treat lung cancers like non-small cell lung cancer and mesothelioma. It may also be used to treat other cancers. This medicine may be used for other purposes; ask your health care provider or pharmacist if you have questions. COMMON BRAND NAME(S): Alimta What should I tell my health care provider before I take this medicine? They need to know if you have any of these conditions:  infection (especially a virus infection such as chickenpox, cold sores, or herpes)  kidney disease  low blood counts, like low white cell, platelet, or red cell counts  lung or breathing disease, like asthma  radiation therapy  an unusual or allergic reaction to pemetrexed, other medicines, foods, dyes, or preservative  pregnant or trying to get pregnant  breast-feeding How should I use this medicine? This drug is given as  an infusion into a vein. It is administered in a hospital or clinic by a specially trained health care professional. Talk to your pediatrician regarding the use of this medicine in children. Special care may be needed. Overdosage: If you think you have taken too much of this medicine contact a poison control center or emergency room at once. NOTE: This medicine is only for you. Do not share this medicine with others. What if I miss a dose? It is important not to miss your dose. Call your doctor or health care professional if you are unable to keep an appointment. What may interact with this medicine? This medicine may interact with the following medications:  Ibuprofen This list may not describe all possible interactions. Give your health care provider a list of all the medicines, herbs, non-prescription drugs, or dietary supplements you use. Also tell them if you smoke, drink alcohol, or use illegal drugs. Some items may interact with your medicine. What should I watch for while using this medicine? Visit your doctor for checks on your progress. This drug may make you feel generally unwell. This is not uncommon, as chemotherapy can affect healthy cells as well as cancer cells. Report any side effects. Continue your course of treatment even though you feel ill unless   your doctor tells you to stop. In some cases, you may be given additional medicines to help with side effects. Follow all directions for their use. Call your doctor or health care professional for advice if you get a fever, chills or sore throat, or other symptoms of a cold or flu. Do not treat yourself. This drug decreases your body's ability to fight infections. Try to avoid being around people who are sick. This medicine may increase your risk to bruise or bleed. Call your doctor or health care professional if you notice any unusual bleeding. Be careful brushing and flossing your teeth or using a toothpick because you may get an  infection or bleed more easily. If you have any dental work done, tell your dentist you are receiving this medicine. Avoid taking products that contain aspirin, acetaminophen, ibuprofen, naproxen, or ketoprofen unless instructed by your doctor. These medicines may hide a fever. Call your doctor or health care professional if you get diarrhea or mouth sores. Do not treat yourself. To protect your kidneys, drink water or other fluids as directed while you are taking this medicine. Do not become pregnant while taking this medicine or for 6 months after stopping it. Women should inform their doctor if they wish to become pregnant or think they might be pregnant. Men should not father a child while taking this medicine and for 3 months after stopping it. This may interfere with the ability to father a child. You should talk to your doctor or health care professional if you are concerned about your fertility. There is a potential for serious side effects to an unborn child. Talk to your health care professional or pharmacist for more information. Do not breast-feed an infant while taking this medicine or for 1 week after stopping it. What side effects may I notice from receiving this medicine? Side effects that you should report to your doctor or health care professional as soon as possible:  allergic reactions like skin rash, itching or hives, swelling of the face, lips, or tongue  breathing problems  redness, blistering, peeling or loosening of the skin, including inside the mouth  signs and symptoms of bleeding such as bloody or black, tarry stools; red or dark-brown urine; spitting up blood or brown material that looks like coffee grounds; red spots on the skin; unusual bruising or bleeding from the eye, gums, or nose  signs and symptoms of infection like fever or chills; cough; sore throat; pain or trouble passing urine  signs and symptoms of kidney injury like trouble passing urine or change in the  amount of urine  signs and symptoms of liver injury like dark yellow or brown urine; general ill feeling or flu-like symptoms; light-colored stools; loss of appetite; nausea; right upper belly pain; unusually weak or tired; yellowing of the eyes or skin Side effects that usually do not require medical attention (report to your doctor or health care professional if they continue or are bothersome):  constipation  mouth sores  nausea, vomiting  unusually weak or tired This list may not describe all possible side effects. Call your doctor for medical advice about side effects. You may report side effects to FDA at 1-800-FDA-1088. Where should I keep my medicine? This drug is given in a hospital or clinic and will not be stored at home. NOTE: This sheet is a summary. It may not cover all possible information. If you have questions about this medicine, talk to your doctor, pharmacist, or health care provider.  2020   Elsevier/Gold Standard (2017-08-22 16:11:33) Carboplatin injection What is this medicine? CARBOPLATIN (KAR boe pla tin) is a chemotherapy drug. It targets fast dividing cells, like cancer cells, and causes these cells to die. This medicine is used to treat ovarian cancer and many other cancers. This medicine may be used for other purposes; ask your health care provider or pharmacist if you have questions. COMMON BRAND NAME(S): Paraplatin What should I tell my health care provider before I take this medicine? They need to know if you have any of these conditions:  blood disorders  hearing problems  kidney disease  recent or ongoing radiation therapy  an unusual or allergic reaction to carboplatin, cisplatin, other chemotherapy, other medicines, foods, dyes, or preservatives  pregnant or trying to get pregnant  breast-feeding How should I use this medicine? This drug is usually given as an infusion into a vein. It is administered in a hospital or clinic by a specially  trained health care professional. Talk to your pediatrician regarding the use of this medicine in children. Special care may be needed. Overdosage: If you think you have taken too much of this medicine contact a poison control center or emergency room at once. NOTE: This medicine is only for you. Do not share this medicine with others. What if I miss a dose? It is important not to miss a dose. Call your doctor or health care professional if you are unable to keep an appointment. What may interact with this medicine?  medicines for seizures  medicines to increase blood counts like filgrastim, pegfilgrastim, sargramostim  some antibiotics like amikacin, gentamicin, neomycin, streptomycin, tobramycin  vaccines Talk to your doctor or health care professional before taking any of these medicines:  acetaminophen  aspirin  ibuprofen  ketoprofen  naproxen This list may not describe all possible interactions. Give your health care provider a list of all the medicines, herbs, non-prescription drugs, or dietary supplements you use. Also tell them if you smoke, drink alcohol, or use illegal drugs. Some items may interact with your medicine. What should I watch for while using this medicine? Your condition will be monitored carefully while you are receiving this medicine. You will need important blood work done while you are taking this medicine. This drug may make you feel generally unwell. This is not uncommon, as chemotherapy can affect healthy cells as well as cancer cells. Report any side effects. Continue your course of treatment even though you feel ill unless your doctor tells you to stop. In some cases, you may be given additional medicines to help with side effects. Follow all directions for their use. Call your doctor or health care professional for advice if you get a fever, chills or sore throat, or other symptoms of a cold or flu. Do not treat yourself. This drug decreases your body's  ability to fight infections. Try to avoid being around people who are sick. This medicine may increase your risk to bruise or bleed. Call your doctor or health care professional if you notice any unusual bleeding. Be careful brushing and flossing your teeth or using a toothpick because you may get an infection or bleed more easily. If you have any dental work done, tell your dentist you are receiving this medicine. Avoid taking products that contain aspirin, acetaminophen, ibuprofen, naproxen, or ketoprofen unless instructed by your doctor. These medicines may hide a fever. Do not become pregnant while taking this medicine. Women should inform their doctor if they wish to become pregnant or think they might   be pregnant. There is a potential for serious side effects to an unborn child. Talk to your health care professional or pharmacist for more information. Do not breast-feed an infant while taking this medicine. What side effects may I notice from receiving this medicine? Side effects that you should report to your doctor or health care professional as soon as possible:  allergic reactions like skin rash, itching or hives, swelling of the face, lips, or tongue  signs of infection - fever or chills, cough, sore throat, pain or difficulty passing urine  signs of decreased platelets or bleeding - bruising, pinpoint red spots on the skin, black, tarry stools, nosebleeds  signs of decreased red blood cells - unusually weak or tired, fainting spells, lightheadedness  breathing problems  changes in hearing  changes in vision  chest pain  high blood pressure  low blood counts - This drug may decrease the number of white blood cells, red blood cells and platelets. You may be at increased risk for infections and bleeding.  nausea and vomiting  pain, swelling, redness or irritation at the injection site  pain, tingling, numbness in the hands or feet  problems with balance, talking,  walking  trouble passing urine or change in the amount of urine Side effects that usually do not require medical attention (report to your doctor or health care professional if they continue or are bothersome):  hair loss  loss of appetite  metallic taste in the mouth or changes in taste This list may not describe all possible side effects. Call your doctor for medical advice about side effects. You may report side effects to FDA at 1-800-FDA-1088. Where should I keep my medicine? This drug is given in a hospital or clinic and will not be stored at home. NOTE: This sheet is a summary. It may not cover all possible information. If you have questions about this medicine, talk to your doctor, pharmacist, or health care provider.  2020 Elsevier/Gold Standard (2007-10-08 14:38:05)  

## 2020-02-20 ENCOUNTER — Telehealth: Payer: Self-pay | Admitting: *Deleted

## 2020-02-20 ENCOUNTER — Ambulatory Visit
Admission: RE | Admit: 2020-02-20 | Discharge: 2020-02-20 | Disposition: A | Discharge status: Home / Self Care | Payer: Medicare Other | Source: Ambulatory Visit | Attending: Radiation Oncology | Admitting: Radiation Oncology

## 2020-02-20 DIAGNOSIS — C3412 Malignant neoplasm of upper lobe, left bronchus or lung: Secondary | ICD-10-CM | POA: Diagnosis not present

## 2020-02-23 ENCOUNTER — Ambulatory Visit
Admission: RE | Admit: 2020-02-23 | Discharge: 2020-02-23 | Disposition: A | Discharge status: Home / Self Care | Payer: Medicare Other | Source: Ambulatory Visit | Attending: Radiation Oncology | Admitting: Radiation Oncology

## 2020-02-23 ENCOUNTER — Other Ambulatory Visit: Payer: Self-pay

## 2020-02-23 DIAGNOSIS — C3412 Malignant neoplasm of upper lobe, left bronchus or lung: Secondary | ICD-10-CM | POA: Diagnosis not present

## 2020-02-24 ENCOUNTER — Ambulatory Visit
Admission: RE | Admit: 2020-02-24 | Discharge: 2020-02-24 | Disposition: A | Discharge status: Home / Self Care | Payer: Medicare Other | Source: Ambulatory Visit | Attending: Radiation Oncology | Admitting: Radiation Oncology

## 2020-02-24 ENCOUNTER — Other Ambulatory Visit: Payer: Self-pay

## 2020-02-24 DIAGNOSIS — C3412 Malignant neoplasm of upper lobe, left bronchus or lung: Secondary | ICD-10-CM | POA: Diagnosis not present

## 2020-02-25 ENCOUNTER — Ambulatory Visit
Admission: RE | Admit: 2020-02-25 | Discharge: 2020-02-25 | Disposition: A | Discharge status: Home / Self Care | Payer: Medicare Other | Source: Ambulatory Visit | Attending: Radiation Oncology | Admitting: Radiation Oncology

## 2020-02-25 ENCOUNTER — Other Ambulatory Visit: Payer: Self-pay

## 2020-02-25 DIAGNOSIS — C3412 Malignant neoplasm of upper lobe, left bronchus or lung: Secondary | ICD-10-CM | POA: Diagnosis not present

## 2020-02-26 ENCOUNTER — Encounter: Payer: Self-pay | Admitting: Internal Medicine

## 2020-02-26 ENCOUNTER — Inpatient Hospital Stay: Payer: Medicare Other

## 2020-02-26 ENCOUNTER — Inpatient Hospital Stay: Payer: Medicare Other | Admitting: Internal Medicine

## 2020-02-26 ENCOUNTER — Ambulatory Visit
Admission: RE | Admit: 2020-02-26 | Discharge: 2020-02-26 | Disposition: A | Discharge status: Home / Self Care | Payer: Medicare Other | Source: Ambulatory Visit | Attending: Radiation Oncology | Admitting: Radiation Oncology

## 2020-02-26 ENCOUNTER — Other Ambulatory Visit: Payer: Self-pay

## 2020-02-26 VITALS — BP 105/80 | HR 113 | Temp 95.3°F | Resp 17 | Ht 68.0 in | Wt 140.7 lb

## 2020-02-26 DIAGNOSIS — C3492 Malignant neoplasm of unspecified part of left bronchus or lung: Secondary | ICD-10-CM

## 2020-02-26 DIAGNOSIS — Z95828 Presence of other vascular implants and grafts: Secondary | ICD-10-CM | POA: Insufficient documentation

## 2020-02-26 DIAGNOSIS — R000 Tachycardia, unspecified: Secondary | ICD-10-CM | POA: Diagnosis not present

## 2020-02-26 DIAGNOSIS — Z5111 Encounter for antineoplastic chemotherapy: Secondary | ICD-10-CM

## 2020-02-26 DIAGNOSIS — I959 Hypotension, unspecified: Secondary | ICD-10-CM | POA: Diagnosis not present

## 2020-02-26 DIAGNOSIS — C3412 Malignant neoplasm of upper lobe, left bronchus or lung: Secondary | ICD-10-CM | POA: Diagnosis not present

## 2020-02-26 DIAGNOSIS — R590 Localized enlarged lymph nodes: Secondary | ICD-10-CM | POA: Diagnosis not present

## 2020-02-26 DIAGNOSIS — Z5112 Encounter for antineoplastic immunotherapy: Secondary | ICD-10-CM | POA: Diagnosis not present

## 2020-02-26 DIAGNOSIS — F17210 Nicotine dependence, cigarettes, uncomplicated: Secondary | ICD-10-CM | POA: Diagnosis not present

## 2020-02-26 LAB — CMP (CANCER CENTER ONLY)
ALT: 32 U/L (ref 0–44)
AST: 32 U/L (ref 15–41)
Albumin: 3.3 g/dL — ABNORMAL LOW (ref 3.5–5.0)
Alkaline Phosphatase: 51 U/L (ref 38–126)
Anion gap: 9 (ref 5–15)
BUN: 37 mg/dL — ABNORMAL HIGH (ref 8–23)
CO2: 27 mmol/L (ref 22–32)
Calcium: 8.4 mg/dL — ABNORMAL LOW (ref 8.9–10.3)
Chloride: 101 mmol/L (ref 98–111)
Creatinine: 1.03 mg/dL — ABNORMAL HIGH (ref 0.44–1.00)
GFR, Est AFR Am: >60 mL/min (ref >60)
GFR, Estimated: 54 mL/min — ABNORMAL LOW (ref >60)
Glucose, Bld: 149 mg/dL — ABNORMAL HIGH (ref 70–99)
Potassium: 4 mmol/L (ref 3.5–5.1)
Sodium: 137 mmol/L (ref 135–145)
Total Bilirubin: 0.4 mg/dL (ref 0.3–1.2)
Total Protein: 6.6 g/dL (ref 6.5–8.1)

## 2020-02-26 LAB — CBC WITH DIFFERENTIAL (CANCER CENTER ONLY)
Abs Immature Granulocytes: 0.04 10*3/uL (ref 0.00–0.07)
Basophils Absolute: 0 10*3/uL (ref 0.0–0.1)
Basophils Relative: 1 %
Eosinophils Absolute: 0.4 10*3/uL (ref 0.0–0.5)
Eosinophils Relative: 8 %
HCT: 34.7 % — ABNORMAL LOW (ref 36.0–46.0)
Hemoglobin: 11.1 g/dL — ABNORMAL LOW (ref 12.0–15.0)
Immature Granulocytes: 1 %
Lymphocytes Relative: 21 %
Lymphs Abs: 1 10*3/uL (ref 0.7–4.0)
MCH: 28.1 pg (ref 26.0–34.0)
MCHC: 32 g/dL (ref 30.0–36.0)
MCV: 87.8 fL (ref 80.0–100.0)
Monocytes Absolute: 0.3 10*3/uL (ref 0.1–1.0)
Monocytes Relative: 6 %
Neutro Abs: 2.9 10*3/uL (ref 1.7–7.7)
Neutrophils Relative %: 63 %
Platelet Count: 161 10*3/uL (ref 150–400)
RBC: 3.95 MIL/uL (ref 3.87–5.11)
RDW: 14.4 % (ref 11.5–15.5)
WBC Count: 4.5 10*3/uL (ref 4.0–10.5)
nRBC: 0 % (ref 0.0–0.2)

## 2020-02-26 MED ORDER — SODIUM CHLORIDE 0.9% FLUSH
10.0000 mL | Freq: Once | INTRAVENOUS | Status: AC
  Administered 2020-02-26: 10 mL
  Filled 2020-02-26: qty 10

## 2020-02-26 MED ORDER — HEPARIN SOD (PORK) LOCK FLUSH 100 UNIT/ML IV SOLN
500.0000 [IU] | Freq: Once | INTRAVENOUS | Status: AC
  Administered 2020-02-26: 500 [IU]
  Filled 2020-02-26: qty 5

## 2020-02-26 NOTE — Progress Notes (Signed)
[place] Telephone:(336) [phone]   Fax:(336) [phone]  OFFICE PROGRESS NOTE  Celene Squibb, MD [address] [place] [address]  DIAGNOSIS: Stage IV (T3, N2, M1a) non-small cell lung cancer, poorly differentiated adenocarcinoma presented with large left upper lobe lung mass in addition to mediastinal lymphadenopathy and bilateral pulmonary nodules diagnosed in June 2021.  Biomarker Findings Tumor Mutational Burden - 10 Muts/Mb Microsatellite status - MS-Stable Genomic Findings For a complete list of the genes assayed, please refer to the Appendix. NF1 Q2582* KRAS G12V CTNNB1 A622f*10 TP53 H179N 7 Disease relevant genes with no reportable alterations: ALK, BRAF, EGFR, ERBB2, MET, RET, ROS1  PDL1 Expression: 100%  PRIOR THERAPY: Palliative radiotherapy to the large left upper lobe lung mass.  CURRENT THERAPY: Systemic chemotherapy with carboplatin for AUC of 5, Alimta 500 mg/M2 and Keytruda 200 mg IV every 3 weeks.  First dose 02/12/2020.  Status post 1 cycle.  INTERVAL HISTORY: Kayla EMPIE74y.o. female returns to the clinic today for follow-up visit.  The patient is feeling fine today with no concerning complaints except for the baseline shortness of breath and she is currently on home oxygen.  She denied having any chest pain, cough or hemoptysis.  She denied having any fever or chills.  She has no nausea, vomiting, diarrhea or constipation.  She continues to have fatigue and generalized weakness.  She tolerated the first cycle of her treatment with carboplatin, Alimta and Keytruda fairly well.  She is also undergoing palliative radiotherapy to the large left upper lobe lung mass.  She is here today for evaluation before starting cycle #2.  MEDICAL HISTORY: Past Medical History:  Diagnosis Date  . Arthritis   . COPD [place] disease) [place]   . Depression   . E. coli pyelonephritis 6//2014  . GERD (gastroesophageal reflux  disease)   . Pneumonia   . Pulmonary hypertension ([place]   . [place]is [place]     ALLERGIES:  is allergic to sulfur.  MEDICATIONS:  Current Outpatient Medications  Medication Sig Dispense Refill  . acetaminophen (TYLENOL) 500 MG tablet Take 500 mg by mouth every 6 (six) hours as needed for mild pain.    .Marland Kitchenalbuterol (PROVENTIL) (2.5 MG/3ML) 0.083% nebulizer solution Take 3 mLs (2.5 mg total) by nebulization every 6 (six) hours as needed for wheezing or shortness of breath. 75 mL 12  . aspirin EC 81 MG tablet Take 1 tablet (81 mg total) by mouth daily.    .Marland Kitchenatorvastatin (LIPITOR) 40 MG tablet Take 40 mg by mouth daily.    . folic acid (FOLVITE) 1 MG tablet Take 1 tablet (1 mg total) by mouth daily. 30 tablet 4  . levocetirizine (XYZAL) 5 MG tablet Take 5 mg by mouth every evening.     . lidocaine-prilocaine (EMLA) cream Apply to the Port-A-Cath site 30-60-minute before chemotherapy every 3 weeks. 30 g 0  . Multiple Vitamin (MULTIVITAMIN WITH MINERALS) TABS tablet Take 1 tablet by mouth daily.    .Marland Kitchenomeprazole (PRILOSEC) 20 MG capsule Take 20 mg by mouth daily.     . prochlorperazine (COMPAZINE) 10 MG tablet Take 1 tablet (10 mg total) by mouth every 6 (six) hours as needed for nausea or vomiting. 30 tablet 0  . sertraline (ZOLOFT) 100 MG tablet Take 100 mg by mouth daily.     . TRELEGY ELLIPTA 100-62.5-25 MCG/INH AEPB Inhale 1 puff into the lungs daily.     No  current facility-administered medications for this visit.    SURGICAL HISTORY:  Past Surgical History:  Procedure Laterality Date  . ABDOMINAL HYSTERECTOMY     partial  . AORTA - BILATERAL FEMORAL ARTERY BYPASS GRAFT Bilateral 06/14/2016   Procedure: AORTOBIFEMORAL BYPASS GRAFT AORTA SMA BYPASS GRAFT RE-IMPLANTATION  OF SUPERIOR MESENTERIC ARTERY;  Surgeon: Serafina Mitchell, MD;  Location: [facility];  Service: Vascular;  Laterality: Bilateral;  . BACK SURGERY     L4/5  . CARDIAC CATHETERIZATION N/A 01/03/2016   Procedure: Right/Left  Heart Cath and Coronary Angiography;  Surgeon: Jolaine Artist, MD;  Location: [facility];  Service: Cardiovascular;  Laterality: N/A;  . COLONOSCOPY N/A 08/05/2015   Procedure: COLONOSCOPY;  Surgeon: Rogene Houston, MD;  [facility];  Service: Endoscopy;  Laterality: N/A;  730  . ELBOW SURGERY    . IR IMAGING GUIDED PORT INSERTION  02/16/2020  . PERIPHERAL VASCULAR CATHETERIZATION N/A 02/16/2016   Procedure: Abdominal Aortogram w/Lower Extremity;  Surgeon: Wellington Hampshire, MD;  Location: [facility];  Service: Cardiovascular;  Laterality: N/A;    REVIEW OF SYSTEMS:  A comprehensive review of systems was negative except for: Constitutional: positive for fatigue Respiratory: positive for dyspnea on exertion Musculoskeletal: positive for muscle weakness   PHYSICAL EXAMINATION: General appearance: alert, cooperative, fatigued and no distress Head: Normocephalic, without obvious abnormality, atraumatic Neck: no adenopathy, no JVD, supple, symmetrical, trachea midline and thyroid not enlarged, symmetric, no tenderness/mass/nodules Lymph nodes: Cervical, supraclavicular, and axillary nodes normal. Resp: clear to auscultation bilaterally Back: symmetric, no curvature. ROM normal. No CVA tenderness. Cardio: regular rate and rhythm, S1, S2 normal, no murmur, click, rub or gallop GI: soft, non-tender; bowel sounds normal; no masses,  no organomegaly Extremities: extremities normal, atraumatic, no cyanosis or edema  ECOG PERFORMANCE STATUS: 1 - Symptomatic but completely ambulatory  Blood pressure 105/80, pulse (!) 113, temperature (!) 95.3 F (35.2 C), temperature source Tympanic, resp. rate 17, height '5\' 8"'$  (1.727 m), weight 140 lb 11.2 oz (63.8 kg), SpO2 96 %.  LABORATORY DATA: Lab Results  Component Value Date   WBC 4.5 02/26/2020   HGB 11.1 (L) 02/26/2020   HCT 34.7 (L) 02/26/2020   MCV 87.8 02/26/2020   PLT 161 02/26/2020      Chemistry      Component  Value Date/Time   NA 140 02/18/2020 1330   K 4.0 02/18/2020 1330   CL 108 02/18/2020 1330   CO2 24 02/18/2020 1330   BUN 23 02/18/2020 1330   CREATININE 1.00 02/18/2020 1330   CREATININE 0.90 02/08/2016 0949      Component Value Date/Time   CALCIUM 9.2 02/18/2020 1330   ALKPHOS 54 02/18/2020 1330   AST 9 (L) 02/18/2020 1330   ALT <6 02/18/2020 1330   BILITOT <0.2 (L) 02/18/2020 1330       RADIOGRAPHIC STUDIES: MR BRAIN W WO CONTRAST  Result Date: 01/29/2020 CLINICAL DATA:  Non-small cell lung cancer, staging. EXAM: MRI HEAD WITHOUT AND WITH CONTRAST TECHNIQUE: Multiplanar, multiecho pulse sequences of the brain and surrounding structures were obtained without and with intravenous contrast. CONTRAST:  61m GADAVIST GADOBUTROL 1 MMOL/ML IV SOLN COMPARISON:  08/16/2016 head CT.  07/07/2011 MRI head. FINDINGS: Brain: Scattered and confluent T2/FLAIR hyperintense foci involving the periventricular and deep white matter are nonspecific however commonly associated with chronic microvascular ischemic changes. No acute infarct or intracranial hemorrhage. No midline shift, ventriculomegaly or extra-axial fluid collection. No mass lesion. No abnormal enhancement. Vascular:  Normal flow voids. Skull and upper cervical spine: Normal marrow signal. Sinuses/Orbits: Normal orbits. Clear paranasal sinuses. Small right mastoid effusion. Other: Nasopharyngeal mucous retention cysts. IMPRESSION: No evidence of intracranial metastases. Mild chronic microvascular ischemic changes. Electronically Signed   By: Primitivo Gauze M.D.   On: 01/29/2020 10:21   IR IMAGING GUIDED PORT INSERTION  Result Date: 02/16/2020 INDICATION: 74 year old female with stage IV adenocarcinoma of the left lung. She requires durable venous access for chemotherapy and presents for portacatheter placement. EXAM: IMPLANTED PORT A CATH PLACEMENT WITH ULTRASOUND AND FLUOROSCOPIC GUIDANCE MEDICATIONS: 2 g Ancef; The antibiotic was  administered within an appropriate time interval prior to skin puncture. ANESTHESIA/SEDATION: Versed 1 mg IV; Fentanyl 50 mcg IV; Moderate Sedation Time:  24 minutes The patient was continuously monitored during the procedure by the interventional radiology nurse under my direct supervision. FLUOROSCOPY TIME:  0 minutes, 12 seconds (1 mGy) COMPLICATIONS: None immediate. PROCEDURE: The right neck and chest was prepped with chlorhexidine, and draped in the usual sterile fashion using maximum barrier technique (cap and mask, sterile gown, sterile gloves, large sterile sheet, hand hygiene and cutaneous antiseptic). Local anesthesia was attained by infiltration with 1% lidocaine with epinephrine. Ultrasound demonstrated patency of the right internal jugular vein, and this was documented with an image. Under real-time ultrasound guidance, this vein was accessed with a 21 gauge micropuncture needle and image documentation was performed. A small dermatotomy was made at the access site with an 11 scalpel. A 0.018" wire was advanced into the SVC and the access needle exchanged for a 78F micropuncture vascular sheath. The 0.018" wire was then removed and a 0.035" wire advanced into the IVC. An appropriate location for the subcutaneous reservoir was selected below the clavicle and an incision was made through the skin and underlying soft tissues. The subcutaneous tissues were then dissected using a combination of blunt and sharp surgical technique and a pocket was formed. A single lumen power injectable portacatheter was then tunneled through the subcutaneous tissues from the pocket to the dermatotomy and the port reservoir placed within the subcutaneous pocket. The venous access site was then serially dilated and a peel away vascular sheath placed over the wire. The wire was removed and the port catheter advanced into position under fluoroscopic guidance. The catheter tip is positioned in the superior cavoatrial junction. This  was documented with a spot image. The portacatheter was then tested and found to flush and aspirate well. The port was flushed with saline followed by 100 units/mL heparinized saline. The pocket was then closed in two layers using first subdermal inverted interrupted absorbable sutures followed by a running subcuticular suture. The epidermis was then sealed with Dermabond. The dermatotomy at the venous access site was also closed with Dermabond. IMPRESSION: Successful placement of a right IJ approach Power Port with ultrasound and fluoroscopic guidance. The catheter is ready for use. Electronically Signed   By: Jacqulynn Cadet M.D.   On: 02/16/2020 17:40    ASSESSMENT AND PLAN: This is a very pleasant 74 years old white female with a stage IV (T3, N2, M1a) non-small cell lung cancer, poorly differentiated adenocarcinoma diagnosed in June 2021 and presented with large left upper lobe lung mass with mediastinal invasion in addition to mediastinal lymphadenopathy as well as bilateral pulmonary nodules. The patient has no actionable mutations but PD-L1 expression is 100%. Because of the bulky disease I recommended for the patient to consider the combination chemotherapy.  She is interested in this option. She is currently  being treated with carboplatin for AUC of 5, Alimta 500 mg/M2 and Keytruda 200 mg IV every 3 weeks status post 1 cycle.  She tolerated the first cycle of her treatment well with no concerning adverse effects. I recommended for the patient to proceed with cycle #2 today as planned. She will come back for follow-up visit in 3 weeks for evaluation before the next cycle of her treatment. The patient will continue with the palliative radiotherapy to the left upper lobe lung mass under the care of Dr. Sondra Come. She was advised to call immediately if she has any concerning symptoms in the interval. The patient voices understanding of current disease status and treatment options and is in agreement  with the current care plan. All questions were answered. The patient knows to call the clinic with any problems, questions or concerns. We can certainly see the patient much sooner if necessary.  Disclaimer: This note was dictated with voice recognition software. Similar sounding words can inadvertently be transcribed and may not be corrected upon review.

## 2020-02-27 ENCOUNTER — Ambulatory Visit
Admission: RE | Admit: 2020-02-27 | Discharge: 2020-02-27 | Disposition: A | Discharge status: Home / Self Care | Payer: Medicare Other | Source: Ambulatory Visit | Attending: Radiation Oncology | Admitting: Radiation Oncology

## 2020-02-27 DIAGNOSIS — C3412 Malignant neoplasm of upper lobe, left bronchus or lung: Secondary | ICD-10-CM | POA: Diagnosis not present

## 2020-03-01 ENCOUNTER — Ambulatory Visit
Admission: RE | Admit: 2020-03-01 | Discharge: 2020-03-01 | Disposition: A | Discharge status: Home / Self Care | Payer: Medicare Other | Source: Ambulatory Visit | Attending: Radiation Oncology | Admitting: Radiation Oncology

## 2020-03-01 DIAGNOSIS — C3412 Malignant neoplasm of upper lobe, left bronchus or lung: Secondary | ICD-10-CM | POA: Diagnosis not present

## 2020-03-02 ENCOUNTER — Ambulatory Visit
Admission: RE | Admit: 2020-03-02 | Discharge: 2020-03-02 | Disposition: A | Discharge status: Home / Self Care | Payer: Medicare Other | Source: Ambulatory Visit | Attending: Radiation Oncology | Admitting: Radiation Oncology

## 2020-03-02 DIAGNOSIS — C3412 Malignant neoplasm of upper lobe, left bronchus or lung: Secondary | ICD-10-CM | POA: Diagnosis not present

## 2020-03-03 ENCOUNTER — Ambulatory Visit
Admission: RE | Admit: 2020-03-03 | Discharge: 2020-03-03 | Disposition: A | Discharge status: Home / Self Care | Payer: Medicare Other | Source: Ambulatory Visit | Attending: Radiation Oncology | Admitting: Radiation Oncology

## 2020-03-03 DIAGNOSIS — C3412 Malignant neoplasm of upper lobe, left bronchus or lung: Secondary | ICD-10-CM | POA: Diagnosis not present

## 2020-03-04 ENCOUNTER — Inpatient Hospital Stay: Payer: Medicare Other

## 2020-03-04 ENCOUNTER — Other Ambulatory Visit: Payer: Self-pay

## 2020-03-04 ENCOUNTER — Ambulatory Visit
Admission: RE | Admit: 2020-03-04 | Discharge: 2020-03-04 | Disposition: A | Discharge status: Home / Self Care | Payer: Medicare Other | Source: Ambulatory Visit | Attending: Radiation Oncology | Admitting: Radiation Oncology

## 2020-03-04 DIAGNOSIS — I959 Hypotension, unspecified: Secondary | ICD-10-CM | POA: Diagnosis not present

## 2020-03-04 DIAGNOSIS — C3492 Malignant neoplasm of unspecified part of left bronchus or lung: Secondary | ICD-10-CM

## 2020-03-04 DIAGNOSIS — R590 Localized enlarged lymph nodes: Secondary | ICD-10-CM | POA: Diagnosis not present

## 2020-03-04 DIAGNOSIS — Z95828 Presence of other vascular implants and grafts: Secondary | ICD-10-CM

## 2020-03-04 DIAGNOSIS — R000 Tachycardia, unspecified: Secondary | ICD-10-CM | POA: Diagnosis not present

## 2020-03-04 DIAGNOSIS — C3412 Malignant neoplasm of upper lobe, left bronchus or lung: Secondary | ICD-10-CM | POA: Diagnosis not present

## 2020-03-04 DIAGNOSIS — F17210 Nicotine dependence, cigarettes, uncomplicated: Secondary | ICD-10-CM | POA: Diagnosis not present

## 2020-03-04 DIAGNOSIS — Z5112 Encounter for antineoplastic immunotherapy: Secondary | ICD-10-CM | POA: Diagnosis not present

## 2020-03-04 LAB — CBC WITH DIFFERENTIAL (CANCER CENTER ONLY)
Abs Immature Granulocytes: 0.01 10*3/uL (ref 0.00–0.07)
Basophils Absolute: 0 10*3/uL (ref 0.0–0.1)
Basophils Relative: 1 %
Eosinophils Absolute: 0.4 10*3/uL (ref 0.0–0.5)
Eosinophils Relative: 10 %
HCT: 32.3 % — ABNORMAL LOW (ref 36.0–46.0)
Hemoglobin: 10.3 g/dL — ABNORMAL LOW (ref 12.0–15.0)
Immature Granulocytes: 0 %
Lymphocytes Relative: 25 %
Lymphs Abs: 0.9 10*3/uL (ref 0.7–4.0)
MCH: 28 pg (ref 26.0–34.0)
MCHC: 31.9 g/dL (ref 30.0–36.0)
MCV: 87.8 fL (ref 80.0–100.0)
Monocytes Absolute: 0.8 10*3/uL (ref 0.1–1.0)
Monocytes Relative: 21 %
Neutro Abs: 1.6 10*3/uL — ABNORMAL LOW (ref 1.7–7.7)
Neutrophils Relative %: 43 %
Platelet Count: 135 10*3/uL — ABNORMAL LOW (ref 150–400)
RBC: 3.68 MIL/uL — ABNORMAL LOW (ref 3.87–5.11)
RDW: 14.6 % (ref 11.5–15.5)
WBC Count: 3.7 10*3/uL — ABNORMAL LOW (ref 4.0–10.5)
nRBC: 0 % (ref 0.0–0.2)

## 2020-03-04 LAB — CMP (CANCER CENTER ONLY)
ALT: 12 U/L (ref 0–44)
AST: 15 U/L (ref 15–41)
Albumin: 3 g/dL — ABNORMAL LOW (ref 3.5–5.0)
Alkaline Phosphatase: 61 U/L (ref 38–126)
Anion gap: 9 (ref 5–15)
BUN: 19 mg/dL (ref 8–23)
CO2: 27 mmol/L (ref 22–32)
Calcium: 8.5 mg/dL — ABNORMAL LOW (ref 8.9–10.3)
Chloride: 105 mmol/L (ref 98–111)
Creatinine: 0.85 mg/dL (ref 0.44–1.00)
GFR, Est AFR Am: >60 mL/min (ref >60)
GFR, Estimated: >60 mL/min (ref >60)
Glucose, Bld: 83 mg/dL (ref 70–99)
Potassium: 4.1 mmol/L (ref 3.5–5.1)
Sodium: 141 mmol/L (ref 135–145)
Total Bilirubin: 0.3 mg/dL (ref 0.3–1.2)
Total Protein: 6.4 g/dL — ABNORMAL LOW (ref 6.5–8.1)

## 2020-03-04 MED ORDER — HEPARIN SOD (PORK) LOCK FLUSH 100 UNIT/ML IV SOLN
500.0000 [IU] | Freq: Once | INTRAVENOUS | Status: AC
  Administered 2020-03-04: 500 [IU]
  Filled 2020-03-04: qty 5

## 2020-03-04 MED ORDER — SODIUM CHLORIDE 0.9% FLUSH
10.0000 mL | Freq: Once | INTRAVENOUS | Status: AC
  Administered 2020-03-04: 10 mL
  Filled 2020-03-04: qty 10

## 2020-03-04 NOTE — Patient Instructions (Signed)

## 2020-03-05 ENCOUNTER — Ambulatory Visit
Admission: RE | Admit: 2020-03-05 | Discharge: 2020-03-05 | Disposition: A | Discharge status: Home / Self Care | Payer: Medicare Other | Source: Ambulatory Visit | Attending: Radiation Oncology | Admitting: Radiation Oncology

## 2020-03-05 ENCOUNTER — Other Ambulatory Visit: Payer: Self-pay

## 2020-03-05 DIAGNOSIS — C3412 Malignant neoplasm of upper lobe, left bronchus or lung: Secondary | ICD-10-CM | POA: Diagnosis not present

## 2020-03-06 ENCOUNTER — Encounter: Payer: Self-pay | Admitting: Radiation Oncology

## 2020-03-08 ENCOUNTER — Other Ambulatory Visit: Payer: Self-pay

## 2020-03-08 ENCOUNTER — Ambulatory Visit
Admission: RE | Admit: 2020-03-08 | Discharge: 2020-03-08 | Disposition: A | Discharge status: Home / Self Care | Payer: Medicare Other | Source: Ambulatory Visit | Attending: Radiation Oncology | Admitting: Radiation Oncology

## 2020-03-08 DIAGNOSIS — C3412 Malignant neoplasm of upper lobe, left bronchus or lung: Secondary | ICD-10-CM | POA: Diagnosis not present

## 2020-03-09 ENCOUNTER — Ambulatory Visit
Admission: RE | Admit: 2020-03-09 | Discharge: 2020-03-09 | Disposition: A | Discharge status: Home / Self Care | Payer: Medicare Other | Source: Ambulatory Visit | Attending: Radiation Oncology | Admitting: Radiation Oncology

## 2020-03-09 ENCOUNTER — Other Ambulatory Visit: Payer: Self-pay

## 2020-03-09 DIAGNOSIS — C3412 Malignant neoplasm of upper lobe, left bronchus or lung: Secondary | ICD-10-CM | POA: Diagnosis not present

## 2020-03-10 DIAGNOSIS — J849 Interstitial pulmonary disease, unspecified: Secondary | ICD-10-CM | POA: Diagnosis not present

## 2020-03-10 DIAGNOSIS — J449 Chronic obstructive pulmonary disease, unspecified: Secondary | ICD-10-CM | POA: Diagnosis not present

## 2020-03-10 DIAGNOSIS — J432 Centrilobular emphysema: Secondary | ICD-10-CM | POA: Diagnosis not present

## 2020-03-11 ENCOUNTER — Other Ambulatory Visit: Payer: Self-pay

## 2020-03-11 ENCOUNTER — Inpatient Hospital Stay: Payer: Medicare Other

## 2020-03-11 ENCOUNTER — Encounter: Payer: Self-pay | Admitting: Internal Medicine

## 2020-03-11 ENCOUNTER — Inpatient Hospital Stay: Payer: Medicare Other | Admitting: Internal Medicine

## 2020-03-11 VITALS — BP 85/54 | HR 103 | Temp 97.7°F | Resp 20 | Wt 142.0 lb

## 2020-03-11 VITALS — BP 99/56 | HR 99

## 2020-03-11 DIAGNOSIS — Z5111 Encounter for antineoplastic chemotherapy: Secondary | ICD-10-CM | POA: Diagnosis not present

## 2020-03-11 DIAGNOSIS — Z5112 Encounter for antineoplastic immunotherapy: Secondary | ICD-10-CM

## 2020-03-11 DIAGNOSIS — C3412 Malignant neoplasm of upper lobe, left bronchus or lung: Secondary | ICD-10-CM | POA: Diagnosis not present

## 2020-03-11 DIAGNOSIS — R000 Tachycardia, unspecified: Secondary | ICD-10-CM | POA: Diagnosis not present

## 2020-03-11 DIAGNOSIS — F17210 Nicotine dependence, cigarettes, uncomplicated: Secondary | ICD-10-CM | POA: Diagnosis not present

## 2020-03-11 DIAGNOSIS — C3492 Malignant neoplasm of unspecified part of left bronchus or lung: Secondary | ICD-10-CM

## 2020-03-11 DIAGNOSIS — R590 Localized enlarged lymph nodes: Secondary | ICD-10-CM | POA: Diagnosis not present

## 2020-03-11 DIAGNOSIS — I959 Hypotension, unspecified: Secondary | ICD-10-CM | POA: Diagnosis not present

## 2020-03-11 LAB — CBC WITH DIFFERENTIAL (CANCER CENTER ONLY)
Abs Immature Granulocytes: 0.06 10*3/uL (ref 0.00–0.07)
Basophils Absolute: 0.1 10*3/uL (ref 0.0–0.1)
Basophils Relative: 2 %
Eosinophils Absolute: 0.2 10*3/uL (ref 0.0–0.5)
Eosinophils Relative: 4 %
HCT: 33.2 % — ABNORMAL LOW (ref 36.0–46.0)
Hemoglobin: 10.7 g/dL — ABNORMAL LOW (ref 12.0–15.0)
Immature Granulocytes: 1 %
Lymphocytes Relative: 19 %
Lymphs Abs: 0.9 10*3/uL (ref 0.7–4.0)
MCH: 28.6 pg (ref 26.0–34.0)
MCHC: 32.2 g/dL (ref 30.0–36.0)
MCV: 88.8 fL (ref 80.0–100.0)
Monocytes Absolute: 0.7 10*3/uL (ref 0.1–1.0)
Monocytes Relative: 15 %
Neutro Abs: 2.8 10*3/uL (ref 1.7–7.7)
Neutrophils Relative %: 59 %
Platelet Count: 295 10*3/uL (ref 150–400)
RBC: 3.74 MIL/uL — ABNORMAL LOW (ref 3.87–5.11)
RDW: 15.5 % (ref 11.5–15.5)
WBC Count: 4.8 10*3/uL (ref 4.0–10.5)
nRBC: 0 % (ref 0.0–0.2)

## 2020-03-11 LAB — TSH: TSH: 0.417 u[IU]/mL (ref 0.308–3.960)

## 2020-03-11 LAB — CMP (CANCER CENTER ONLY)
ALT: 7 U/L (ref 0–44)
AST: 13 U/L — ABNORMAL LOW (ref 15–41)
Albumin: 3.1 g/dL — ABNORMAL LOW (ref 3.5–5.0)
Alkaline Phosphatase: 65 U/L (ref 38–126)
Anion gap: 7 (ref 5–15)
BUN: 26 mg/dL — ABNORMAL HIGH (ref 8–23)
CO2: 29 mmol/L (ref 22–32)
Calcium: 9.4 mg/dL (ref 8.9–10.3)
Chloride: 104 mmol/L (ref 98–111)
Creatinine: 1.07 mg/dL — ABNORMAL HIGH (ref 0.44–1.00)
GFR, Est AFR Am: 60 mL/min — ABNORMAL LOW (ref >60)
GFR, Estimated: 51 mL/min — ABNORMAL LOW (ref >60)
Glucose, Bld: 137 mg/dL — ABNORMAL HIGH (ref 70–99)
Potassium: 3.9 mmol/L (ref 3.5–5.1)
Sodium: 140 mmol/L (ref 135–145)
Total Bilirubin: <0.2 mg/dL — ABNORMAL LOW (ref 0.3–1.2)
Total Protein: 6.7 g/dL (ref 6.5–8.1)

## 2020-03-11 MED ORDER — SODIUM CHLORIDE 0.9 % IV SOLN
500.0000 mg/m2 | Freq: Once | INTRAVENOUS | Status: AC
  Administered 2020-03-11: 900 mg via INTRAVENOUS
  Filled 2020-03-11: qty 16

## 2020-03-11 MED ORDER — PALONOSETRON HCL INJECTION 0.25 MG/5ML
INTRAVENOUS | Status: AC
  Filled 2020-03-11: qty 5

## 2020-03-11 MED ORDER — SODIUM CHLORIDE 0.9% FLUSH
10.0000 mL | INTRAVENOUS | Status: DC | PRN
  Administered 2020-03-11: 10 mL
  Filled 2020-03-11: qty 10

## 2020-03-11 MED ORDER — SODIUM CHLORIDE 0.9 % IV SOLN
150.0000 mg | Freq: Once | INTRAVENOUS | Status: AC
  Administered 2020-03-11: 150 mg via INTRAVENOUS
  Filled 2020-03-11: qty 150

## 2020-03-11 MED ORDER — SODIUM CHLORIDE 0.9 % IV SOLN
Freq: Once | INTRAVENOUS | Status: AC
  Filled 2020-03-11: qty 250

## 2020-03-11 MED ORDER — HEPARIN SOD (PORK) LOCK FLUSH 100 UNIT/ML IV SOLN
500.0000 [IU] | Freq: Once | INTRAVENOUS | Status: AC | PRN
  Administered 2020-03-11: 500 [IU]
  Filled 2020-03-11: qty 5

## 2020-03-11 MED ORDER — SODIUM CHLORIDE 0.9 % IV SOLN
10.0000 mg | Freq: Once | INTRAVENOUS | Status: AC
  Administered 2020-03-11: 10 mg via INTRAVENOUS
  Filled 2020-03-11: qty 10

## 2020-03-11 MED ORDER — SODIUM CHLORIDE 0.9 % IV SOLN
200.0000 mg | Freq: Once | INTRAVENOUS | Status: AC
  Administered 2020-03-11: 200 mg via INTRAVENOUS
  Filled 2020-03-11: qty 8

## 2020-03-11 MED ORDER — PALONOSETRON HCL INJECTION 0.25 MG/5ML
0.2500 mg | Freq: Once | INTRAVENOUS | Status: AC
  Administered 2020-03-11: 0.25 mg via INTRAVENOUS

## 2020-03-11 MED ORDER — SODIUM CHLORIDE 0.9 % IV SOLN
INTRAVENOUS | Status: AC
  Filled 2020-03-11: qty 250

## 2020-03-11 MED ORDER — SODIUM CHLORIDE 0.9 % IV SOLN
382.5000 mg | Freq: Once | INTRAVENOUS | Status: AC
  Administered 2020-03-11: 380 mg via INTRAVENOUS
  Filled 2020-03-11: qty 38

## 2020-03-11 NOTE — Progress Notes (Signed)
[place] Telephone:(336) [phone]   Fax:(336) [phone]  OFFICE PROGRESS NOTE  Celene Squibb, MD [address] [place] [address]  DIAGNOSIS: Stage IV (T3, N2, M1a) non-small cell lung cancer, poorly differentiated adenocarcinoma presented with large left upper lobe lung mass in addition to mediastinal lymphadenopathy and bilateral pulmonary nodules diagnosed in June 2021.  Biomarker Findings Tumor Mutational Burden - 10 Muts/Mb Microsatellite status - MS-Stable Genomic Findings For a complete list of the genes assayed, please refer to the Appendix. NF1 Q2582* KRAS G12V CTNNB1 A679fs*10 TP53 H179N 7 Disease relevant genes with no reportable alterations: ALK, BRAF, EGFR, ERBB2, MET, RET, ROS1  PDL1 Expression: 100%  PRIOR THERAPY: Palliative radiotherapy to the large left upper lobe lung mass.  CURRENT THERAPY: Systemic chemotherapy with carboplatin for AUC of 5, Alimta 500 mg/M2 and Keytruda 200 mg IV every 3 weeks.  First dose 02/12/2020.  Status post 1 cycle.  INTERVAL HISTORY: Kayla Sawyer 74 y.o. female returns to the clinic today for follow-up visit.  The patient completed a course of palliative radiotherapy to the large left upper lobe lung mass.  She denied having any current chest pain but continues to have shortness of breath at baseline increased with exertion and she is currently on home oxygen.  She denied having any weight loss or night sweats.  She has no nausea, vomiting, diarrhea or constipation.  She has no headache or visual changes.  She tolerated the first cycle of her treatment fairly well.  She is here today for evaluation before starting cycle #2.  MEDICAL HISTORY: Past Medical History:  Diagnosis Date  . Arthritis   . COPD (chronic obstructive pulmonary disease) ([place])   . Depression   . E. coli pyelonephritis 6//2014  . GERD (gastroesophageal reflux disease)   . Pneumonia   . Pulmonary hypertension ([place])   . Sepsis ([place])      ALLERGIES:  is allergic to sulfur.  MEDICATIONS:  Current Outpatient Medications  Medication Sig Dispense Refill  . acetaminophen (TYLENOL) 500 MG tablet Take 500 mg by mouth every 6 (six) hours as needed for mild pain.    Marland Kitchen albuterol (PROVENTIL) (2.5 MG/3ML) 0.083% nebulizer solution Take 3 mLs (2.5 mg total) by nebulization every 6 (six) hours as needed for wheezing or shortness of breath. 75 mL 12  . aspirin EC 81 MG tablet Take 1 tablet (81 mg total) by mouth daily.    Marland Kitchen atorvastatin (LIPITOR) 40 MG tablet Take 40 mg by mouth daily.    . folic acid (FOLVITE) 1 MG tablet Take 1 tablet (1 mg total) by mouth daily. 30 tablet 4  . levocetirizine (XYZAL) 5 MG tablet Take 5 mg by mouth every evening.     . lidocaine-prilocaine (EMLA) cream Apply to the Port-A-Cath site 30-60-minute before chemotherapy every 3 weeks. 30 g 0  . Multiple Vitamin (MULTIVITAMIN WITH MINERALS) TABS tablet Take 1 tablet by mouth daily.    Marland Kitchen omeprazole (PRILOSEC) 20 MG capsule Take 20 mg by mouth daily.     . prochlorperazine (COMPAZINE) 10 MG tablet Take 1 tablet (10 mg total) by mouth every 6 (six) hours as needed for nausea or vomiting. 30 tablet 0  . sertraline (ZOLOFT) 100 MG tablet Take 100 mg by mouth daily.     . TRELEGY ELLIPTA 100-62.5-25 MCG/INH AEPB Inhale 1 puff into the lungs daily.     No current facility-administered medications for this visit.    SURGICAL  HISTORY:  Past Surgical History:  Procedure Laterality Date  . ABDOMINAL HYSTERECTOMY     partial  . AORTA - BILATERAL FEMORAL ARTERY BYPASS GRAFT Bilateral 06/14/2016   Procedure: AORTOBIFEMORAL BYPASS GRAFT AORTA SMA BYPASS GRAFT RE-IMPLANTATION  OF SUPERIOR MESENTERIC ARTERY;  Surgeon: Serafina Mitchell, MD;  Location: [facility];  Service: Vascular;  Laterality: Bilateral;  . BACK SURGERY     L4/5  . CARDIAC CATHETERIZATION N/A 01/03/2016   Procedure: Right/Left Heart Cath and Coronary Angiography;  Surgeon: Jolaine Artist, MD;  Location:  [facility];  Service: Cardiovascular;  Laterality: N/A;  . COLONOSCOPY N/A 08/05/2015   Procedure: COLONOSCOPY;  Surgeon: Rogene Houston, MD;  [facility];  Service: Endoscopy;  Laterality: N/A;  730  . ELBOW SURGERY    . IR IMAGING GUIDED PORT INSERTION  02/16/2020  . PERIPHERAL VASCULAR CATHETERIZATION N/A 02/16/2016   Procedure: Abdominal Aortogram w/Lower Extremity;  Surgeon: Wellington Hampshire, MD;  Location: [facility];  Service: Cardiovascular;  Laterality: N/A;    REVIEW OF SYSTEMS:  A comprehensive review of systems was negative except for: Constitutional: positive for fatigue Respiratory: positive for dyspnea on exertion   PHYSICAL EXAMINATION: General appearance: alert, cooperative, fatigued and no distress Head: Normocephalic, without obvious abnormality, atraumatic Neck: no adenopathy, no JVD, supple, symmetrical, trachea midline and thyroid not enlarged, symmetric, no tenderness/mass/nodules Lymph nodes: Cervical, supraclavicular, and axillary nodes normal. Resp: clear to auscultation bilaterally Back: symmetric, no curvature. ROM normal. No CVA tenderness. Cardio: regular rate and rhythm, S1, S2 normal, no murmur, click, rub or gallop GI: soft, non-tender; bowel sounds normal; no masses,  no organomegaly Extremities: extremities normal, atraumatic, no cyanosis or edema  ECOG PERFORMANCE STATUS: 1 - Symptomatic but completely ambulatory  Blood pressure (!) 85/54, pulse (!) 103, temperature 97.7 F (36.5 C), temperature source Tympanic, resp. rate 20, weight 142 lb (64.4 kg), SpO2 97 %.  LABORATORY DATA: Lab Results  Component Value Date   WBC 4.8 03/11/2020   HGB 10.7 (L) 03/11/2020   HCT 33.2 (L) 03/11/2020   MCV 88.8 03/11/2020   PLT 295 03/11/2020      Chemistry      Component Value Date/Time   NA 141 03/04/2020 1154   K 4.1 03/04/2020 1154   CL 105 03/04/2020 1154   CO2 27 03/04/2020 1154   BUN 19 03/04/2020 1154   CREATININE 0.85  03/04/2020 1154   CREATININE 0.90 02/08/2016 0949      Component Value Date/Time   CALCIUM 8.5 (L) 03/04/2020 1154   ALKPHOS 61 03/04/2020 1154   AST 15 03/04/2020 1154   ALT 12 03/04/2020 1154   BILITOT 0.3 03/04/2020 1154       RADIOGRAPHIC STUDIES: IR IMAGING GUIDED PORT INSERTION  Result Date: 02/16/2020 INDICATION: 74 year old female with stage IV adenocarcinoma of the left lung. She requires durable venous access for chemotherapy and presents for portacatheter placement. EXAM: IMPLANTED PORT A CATH PLACEMENT WITH ULTRASOUND AND FLUOROSCOPIC GUIDANCE MEDICATIONS: 2 g Ancef; The antibiotic was administered within an appropriate time interval prior to skin puncture. ANESTHESIA/SEDATION: Versed 1 mg IV; Fentanyl 50 mcg IV; Moderate Sedation Time:  24 minutes The patient was continuously monitored during the procedure by the interventional radiology nurse under my direct supervision. FLUOROSCOPY TIME:  0 minutes, 12 seconds (1 mGy) COMPLICATIONS: None immediate. PROCEDURE: The right neck and chest was prepped with chlorhexidine, and draped in the usual sterile fashion using maximum barrier technique (cap and mask, sterile  gown, sterile gloves, large sterile sheet, hand hygiene and cutaneous antiseptic). Local anesthesia was attained by infiltration with 1% lidocaine with epinephrine. Ultrasound demonstrated patency of the right internal jugular vein, and this was documented with an image. Under real-time ultrasound guidance, this vein was accessed with a 21 gauge micropuncture needle and image documentation was performed. A small dermatotomy was made at the access site with an 11 scalpel. A 0.018" wire was advanced into the SVC and the access needle exchanged for a 27F micropuncture vascular sheath. The 0.018" wire was then removed and a 0.035" wire advanced into the IVC. An appropriate location for the subcutaneous reservoir was selected below the clavicle and an incision was made through the skin  and underlying soft tissues. The subcutaneous tissues were then dissected using a combination of blunt and sharp surgical technique and a pocket was formed. A single lumen power injectable portacatheter was then tunneled through the subcutaneous tissues from the pocket to the dermatotomy and the port reservoir placed within the subcutaneous pocket. The venous access site was then serially dilated and a peel away vascular sheath placed over the wire. The wire was removed and the port catheter advanced into position under fluoroscopic guidance. The catheter tip is positioned in the superior cavoatrial junction. This was documented with a spot image. The portacatheter was then tested and found to flush and aspirate well. The port was flushed with saline followed by 100 units/mL heparinized saline. The pocket was then closed in two layers using first subdermal inverted interrupted absorbable sutures followed by a running subcuticular suture. The epidermis was then sealed with Dermabond. The dermatotomy at the venous access site was also closed with Dermabond. IMPRESSION: Successful placement of a right IJ approach Power Port with ultrasound and fluoroscopic guidance. The catheter is ready for use. Electronically Signed   By: Jacqulynn Cadet M.D.   On: 02/16/2020 17:40    ASSESSMENT AND PLAN: This is a very pleasant 74 years old white female with a stage IV (T3, N2, M1a) non-small cell lung cancer, poorly differentiated adenocarcinoma diagnosed in June 2021 and presented with large left upper lobe lung mass with mediastinal invasion in addition to mediastinal lymphadenopathy as well as bilateral pulmonary nodules. The patient has no actionable mutations but PD-L1 expression is 100%. Because of the bulky disease I recommended for the patient to consider the combination chemotherapy.  She is interested in this option. She is currently being treated with carboplatin for AUC of 5, Alimta 500 mg/M2 and Keytruda 200 mg  IV every 3 weeks status post 1 cycle.   The patient tolerated the first cycle of her treatment well with no concerning adverse effects. I recommended for her to proceed with cycle #2 today as planned. She will come back for follow-up visit in 3 weeks for evaluation with the start of cycle #3. For the hypotension and tachycardia, I will arrange for the patient to receive 500 cc of normal saline in the clinic today. She was advised to call immediately if she has any concerning symptoms in the interval. The patient voices understanding of current disease status and treatment options and is in agreement with the current care plan. All questions were answered. The patient knows to call the clinic with any problems, questions or concerns. We can certainly see the patient much sooner if necessary.  Disclaimer: This note was dictated with voice recognition software. Similar sounding words can inadvertently be transcribed and may not be corrected upon review.

## 2020-03-11 NOTE — Patient Instructions (Signed)
Steps to Quit Smoking Smoking tobacco is the leading cause of preventable death. It can affect almost every organ in the body. Smoking puts you and people around you at risk for many serious, long-lasting (chronic) diseases. Quitting smoking can be hard, but it is one of the best things that you can do for your health. It is never too late to quit. How do I get ready to quit? When you decide to quit smoking, make a plan to help you succeed. Before you quit:  Pick a date to quit. Set a date within the next 2 weeks to give you time to prepare.  Write down the reasons why you are quitting. Keep this list in places where you will see it often.  Tell your family, friends, and co-workers that you are quitting. Their support is important.  Talk with your doctor about the choices that may help you quit.  Find out if your health insurance will pay for these treatments.  Know the people, places, things, and activities that make you want to smoke (triggers). Avoid them. What first steps can I take to quit smoking?  Throw away all cigarettes at home, at work, and in your car.  Throw away the things that you use when you smoke, such as ashtrays and lighters.  Clean your car. Make sure to empty the ashtray.  Clean your home, including curtains and carpets. What can I do to help me quit smoking? Talk with your doctor about taking medicines and seeing a counselor at the same time. You are more likely to succeed when you do both.  If you are pregnant or breastfeeding, talk with your doctor about counseling or other ways to quit smoking. Do not take medicine to help you quit smoking unless your doctor tells you to do so. To quit smoking: Quit right away  Quit smoking totally, instead of slowly cutting back on how much you smoke over a period of time.  Go to counseling. You are more likely to quit if you go to counseling sessions regularly. Take medicine You may take medicines to help you quit. Some  medicines need a prescription, and some you can buy over-the-counter. Some medicines may contain a drug called nicotine to replace the nicotine in cigarettes. Medicines may:  Help you to stop having the desire to smoke (cravings).  Help to stop the problems that come when you stop smoking (withdrawal symptoms). Your doctor may ask you to use:  Nicotine patches, gum, or lozenges.  Nicotine inhalers or sprays.  Non-nicotine medicine that is taken by mouth. Find resources Find resources and other ways to help you quit smoking and remain smoke-free after you quit. These resources are most helpful when you use them often. They include:  Online chats with a counselor.  Phone quitlines.  Printed self-help materials.  Support groups or group counseling.  Text messaging programs.  Mobile phone apps. Use apps on your mobile phone or tablet that can help you stick to your quit plan. There are many free apps for mobile phones and tablets as well as websites. Examples include Quit Guide from the CDC and smokefree.gov  What things can I do to make it easier to quit?   Talk to your family and friends. Ask them to support and encourage you.  Call a phone quitline (1-800-QUIT-NOW), reach out to support groups, or work with a counselor.  Ask people who smoke to not smoke around you.  Avoid places that make you want to smoke,   such as: ? Bars. ? Parties. ? Smoke-break areas at work.  Spend time with people who do not smoke.  Lower the stress in your life. Stress can make you want to smoke. Try these things to help your stress: ? Getting regular exercise. ? Doing deep-breathing exercises. ? Doing yoga. ? Meditating. ? Doing a body scan. To do this, close your eyes, focus on one area of your body at a time from head to toe. Notice which parts of your body are tense. Try to relax the muscles in those areas. How will I feel when I quit smoking? Day 1 to 3 weeks Within the first 24 hours,  you may start to have some problems that come from quitting tobacco. These problems are very bad 2-3 days after you quit, but they do not often last for more than 2-3 weeks. You may get these symptoms:  Mood swings.  Feeling restless, nervous, angry, or annoyed.  Trouble concentrating.  Dizziness.  Strong desire for high-sugar foods and nicotine.  Weight gain.  Trouble pooping (constipation).  Feeling like you may vomit (nausea).  Coughing or a sore throat.  Changes in how the medicines that you take for other issues work in your body.  Depression.  Trouble sleeping (insomnia). Week 3 and afterward After the first 2-3 weeks of quitting, you may start to notice more positive results, such as:  Better sense of smell and taste.  Less coughing and sore throat.  Slower heart rate.  Lower blood pressure.  Clearer skin.  Better breathing.  Fewer sick days. Quitting smoking can be hard. Do not give up if you fail the first time. Some people need to try a few times before they succeed. Do your best to stick to your quit plan, and talk with your doctor if you have any questions or concerns. Summary  Smoking tobacco is the leading cause of preventable death. Quitting smoking can be hard, but it is one of the best things that you can do for your health.  When you decide to quit smoking, make a plan to help you succeed.  Quit smoking right away, not slowly over a period of time.  When you start quitting, seek help from your doctor, family, or friends. This information is not intended to replace advice given to you by your health care provider. Make sure you discuss any questions you have with your health care provider. Document Revised: 03/28/2019 Document Reviewed: 09/21/2018 Elsevier Patient Education  2020 [employer]  

## 2020-03-12 ENCOUNTER — Telehealth: Payer: Self-pay | Admitting: Internal Medicine

## 2020-03-12 NOTE — Telephone Encounter (Signed)
Scheduled per los. Called and left msg. Mailed printout  °

## 2020-03-15 ENCOUNTER — Encounter: Payer: Self-pay | Admitting: *Deleted

## 2020-03-15 NOTE — Progress Notes (Signed)
Oncology Nurse Navigator Documentation  Oncology Nurse Navigator Flowsheets 03/15/2020  Abnormal Finding Date 12/18/2019  Confirmed Diagnosis Date 01/12/2020  Diagnosis Status Confirmed Diagnosis Complete  Planned Course of Treatment Chemotherapy;Targeted Therapy  Phase of Treatment Radiation  Chemotherapy Actual Start Date: 02/19/2020  Radiation Actual Start Date: 02/12/2020  Targeted Therapy Actual Start Date: 02/19/2020  Navigator Follow Up Date: 03/15/2020  Navigator Follow Up Reason: Appointment Review  Navigator Location [facility]  Navigator Encounter Type Other:  Treatment Initiated Date 02/12/2020  Patient Visit Type Other  Treatment Phase Treatment  Barriers/Navigation Needs Coordination of Care  Interventions Coordination of Care  Acuity Level 2-Minimal Needs (1-2 Barriers Identified)  Time Spent with Patient 15

## 2020-03-16 DIAGNOSIS — J449 Chronic obstructive pulmonary disease, unspecified: Secondary | ICD-10-CM | POA: Diagnosis not present

## 2020-03-18 ENCOUNTER — Other Ambulatory Visit: Payer: Self-pay

## 2020-03-18 ENCOUNTER — Inpatient Hospital Stay: Payer: Medicare Other | Attending: Internal Medicine

## 2020-03-18 DIAGNOSIS — Z5111 Encounter for antineoplastic chemotherapy: Secondary | ICD-10-CM | POA: Insufficient documentation

## 2020-03-18 DIAGNOSIS — Z79899 Other long term (current) drug therapy: Secondary | ICD-10-CM | POA: Diagnosis not present

## 2020-03-18 DIAGNOSIS — C3492 Malignant neoplasm of unspecified part of left bronchus or lung: Secondary | ICD-10-CM

## 2020-03-18 DIAGNOSIS — Z5112 Encounter for antineoplastic immunotherapy: Secondary | ICD-10-CM | POA: Insufficient documentation

## 2020-03-18 DIAGNOSIS — C3412 Malignant neoplasm of upper lobe, left bronchus or lung: Secondary | ICD-10-CM | POA: Diagnosis not present

## 2020-03-18 LAB — CMP (CANCER CENTER ONLY)
ALT: 11 U/L (ref 0–44)
AST: 15 U/L (ref 15–41)
Albumin: 3.2 g/dL — ABNORMAL LOW (ref 3.5–5.0)
Alkaline Phosphatase: 60 U/L (ref 38–126)
Anion gap: 8 (ref 5–15)
BUN: 31 mg/dL — ABNORMAL HIGH (ref 8–23)
CO2: 28 mmol/L (ref 22–32)
Calcium: 8.5 mg/dL — ABNORMAL LOW (ref 8.9–10.3)
Chloride: 106 mmol/L (ref 98–111)
Creatinine: 1.07 mg/dL — ABNORMAL HIGH (ref 0.44–1.00)
GFR, Est AFR Am: 60 mL/min — ABNORMAL LOW (ref >60)
GFR, Estimated: 51 mL/min — ABNORMAL LOW (ref >60)
Glucose, Bld: 123 mg/dL — ABNORMAL HIGH (ref 70–99)
Potassium: 4 mmol/L (ref 3.5–5.1)
Sodium: 142 mmol/L (ref 135–145)
Total Bilirubin: 0.3 mg/dL (ref 0.3–1.2)
Total Protein: 6.6 g/dL (ref 6.5–8.1)

## 2020-03-18 LAB — CBC WITH DIFFERENTIAL (CANCER CENTER ONLY)
Abs Immature Granulocytes: 0.02 10*3/uL (ref 0.00–0.07)
Basophils Absolute: 0 10*3/uL (ref 0.0–0.1)
Basophils Relative: 1 %
Eosinophils Absolute: 0.1 10*3/uL (ref 0.0–0.5)
Eosinophils Relative: 4 %
HCT: 33.2 % — ABNORMAL LOW (ref 36.0–46.0)
Hemoglobin: 10.6 g/dL — ABNORMAL LOW (ref 12.0–15.0)
Immature Granulocytes: 1 %
Lymphocytes Relative: 22 %
Lymphs Abs: 0.5 10*3/uL — ABNORMAL LOW (ref 0.7–4.0)
MCH: 28.1 pg (ref 26.0–34.0)
MCHC: 31.9 g/dL (ref 30.0–36.0)
MCV: 88.1 fL (ref 80.0–100.0)
Monocytes Absolute: 0.1 10*3/uL (ref 0.1–1.0)
Monocytes Relative: 4 %
Neutro Abs: 1.7 10*3/uL (ref 1.7–7.7)
Neutrophils Relative %: 68 %
Platelet Count: 162 10*3/uL (ref 150–400)
RBC: 3.77 MIL/uL — ABNORMAL LOW (ref 3.87–5.11)
RDW: 15.3 % (ref 11.5–15.5)
WBC Count: 2.5 10*3/uL — ABNORMAL LOW (ref 4.0–10.5)
nRBC: 0 % (ref 0.0–0.2)

## 2020-03-25 ENCOUNTER — Other Ambulatory Visit: Payer: Self-pay

## 2020-03-25 ENCOUNTER — Inpatient Hospital Stay: Payer: Medicare Other

## 2020-03-25 DIAGNOSIS — C3492 Malignant neoplasm of unspecified part of left bronchus or lung: Secondary | ICD-10-CM

## 2020-03-25 DIAGNOSIS — Z5112 Encounter for antineoplastic immunotherapy: Secondary | ICD-10-CM | POA: Diagnosis not present

## 2020-03-25 DIAGNOSIS — Z5111 Encounter for antineoplastic chemotherapy: Secondary | ICD-10-CM | POA: Diagnosis not present

## 2020-03-25 DIAGNOSIS — C3412 Malignant neoplasm of upper lobe, left bronchus or lung: Secondary | ICD-10-CM | POA: Diagnosis not present

## 2020-03-25 DIAGNOSIS — Z79899 Other long term (current) drug therapy: Secondary | ICD-10-CM | POA: Diagnosis not present

## 2020-03-25 LAB — CMP (CANCER CENTER ONLY)
ALT: 23 U/L (ref 0–44)
AST: 18 U/L (ref 15–41)
Albumin: 3.2 g/dL — ABNORMAL LOW (ref 3.5–5.0)
Alkaline Phosphatase: 57 U/L (ref 38–126)
Anion gap: 8 (ref 5–15)
BUN: 32 mg/dL — ABNORMAL HIGH (ref 8–23)
CO2: 27 mmol/L (ref 22–32)
Calcium: 8.2 mg/dL — ABNORMAL LOW (ref 8.9–10.3)
Chloride: 108 mmol/L (ref 98–111)
Creatinine: 1.17 mg/dL — ABNORMAL HIGH (ref 0.44–1.00)
GFR, Est AFR Am: 54 mL/min — ABNORMAL LOW (ref >60)
GFR, Estimated: 46 mL/min — ABNORMAL LOW (ref >60)
Glucose, Bld: 108 mg/dL — ABNORMAL HIGH (ref 70–99)
Potassium: 3.8 mmol/L (ref 3.5–5.1)
Sodium: 143 mmol/L (ref 135–145)
Total Bilirubin: <0.2 mg/dL — ABNORMAL LOW (ref 0.3–1.2)
Total Protein: 6.7 g/dL (ref 6.5–8.1)

## 2020-03-25 LAB — CBC WITH DIFFERENTIAL (CANCER CENTER ONLY)
Abs Immature Granulocytes: 0.01 10*3/uL (ref 0.00–0.07)
Basophils Absolute: 0 10*3/uL (ref 0.0–0.1)
Basophils Relative: 0 %
Eosinophils Absolute: 0.1 10*3/uL (ref 0.0–0.5)
Eosinophils Relative: 3 %
HCT: 30.2 % — ABNORMAL LOW (ref 36.0–46.0)
Hemoglobin: 9.7 g/dL — ABNORMAL LOW (ref 12.0–15.0)
Immature Granulocytes: 0 %
Lymphocytes Relative: 31 %
Lymphs Abs: 0.7 10*3/uL (ref 0.7–4.0)
MCH: 28.4 pg (ref 26.0–34.0)
MCHC: 32.1 g/dL (ref 30.0–36.0)
MCV: 88.6 fL (ref 80.0–100.0)
Monocytes Absolute: 0.5 10*3/uL (ref 0.1–1.0)
Monocytes Relative: 21 %
Neutro Abs: 1 10*3/uL — ABNORMAL LOW (ref 1.7–7.7)
Neutrophils Relative %: 45 %
Platelet Count: 56 10*3/uL — ABNORMAL LOW (ref 150–400)
RBC: 3.41 MIL/uL — ABNORMAL LOW (ref 3.87–5.11)
RDW: 15 % (ref 11.5–15.5)
WBC Count: 2.2 10*3/uL — ABNORMAL LOW (ref 4.0–10.5)
nRBC: 0 % (ref 0.0–0.2)

## 2020-03-30 NOTE — Progress Notes (Signed)
[facility] OFFICE PROGRESS NOTE  Celene Squibb, MD [address] [place] [address]  DIAGNOSIS: Stage IV (T3, N2,M1a)non-small cell lung cancer, poorly differentiated adenocarcinoma presented with large left upper lobe lung mass in addition to mediastinal lymphadenopathy and bilateral pulmonary nodules diagnosed in June 2021.  Biomarker Findings Tumor Mutational Burden - 10 Muts/Mb Microsatellite status - MS-Stable Genomic Findings For a complete list of the genes assayed, please refer to the Appendix. NF1 Q2582* KRAS G12V CTNNB1 A61f*10 TP53 H179N 7 Disease relevant genes with no reportable alterations: ALK, BRAF, EGFR, ERBB2, MET, RET, ROS1  PDL1 Expression: 100%  PRIOR THERAPY: Palliative radiotherapy to the large left upper lobe lung mass.  CURRENT THERAPY: Systemic chemotherapy with carboplatin for AUC of 5, Alimta 500 mg/M2 and Keytruda 200 mg IV every 3 weeks.  First dose 02/12/2020.  Status post 2 cycles.  INTERVAL HISTORY: Kayla BROUSE74y.o. female returns to the clinic for a follow up visit accompanied by her husband. The patient is feeling well today without any concerning complaints except for fatigue and generalized weakness. The patient continues to tolerate treatment with chemotherapy/immunotherapy well without any adverse side effects. Denies any fever, chills, night sweats, or weight loss. , She is trying to eat Carnation breakfast essentials d although she does not like the taste.  Enies any chest pain or hemoptysis but reports her baseline dyspnea on exertion and she is on home oxygen via nasal cannula. She also has a mild productive cough. Denies any nausea, vomiting, diarrhea, or constipation. Denies any visual changes but reports a headache "every once a while".  She takes Tylenol which usually helps her headaches.  Her baseline brain MRI was negative for any evidence of metastatic disease to the brain.  Denies any rashes or skin changes.  The patient is here today for evaluation prior to starting cycle # 3   MEDICAL HISTORY: Past Medical History:  Diagnosis Date  . Arthritis   . COPD (chronic obstructive pulmonary disease) ([place]   . Depression   . E. coli pyelonephritis 6//2014  . GERD (gastroesophageal reflux disease)   . Pneumonia   . [place]monary hypertension ([place]   . [place]epsis ([place]     ALLERGIES:  is allergic to sulfur.  MEDICATIONS:  Current Outpatient Medications  Medication Sig Dispense Refill  . acetaminophen (TYLENOL) 500 MG tablet Take 500 mg by mouth every 6 (six) hours as needed for mild pain.    .Marland Kitchenalbuterol (PROVENTIL) (2.5 MG/3ML) 0.083% nebulizer solution Take 3 mLs (2.5 mg total) by nebulization every 6 (six) hours as needed for wheezing or shortness of breath. 75 mL 12  . atorvastatin (LIPITOR) 40 MG tablet Take 40 mg by mouth daily.    . folic acid (FOLVITE) 1 MG tablet Take 1 tablet (1 mg total) by mouth daily. 30 tablet 4  . levocetirizine (XYZAL) 5 MG tablet Take 5 mg by mouth every evening.     . lidocaine-prilocaine (EMLA) cream Apply to the Port-A-Cath site 30-60-minute before chemotherapy every 3 weeks. 30 g 0  . Multiple Vitamin (MULTIVITAMIN WITH MINERALS) TABS tablet Take 1 tablet by mouth daily.    .Marland Kitchenomeprazole (PRILOSEC) 20 MG capsule Take 20 mg by mouth daily.     . prochlorperazine (COMPAZINE) 10 MG tablet Take 1 tablet (10 mg total) by mouth every 6 (six) hours as needed for nausea or vomiting. 30 tablet 0  . sertraline (ZOLOFT) 100 MG tablet Take 100 mg by mouth daily.     .Marland Kitchen  TRELEGY ELLIPTA 100-62.5-25 MCG/INH AEPB Inhale 1 puff into the lungs daily.    Marland Kitchen aspirin EC 81 MG tablet Take 1 tablet (81 mg total) by mouth daily. (Patient not taking: Reported on 04/01/2020)     No current facility-administered medications for this visit.    SURGICAL HISTORY:  Past Surgical History:  Procedure Laterality Date  . ABDOMINAL HYSTERECTOMY     partial  . AORTA - BILATERAL FEMORAL ARTERY BYPASS  GRAFT Bilateral 06/14/2016   Procedure: AORTOBIFEMORAL BYPASS GRAFT AORTA SMA BYPASS GRAFT RE-IMPLANTATION  OF SUPERIOR MESENTERIC ARTERY;  Surgeon: Serafina Mitchell, MD;  Location: [facility];  Service: Vascular;  Laterality: Bilateral;  . BACK SURGERY     L4/5  . CARDIAC CATHETERIZATION N/A 01/03/2016   Procedure: Right/Left Heart Cath and Coronary Angiography;  Surgeon: Jolaine Artist, MD;  Location: [place] [facility];  Service: Cardiovascular;  Laterality: N/A;  . COLONOSCOPY N/A 08/05/2015   Procedure: COLONOSCOPY;  Surgeon: Rogene Houston, MD;  [facility];  Service: Endoscopy;  Laterality: N/A;  730  . ELBOW SURGERY    . IR IMAGING GUIDED PORT INSERTION  02/16/2020  . PERIPHERAL VASCULAR CATHETERIZATION N/A 02/16/2016   Procedure: Abdominal Aortogram w/Lower Extremity;  Surgeon: Wellington Hampshire, MD;  Location: [place] [facility];  Service: Cardiovascular;  Laterality: N/A;    REVIEW OF SYSTEMS:   Review of Systems  Constitutional: Positive for fatigue, decreased appetite, and generalized weakness.  Negative for chills, fever and unexpected weight change.  HENT: Negative for mouth sores, nosebleeds, sore throat and trouble swallowing.   Eyes: Negative for eye problems and icterus.  Respiratory: Positive for mild dyspnea on exertion and a mild cough.  Negative for hemoptysis, and wheezing.   Cardiovascular: Negative for chest pain and leg swelling.  Gastrointestinal: Negative for abdominal pain, constipation, diarrhea, nausea and vomiting.  Genitourinary: Negative for bladder incontinence, difficulty urinating, dysuria, frequency and hematuria.   Musculoskeletal: Negative for back pain, gait problem, neck pain and neck stiffness.  Skin: Negative for itching and rash.  Neurological: Occasional mild headache.  Negative for dizziness, extremity weakness, gait problem, light-headedness and seizures.  Hematological: Negative for adenopathy. Does not bruise/bleed easily.   Psychiatric/Behavioral: Negative for confusion, depression and sleep disturbance. The patient is not nervous/anxious.     PHYSICAL EXAMINATION:  Blood pressure 93/68, pulse (!) 109, temperature 97.9 F (36.6 C), temperature source Tympanic, resp. rate 20, height _0  (1.727 m), weight 142 lb 14.4 oz (64.8 kg), SpO2 98 %.  ECOG PERFORMANCE STATUS: 2 - Symptomatic, <50% confined to bed  Physical Exam  Constitutional: Oriented to person, place, and time and elderly appearing female and in no distress.  HENT:  Head: Normocephalic and atraumatic.  Mouth/Throat: Oropharynx is clear and moist. No oropharyngeal exudate.  Eyes: Conjunctivae are normal. Right eye exhibits no discharge. Left eye exhibits no discharge. No scleral icterus.  Neck: Normal range of motion. Neck supple.  Cardiovascular: Normal rate, regular rhythm, normal heart sounds and intact distal pulses.   Pulmonary/Chest: Effort normal and breath sounds normal. No respiratory distress. No wheezes. No rales.  Abdominal: Soft. Bowel sounds are normal. Exhibits no distension and no mass. There is no tenderness.  Musculoskeletal: Normal range of motion. Exhibits no edema.  Lymphadenopathy:    No cervical adenopathy.  Neurological: Alert and oriented to person, place, and time. Exhibits muscle wasting the patient was examined in the wheelchair. Skin: Skin is warm and dry. No rash noted. Not diaphoretic. No  erythema. No pallor.  Psychiatric: Mood, memory and judgment normal.  Vitals reviewed.  LABORATORY DATA: Lab Results  Component Value Date   WBC 4.8 04/01/2020   HGB 9.6 (L) 04/01/2020   HCT 29.6 (L) 04/01/2020   MCV 88.4 04/01/2020   PLT 233 04/01/2020      Chemistry      Component Value Date/Time   NA 143 03/25/2020 1121   K 3.8 03/25/2020 1121   CL 108 03/25/2020 1121   CO2 27 03/25/2020 1121   BUN 32 (H) 03/25/2020 1121   CREATININE 1.17 (H) 03/25/2020 1121   CREATININE 0.90 02/08/2016 0949      Component  Value Date/Time   CALCIUM 8.2 (L) 03/25/2020 1121   ALKPHOS 57 03/25/2020 1121   AST 18 03/25/2020 1121   ALT 23 03/25/2020 1121   BILITOT <0.2 (L) 03/25/2020 1121       RADIOGRAPHIC STUDIES:  No results found.   ASSESSMENT/PLAN:  This is a very pleasant 74 year old Caucasian female with a stage IV (T3, N2, M1a) non-small cell lung cancer, poorly differentiated adenocarcinoma diagnosed in June 2021 and presented with large left upper lobe lung mass with mediastinal invasion in addition to mediastinal lymphadenopathy as well as bilateral pulmonary nodules. Her PDL1 expression is 100% and she has no actionable mutations.   She completed palliative radiotherapy the lung due to her bulky disease.   She is currently undergoing palliative systemic chemotherapy with carboplatin for an AUC of 5, Alimta 500 mg per metered squared, Keytruda 200 mg IV every 3 weeks.  She is status post 2 cycles.   Labs were reviewed. Reviewed her GFR and creatinine with Dr. Julien Nordmann who is ok to proceed with cycle #3 today as scheduled.  I will arrange for the patient to have a restaging CT scan of her chest, abdomen, and pelvis prior to her next cycle of treatment.  We will see the patient back for follow-up visit in 3 weeks for evaluation and to review her scan results.  The patient was strongly encouraged to continue to eat or drink more fluids.  She does not take any antihypertensive medications.  The patient's blood pressure is low today.  I will check with the infusion room to see if they have time to give her a half a liter or liter of extra IV fluids today.  If not, the patient will instructed to increase her oral hydration.  The patient was advised to call immediately if she has any concerning symptoms in the interval. The patient voices understanding of current disease status and treatment options and is in agreement with the current care plan. All questions were answered. The patient knows to call the  clinic with any problems, questions or concerns. We can certainly see the patient much sooner if necessary     Orders Placed This Encounter  Procedures  . CT Chest W Contrast    Standing Status:   Future    Standing Expiration Date:   04/01/2021    Order Specific Question:   If indicated for the ordered procedure, I authorize the administration of contrast media per Radiology protocol    Answer:   Yes    Order Specific Question:   Preferred imaging location?    Answer:   [facility]    Order Specific Question:   Radiology Contrast Protocol - do NOT remove file path    Answer:   [email or web address]  . CT Abdomen Pelvis W Contrast    Standing  Status:   Future    Standing Expiration Date:   04/01/2021    Order Specific Question:   If indicated for the ordered procedure, I authorize the administration of contrast media per Radiology protocol    Answer:   Yes    Order Specific Question:   Preferred imaging location?    Answer:   [facility]    Order Specific Question:   Is Oral Contrast requested for this exam?    Answer:   Yes, Per Radiology protocol    Order Specific Question:   Radiology Contrast Protocol - do NOT remove file path    Answer:   [email or web address]     [place], PA-C 04/01/20

## 2020-03-31 NOTE — Progress Notes (Incomplete)
  Patient Name: Kayla Sawyer MRN: [id number] DOB: Jul 09, 1946 Referring Physician: Curt Bears (Profile Not Attached) Date of Service: 03/09/2020 [facility], [place]                                                        End Of Treatment Note  Diagnoses: C34.12-Malignant neoplasm of upper lobe, left bronchus or lung  Cancer Staging: Stage IV (T3, N2,M1a)non-small cellleft upper lobelung cancer, poorly differentiated adenocarcinoma with mediastinal lymphadenopathy and bilateral pulmonary nodules  Intent: Palliative  Radiation Treatment Dates: 02/19/2020 through 03/09/2020 Site Technique Total Dose (Gy) Dose per Fx (Gy) Completed Fx Beam Energies  Lung, Left: Lung_Lt 3D 35/35 2.5 14/14 6X, 10X, 15X   Narrative: The patient tolerated radiation therapy relatively well. She did report a dull headache during the first week of treatment that improved. She also reported moderate fagitue, low blood pressures that improved with increased fluid intake, decreased appetite, a productive cough with pale/white phlegm in the mornings, and shortness of breath with exertion. She remained on 3L supplemental oxygen continuously. She denied chest pain, pain when swallowing, and difficulty swallowing.  Plan: The patient will follow-up with radiation oncology in one month.  ________________________________________________   Blair Promise, PhD, MD  This document serves as a record of services personally performed by Gery Pray, MD. It was created on his behalf by Clerance Lav, a trained medical scribe. The creation of this record is based on the scribe's personal observations and the provider's statements to them. This document has been checked and approved by the attending provider.

## 2020-04-01 ENCOUNTER — Other Ambulatory Visit: Payer: Medicare Other

## 2020-04-01 ENCOUNTER — Other Ambulatory Visit: Payer: Self-pay

## 2020-04-01 ENCOUNTER — Inpatient Hospital Stay: Payer: Medicare Other | Admitting: Physician Assistant

## 2020-04-01 ENCOUNTER — Inpatient Hospital Stay: Payer: Medicare Other

## 2020-04-01 ENCOUNTER — Ambulatory Visit: Payer: Medicare Other

## 2020-04-01 ENCOUNTER — Ambulatory Visit: Payer: Medicare Other | Admitting: Physician Assistant

## 2020-04-01 VITALS — BP 93/68 | HR 109 | Temp 97.9°F | Resp 20 | Ht 68.0 in | Wt 142.9 lb

## 2020-04-01 VITALS — HR 92

## 2020-04-01 DIAGNOSIS — Z5111 Encounter for antineoplastic chemotherapy: Secondary | ICD-10-CM

## 2020-04-01 DIAGNOSIS — Z5112 Encounter for antineoplastic immunotherapy: Secondary | ICD-10-CM | POA: Diagnosis not present

## 2020-04-01 DIAGNOSIS — C3492 Malignant neoplasm of unspecified part of left bronchus or lung: Secondary | ICD-10-CM | POA: Diagnosis not present

## 2020-04-01 DIAGNOSIS — C3412 Malignant neoplasm of upper lobe, left bronchus or lung: Secondary | ICD-10-CM | POA: Diagnosis not present

## 2020-04-01 DIAGNOSIS — I959 Hypotension, unspecified: Secondary | ICD-10-CM

## 2020-04-01 DIAGNOSIS — Z95828 Presence of other vascular implants and grafts: Secondary | ICD-10-CM

## 2020-04-01 DIAGNOSIS — Z79899 Other long term (current) drug therapy: Secondary | ICD-10-CM | POA: Diagnosis not present

## 2020-04-01 LAB — CBC WITH DIFFERENTIAL (CANCER CENTER ONLY)
Abs Immature Granulocytes: 0.11 10*3/uL — ABNORMAL HIGH (ref 0.00–0.07)
Basophils Absolute: 0 10*3/uL (ref 0.0–0.1)
Basophils Relative: 0 %
Eosinophils Absolute: 0.1 10*3/uL (ref 0.0–0.5)
Eosinophils Relative: 3 %
HCT: 29.6 % — ABNORMAL LOW (ref 36.0–46.0)
Hemoglobin: 9.6 g/dL — ABNORMAL LOW (ref 12.0–15.0)
Immature Granulocytes: 2 %
Lymphocytes Relative: 20 %
Lymphs Abs: 1 10*3/uL (ref 0.7–4.0)
MCH: 28.7 pg (ref 26.0–34.0)
MCHC: 32.4 g/dL (ref 30.0–36.0)
MCV: 88.4 fL (ref 80.0–100.0)
Monocytes Absolute: 1.1 10*3/uL — ABNORMAL HIGH (ref 0.1–1.0)
Monocytes Relative: 22 %
Neutro Abs: 2.6 10*3/uL (ref 1.7–7.7)
Neutrophils Relative %: 53 %
Platelet Count: 233 10*3/uL (ref 150–400)
RBC: 3.35 MIL/uL — ABNORMAL LOW (ref 3.87–5.11)
RDW: 16.6 % — ABNORMAL HIGH (ref 11.5–15.5)
WBC Count: 4.8 10*3/uL (ref 4.0–10.5)
nRBC: 0 % (ref 0.0–0.2)

## 2020-04-01 LAB — CMP (CANCER CENTER ONLY)
ALT: 12 U/L (ref 0–44)
AST: 14 U/L — ABNORMAL LOW (ref 15–41)
Albumin: 3.3 g/dL — ABNORMAL LOW (ref 3.5–5.0)
Alkaline Phosphatase: 56 U/L (ref 38–126)
Anion gap: 8 (ref 5–15)
BUN: 25 mg/dL — ABNORMAL HIGH (ref 8–23)
CO2: 28 mmol/L (ref 22–32)
Calcium: 8.1 mg/dL — ABNORMAL LOW (ref 8.9–10.3)
Chloride: 106 mmol/L (ref 98–111)
Creatinine: 1.28 mg/dL — ABNORMAL HIGH (ref 0.44–1.00)
GFR, Est AFR Am: 48 mL/min — ABNORMAL LOW (ref >60)
GFR, Estimated: 41 mL/min — ABNORMAL LOW (ref >60)
Glucose, Bld: 97 mg/dL (ref 70–99)
Potassium: 4.4 mmol/L (ref 3.5–5.1)
Sodium: 142 mmol/L (ref 135–145)
Total Bilirubin: <0.2 mg/dL — ABNORMAL LOW (ref 0.3–1.2)
Total Protein: 6.7 g/dL (ref 6.5–8.1)

## 2020-04-01 LAB — TSH: TSH: 0.512 u[IU]/mL (ref 0.308–3.960)

## 2020-04-01 MED ORDER — PALONOSETRON HCL INJECTION 0.25 MG/5ML
INTRAVENOUS | Status: AC
  Filled 2020-04-01: qty 5

## 2020-04-01 MED ORDER — SODIUM CHLORIDE 0.9 % IV SOLN
Freq: Once | INTRAVENOUS | Status: AC
  Filled 2020-04-01: qty 250

## 2020-04-01 MED ORDER — PALONOSETRON HCL INJECTION 0.25 MG/5ML
0.2500 mg | Freq: Once | INTRAVENOUS | Status: AC
  Administered 2020-04-01: 0.25 mg via INTRAVENOUS

## 2020-04-01 MED ORDER — CYANOCOBALAMIN 1000 MCG/ML IJ SOLN
1000.0000 ug | Freq: Once | INTRAMUSCULAR | Status: AC
  Administered 2020-04-01: 1000 ug via INTRAMUSCULAR

## 2020-04-01 MED ORDER — SODIUM CHLORIDE 0.9 % IV SOLN
150.0000 mg | Freq: Once | INTRAVENOUS | Status: AC
  Administered 2020-04-01: 150 mg via INTRAVENOUS
  Filled 2020-04-01: qty 150

## 2020-04-01 MED ORDER — SODIUM CHLORIDE 0.9 % IV SOLN
10.0000 mg | Freq: Once | INTRAVENOUS | Status: AC
  Administered 2020-04-01: 10 mg via INTRAVENOUS
  Filled 2020-04-01: qty 10

## 2020-04-01 MED ORDER — CYANOCOBALAMIN 1000 MCG/ML IJ SOLN
INTRAMUSCULAR | Status: AC
  Filled 2020-04-01: qty 1

## 2020-04-01 MED ORDER — SODIUM CHLORIDE 0.9 % IV SOLN
200.0000 mg | Freq: Once | INTRAVENOUS | Status: AC
  Administered 2020-04-01: 200 mg via INTRAVENOUS
  Filled 2020-04-01: qty 8

## 2020-04-01 MED ORDER — SODIUM CHLORIDE 0.9% FLUSH
10.0000 mL | INTRAVENOUS | Status: DC | PRN
  Administered 2020-04-01: 10 mL
  Filled 2020-04-01: qty 10

## 2020-04-01 MED ORDER — SODIUM CHLORIDE 0.9 % IV SOLN
345.0000 mg | Freq: Once | INTRAVENOUS | Status: AC
  Administered 2020-04-01: 350 mg via INTRAVENOUS
  Filled 2020-04-01: qty 35

## 2020-04-01 MED ORDER — SODIUM CHLORIDE 0.9% FLUSH
10.0000 mL | Freq: Once | INTRAVENOUS | Status: AC
  Administered 2020-04-01: 10 mL
  Filled 2020-04-01: qty 10

## 2020-04-01 MED ORDER — HEPARIN SOD (PORK) LOCK FLUSH 100 UNIT/ML IV SOLN
500.0000 [IU] | Freq: Once | INTRAVENOUS | Status: AC | PRN
  Administered 2020-04-01: 500 [IU]
  Filled 2020-04-01: qty 5

## 2020-04-01 MED ORDER — SODIUM CHLORIDE 0.9 % IV SOLN
500.0000 mg/m2 | Freq: Once | INTRAVENOUS | Status: AC
  Administered 2020-04-01: 900 mg via INTRAVENOUS
  Filled 2020-04-01: qty 20

## 2020-04-01 NOTE — Patient Instructions (Signed)
[facility] Discharge Instructions for Patients Receiving Chemotherapy  Today you received the following chemotherapy agents: Keytruda/Alimta/Carboplatin.  To help prevent nausea and vomiting after your treatment, we encourage you to take your nausea medication as directed.   If you develop nausea and vomiting that is not controlled by your nausea medication, call the clinic.   BELOW ARE SYMPTOMS THAT SHOULD BE REPORTED IMMEDIATELY:  *FEVER GREATER THAN 100.5 F  *CHILLS WITH OR WITHOUT FEVER  NAUSEA AND VOMITING THAT IS NOT CONTROLLED WITH YOUR NAUSEA MEDICATION  *UNUSUAL SHORTNESS OF BREATH  *UNUSUAL BRUISING OR BLEEDING  TENDERNESS IN MOUTH AND THROAT WITH OR WITHOUT PRESENCE OF ULCERS  *URINARY PROBLEMS  *BOWEL PROBLEMS  UNUSUAL RASH Items with * indicate a potential emergency and should be followed up as soon as possible.  Feel free to call the clinic should you have any questions or concerns. The clinic phone number is [phone].  Please show the [facility] at check-in to the Emergency Department and triage nurse.

## 2020-04-07 DIAGNOSIS — Z720 Tobacco use: Secondary | ICD-10-CM | POA: Diagnosis not present

## 2020-04-07 DIAGNOSIS — E782 Mixed hyperlipidemia: Secondary | ICD-10-CM | POA: Diagnosis not present

## 2020-04-07 DIAGNOSIS — K219 Gastro-esophageal reflux disease without esophagitis: Secondary | ICD-10-CM | POA: Diagnosis not present

## 2020-04-07 DIAGNOSIS — J441 Chronic obstructive pulmonary disease with (acute) exacerbation: Secondary | ICD-10-CM | POA: Diagnosis not present

## 2020-04-08 ENCOUNTER — Telehealth: Payer: Self-pay | Admitting: Internal Medicine

## 2020-04-08 ENCOUNTER — Inpatient Hospital Stay: Payer: Medicare Other

## 2020-04-08 NOTE — Telephone Encounter (Signed)
Rescheduled 10/1 lab appt to 9/30. Called and spoke with pt to confirmed date and time

## 2020-04-10 DIAGNOSIS — J432 Centrilobular emphysema: Secondary | ICD-10-CM | POA: Diagnosis not present

## 2020-04-10 DIAGNOSIS — J849 Interstitial pulmonary disease, unspecified: Secondary | ICD-10-CM | POA: Diagnosis not present

## 2020-04-10 DIAGNOSIS — J449 Chronic obstructive pulmonary disease, unspecified: Secondary | ICD-10-CM | POA: Diagnosis not present

## 2020-04-14 ENCOUNTER — Other Ambulatory Visit: Payer: Self-pay

## 2020-04-14 ENCOUNTER — Ambulatory Visit (INDEPENDENT_AMBULATORY_CARE_PROVIDER_SITE_OTHER): Payer: Medicare Other | Admitting: Pulmonary Disease

## 2020-04-14 ENCOUNTER — Encounter: Payer: Self-pay | Admitting: Pulmonary Disease

## 2020-04-14 DIAGNOSIS — J432 Centrilobular emphysema: Secondary | ICD-10-CM

## 2020-04-14 DIAGNOSIS — J9611 Chronic respiratory failure with hypoxia: Secondary | ICD-10-CM | POA: Diagnosis not present

## 2020-04-14 DIAGNOSIS — C3492 Malignant neoplasm of unspecified part of left bronchus or lung: Secondary | ICD-10-CM | POA: Diagnosis not present

## 2020-04-14 NOTE — Assessment & Plan Note (Signed)
Overall she has done well, tolerating 3 cycles of chemotherapy well She has 3 more cycles to go, staging CT is planned She has radiation dermatitis on her back with scaling, this should hopefully improve with time

## 2020-04-14 NOTE — Assessment & Plan Note (Signed)
Continue Trelegy  ,  use albuterol as needed

## 2020-04-14 NOTE — Patient Instructions (Signed)
Stay on trelegy & oxygen

## 2020-04-14 NOTE — Progress Notes (Signed)
Radiation Oncology         [phone] ________________________________  Name: Kayla Sawyer MRN: [id number]  Date: 04/15/2020  DOB: 23-Apr-1946  Follow-Up Visit Note  CC: Celene Squibb, MD  Curt Bears, MD    ICD-10-CM   1. Adenocarcinoma of left lung, stage 4 (HCC)  C34.92     Diagnosis: Stage IV (T3, N2,M1a)non-small cellleft upper lobelung cancer, poorly differentiated adenocarcinoma with mediastinal lymphadenopathy and bilateral pulmonary nodules  Interval Since Last Radiation: One month and six days  Radiation Treatment Dates: 02/19/2020 through 03/09/2020 Site Technique Total Dose (Gy) Dose per Fx (Gy) Completed Fx Beam Energies  Lung, Left: Lung_Lt 3D 35/35 2.5 14/14 6X, 10X, 15X    Narrative:  The patient returns today for routine follow-up. The patient continues systemic chemotherapy with Carboplatin, Alimta, and Keytruda (started on 02/12/2020) under the care of Dr. Julien Nordmann and [facility], PA-C. She has tolerated treatment relatively well with the exception of fatigue and generalized weakness. Restaging CT scan of chest/abdomen/pelvis will be performed prior to her next cycle.  On review of systems, she reports feeling well. She denies pain in the chest or back region.  She denies any hemoptysis or significant cough.  ALLERGIES:  is allergic to sulfur.  Meds: Current Outpatient Medications  Medication Sig Dispense Refill  . acetaminophen (TYLENOL) 500 MG tablet Take 500 mg by mouth every 6 (six) hours as needed for mild pain.    Marland Kitchen albuterol (PROVENTIL) (2.5 MG/3ML) 0.083% nebulizer solution Take 3 mLs (2.5 mg total) by nebulization every 6 (six) hours as needed for wheezing or shortness of breath. 75 mL 12  . aspirin EC 81 MG tablet Take 1 tablet (81 mg total) by mouth daily. (Patient not taking: Reported on 04/14/2020)    . atorvastatin (LIPITOR) 40 MG tablet Take 40 mg by mouth daily.    . folic acid (FOLVITE) 1 MG tablet Take 1 tablet (1 mg total)  by mouth daily. 30 tablet 4  . levocetirizine (XYZAL) 5 MG tablet Take 5 mg by mouth every evening.     . lidocaine-prilocaine (EMLA) cream Apply to the Port-A-Cath site 30-60-minute before chemotherapy every 3 weeks. 30 g 0  . Multiple Vitamin (MULTIVITAMIN WITH MINERALS) TABS tablet Take 1 tablet by mouth daily.    Marland Kitchen omeprazole (PRILOSEC) 20 MG capsule Take 20 mg by mouth daily.     . prochlorperazine (COMPAZINE) 10 MG tablet Take 1 tablet (10 mg total) by mouth every 6 (six) hours as needed for nausea or vomiting. 30 tablet 0  . sertraline (ZOLOFT) 100 MG tablet Take 100 mg by mouth daily.     . TRELEGY ELLIPTA 100-62.5-25 MCG/INH AEPB Inhale 1 puff into the lungs daily.     No current facility-administered medications for this encounter.    Physical Findings: The patient is in no acute distress. Patient is alert and oriented.  temperature is 98.7 F (37.1 C). Her blood pressure is 96/55 (abnormal) and her pulse is 104 (abnormal). Her respiration is 20 and oxygen saturation is 97%.  No significant changes. Lungs are clear to auscultation bilaterally. Heart has regular rate and rhythm. No palpable cervical, supraclavicular, or axillary adenopathy. Abdomen soft, non-tender, normal bowel sounds.  Oxygen in place  Lab Findings: Lab Results  Component Value Date   WBC 3.2 (L) 04/15/2020   HGB 8.4 (L) 04/15/2020   HCT 25.8 (L) 04/15/2020   MCV 88.7 04/15/2020   PLT 71 (L) 04/15/2020    Radiographic Findings: No  results found.  Impression: Stage IV (T3, N2,M1a)non-small cellleft upper lobelung cancer, poorly differentiated adenocarcinoma with mediastinal lymphadenopathy and bilateral pulmonary nodules  The patient is recovering from the effects of radiation. She has undergone three cycles of systemic chemotherapy and has three more cycles to go.   Plan: Restaging CT scan of chest/abdomen/pelvis is scheduled for 04/19/2020. The patient will follow-up with Dr. Julien Nordmann on 04/21/2020  prior to her next cycle of chemotherapy. She will follow-up with radiation oncology in as needed basis in light of her close follow-up with medical oncology.    ____________________________________   Blair Promise, PhD, MD  This document serves as a record of services personally performed by Gery Pray, MD. It was created on his behalf by Clerance Lav, a trained medical scribe. The creation of this record is based on the scribe's personal observations and the provider's statements to them. This document has been checked and approved by the attending provider.

## 2020-04-14 NOTE — Progress Notes (Signed)
   Subjective:    Patient ID: Kayla Sawyer, female    DOB: 1946/02/12, 74 y.o.   MRN: [id number]  HPI  74 year old smoker for follow-up of COPD and chronic hypoxic respiratory failure Left upper lobe lung mass, hypermetabolic with scattered bilateral nodules and hilar and mediastinal lymphadenopathy >>  Stage IV (T3, N2,M1a)non-small cell lung cancer, poorly differentiated adenocarcinoma , PDL1 Expression: 100%  S/p palliative radiotherapy Systemic chemotherapy with carboplatin , Alimtaand Keytruda  every 3 weeks s/p 3 cycles  In good spirits, c/o itching on back Breathing ok  Significant tests/ events reviewed  PFTs 11/2015 showed ratio 52 with FEV1 53%, FVC 77%, paradoxical drop with bronhodilator, TLC 99% & DLCO 29%  HRCT 07/2016 RUL nodule regressed, no ILD,moderate centrilobular andparaseptal emphysema  11/2019 she did not desaturate on walking on 3 L pulse oxygen but her heart rate went up from 86-1 37   Review of Systems Patient denies significant dyspnea,cough, hemoptysis,  chest pain, palpitations, pedal edema, orthopnea, paroxysmal nocturnal dyspnea, lightheadedness, nausea, vomiting, abdominal or  leg pains      Objective:   Physical Exam  Gen. Pleasant, elderly, in no distress ENT - no thrush, no pallor/icterus,no post nasal drip Neck: No JVD, no thyromegaly, no carotid bruits Lungs: no use of accessory muscles, no dullness to percussion, decreased BL without rales or rhonchi , scaling onback Cardiovascular: Rhythm regular, heart sounds  normal, no murmurs or gallops, no peripheral edema Musculoskeletal: No deformities, no cyanosis or clubbing        Assessment & Plan:

## 2020-04-14 NOTE — Assessment & Plan Note (Signed)
Continue 2 L O2

## 2020-04-15 ENCOUNTER — Encounter: Payer: Self-pay | Admitting: Radiation Oncology

## 2020-04-15 ENCOUNTER — Ambulatory Visit
Admission: RE | Admit: 2020-04-15 | Discharge: 2020-04-15 | Disposition: A | Discharge status: Home / Self Care | Payer: Medicare Other | Source: Ambulatory Visit | Attending: Radiation Oncology | Admitting: Radiation Oncology

## 2020-04-15 ENCOUNTER — Inpatient Hospital Stay: Payer: Medicare Other

## 2020-04-15 ENCOUNTER — Other Ambulatory Visit: Payer: Self-pay

## 2020-04-15 DIAGNOSIS — J449 Chronic obstructive pulmonary disease, unspecified: Secondary | ICD-10-CM | POA: Diagnosis not present

## 2020-04-15 DIAGNOSIS — Z7982 Long term (current) use of aspirin: Secondary | ICD-10-CM | POA: Insufficient documentation

## 2020-04-15 DIAGNOSIS — C3492 Malignant neoplasm of unspecified part of left bronchus or lung: Secondary | ICD-10-CM

## 2020-04-15 DIAGNOSIS — Z79899 Other long term (current) drug therapy: Secondary | ICD-10-CM | POA: Diagnosis not present

## 2020-04-15 DIAGNOSIS — Z923 Personal history of irradiation: Secondary | ICD-10-CM | POA: Insufficient documentation

## 2020-04-15 DIAGNOSIS — Z5112 Encounter for antineoplastic immunotherapy: Secondary | ICD-10-CM | POA: Diagnosis not present

## 2020-04-15 DIAGNOSIS — Z9221 Personal history of antineoplastic chemotherapy: Secondary | ICD-10-CM | POA: Diagnosis not present

## 2020-04-15 DIAGNOSIS — R531 Weakness: Secondary | ICD-10-CM | POA: Insufficient documentation

## 2020-04-15 DIAGNOSIS — Z5111 Encounter for antineoplastic chemotherapy: Secondary | ICD-10-CM | POA: Diagnosis not present

## 2020-04-15 DIAGNOSIS — R5383 Other fatigue: Secondary | ICD-10-CM | POA: Insufficient documentation

## 2020-04-15 DIAGNOSIS — C3412 Malignant neoplasm of upper lobe, left bronchus or lung: Secondary | ICD-10-CM | POA: Diagnosis not present

## 2020-04-15 LAB — CBC WITH DIFFERENTIAL (CANCER CENTER ONLY)
Abs Immature Granulocytes: 0.01 10*3/uL (ref 0.00–0.07)
Basophils Absolute: 0 10*3/uL (ref 0.0–0.1)
Basophils Relative: 0 %
Eosinophils Absolute: 0 10*3/uL (ref 0.0–0.5)
Eosinophils Relative: 1 %
HCT: 25.8 % — ABNORMAL LOW (ref 36.0–46.0)
Hemoglobin: 8.4 g/dL — ABNORMAL LOW (ref 12.0–15.0)
Immature Granulocytes: 0 %
Lymphocytes Relative: 31 %
Lymphs Abs: 1 10*3/uL (ref 0.7–4.0)
MCH: 28.9 pg (ref 26.0–34.0)
MCHC: 32.6 g/dL (ref 30.0–36.0)
MCV: 88.7 fL (ref 80.0–100.0)
Monocytes Absolute: 0.9 10*3/uL (ref 0.1–1.0)
Monocytes Relative: 28 %
Neutro Abs: 1.2 10*3/uL — ABNORMAL LOW (ref 1.7–7.7)
Neutrophils Relative %: 40 %
Platelet Count: 71 10*3/uL — ABNORMAL LOW (ref 150–400)
RBC: 2.91 MIL/uL — ABNORMAL LOW (ref 3.87–5.11)
RDW: 17.1 % — ABNORMAL HIGH (ref 11.5–15.5)
WBC Count: 3.2 10*3/uL — ABNORMAL LOW (ref 4.0–10.5)
nRBC: 0 % (ref 0.0–0.2)

## 2020-04-15 LAB — CMP (CANCER CENTER ONLY)
ALT: 23 U/L (ref 0–44)
AST: 22 U/L (ref 15–41)
Albumin: 3.4 g/dL — ABNORMAL LOW (ref 3.5–5.0)
Alkaline Phosphatase: 58 U/L (ref 38–126)
Anion gap: 7 (ref 5–15)
BUN: 28 mg/dL — ABNORMAL HIGH (ref 8–23)
CO2: 28 mmol/L (ref 22–32)
Calcium: 8.2 mg/dL — ABNORMAL LOW (ref 8.9–10.3)
Chloride: 107 mmol/L (ref 98–111)
Creatinine: 1.44 mg/dL — ABNORMAL HIGH (ref 0.44–1.00)
GFR, Est AFR Am: 42 mL/min — ABNORMAL LOW (ref >60)
GFR, Estimated: 36 mL/min — ABNORMAL LOW (ref >60)
Glucose, Bld: 91 mg/dL (ref 70–99)
Potassium: 4.5 mmol/L (ref 3.5–5.1)
Sodium: 142 mmol/L (ref 135–145)
Total Bilirubin: <0.2 mg/dL — ABNORMAL LOW (ref 0.3–1.2)
Total Protein: 6.7 g/dL (ref 6.5–8.1)

## 2020-04-15 NOTE — Progress Notes (Signed)
Patient here for a 1 month f/u visit with Dr. Sondra Come.   She reports having no energy. Patient coughs early in the morning and phlegm is clear. She continues on 3 liters of oxygen. Patient denies nausea. She reports a decreased appetite and drinks Carnation Instant Breakfast every morning.  BP (!) 96/55 (BP Location: Left Arm, Patient Position: Sitting, Cuff Size: Normal)   Pulse (!) 104   Temp 98.7 F (37.1 C)   Resp 20   SpO2 97%   PF (!) 3 L/min   Wt Readings from Last 3 Encounters:  04/14/20 143 lb (64.9 kg)  04/01/20 142 lb 14.4 oz (64.8 kg)  03/11/20 142 lb (64.4 kg)

## 2020-04-16 ENCOUNTER — Other Ambulatory Visit: Payer: Medicare Other

## 2020-04-19 ENCOUNTER — Other Ambulatory Visit: Payer: Self-pay

## 2020-04-19 ENCOUNTER — Encounter (HOSPITAL_COMMUNITY): Payer: Self-pay

## 2020-04-19 ENCOUNTER — Ambulatory Visit (HOSPITAL_COMMUNITY)
Admission: RE | Admit: 2020-04-19 | Discharge: 2020-04-19 | Disposition: A | Discharge status: Home / Self Care | Payer: Medicare Other | Source: Ambulatory Visit | Attending: Physician Assistant | Admitting: Physician Assistant

## 2020-04-19 DIAGNOSIS — J439 Emphysema, unspecified: Secondary | ICD-10-CM | POA: Diagnosis not present

## 2020-04-19 DIAGNOSIS — C3492 Malignant neoplasm of unspecified part of left bronchus or lung: Secondary | ICD-10-CM

## 2020-04-19 DIAGNOSIS — N200 Calculus of kidney: Secondary | ICD-10-CM | POA: Diagnosis not present

## 2020-04-19 DIAGNOSIS — I700 Atherosclerosis of aorta: Secondary | ICD-10-CM | POA: Diagnosis not present

## 2020-04-19 DIAGNOSIS — I2510 Atherosclerotic heart disease of native coronary artery without angina pectoris: Secondary | ICD-10-CM | POA: Diagnosis not present

## 2020-04-19 MED ORDER — IOHEXOL 300 MG/ML  SOLN
75.0000 mL | Freq: Once | INTRAMUSCULAR | Status: AC | PRN
  Administered 2020-04-19: 75 mL via INTRAVENOUS

## 2020-04-21 ENCOUNTER — Other Ambulatory Visit: Payer: Self-pay

## 2020-04-21 ENCOUNTER — Inpatient Hospital Stay: Payer: Medicare Other | Attending: Internal Medicine | Admitting: Internal Medicine

## 2020-04-21 ENCOUNTER — Encounter: Payer: Self-pay | Admitting: Internal Medicine

## 2020-04-21 ENCOUNTER — Inpatient Hospital Stay: Payer: Medicare Other

## 2020-04-21 ENCOUNTER — Other Ambulatory Visit: Payer: Self-pay | Admitting: *Deleted

## 2020-04-21 ENCOUNTER — Telehealth: Payer: Self-pay | Admitting: Internal Medicine

## 2020-04-21 VITALS — BP 116/84 | HR 100 | Temp 97.8°F | Resp 20 | Ht 68.0 in | Wt 143.3 lb

## 2020-04-21 DIAGNOSIS — Z79899 Other long term (current) drug therapy: Secondary | ICD-10-CM | POA: Diagnosis not present

## 2020-04-21 DIAGNOSIS — Z5112 Encounter for antineoplastic immunotherapy: Secondary | ICD-10-CM | POA: Diagnosis not present

## 2020-04-21 DIAGNOSIS — Z23 Encounter for immunization: Secondary | ICD-10-CM | POA: Diagnosis not present

## 2020-04-21 DIAGNOSIS — T451X5A Adverse effect of antineoplastic and immunosuppressive drugs, initial encounter: Secondary | ICD-10-CM

## 2020-04-21 DIAGNOSIS — C3492 Malignant neoplasm of unspecified part of left bronchus or lung: Secondary | ICD-10-CM

## 2020-04-21 DIAGNOSIS — D6481 Anemia due to antineoplastic chemotherapy: Secondary | ICD-10-CM | POA: Insufficient documentation

## 2020-04-21 DIAGNOSIS — C3412 Malignant neoplasm of upper lobe, left bronchus or lung: Secondary | ICD-10-CM | POA: Insufficient documentation

## 2020-04-21 DIAGNOSIS — Z5111 Encounter for antineoplastic chemotherapy: Secondary | ICD-10-CM | POA: Diagnosis not present

## 2020-04-21 DIAGNOSIS — R000 Tachycardia, unspecified: Secondary | ICD-10-CM | POA: Insufficient documentation

## 2020-04-21 LAB — CMP (CANCER CENTER ONLY)
ALT: 18 U/L (ref 0–44)
AST: 19 U/L (ref 15–41)
Albumin: 3.3 g/dL — ABNORMAL LOW (ref 3.5–5.0)
Alkaline Phosphatase: 58 U/L (ref 38–126)
Anion gap: 9 (ref 5–15)
BUN: 19 mg/dL (ref 8–23)
CO2: 25 mmol/L (ref 22–32)
Calcium: 7.8 mg/dL — ABNORMAL LOW (ref 8.9–10.3)
Chloride: 107 mmol/L (ref 98–111)
Creatinine: 1.44 mg/dL — ABNORMAL HIGH (ref 0.44–1.00)
GFR, Estimated: 36 mL/min — ABNORMAL LOW (ref >60)
Glucose, Bld: 128 mg/dL — ABNORMAL HIGH (ref 70–99)
Potassium: 3.6 mmol/L (ref 3.5–5.1)
Sodium: 141 mmol/L (ref 135–145)
Total Bilirubin: <0.2 mg/dL — ABNORMAL LOW (ref 0.3–1.2)
Total Protein: 6.5 g/dL (ref 6.5–8.1)

## 2020-04-21 LAB — CBC WITH DIFFERENTIAL (CANCER CENTER ONLY)
Abs Immature Granulocytes: 0.14 10*3/uL — ABNORMAL HIGH (ref 0.00–0.07)
Basophils Absolute: 0 10*3/uL (ref 0.0–0.1)
Basophils Relative: 1 %
Eosinophils Absolute: 0.1 10*3/uL (ref 0.0–0.5)
Eosinophils Relative: 2 %
HCT: 24.8 % — ABNORMAL LOW (ref 36.0–46.0)
Hemoglobin: 8 g/dL — ABNORMAL LOW (ref 12.0–15.0)
Immature Granulocytes: 3 %
Lymphocytes Relative: 18 %
Lymphs Abs: 0.8 10*3/uL (ref 0.7–4.0)
MCH: 29 pg (ref 26.0–34.0)
MCHC: 32.3 g/dL (ref 30.0–36.0)
MCV: 89.9 fL (ref 80.0–100.0)
Monocytes Absolute: 1 10*3/uL (ref 0.1–1.0)
Monocytes Relative: 23 %
Neutro Abs: 2.3 10*3/uL (ref 1.7–7.7)
Neutrophils Relative %: 53 %
Platelet Count: 227 10*3/uL (ref 150–400)
RBC: 2.76 MIL/uL — ABNORMAL LOW (ref 3.87–5.11)
RDW: 19.6 % — ABNORMAL HIGH (ref 11.5–15.5)
WBC Count: 4.3 10*3/uL (ref 4.0–10.5)
nRBC: 0 % (ref 0.0–0.2)

## 2020-04-21 LAB — TSH: TSH: 0.777 u[IU]/mL (ref 0.308–3.960)

## 2020-04-21 MED ORDER — SODIUM CHLORIDE 0.9 % IV SOLN
400.0000 mg/m2 | Freq: Once | INTRAVENOUS | Status: AC
  Administered 2020-04-21: 700 mg via INTRAVENOUS
  Filled 2020-04-21: qty 8

## 2020-04-21 MED ORDER — SODIUM CHLORIDE 0.9 % IV SOLN
10.0000 mg | Freq: Once | INTRAVENOUS | Status: AC
  Administered 2020-04-21: 10 mg via INTRAVENOUS
  Filled 2020-04-21: qty 10

## 2020-04-21 MED ORDER — SODIUM CHLORIDE 0.9% FLUSH
10.0000 mL | INTRAVENOUS | Status: DC | PRN
  Administered 2020-04-21: 10 mL
  Filled 2020-04-21: qty 10

## 2020-04-21 MED ORDER — SODIUM CHLORIDE 0.9 % IV SOLN
200.0000 mg | Freq: Once | INTRAVENOUS | Status: AC
  Administered 2020-04-21: 200 mg via INTRAVENOUS
  Filled 2020-04-21: qty 8

## 2020-04-21 MED ORDER — INFLUENZA VAC A&B SA ADJ QUAD 0.5 ML IM PRSY
PREFILLED_SYRINGE | INTRAMUSCULAR | Status: AC
  Filled 2020-04-21: qty 0.5

## 2020-04-21 MED ORDER — SODIUM CHLORIDE 0.9 % IV SOLN
304.0000 mg | Freq: Once | INTRAVENOUS | Status: AC
  Administered 2020-04-21: 300 mg via INTRAVENOUS
  Filled 2020-04-21: qty 30

## 2020-04-21 MED ORDER — PALONOSETRON HCL INJECTION 0.25 MG/5ML
INTRAVENOUS | Status: AC
  Filled 2020-04-21: qty 5

## 2020-04-21 MED ORDER — PALONOSETRON HCL INJECTION 0.25 MG/5ML
0.2500 mg | Freq: Once | INTRAVENOUS | Status: AC
  Administered 2020-04-21: 0.25 mg via INTRAVENOUS

## 2020-04-21 MED ORDER — SODIUM CHLORIDE 0.9 % IV SOLN
Freq: Once | INTRAVENOUS | Status: AC
  Filled 2020-04-21: qty 250

## 2020-04-21 MED ORDER — SODIUM CHLORIDE 0.9 % IV SOLN
150.0000 mg | Freq: Once | INTRAVENOUS | Status: AC
  Administered 2020-04-21: 150 mg via INTRAVENOUS
  Filled 2020-04-21: qty 150

## 2020-04-21 MED ORDER — INFLUENZA VAC A&B SA ADJ QUAD 0.5 ML IM PRSY
0.5000 mL | PREFILLED_SYRINGE | Freq: Once | INTRAMUSCULAR | Status: AC
  Administered 2020-04-21: 0.5 mL via INTRAMUSCULAR

## 2020-04-21 MED ORDER — HEPARIN SOD (PORK) LOCK FLUSH 100 UNIT/ML IV SOLN
500.0000 [IU] | Freq: Once | INTRAVENOUS | Status: AC | PRN
  Administered 2020-04-21: 500 [IU]
  Filled 2020-04-21: qty 5

## 2020-04-21 NOTE — Telephone Encounter (Signed)
Scheduled appt per 10/6 sch msg - left message for patient with appt date and time

## 2020-04-21 NOTE — Progress Notes (Signed)
[facility] [phone]   [phone]  OFFICE PROGRESS NOTE  Celene Squibb, MD [address] [place] [address]  DIAGNOSIS: Stage IV (T3, N2, M1a) non-small cell lung cancer, poorly differentiated adenocarcinoma presented with large left upper lobe lung mass in addition to mediastinal lymphadenopathy and bilateral pulmonary nodules diagnosed in June 2021.  Biomarker Findings Tumor Mutational Burden - 10 Muts/Mb Microsatellite status - MS-Stable Genomic Findings For a complete list of the genes assayed, please refer to the Appendix. NF1 Q2582* KRAS G12V CTNNB1 A632fs*10 TP53 H179N 7 Disease relevant genes with no reportable alterations: ALK, BRAF, EGFR, ERBB2, MET, RET, ROS1  PDL1 Expression: 100%  PRIOR THERAPY: Palliative radiotherapy to the large left upper lobe lung mass.  CURRENT THERAPY: Systemic chemotherapy with carboplatin for AUC of 5, Alimta 500 mg/M2 and Keytruda 200 mg IV every 3 weeks.  First dose 02/12/2020.  Status post 3 cycles.  INTERVAL HISTORY: Kayla Sawyer 74 y.o. female returns to the clinic today for follow-up visit accompanied by her husband.  The patient is feeling fine today with no concerning complaints except for fatigue and shortness of breath with exertion.  She denied having any current chest pain, cough or hemoptysis.  She denied having any nausea, vomiting, diarrhea or constipation.  She has no headache or visual changes.  She denied having any significant weight loss or night sweats.  The patient continues to tolerate her systemic chemotherapy fairly well.  She had repeat CT scan of the chest, abdomen pelvis performed recently and she is here for evaluation and discussion of her risk her results.   MEDICAL HISTORY: Past Medical History:  Diagnosis Date  . Arthritis   . COPD (chronic obstructive pulmonary disease) ([facility])   . Depression   . E. coli pyelonephritis 6//2014  . GERD (gastroesophageal reflux  disease)   . Pneumonia   . Pulmonary hypertension ([place])   . Sepsis ([facility])     ALLERGIES:  is allergic to sulfur.  MEDICATIONS:  Current Outpatient Medications  Medication Sig Dispense Refill  . acetaminophen (TYLENOL) 500 MG tablet Take 500 mg by mouth every 6 (six) hours as needed for mild pain.    Marland Kitchen albuterol (PROVENTIL) (2.5 MG/3ML) 0.083% nebulizer solution Take 3 mLs (2.5 mg total) by nebulization every 6 (six) hours as needed for wheezing or shortness of breath. 75 mL 12  . aspirin EC 81 MG tablet Take 1 tablet (81 mg total) by mouth daily. (Patient not taking: Reported on 04/14/2020)    . atorvastatin (LIPITOR) 40 MG tablet Take 40 mg by mouth daily.    . folic acid (FOLVITE) 1 MG tablet Take 1 tablet (1 mg total) by mouth daily. 30 tablet 4  . levocetirizine (XYZAL) 5 MG tablet Take 5 mg by mouth every evening.     . lidocaine-prilocaine (EMLA) cream Apply to the Port-A-Cath site 30-60-minute before chemotherapy every 3 weeks. 30 g 0  . Multiple Vitamin (MULTIVITAMIN WITH MINERALS) TABS tablet Take 1 tablet by mouth daily.    Marland Kitchen omeprazole (PRILOSEC) 20 MG capsule Take 20 mg by mouth daily.     . prochlorperazine (COMPAZINE) 10 MG tablet Take 1 tablet (10 mg total) by mouth every 6 (six) hours as needed for nausea or vomiting. 30 tablet 0  . sertraline (ZOLOFT) 100 MG tablet Take 100 mg by mouth daily.     . TRELEGY ELLIPTA 100-62.5-25 MCG/INH AEPB Inhale 1 puff into the lungs daily.  No current facility-administered medications for this visit.    SURGICAL HISTORY:  Past Surgical History:  Procedure Laterality Date  . ABDOMINAL HYSTERECTOMY     partial  . AORTA - BILATERAL FEMORAL ARTERY BYPASS GRAFT Bilateral 06/14/2016   Procedure: AORTOBIFEMORAL BYPASS GRAFT AORTA SMA BYPASS GRAFT RE-IMPLANTATION  OF SUPERIOR MESENTERIC ARTERY;  Surgeon: Serafina Mitchell, MD;  Location: [facility];  Service: Vascular;  Laterality: Bilateral;  . BACK SURGERY     L4/5  . CARDIAC CATHETERIZATION  N/A 01/03/2016   Procedure: Right/Left Heart Cath and Coronary Angiography;  Surgeon: Jolaine Artist, MD;  Location: [facility];  Service: Cardiovascular;  Laterality: N/A;  . COLONOSCOPY N/A 08/05/2015   Procedure: COLONOSCOPY;  Surgeon: Rogene Houston, MD;  [facility];  Service: Endoscopy;  Laterality: N/A;  730  . ELBOW SURGERY    . IR IMAGING GUIDED PORT INSERTION  02/16/2020  . PERIPHERAL VASCULAR CATHETERIZATION N/A 02/16/2016   Procedure: Abdominal Aortogram w/Lower Extremity;  Surgeon: Wellington Hampshire, MD;  Location: [facility];  Service: Cardiovascular;  Laterality: N/A;    REVIEW OF SYSTEMS:  Constitutional: positive for fatigue Eyes: negative Ears, nose, mouth, throat, and face: negative Respiratory: positive for dyspnea on exertion Cardiovascular: negative Gastrointestinal: negative Genitourinary:negative Integument/breast: negative Hematologic/lymphatic: negative Musculoskeletal:negative Neurological: negative Behavioral/Psych: negative Endocrine: negative Allergic/Immunologic: negative   PHYSICAL EXAMINATION: General appearance: alert, cooperative, fatigued and no distress Head: Normocephalic, without obvious abnormality, atraumatic Neck: no adenopathy, no JVD, supple, symmetrical, trachea midline and thyroid not enlarged, symmetric, no tenderness/mass/nodules Lymph nodes: Cervical, supraclavicular, and axillary nodes normal. Resp: clear to auscultation bilaterally Back: symmetric, no curvature. ROM normal. No CVA tenderness. Cardio: regular rate and rhythm, S1, S2 normal, no murmur, click, rub or gallop GI: soft, non-tender; bowel sounds normal; no masses,  no organomegaly Extremities: extremities normal, atraumatic, no cyanosis or edema Neurologic: Alert and oriented X 3, normal strength and tone. Normal symmetric reflexes. Normal coordination and gait  ECOG PERFORMANCE STATUS: 1 - Symptomatic but completely ambulatory  Blood pressure  116/84, pulse 100, temperature 97.8 F (36.6 C), temperature source Tympanic, resp. rate 20, height $RemoveBe'5\' 8"'oCblmHbnw$  (1.727 m), weight 143 lb 4.8 oz (65 kg), SpO2 95 %.  LABORATORY DATA: Lab Results  Component Value Date   WBC 4.3 04/21/2020   HGB 8.0 (L) 04/21/2020   HCT 24.8 (L) 04/21/2020   MCV 89.9 04/21/2020   PLT 227 04/21/2020      Chemistry      Component Value Date/Time   NA 142 04/15/2020 1548   K 4.5 04/15/2020 1548   CL 107 04/15/2020 1548   CO2 28 04/15/2020 1548   BUN 28 (H) 04/15/2020 1548   CREATININE 1.44 (H) 04/15/2020 1548   CREATININE 0.90 02/08/2016 0949      Component Value Date/Time   CALCIUM 8.2 (L) 04/15/2020 1548   ALKPHOS 58 04/15/2020 1548   AST 22 04/15/2020 1548   ALT 23 04/15/2020 1548   BILITOT <0.2 (L) 04/15/2020 1548       RADIOGRAPHIC STUDIES: CT Chest W Contrast  Result Date: 04/19/2020 CLINICAL DATA:  Adenocarcinoma of left lung, stage IV. Non-small-cell. Poorly differentiated with mediastinal adenopathy and bilateral pulmonary nodules. Asymptomatic. Currently on chemotherapy. EXAM: CT CHEST, ABDOMEN, AND PELVIS WITH CONTRAST TECHNIQUE: Multidetector CT imaging of the chest, abdomen and pelvis was performed following the standard protocol during bolus administration of intravenous contrast. CONTRAST:  92mL OMNIPAQUE IOHEXOL 300 MG/ML  SOLN COMPARISON:  01/02/2020 PET.  CTA chest 12/18/2019. FINDINGS: CT CHEST FINDINGS Cardiovascular: Advanced aortic and branch vessel atherosclerosis. Tortuous thoracic aorta. Normal heart size with minimal pericardial fluid. Multivessel coronary artery atherosclerosis. Right Port-A-Cath tip mid right atrium. No central pulmonary embolism, on this non-dedicated study. Mediastinum/Nodes: No supraclavicular adenopathy. No mediastinal or hilar adenopathy. The hypermetabolic AP window node on the prior PET has resolved. Fluid level in the esophagus on 28/2. Lungs/Pleura: No pleural fluid. Advanced bullous emphysema. Response  to therapy of pulmonary metastasis. Primarily resolution of previously described pulmonary nodules. A 3 mm nodule on the right minor fissure on 84/6 measured 5 mm on the prior PET (when remeasured). Minimal residual nodularity in the left lower lobe at 5 mm on 104/6. This is at the site of a 1.4 cm nodule on the prior PET (when remeasured). The peripheral left upper lobe primary is significantly decreased. Residual soft tissue thickening and volume loss for example at 3.7 x 3.3 cm on 44/6, at the site of a 6.3 x 6.1 cm mass on the prior PET (when remeasured). Musculoskeletal: Osteopenia. CT ABDOMEN PELVIS FINDINGS Hepatobiliary: Normal liver. Normal gallbladder, without biliary ductal dilatation. Pancreas: Normal, without mass or ductal dilatation. Spleen: Normal in size, without focal abnormality. Adrenals/Urinary Tract: Normal adrenal glands. Renal cortical scarring bilaterally. Lower pole right and probable lower pole left punctate renal collecting system calculi. No hydronephrosis. Stomach/Bowel: Proximal gastric underdistention. Normal colon and terminal ileum. Normal small bowel. Vascular/Lymphatic: Advanced aortic and branch vessel atherosclerosis. Status post aortic bypass. No abdominopelvic adenopathy. Reproductive: Hysterectomy.  No adnexal mass. Other: No significant free fluid. Mild pelvic floor laxity. No evidence of omental or peritoneal disease. Musculoskeletal: Osteopenia. Lumbosacral spondylosis with trace L3-4 anterolisthesis. IMPRESSION: 1. Response to therapy of left upper lobe primary, pulmonary and thoracic nodal metastasis. 2. No acute process or evidence of metastatic disease in the abdomen or pelvis. 3. Bilateral nephrolithiasis. 4. Aortic Atherosclerosis (ICD10-I70.0) and Emphysema (ICD10-J43.9). Coronary artery atherosclerosis. Electronically Signed   By: Abigail Miyamoto M.D.   On: 04/19/2020 16:37   CT Abdomen Pelvis W Contrast  Result Date: 04/19/2020 CLINICAL DATA:  Adenocarcinoma of  left lung, stage IV. Non-small-cell. Poorly differentiated with mediastinal adenopathy and bilateral pulmonary nodules. Asymptomatic. Currently on chemotherapy. EXAM: CT CHEST, ABDOMEN, AND PELVIS WITH CONTRAST TECHNIQUE: Multidetector CT imaging of the chest, abdomen and pelvis was performed following the standard protocol during bolus administration of intravenous contrast. CONTRAST:  60mL OMNIPAQUE IOHEXOL 300 MG/ML  SOLN COMPARISON:  01/02/2020 PET.  CTA chest 12/18/2019. FINDINGS: CT CHEST FINDINGS Cardiovascular: Advanced aortic and branch vessel atherosclerosis. Tortuous thoracic aorta. Normal heart size with minimal pericardial fluid. Multivessel coronary artery atherosclerosis. Right Port-A-Cath tip mid right atrium. No central pulmonary embolism, on this non-dedicated study. Mediastinum/Nodes: No supraclavicular adenopathy. No mediastinal or hilar adenopathy. The hypermetabolic AP window node on the prior PET has resolved. Fluid level in the esophagus on 28/2. Lungs/Pleura: No pleural fluid. Advanced bullous emphysema. Response to therapy of pulmonary metastasis. Primarily resolution of previously described pulmonary nodules. A 3 mm nodule on the right minor fissure on 84/6 measured 5 mm on the prior PET (when remeasured). Minimal residual nodularity in the left lower lobe at 5 mm on 104/6. This is at the site of a 1.4 cm nodule on the prior PET (when remeasured). The peripheral left upper lobe primary is significantly decreased. Residual soft tissue thickening and volume loss for example at 3.7 x 3.3 cm on 44/6, at the site of a 6.3 x 6.1 cm mass on the prior  PET (when remeasured). Musculoskeletal: Osteopenia. CT ABDOMEN PELVIS FINDINGS Hepatobiliary: Normal liver. Normal gallbladder, without biliary ductal dilatation. Pancreas: Normal, without mass or ductal dilatation. Spleen: Normal in size, without focal abnormality. Adrenals/Urinary Tract: Normal adrenal glands. Renal cortical scarring bilaterally.  Lower pole right and probable lower pole left punctate renal collecting system calculi. No hydronephrosis. Stomach/Bowel: Proximal gastric underdistention. Normal colon and terminal ileum. Normal small bowel. Vascular/Lymphatic: Advanced aortic and branch vessel atherosclerosis. Status post aortic bypass. No abdominopelvic adenopathy. Reproductive: Hysterectomy.  No adnexal mass. Other: No significant free fluid. Mild pelvic floor laxity. No evidence of omental or peritoneal disease. Musculoskeletal: Osteopenia. Lumbosacral spondylosis with trace L3-4 anterolisthesis. IMPRESSION: 1. Response to therapy of left upper lobe primary, pulmonary and thoracic nodal metastasis. 2. No acute process or evidence of metastatic disease in the abdomen or pelvis. 3. Bilateral nephrolithiasis. 4. Aortic Atherosclerosis (ICD10-I70.0) and Emphysema (ICD10-J43.9). Coronary artery atherosclerosis. Electronically Signed   By: Abigail Miyamoto M.D.   On: 04/19/2020 16:37    ASSESSMENT AND PLAN: This is a very pleasant 74 years old white female with a stage IV (T3, N2, M1a) non-small cell lung cancer, poorly differentiated adenocarcinoma diagnosed in June 2021 and presented with large left upper lobe lung mass with mediastinal invasion in addition to mediastinal lymphadenopathy as well as bilateral pulmonary nodules. The patient has no actionable mutations but PD-L1 expression is 100%. Because of the bulky disease I recommended for the patient to consider the combination chemotherapy.  She is interested in this option. She is currently being treated with carboplatin for AUC of 5, Alimta 500 mg/M2 and Keytruda 200 mg IV every 3 weeks status post 3 cycles.   The patient continues to tolerate her treatment fairly well with no concerning adverse effects except for fatigue. She had repeat CT scan of the chest, abdomen pelvis performed recently.  I personally and independently reviewed the scans and discussed the results with the patient  and her husband. Her scan showed significant improvement in her disease. I recommended for the patient to continue her current treatment and she will proceed with cycle #4 of carboplatin, Alimta and Keytruda today as planned but I will reduce the dose of Alimta to 400 mg/M2 because of the renal insufficiency.  Starting from cycle #5 the patient will be treated with maintenance Alimta and Keytruda every 3 weeks. For the chemotherapy-induced anemia, we will arrange for the patient to receive 2 units of PRBCs transfusion later this week. She will come back for follow-up visit in 3 weeks for evaluation before starting cycle #5. The patient was advised to call immediately if she has any concerning symptoms in the interval. The patient voices understanding of current disease status and treatment options and is in agreement with the current care plan. All questions were answered. The patient knows to call the clinic with any problems, questions or concerns. We can certainly see the patient much sooner if necessary.  Disclaimer: This note was dictated with voice recognition software. Similar sounding words can inadvertently be transcribed and may not be corrected upon review.

## 2020-04-21 NOTE — Patient Instructions (Signed)
[facility] Discharge Instructions for Patients Receiving Chemotherapy  Today you received the following chemotherapy agents: Keytruda, Alimta, and Carboplatin  To help prevent nausea and vomiting after your treatment, we encourage you to take your nausea medication  as prescribed.    If you develop nausea and vomiting that is not controlled by your nausea medication, call the clinic.   BELOW ARE SYMPTOMS THAT SHOULD BE REPORTED IMMEDIATELY:  *FEVER GREATER THAN 100.5 F  *CHILLS WITH OR WITHOUT FEVER  NAUSEA AND VOMITING THAT IS NOT CONTROLLED WITH YOUR NAUSEA MEDICATION  *UNUSUAL SHORTNESS OF BREATH  *UNUSUAL BRUISING OR BLEEDING  TENDERNESS IN MOUTH AND THROAT WITH OR WITHOUT PRESENCE OF ULCERS  *URINARY PROBLEMS  *BOWEL PROBLEMS  UNUSUAL RASH Items with * indicate a potential emergency and should be followed up as soon as possible.  Feel free to call the clinic should you have any questions or concerns. The clinic phone number is [phone].  Please show the [facility] at check-in to the Emergency Department and triage nurse.

## 2020-04-23 ENCOUNTER — Inpatient Hospital Stay: Payer: Medicare Other

## 2020-04-23 ENCOUNTER — Other Ambulatory Visit: Payer: Self-pay

## 2020-04-23 DIAGNOSIS — T451X5A Adverse effect of antineoplastic and immunosuppressive drugs, initial encounter: Secondary | ICD-10-CM

## 2020-04-23 DIAGNOSIS — Z79899 Other long term (current) drug therapy: Secondary | ICD-10-CM | POA: Diagnosis not present

## 2020-04-23 DIAGNOSIS — D6481 Anemia due to antineoplastic chemotherapy: Secondary | ICD-10-CM | POA: Diagnosis not present

## 2020-04-23 DIAGNOSIS — C3492 Malignant neoplasm of unspecified part of left bronchus or lung: Secondary | ICD-10-CM

## 2020-04-23 DIAGNOSIS — Z5112 Encounter for antineoplastic immunotherapy: Secondary | ICD-10-CM | POA: Diagnosis not present

## 2020-04-23 DIAGNOSIS — R000 Tachycardia, unspecified: Secondary | ICD-10-CM | POA: Diagnosis not present

## 2020-04-23 DIAGNOSIS — C3412 Malignant neoplasm of upper lobe, left bronchus or lung: Secondary | ICD-10-CM | POA: Diagnosis not present

## 2020-04-23 DIAGNOSIS — Z23 Encounter for immunization: Secondary | ICD-10-CM | POA: Diagnosis not present

## 2020-04-23 DIAGNOSIS — Z5111 Encounter for antineoplastic chemotherapy: Secondary | ICD-10-CM | POA: Diagnosis not present

## 2020-04-23 LAB — CBC WITH DIFFERENTIAL (CANCER CENTER ONLY)
Abs Immature Granulocytes: 0.04 10*3/uL (ref 0.00–0.07)
Basophils Absolute: 0 10*3/uL (ref 0.0–0.1)
Basophils Relative: 0 %
Eosinophils Absolute: 0.1 10*3/uL (ref 0.0–0.5)
Eosinophils Relative: 1 %
HCT: 27.3 % — ABNORMAL LOW (ref 36.0–46.0)
Hemoglobin: 8.8 g/dL — ABNORMAL LOW (ref 12.0–15.0)
Immature Granulocytes: 1 %
Lymphocytes Relative: 12 %
Lymphs Abs: 0.7 10*3/uL (ref 0.7–4.0)
MCH: 29.4 pg (ref 26.0–34.0)
MCHC: 32.2 g/dL (ref 30.0–36.0)
MCV: 91.3 fL (ref 80.0–100.0)
Monocytes Absolute: 0.6 10*3/uL (ref 0.1–1.0)
Monocytes Relative: 10 %
Neutro Abs: 4.4 10*3/uL (ref 1.7–7.7)
Neutrophils Relative %: 76 %
Platelet Count: 298 10*3/uL (ref 150–400)
RBC: 2.99 MIL/uL — ABNORMAL LOW (ref 3.87–5.11)
RDW: 20.7 % — ABNORMAL HIGH (ref 11.5–15.5)
WBC Count: 5.8 10*3/uL (ref 4.0–10.5)
nRBC: 0 % (ref 0.0–0.2)

## 2020-04-23 LAB — CMP (CANCER CENTER ONLY)
ALT: 16 U/L (ref 0–44)
AST: 20 U/L (ref 15–41)
Albumin: 3.4 g/dL — ABNORMAL LOW (ref 3.5–5.0)
Alkaline Phosphatase: 56 U/L (ref 38–126)
Anion gap: 9 (ref 5–15)
BUN: 24 mg/dL — ABNORMAL HIGH (ref 8–23)
CO2: 25 mmol/L (ref 22–32)
Calcium: 7.9 mg/dL — ABNORMAL LOW (ref 8.9–10.3)
Chloride: 107 mmol/L (ref 98–111)
Creatinine: 1.52 mg/dL — ABNORMAL HIGH (ref 0.44–1.00)
GFR, Estimated: 34 mL/min — ABNORMAL LOW (ref >60)
Glucose, Bld: 86 mg/dL (ref 70–99)
Potassium: 4 mmol/L (ref 3.5–5.1)
Sodium: 141 mmol/L (ref 135–145)
Total Bilirubin: 0.2 mg/dL — ABNORMAL LOW (ref 0.3–1.2)
Total Protein: 6.9 g/dL (ref 6.5–8.1)

## 2020-04-23 LAB — PREPARE RBC (CROSSMATCH)

## 2020-04-24 ENCOUNTER — Inpatient Hospital Stay: Payer: Medicare Other

## 2020-04-24 ENCOUNTER — Other Ambulatory Visit: Payer: Self-pay

## 2020-04-24 DIAGNOSIS — D6481 Anemia due to antineoplastic chemotherapy: Secondary | ICD-10-CM | POA: Diagnosis not present

## 2020-04-24 DIAGNOSIS — R000 Tachycardia, unspecified: Secondary | ICD-10-CM | POA: Diagnosis not present

## 2020-04-24 DIAGNOSIS — Z5112 Encounter for antineoplastic immunotherapy: Secondary | ICD-10-CM | POA: Diagnosis not present

## 2020-04-24 DIAGNOSIS — Z23 Encounter for immunization: Secondary | ICD-10-CM | POA: Diagnosis not present

## 2020-04-24 DIAGNOSIS — Z79899 Other long term (current) drug therapy: Secondary | ICD-10-CM | POA: Diagnosis not present

## 2020-04-24 DIAGNOSIS — Z5111 Encounter for antineoplastic chemotherapy: Secondary | ICD-10-CM | POA: Diagnosis not present

## 2020-04-24 DIAGNOSIS — C3412 Malignant neoplasm of upper lobe, left bronchus or lung: Secondary | ICD-10-CM | POA: Diagnosis not present

## 2020-04-24 MED ORDER — SODIUM CHLORIDE 0.9% FLUSH
10.0000 mL | INTRAVENOUS | Status: AC | PRN
  Administered 2020-04-24: 10 mL
  Filled 2020-04-24: qty 10

## 2020-04-24 MED ORDER — ACETAMINOPHEN 325 MG PO TABS
650.0000 mg | ORAL_TABLET | Freq: Once | ORAL | Status: AC
  Administered 2020-04-24: 650 mg via ORAL

## 2020-04-24 MED ORDER — SODIUM CHLORIDE 0.9% IV SOLUTION
250.0000 mL | Freq: Once | INTRAVENOUS | Status: AC
  Administered 2020-04-24: 250 mL via INTRAVENOUS
  Filled 2020-04-24: qty 250

## 2020-04-24 MED ORDER — HEPARIN SOD (PORK) LOCK FLUSH 100 UNIT/ML IV SOLN
500.0000 [IU] | Freq: Every day | INTRAVENOUS | Status: AC | PRN
  Administered 2020-04-24: 500 [IU]
  Filled 2020-04-24: qty 5

## 2020-04-24 MED ORDER — DIPHENHYDRAMINE HCL 25 MG PO CAPS
25.0000 mg | ORAL_CAPSULE | Freq: Once | ORAL | Status: AC
  Administered 2020-04-24: 25 mg via ORAL

## 2020-04-24 NOTE — Patient Instructions (Signed)

## 2020-04-25 LAB — TYPE AND SCREEN
ABO/RH(D): O POS
Antibody Screen: NEGATIVE
Unit division: 0
Unit division: 0

## 2020-04-25 LAB — BPAM RBC
Blood Product Expiration Date: 202111032359
Blood Product Expiration Date: 202111032359
ISSUE DATE / TIME: 202110090934
ISSUE DATE / TIME: 202110090934
Unit Type and Rh: 5100
Unit Type and Rh: 5100

## 2020-04-29 ENCOUNTER — Inpatient Hospital Stay: Payer: Medicare Other

## 2020-04-29 ENCOUNTER — Other Ambulatory Visit: Payer: Self-pay

## 2020-04-29 ENCOUNTER — Other Ambulatory Visit: Payer: Medicare Other

## 2020-04-29 DIAGNOSIS — D6481 Anemia due to antineoplastic chemotherapy: Secondary | ICD-10-CM | POA: Diagnosis not present

## 2020-04-29 DIAGNOSIS — Z5111 Encounter for antineoplastic chemotherapy: Secondary | ICD-10-CM | POA: Diagnosis not present

## 2020-04-29 DIAGNOSIS — Z23 Encounter for immunization: Secondary | ICD-10-CM | POA: Diagnosis not present

## 2020-04-29 DIAGNOSIS — R000 Tachycardia, unspecified: Secondary | ICD-10-CM | POA: Diagnosis not present

## 2020-04-29 DIAGNOSIS — Z79899 Other long term (current) drug therapy: Secondary | ICD-10-CM | POA: Diagnosis not present

## 2020-04-29 DIAGNOSIS — Z5112 Encounter for antineoplastic immunotherapy: Secondary | ICD-10-CM | POA: Diagnosis not present

## 2020-04-29 DIAGNOSIS — C3492 Malignant neoplasm of unspecified part of left bronchus or lung: Secondary | ICD-10-CM

## 2020-04-29 DIAGNOSIS — C3412 Malignant neoplasm of upper lobe, left bronchus or lung: Secondary | ICD-10-CM | POA: Diagnosis not present

## 2020-04-29 LAB — CMP (CANCER CENTER ONLY)
ALT: 28 U/L (ref 0–44)
AST: 32 U/L (ref 15–41)
Albumin: 3.4 g/dL — ABNORMAL LOW (ref 3.5–5.0)
Alkaline Phosphatase: 57 U/L (ref 38–126)
Anion gap: 8 (ref 5–15)
BUN: 39 mg/dL — ABNORMAL HIGH (ref 8–23)
CO2: 27 mmol/L (ref 22–32)
Calcium: 7.9 mg/dL — ABNORMAL LOW (ref 8.9–10.3)
Chloride: 106 mmol/L (ref 98–111)
Creatinine: 1.47 mg/dL — ABNORMAL HIGH (ref 0.44–1.00)
GFR, Estimated: 35 mL/min — ABNORMAL LOW (ref >60)
Glucose, Bld: 130 mg/dL — ABNORMAL HIGH (ref 70–99)
Potassium: 4 mmol/L (ref 3.5–5.1)
Sodium: 141 mmol/L (ref 135–145)
Total Bilirubin: 0.4 mg/dL (ref 0.3–1.2)
Total Protein: 6.9 g/dL (ref 6.5–8.1)

## 2020-04-29 LAB — CBC WITH DIFFERENTIAL (CANCER CENTER ONLY)
Abs Immature Granulocytes: 0.01 10*3/uL (ref 0.00–0.07)
Basophils Absolute: 0 10*3/uL (ref 0.0–0.1)
Basophils Relative: 1 %
Eosinophils Absolute: 0.1 10*3/uL (ref 0.0–0.5)
Eosinophils Relative: 3 %
HCT: 34.8 % — ABNORMAL LOW (ref 36.0–46.0)
Hemoglobin: 11.2 g/dL — ABNORMAL LOW (ref 12.0–15.0)
Immature Granulocytes: 1 %
Lymphocytes Relative: 28 %
Lymphs Abs: 0.5 10*3/uL — ABNORMAL LOW (ref 0.7–4.0)
MCH: 29.3 pg (ref 26.0–34.0)
MCHC: 32.2 g/dL (ref 30.0–36.0)
MCV: 91.1 fL (ref 80.0–100.0)
Monocytes Absolute: 0.2 10*3/uL (ref 0.1–1.0)
Monocytes Relative: 11 %
Neutro Abs: 1 10*3/uL — ABNORMAL LOW (ref 1.7–7.7)
Neutrophils Relative %: 56 %
Platelet Count: 103 10*3/uL — ABNORMAL LOW (ref 150–400)
RBC: 3.82 MIL/uL — ABNORMAL LOW (ref 3.87–5.11)
RDW: 17.8 % — ABNORMAL HIGH (ref 11.5–15.5)
WBC Count: 1.8 10*3/uL — ABNORMAL LOW (ref 4.0–10.5)
nRBC: 0 % (ref 0.0–0.2)

## 2020-05-03 DIAGNOSIS — N1831 Chronic kidney disease, stage 3a: Secondary | ICD-10-CM | POA: Diagnosis not present

## 2020-05-03 DIAGNOSIS — J441 Chronic obstructive pulmonary disease with (acute) exacerbation: Secondary | ICD-10-CM | POA: Diagnosis not present

## 2020-05-03 DIAGNOSIS — S99922A Unspecified injury of left foot, initial encounter: Secondary | ICD-10-CM | POA: Diagnosis not present

## 2020-05-03 DIAGNOSIS — H6012 Cellulitis of left external ear: Secondary | ICD-10-CM | POA: Diagnosis not present

## 2020-05-03 DIAGNOSIS — E782 Mixed hyperlipidemia: Secondary | ICD-10-CM | POA: Diagnosis not present

## 2020-05-03 DIAGNOSIS — F17218 Nicotine dependence, cigarettes, with other nicotine-induced disorders: Secondary | ICD-10-CM | POA: Diagnosis not present

## 2020-05-03 DIAGNOSIS — R7303 Prediabetes: Secondary | ICD-10-CM | POA: Diagnosis not present

## 2020-05-06 ENCOUNTER — Inpatient Hospital Stay: Payer: Medicare Other

## 2020-05-06 ENCOUNTER — Other Ambulatory Visit: Payer: Self-pay

## 2020-05-06 ENCOUNTER — Other Ambulatory Visit: Payer: Medicare Other

## 2020-05-06 DIAGNOSIS — C3492 Malignant neoplasm of unspecified part of left bronchus or lung: Secondary | ICD-10-CM

## 2020-05-06 DIAGNOSIS — C3412 Malignant neoplasm of upper lobe, left bronchus or lung: Secondary | ICD-10-CM | POA: Diagnosis not present

## 2020-05-06 DIAGNOSIS — D6481 Anemia due to antineoplastic chemotherapy: Secondary | ICD-10-CM | POA: Diagnosis not present

## 2020-05-06 DIAGNOSIS — Z23 Encounter for immunization: Secondary | ICD-10-CM | POA: Diagnosis not present

## 2020-05-06 DIAGNOSIS — Z5112 Encounter for antineoplastic immunotherapy: Secondary | ICD-10-CM | POA: Diagnosis not present

## 2020-05-06 DIAGNOSIS — R000 Tachycardia, unspecified: Secondary | ICD-10-CM | POA: Diagnosis not present

## 2020-05-06 DIAGNOSIS — Z5111 Encounter for antineoplastic chemotherapy: Secondary | ICD-10-CM | POA: Diagnosis not present

## 2020-05-06 DIAGNOSIS — Z79899 Other long term (current) drug therapy: Secondary | ICD-10-CM | POA: Diagnosis not present

## 2020-05-06 LAB — CBC WITH DIFFERENTIAL (CANCER CENTER ONLY)
Abs Immature Granulocytes: 0.01 10*3/uL (ref 0.00–0.07)
Basophils Absolute: 0 10*3/uL (ref 0.0–0.1)
Basophils Relative: 0 %
Eosinophils Absolute: 0 10*3/uL (ref 0.0–0.5)
Eosinophils Relative: 2 %
HCT: 31.3 % — ABNORMAL LOW (ref 36.0–46.0)
Hemoglobin: 10.2 g/dL — ABNORMAL LOW (ref 12.0–15.0)
Immature Granulocytes: 0 %
Lymphocytes Relative: 26 %
Lymphs Abs: 0.7 10*3/uL (ref 0.7–4.0)
MCH: 29 pg (ref 26.0–34.0)
MCHC: 32.6 g/dL (ref 30.0–36.0)
MCV: 88.9 fL (ref 80.0–100.0)
Monocytes Absolute: 0.8 10*3/uL (ref 0.1–1.0)
Monocytes Relative: 29 %
Neutro Abs: 1.1 10*3/uL — ABNORMAL LOW (ref 1.7–7.7)
Neutrophils Relative %: 43 %
Platelet Count: 50 10*3/uL — ABNORMAL LOW (ref 150–400)
RBC: 3.52 MIL/uL — ABNORMAL LOW (ref 3.87–5.11)
RDW: 17.6 % — ABNORMAL HIGH (ref 11.5–15.5)
WBC Count: 2.6 10*3/uL — ABNORMAL LOW (ref 4.0–10.5)
nRBC: 0 % (ref 0.0–0.2)

## 2020-05-06 LAB — CMP (CANCER CENTER ONLY)
ALT: 40 U/L (ref 0–44)
AST: 38 U/L (ref 15–41)
Albumin: 3.5 g/dL (ref 3.5–5.0)
Alkaline Phosphatase: 62 U/L (ref 38–126)
Anion gap: 12 (ref 5–15)
BUN: 30 mg/dL — ABNORMAL HIGH (ref 8–23)
CO2: 24 mmol/L (ref 22–32)
Calcium: 6.9 mg/dL — ABNORMAL LOW (ref 8.9–10.3)
Chloride: 107 mmol/L (ref 98–111)
Creatinine: 1.51 mg/dL — ABNORMAL HIGH (ref 0.44–1.00)
GFR, Estimated: 36 mL/min — ABNORMAL LOW (ref >60)
Glucose, Bld: 118 mg/dL — ABNORMAL HIGH (ref 70–99)
Potassium: 3.6 mmol/L (ref 3.5–5.1)
Sodium: 143 mmol/L (ref 135–145)
Total Bilirubin: 0.2 mg/dL — ABNORMAL LOW (ref 0.3–1.2)
Total Protein: 6.9 g/dL (ref 6.5–8.1)

## 2020-05-10 DIAGNOSIS — J449 Chronic obstructive pulmonary disease, unspecified: Secondary | ICD-10-CM | POA: Diagnosis not present

## 2020-05-10 DIAGNOSIS — J849 Interstitial pulmonary disease, unspecified: Secondary | ICD-10-CM | POA: Diagnosis not present

## 2020-05-10 DIAGNOSIS — J432 Centrilobular emphysema: Secondary | ICD-10-CM | POA: Diagnosis not present

## 2020-05-11 NOTE — Progress Notes (Signed)
[facility] OFFICE PROGRESS NOTE  Celene Squibb, MD [address] [place] [address]  DIAGNOSIS: Stage IV (T3, N2,M1a)non-small cell lung cancer, poorly differentiated adenocarcinoma presented with large left upper lobe lung mass in addition to mediastinal lymphadenopathy and bilateral pulmonary nodules diagnosed in June 2021.  Biomarker Findings Tumor Mutational Burden - 10 Muts/Mb Microsatellite status - MS-Stable Genomic Findings For a complete list of the genes assayed, please refer to the Appendix. NF1 Q2582* KRAS G12V CTNNB1 A620fs*10 TP53 H179N 7 Disease relevant genes with no reportable alterations: ALK, BRAF, EGFR, ERBB2, MET, RET, ROS1  PDL1 Expression: 100%  PRIOR THERAPY: Palliative radiotherapy to the large left upper lobe lung mass.  CURRENT THERAPY: Systemic chemotherapy with carboplatin for AUC of 5, Alimta 400 mg/M2 and Keytruda 200 mg IV every 3 weeks. First dose 02/12/2020. Status post 4cycles. Starting from cycle #5, she will be on maintenance alimta and [place]. She is on reduced dose of Alimta due to her renal insufficiency.   INTERVAL HISTORY: Kayla Sawyer 74 y.o. female returns to the clinic today for a follow-up visit accompanied by her husband.  The patient is feeling fairly well today without any concerning complaints.  Her [place] her [place] the [place] when she is feeling a little more worn out today.  She has been tolerating her chemotherapy well without any concerning adverse side effects except for fatigue.  She recently had a follow-up appointment with her pulmonologist regarding her COPD. She also saw her PCP for a lesion on her left ear. She was referred to dermatology for this for consideration of a biopsy. The patient also had a 1 month follow-up visit with Dr. Sondra Come, from radiation oncology. She has some radiation dermatitis. Today, the patient denies any fever, chills, or night sweats.  Her  weight is stable. She reports her baseline dyspnea on exertion for which she is currently on 2 L home oxygen via nasal cannula.  She reports her baseline mild productive cough. She denies any chest pain or palpitations. She denies any nausea, vomiting, diarrhea, or constipation.  She denies any headache or visual changes.  She denies any rashes or skin changes except for the radiation dermatitis on her back.  The patient is here today for evaluation before starting cycle #5.   MEDICAL HISTORY: Past Medical History:  Diagnosis Date  . Arthritis   . COPD (chronic obstructive pulmonary disease) ([place])   . Depression   . E. coli pyelonephritis 6//2014  . GERD (gastroesophageal reflux disease)   . Pneumonia   . Pulmonary hypertension ([place])   . Sepsis ([place])     ALLERGIES:  is allergic to sulfur.  MEDICATIONS:  Current Outpatient Medications  Medication Sig Dispense Refill  . acetaminophen (TYLENOL) 500 MG tablet Take 500 mg by mouth every 6 (six) hours as needed for mild pain.    Marland Kitchen albuterol (PROVENTIL) (2.5 MG/3ML) 0.083% nebulizer solution Take 3 mLs (2.5 mg total) by nebulization every 6 (six) hours as needed for wheezing or shortness of breath. 75 mL 12  . aspirin EC 81 MG tablet Take 1 tablet (81 mg total) by mouth daily.    Marland Kitchen atorvastatin (LIPITOR) 40 MG tablet Take 40 mg by mouth daily.    . cephALEXin (KEFLEX) 500 MG capsule Take 500 mg by mouth 2 (two) times daily.    . folic acid (FOLVITE) 1 MG tablet Take 1 tablet (1 mg total) by mouth daily. [place]  tablet 4  . levocetirizine (XYZAL) 5 MG tablet Take 5 mg by mouth every evening.     . lidocaine-prilocaine (EMLA) cream Apply to the Port-A-Cath site 30-60-minute before chemotherapy every 3 weeks. 30 g 0  . Multiple Vitamin (MULTIVITAMIN WITH MINERALS) TABS tablet Take 1 tablet by mouth daily.    . mupirocin ointment (BACTROBAN) 2 % SMARTSIG:1 Application Topical 2-3 Times Daily    . omeprazole (PRILOSEC) 20 MG capsule Take 20 mg by mouth  daily.     . prochlorperazine (COMPAZINE) 10 MG tablet Take 1 tablet (10 mg total) by mouth every 6 (six) hours as needed for nausea or vomiting. 30 tablet 0  . sertraline (ZOLOFT) 100 MG tablet Take 100 mg by mouth daily.     . TRELEGY ELLIPTA 100-62.5-25 MCG/INH AEPB Inhale 1 puff into the lungs daily.     No current facility-administered medications for this visit.   Facility-Administered Medications Ordered in Other Visits  Medication Dose Route Frequency Provider Last Rate Last Admin  . heparin lock flush 100 unit/mL  500 Units Intracatheter Once PRN Curt Bears, MD      . pembrolizumab [facility]) 200 mg in sodium chloride 0.9 % 50 mL chemo infusion  200 mg Intravenous Once Curt Bears, MD      . PEMEtrexed (ALIMTA) 700 mg in sodium chloride 0.9 % 100 mL chemo infusion  400 mg/m2 (Treatment Plan Recorded) Intravenous Once Curt Bears, MD      . sodium chloride flush (NS) 0.9 % injection 10 mL  10 mL Intracatheter PRN Curt Bears, MD        SURGICAL HISTORY:  Past Surgical History:  Procedure Laterality Date  . ABDOMINAL HYSTERECTOMY     partial  . AORTA - BILATERAL FEMORAL ARTERY BYPASS GRAFT Bilateral 06/14/2016   Procedure: AORTOBIFEMORAL BYPASS GRAFT AORTA SMA BYPASS GRAFT RE-IMPLANTATION  OF SUPERIOR MESENTERIC ARTERY;  Surgeon: Serafina Mitchell, MD;  Location: [facility];  Service: Vascular;  Laterality: Bilateral;  . BACK SURGERY     L4/5  . CARDIAC CATHETERIZATION N/A 01/03/2016   Procedure: Right/Left Heart Cath and Coronary Angiography;  Surgeon: Jolaine Artist, MD;  Location: [facility];  Service: Cardiovascular;  Laterality: N/A;  . COLONOSCOPY N/A 08/05/2015   Procedure: COLONOSCOPY;  Surgeon: Rogene Houston, MD;  [facility];  Service: Endoscopy;  Laterality: N/A;  730  . ELBOW SURGERY    . IR IMAGING GUIDED PORT INSERTION  02/16/2020  . PERIPHERAL VASCULAR CATHETERIZATION N/A 02/16/2016   Procedure: Abdominal Aortogram w/Lower  Extremity;  Surgeon: Wellington Hampshire, MD;  Location: [facility];  Service: Cardiovascular;  Laterality: N/A;    REVIEW OF SYSTEMS:   Review of Systems  Constitutional: Positive for fatigue.  Negative for appetite change, chills, fever and unexpected weight change.  HENT:   Negative for mouth sores, nosebleeds, sore throat and trouble swallowing.   Eyes: Negative for eye problems and icterus.  Respiratory: Positive for mild dyspnea on exertion and a mild cough.  Negative for hemoptysis, and wheezing.   Cardiovascular: Negative for chest pain and leg swelling.  Gastrointestinal: Negative for abdominal pain, constipation, diarrhea, nausea and vomiting.  Genitourinary: Negative for bladder incontinence, difficulty urinating, dysuria, frequency and hematuria.   Musculoskeletal: Negative for back pain, gait problem, neck pain and neck stiffness.  Skin: Negative for itching and rash. Skin lesion on left ear.  Neurological: Occasional mild headache.  Negative for dizziness, extremity weakness, gait problem, light-headedness and seizures.  Hematological: Negative for adenopathy. Does not bruise/bleed easily.  Psychiatric/Behavioral: Negative for confusion, depression and sleep disturbance. The patient is not nervous/anxious.     PHYSICAL EXAMINATION:  Blood pressure 114/60, pulse (!) 123, temperature (!) 97 F (36.1 C), temperature source Tympanic, resp. rate 18, height 5\' 8"  (1.727 m), weight 145 lb 3.2 oz (65.9 kg), SpO2 97 %.  ECOG PERFORMANCE STATUS: 2 - Symptomatic, <50% confined to bed  Physical Exam  Constitutional: Oriented to person, place, and time and elderly appearing female and in no distress.  HENT:  Head: Normocephalic and atraumatic.  Mouth/Throat: Oropharynx is clear and moist. No oropharyngeal exudate.  Eyes: Conjunctivae are normal. Right eye exhibits no discharge. Left eye exhibits no discharge. No scleral icterus.  Neck: Normal range of motion. Neck supple.   Cardiovascular: Tachycardic, regular rhythm, normal heart sounds and intact distal pulses.   Pulmonary/Chest: Effort normal. Quiet breath sounds in all lung fields. No respiratory distress. No wheezes. No rales. On 2 L via nasal cannula.  Abdominal: Soft. Bowel sounds are normal. Exhibits no distension and no mass. There is no tenderness.  Musculoskeletal: Normal range of motion. Exhibits no edema.  Lymphadenopathy:    No cervical adenopathy.  Neurological: Alert and oriented to person, place, and time. Exhibits muscle wasting the patient was examined in the wheelchair. Skin: Skin is warm and dry. No rash noted. Not diaphoretic. No erythema. No pallor.  Psychiatric: Mood, memory and judgment normal.  Vitals reviewed.  LABORATORY DATA: Lab Results  Component Value Date   WBC 4.8 05/13/2020   HGB 9.9 (L) 05/13/2020   HCT 31.3 (L) 05/13/2020   MCV 91.5 05/13/2020   PLT 216 05/13/2020      Chemistry      Component Value Date/Time   NA 142 05/13/2020 0934   K 3.4 (L) 05/13/2020 0934   CL 106 05/13/2020 0934   CO2 24 05/13/2020 0934   BUN 23 05/13/2020 0934   CREATININE 1.65 (H) 05/13/2020 0934   CREATININE 0.90 02/08/2016 0949      Component Value Date/Time   CALCIUM 7.0 (L) 05/13/2020 0934   ALKPHOS 61 05/13/2020 0934   AST 23 05/13/2020 0934   ALT 17 05/13/2020 0934   BILITOT 0.2 (L) 05/13/2020 0934       RADIOGRAPHIC STUDIES:  CT Chest W Contrast  Result Date: 04/19/2020 CLINICAL DATA:  Adenocarcinoma of left lung, stage IV. Non-small-cell. Poorly differentiated with mediastinal adenopathy and bilateral pulmonary nodules. Asymptomatic. Currently on chemotherapy. EXAM: CT CHEST, ABDOMEN, AND PELVIS WITH CONTRAST TECHNIQUE: Multidetector CT imaging of the chest, abdomen and pelvis was performed following the standard protocol during bolus administration of intravenous contrast. CONTRAST:  70mL OMNIPAQUE IOHEXOL 300 MG/ML  SOLN COMPARISON:  01/02/2020 PET.  CTA chest  12/18/2019. FINDINGS: CT CHEST FINDINGS Cardiovascular: Advanced aortic and branch vessel atherosclerosis. Tortuous thoracic aorta. Normal heart size with minimal pericardial fluid. Multivessel coronary artery atherosclerosis. Right Port-A-Cath tip mid right atrium. No central pulmonary embolism, on this non-dedicated study. Mediastinum/Nodes: No supraclavicular adenopathy. No mediastinal or hilar adenopathy. The hypermetabolic AP window node on the prior PET has resolved. Fluid level in the esophagus on 28/2. Lungs/Pleura: No pleural fluid. Advanced bullous emphysema. Response to therapy of pulmonary metastasis. Primarily resolution of previously described pulmonary nodules. A 3 mm nodule on the right minor fissure on 84/6 measured 5 mm on the prior PET (when remeasured). Minimal residual nodularity in the left lower lobe at 5 mm on 104/6. This is at the site of a  1.4 cm nodule on the prior PET (when remeasured). The peripheral left upper lobe primary is significantly decreased. Residual soft tissue thickening and volume loss for example at 3.7 x 3.3 cm on 44/6, at the site of a 6.3 x 6.1 cm mass on the prior PET (when remeasured). Musculoskeletal: Osteopenia. CT ABDOMEN PELVIS FINDINGS Hepatobiliary: Normal liver. Normal gallbladder, without biliary ductal dilatation. Pancreas: Normal, without mass or ductal dilatation. Spleen: Normal in size, without focal abnormality. Adrenals/Urinary Tract: Normal adrenal glands. Renal cortical scarring bilaterally. Lower pole right and probable lower pole left punctate renal collecting system calculi. No hydronephrosis. Stomach/Bowel: Proximal gastric underdistention. Normal colon and terminal ileum. Normal small bowel. Vascular/Lymphatic: Advanced aortic and branch vessel atherosclerosis. Status post aortic bypass. No abdominopelvic adenopathy. Reproductive: Hysterectomy.  No adnexal mass. Other: No significant free fluid. Mild pelvic floor laxity. No evidence of omental or  peritoneal disease. Musculoskeletal: Osteopenia. Lumbosacral spondylosis with trace L3-4 anterolisthesis. IMPRESSION: 1. Response to therapy of left upper lobe primary, pulmonary and thoracic nodal metastasis. 2. No acute process or evidence of metastatic disease in the abdomen or pelvis. 3. Bilateral nephrolithiasis. 4. Aortic Atherosclerosis (ICD10-I70.0) and Emphysema (ICD10-J43.9). Coronary artery atherosclerosis. Electronically Signed   By: Abigail Miyamoto M.D.   On: 04/19/2020 16:37   CT Abdomen Pelvis W Contrast  Result Date: 04/19/2020 CLINICAL DATA:  Adenocarcinoma of left lung, stage IV. Non-small-cell. Poorly differentiated with mediastinal adenopathy and bilateral pulmonary nodules. Asymptomatic. Currently on chemotherapy. EXAM: CT CHEST, ABDOMEN, AND PELVIS WITH CONTRAST TECHNIQUE: Multidetector CT imaging of the chest, abdomen and pelvis was performed following the standard protocol during bolus administration of intravenous contrast. CONTRAST:  35mL OMNIPAQUE IOHEXOL 300 MG/ML  SOLN COMPARISON:  01/02/2020 PET.  CTA chest 12/18/2019. FINDINGS: CT CHEST FINDINGS Cardiovascular: Advanced aortic and branch vessel atherosclerosis. Tortuous thoracic aorta. Normal heart size with minimal pericardial fluid. Multivessel coronary artery atherosclerosis. Right Port-A-Cath tip mid right atrium. No central pulmonary embolism, on this non-dedicated study. Mediastinum/Nodes: No supraclavicular adenopathy. No mediastinal or hilar adenopathy. The hypermetabolic AP window node on the prior PET has resolved. Fluid level in the esophagus on 28/2. Lungs/Pleura: No pleural fluid. Advanced bullous emphysema. Response to therapy of pulmonary metastasis. Primarily resolution of previously described pulmonary nodules. A 3 mm nodule on the right minor fissure on 84/6 measured 5 mm on the prior PET (when remeasured). Minimal residual nodularity in the left lower lobe at 5 mm on 104/6. This is at the site of a 1.4 cm nodule on  the prior PET (when remeasured). The peripheral left upper lobe primary is significantly decreased. Residual soft tissue thickening and volume loss for example at 3.7 x 3.3 cm on 44/6, at the site of a 6.3 x 6.1 cm mass on the prior PET (when remeasured). Musculoskeletal: Osteopenia. CT ABDOMEN PELVIS FINDINGS Hepatobiliary: Normal liver. Normal gallbladder, without biliary ductal dilatation. Pancreas: Normal, without mass or ductal dilatation. Spleen: Normal in size, without focal abnormality. Adrenals/Urinary Tract: Normal adrenal glands. Renal cortical scarring bilaterally. Lower pole right and probable lower pole left punctate renal collecting system calculi. No hydronephrosis. Stomach/Bowel: Proximal gastric underdistention. Normal colon and terminal ileum. Normal small bowel. Vascular/Lymphatic: Advanced aortic and branch vessel atherosclerosis. Status post aortic bypass. No abdominopelvic adenopathy. Reproductive: Hysterectomy.  No adnexal mass. Other: No significant free fluid. Mild pelvic floor laxity. No evidence of omental or peritoneal disease. Musculoskeletal: Osteopenia. Lumbosacral spondylosis with trace L3-4 anterolisthesis. IMPRESSION: 1. Response to therapy of left upper lobe primary, pulmonary and thoracic nodal metastasis. 2. No  acute process or evidence of metastatic disease in the abdomen or pelvis. 3. Bilateral nephrolithiasis. 4. Aortic Atherosclerosis (ICD10-I70.0) and Emphysema (ICD10-J43.9). Coronary artery atherosclerosis. Electronically Signed   By: Abigail Miyamoto M.D.   On: 04/19/2020 16:37     ASSESSMENT/PLAN:  This is a very pleasant 74 year old Caucasian female with a stage IV (T3, N2, M1a) non-small cell lung cancer, poorly differentiated adenocarcinoma diagnosed in June 2021 and presented with large left upper lobe lung mass with mediastinal invasion in addition to mediastinal lymphadenopathy as well as bilateral pulmonary nodules. Her PDL1 expression is 100% and she has no  actionable mutations.   She completed palliative radiotherapy the lung due to her bulky disease.   She is currently undergoing palliative systemic chemotherapy with carboplatin for an AUC of 5, Alimta 400 mg per metered squared, Keytruda 200 mg IV every 3 weeks.  She is status post 4 cycles. Starting from today, cycle #5, the patient will be on maintenance Alimta and Keytruda.   Labs were reviewed. Reviewed her GFR and creatinine with Dr. Julien Nordmann who is ok to proceed with cycle #5 today as scheduled. She is already on a reduced dose of Alimta. She will receive about ~500 cc's of IVF today.   We will see the patient back for follow-up appointment in 3 weeks for evaluation before starting cycle #6.  The patient's pulse was initially 123 today. An EKG was performed which showed sinus tachycardiac with a pulse of 101. She is ok to treat today.   The patient was advised to call immediately if she has any concerning symptoms in the interval. The patient voices understanding of current disease status and treatment options and is in agreement with the current care plan. All questions were answered. The patient knows to call the clinic with any problems, questions or concerns. We can certainly see the patient much sooner if necessary      Orders Placed This Encounter  Procedures  . CBC with Differential (Cancer Center Only)    Standing Status:   Standing    Number of Occurrences:   18    Standing Expiration Date:   05/13/2021  . CMP ([facility] only)    Standing Status:   Standing    Number of Occurrences:   18    Standing Expiration Date:   05/13/2021  . EKG 12-Lead  . EKG 12-Lead    Ordered by an unspecified provider      Edie Darley L Tytianna Greenley, PA-C 05/13/20

## 2020-05-12 DIAGNOSIS — I739 Peripheral vascular disease, unspecified: Secondary | ICD-10-CM | POA: Diagnosis not present

## 2020-05-12 DIAGNOSIS — J441 Chronic obstructive pulmonary disease with (acute) exacerbation: Secondary | ICD-10-CM | POA: Diagnosis not present

## 2020-05-12 DIAGNOSIS — Z0001 Encounter for general adult medical examination with abnormal findings: Secondary | ICD-10-CM | POA: Diagnosis not present

## 2020-05-12 DIAGNOSIS — K219 Gastro-esophageal reflux disease without esophagitis: Secondary | ICD-10-CM | POA: Diagnosis not present

## 2020-05-12 DIAGNOSIS — N1831 Chronic kidney disease, stage 3a: Secondary | ICD-10-CM | POA: Diagnosis not present

## 2020-05-12 DIAGNOSIS — E782 Mixed hyperlipidemia: Secondary | ICD-10-CM | POA: Diagnosis not present

## 2020-05-13 ENCOUNTER — Other Ambulatory Visit: Payer: Self-pay

## 2020-05-13 ENCOUNTER — Ambulatory Visit: Payer: Medicare Other | Admitting: Physician Assistant

## 2020-05-13 ENCOUNTER — Inpatient Hospital Stay: Payer: Medicare Other

## 2020-05-13 ENCOUNTER — Other Ambulatory Visit: Payer: Medicare Other

## 2020-05-13 ENCOUNTER — Inpatient Hospital Stay: Payer: Medicare Other | Admitting: Physician Assistant

## 2020-05-13 ENCOUNTER — Ambulatory Visit: Payer: Medicare Other

## 2020-05-13 ENCOUNTER — Encounter: Payer: Self-pay | Admitting: Physician Assistant

## 2020-05-13 VITALS — HR 96

## 2020-05-13 VITALS — BP 114/60 | HR 123 | Temp 97.0°F | Resp 18 | Ht 68.0 in | Wt 145.2 lb

## 2020-05-13 DIAGNOSIS — C3492 Malignant neoplasm of unspecified part of left bronchus or lung: Secondary | ICD-10-CM

## 2020-05-13 DIAGNOSIS — D6481 Anemia due to antineoplastic chemotherapy: Secondary | ICD-10-CM | POA: Diagnosis not present

## 2020-05-13 DIAGNOSIS — R000 Tachycardia, unspecified: Secondary | ICD-10-CM

## 2020-05-13 DIAGNOSIS — Z5111 Encounter for antineoplastic chemotherapy: Secondary | ICD-10-CM | POA: Diagnosis not present

## 2020-05-13 DIAGNOSIS — Z23 Encounter for immunization: Secondary | ICD-10-CM | POA: Diagnosis not present

## 2020-05-13 DIAGNOSIS — Z5112 Encounter for antineoplastic immunotherapy: Secondary | ICD-10-CM | POA: Diagnosis not present

## 2020-05-13 DIAGNOSIS — Z79899 Other long term (current) drug therapy: Secondary | ICD-10-CM | POA: Diagnosis not present

## 2020-05-13 DIAGNOSIS — C3412 Malignant neoplasm of upper lobe, left bronchus or lung: Secondary | ICD-10-CM | POA: Diagnosis not present

## 2020-05-13 LAB — CBC WITH DIFFERENTIAL (CANCER CENTER ONLY)
Abs Immature Granulocytes: 0.15 10*3/uL — ABNORMAL HIGH (ref 0.00–0.07)
Basophils Absolute: 0 10*3/uL (ref 0.0–0.1)
Basophils Relative: 1 %
Eosinophils Absolute: 0.1 10*3/uL (ref 0.0–0.5)
Eosinophils Relative: 2 %
HCT: 31.3 % — ABNORMAL LOW (ref 36.0–46.0)
Hemoglobin: 9.9 g/dL — ABNORMAL LOW (ref 12.0–15.0)
Immature Granulocytes: 3 %
Lymphocytes Relative: 19 %
Lymphs Abs: 0.9 10*3/uL (ref 0.7–4.0)
MCH: 28.9 pg (ref 26.0–34.0)
MCHC: 31.6 g/dL (ref 30.0–36.0)
MCV: 91.5 fL (ref 80.0–100.0)
Monocytes Absolute: 1.1 10*3/uL — ABNORMAL HIGH (ref 0.1–1.0)
Monocytes Relative: 23 %
Neutro Abs: 2.5 10*3/uL (ref 1.7–7.7)
Neutrophils Relative %: 52 %
Platelet Count: 216 10*3/uL (ref 150–400)
RBC: 3.42 MIL/uL — ABNORMAL LOW (ref 3.87–5.11)
RDW: 19.5 % — ABNORMAL HIGH (ref 11.5–15.5)
WBC Count: 4.8 10*3/uL (ref 4.0–10.5)
nRBC: 0 % (ref 0.0–0.2)

## 2020-05-13 LAB — CMP (CANCER CENTER ONLY)
ALT: 17 U/L (ref 0–44)
AST: 23 U/L (ref 15–41)
Albumin: 3.5 g/dL (ref 3.5–5.0)
Alkaline Phosphatase: 61 U/L (ref 38–126)
Anion gap: 12 (ref 5–15)
BUN: 23 mg/dL (ref 8–23)
CO2: 24 mmol/L (ref 22–32)
Calcium: 7 mg/dL — ABNORMAL LOW (ref 8.9–10.3)
Chloride: 106 mmol/L (ref 98–111)
Creatinine: 1.65 mg/dL — ABNORMAL HIGH (ref 0.44–1.00)
GFR, Estimated: 33 mL/min — ABNORMAL LOW (ref >60)
Glucose, Bld: 110 mg/dL — ABNORMAL HIGH (ref 70–99)
Potassium: 3.4 mmol/L — ABNORMAL LOW (ref 3.5–5.1)
Sodium: 142 mmol/L (ref 135–145)
Total Bilirubin: 0.2 mg/dL — ABNORMAL LOW (ref 0.3–1.2)
Total Protein: 7 g/dL (ref 6.5–8.1)

## 2020-05-13 LAB — TSH: TSH: 0.819 u[IU]/mL (ref 0.308–3.960)

## 2020-05-13 MED ORDER — CYANOCOBALAMIN 1000 MCG/ML IJ SOLN
INTRAMUSCULAR | Status: AC
  Filled 2020-05-13: qty 1

## 2020-05-13 MED ORDER — FOLIC ACID 1 MG PO TABS: 1.0000 mg | ORAL_TABLET | Freq: Every day | ORAL | 4 refills | Status: DC

## 2020-05-13 MED ORDER — SODIUM CHLORIDE 0.9 % IV SOLN
200.0000 mg | Freq: Once | INTRAVENOUS | Status: AC
  Administered 2020-05-13: 200 mg via INTRAVENOUS
  Filled 2020-05-13: qty 8

## 2020-05-13 MED ORDER — PROCHLORPERAZINE MALEATE 10 MG PO TABS
10.0000 mg | ORAL_TABLET | Freq: Once | ORAL | Status: AC
  Administered 2020-05-13: 10 mg via ORAL

## 2020-05-13 MED ORDER — SODIUM CHLORIDE 0.9 % IV SOLN
Freq: Once | INTRAVENOUS | Status: AC
  Filled 2020-05-13: qty 250

## 2020-05-13 MED ORDER — SODIUM CHLORIDE 0.9 % IV SOLN
400.0000 mg/m2 | Freq: Once | INTRAVENOUS | Status: AC
  Administered 2020-05-13: 700 mg via INTRAVENOUS
  Filled 2020-05-13: qty 20

## 2020-05-13 MED ORDER — PROCHLORPERAZINE MALEATE 10 MG PO TABS
ORAL_TABLET | ORAL | Status: AC
  Filled 2020-05-13: qty 1

## 2020-05-13 MED ORDER — HEPARIN SOD (PORK) LOCK FLUSH 100 UNIT/ML IV SOLN
500.0000 [IU] | Freq: Once | INTRAVENOUS | Status: AC | PRN
  Administered 2020-05-13: 500 [IU]
  Filled 2020-05-13: qty 5

## 2020-05-13 MED ORDER — SODIUM CHLORIDE 0.9% FLUSH
10.0000 mL | INTRAVENOUS | Status: DC | PRN
  Administered 2020-05-13: 10 mL
  Filled 2020-05-13: qty 10

## 2020-05-13 NOTE — Progress Notes (Signed)
Per Cassie Ok to treat with today's labs

## 2020-05-13 NOTE — Patient Instructions (Signed)
[facility] Discharge Instructions for Patients Receiving Chemotherapy  Today you received the following chemotherapy agents: Keytruda, Alimta  To help prevent nausea and vomiting after your treatment, we encourage you to take your nausea medication  as prescribed.    If you develop nausea and vomiting that is not controlled by your nausea medication, call the clinic.   BELOW ARE SYMPTOMS THAT SHOULD BE REPORTED IMMEDIATELY:  *FEVER GREATER THAN 100.5 F  *CHILLS WITH OR WITHOUT FEVER  NAUSEA AND VOMITING THAT IS NOT CONTROLLED WITH YOUR NAUSEA MEDICATION  *UNUSUAL SHORTNESS OF BREATH  *UNUSUAL BRUISING OR BLEEDING  TENDERNESS IN MOUTH AND THROAT WITH OR WITHOUT PRESENCE OF ULCERS  *URINARY PROBLEMS  *BOWEL PROBLEMS  UNUSUAL RASH Items with * indicate a potential emergency and should be followed up as soon as possible.  Feel free to call the clinic should you have any questions or concerns. The clinic phone number is [phone].  Please show the [place] at check-in to the Emergency Department and triage nurse.

## 2020-05-16 DIAGNOSIS — J449 Chronic obstructive pulmonary disease, unspecified: Secondary | ICD-10-CM | POA: Diagnosis not present

## 2020-05-20 DIAGNOSIS — H61002 Unspecified perichondritis of left external ear: Secondary | ICD-10-CM | POA: Diagnosis not present

## 2020-05-20 DIAGNOSIS — D692 Other nonthrombocytopenic purpura: Secondary | ICD-10-CM | POA: Diagnosis not present

## 2020-06-01 NOTE — Progress Notes (Signed)
[facility] OFFICE PROGRESS NOTE  Celene Squibb, MD [address] [place] [address]  DIAGNOSIS: Stage IV (T3, N2,M1a)non-small cell lung cancer, poorly differentiated adenocarcinoma presented with large left upper lobe lung mass in addition to mediastinal lymphadenopathy and bilateral pulmonary nodules diagnosed in June 2021.  Biomarker Findings Tumor Mutational Burden - 10 Muts/Mb Microsatellite status - MS-Stable Genomic Findings For a complete list of the genes assayed, please refer to the Appendix. NF1 Q2582* KRAS G12V CTNNB1 A659f*10 TP53 H179N 7 Disease relevant genes with no reportable alterations: ALK, BRAF, EGFR, ERBB2, MET, RET, ROS1  PDL1 Expression: 100%  PRIOR THERAPY: Palliative radiotherapy to the large left upper lobe lung mass.  CURRENT THERAPY: Systemic chemotherapy with carboplatin for AUC of 5, Alimta 400 mg/M2 and Keytruda 200 mg IV every 3 weeks. First dose 02/12/2020. Status post5cycles. Starting from cycle #5, she will be on maintenance alimta and [place] She is on reduced dose of Alimta due to her renal insufficiency.   INTERVAL HISTORY: RCHIQUITTA MATTY89y.[id number]. female returns to the clinic today for a follow-up visit accompanied by her husband. The patient is feeling fairly well today without any concerning complaints except for fatigue. She has been tolerating her chemotherapy well without any concerning adverse side effects except for fatigue.  She recently had a follow-up appointment with her pulmonologist regarding her COPD. She also saw her PCP for a lesion on her left ear. She was referred to dermatology for this for consideration of a biopsy. The patient also had a 1 month follow-up visit with Dr. KSondra Come from radiation oncology. She has some radiation dermatitis. Today, the patient denies any fever, chills, or night sweats.  Her weight is stable. She reports her baseline dyspnea on exertion for which she is currently on 3 L home  oxygen via nasal cannula.  She reports her baseline mild productive cough. She denies any chest pain or palpitations. She denies any nausea, vomiting, diarrhea, or constipation.  She denies any headache or visual changes. The patient is here today for evaluation before starting cycle #6.  MEDICAL HISTORY: Past Medical History:  Diagnosis Date  . Arthritis   . COPD (chronic obstructive pulmonary disease) ([place]   . Depression   . E. coli pyelonephritis 6//2014  . GERD (gastroesophageal reflux disease)   . Pneumonia   . Pulmonary hypertension ([place]   . Sepsis ([place]     ALLERGIES:  is allergic to sulfur.  MEDICATIONS:  Current Outpatient Medications  Medication Sig Dispense Refill  . acetaminophen (TYLENOL) 500 MG tablet Take 500 mg by mouth every 6 (six) hours as needed for mild pain.    .Marland Kitchenalbuterol (PROVENTIL) (2.5 MG/3ML) 0.083% nebulizer solution Take 3 mLs (2.5 mg total) by nebulization every 6 (six) hours as needed for wheezing or shortness of breath. 75 mL 12  . aspirin EC 81 MG tablet Take 1 tablet (81 mg total) by mouth daily.    .Marland Kitchenatorvastatin (LIPITOR) 40 MG tablet Take 40 mg by mouth daily.    . cephALEXin (KEFLEX) 500 MG capsule Take 500 mg by mouth 2 (two) times daily.    . folic acid (FOLVITE) 1 MG tablet Take 1 tablet (1 mg total) by mouth daily. 30 tablet 4  . levocetirizine (XYZAL) 5 MG tablet Take 5 mg by mouth every evening.     . lidocaine-prilocaine (EMLA) cream Apply to the Port-A-Cath site 30-60-minute before chemotherapy every 3 weeks. 30 g 0  . Multiple Vitamin (  MULTIVITAMIN WITH MINERALS) TABS tablet Take 1 tablet by mouth daily.    . mupirocin ointment (BACTROBAN) 2 % SMARTSIG:1 Application Topical 2-3 Times Daily    . omeprazole (PRILOSEC) 20 MG capsule Take 20 mg by mouth daily.     . prochlorperazine (COMPAZINE) 10 MG tablet Take 1 tablet (10 mg total) by mouth every 6 (six) hours as needed for nausea or vomiting. 30 tablet 0  . sertraline (ZOLOFT) 100 MG  tablet Take 100 mg by mouth daily.     . TRELEGY ELLIPTA 100-62.5-25 MCG/INH AEPB Inhale 1 puff into the lungs daily.     No current facility-administered medications for this visit.   Facility-Administered Medications Ordered in Other Visits  Medication Dose Route Frequency Provider Last Rate Last Admin  . heparin lock flush 100 unit/mL  500 Units Intracatheter Once PRN Curt Bears, MD      . pembrolizumab [facility]) 200 mg in sodium chloride 0.9 % 50 mL chemo infusion  200 mg Intravenous Once Curt Bears, MD      . sodium chloride flush (NS) 0.9 % injection 10 mL  10 mL Intracatheter PRN Curt Bears, MD        SURGICAL HISTORY:  Past Surgical History:  Procedure Laterality Date  . ABDOMINAL HYSTERECTOMY     partial  . AORTA - BILATERAL FEMORAL ARTERY BYPASS GRAFT Bilateral 06/14/2016   Procedure: AORTOBIFEMORAL BYPASS GRAFT AORTA SMA BYPASS GRAFT RE-IMPLANTATION  OF SUPERIOR MESENTERIC ARTERY;  Surgeon: Serafina Mitchell, MD;  Location: [facility];  Service: Vascular;  Laterality: Bilateral;  . BACK SURGERY     L4/5  . CARDIAC CATHETERIZATION N/A 01/03/2016   Procedure: Right/Left Heart Cath and Coronary Angiography;  Surgeon: Jolaine Artist, MD;  Location: [facility];  Service: Cardiovascular;  Laterality: N/A;  . COLONOSCOPY N/A 08/05/2015   Procedure: COLONOSCOPY;  Surgeon: Rogene Houston, MD;  [facility];  Service: Endoscopy;  Laterality: N/A;  730  . ELBOW SURGERY    . IR IMAGING GUIDED PORT INSERTION  02/16/2020  . PERIPHERAL VASCULAR CATHETERIZATION N/A 02/16/2016   Procedure: Abdominal Aortogram w/Lower Extremity;  Surgeon: Wellington Hampshire, MD;  Location: [facility];  Service: Cardiovascular;  Laterality: N/A;    REVIEW OF SYSTEMS:   Review of Systems  Constitutional: Positive for fatigue.  Negative for appetite change, chills, fever and unexpected weight change.  HENT:   Negative for mouth sores, nosebleeds, sore throat and trouble  swallowing.   Eyes: Negative for eye problems and icterus.  Respiratory:Positive for mild dyspnea on exertion and a mild cough.Negative for hemoptysis and wheezing.  Cardiovascular: Negative for chest pain and leg swelling.  Gastrointestinal: Negative for abdominal pain, constipation, diarrhea, nausea and vomiting.  Genitourinary: Negative for bladder incontinence, difficulty urinating, dysuria, frequency and hematuria.  Musculoskeletal: Negative for back pain, gait problem, neck pain and neck stiffness.  Skin: Negative for itching and rash. Skin lesion on left ear.  Neurological:Negative for dizziness, headaches, extremity weakness, gait problem, light-headedness and seizures.  Hematological: Negative for adenopathy. Does not bruise/bleed easily.  Psychiatric/Behavioral: Negative for confusion, depression and sleep disturbance. The patient is not nervous/anxious.    PHYSICAL EXAMINATION:  Blood pressure (!) 83/67, pulse (!) 115, temperature 98 F (36.7 C), resp. rate 12, weight 142 lb 12.8 oz (64.8 kg), SpO2 99 %.  ECOG PERFORMANCE STATUS: 2 - Symptomatic, <50% confined to bed  Physical Exam  Constitutional: Oriented to person, place, and time andelderly appearing femaleand in no  distress.  HENT:  Head: Normocephalic and atraumatic.  Mouth/Throat: Oropharynx is clear and moist. No oropharyngeal exudate.  Eyes: Conjunctivae are normal. Right eye exhibits no discharge. Left eye exhibits no discharge. No scleral icterus.  Neck: Normal range of motion. Neck supple.  Cardiovascular: Tachycardic, regular rhythm, normal heart sounds and intact distal pulses.  Pulmonary/Chest: Effort normal. Quiet breath sounds in all lung fields with some rhonchi bilatearlly. No respiratory distress. On 3 L via nasal cannula.  Abdominal: Soft. Bowel sounds are normal. Exhibits no distension and no mass. There is no tenderness.  Musculoskeletal: Normal range of motion. Exhibits no edema.   Lymphadenopathy:  No cervical adenopathy.  Neurological: Alert and oriented to person, place, and time. Exhibits muscle wasting the patient was examined in the wheelchair. Skin: Skin is warm and dry. No rash noted. Not diaphoretic. No erythema. No pallor.  Psychiatric: Mood, memory and judgment normal.  Vitals reviewed.  LABORATORY DATA: Lab Results  Component Value Date   WBC 8.3 06/03/2020   HGB 7.6 (L) 06/03/2020   HCT 24.4 (L) 06/03/2020   MCV 98.0 06/03/2020   PLT 199 06/03/2020      Chemistry      Component Value Date/Time   NA 142 06/03/2020 1105   K 3.8 06/03/2020 1105   CL 107 06/03/2020 1105   CO2 25 06/03/2020 1105   BUN 31 (H) 06/03/2020 1105   CREATININE 2.03 (H) 06/03/2020 1105   CREATININE 0.90 02/08/2016 0949      Component Value Date/Time   CALCIUM 8.1 (L) 06/03/2020 1105   ALKPHOS 61 06/03/2020 1105   AST 23 06/03/2020 1105   ALT 17 06/03/2020 1105   BILITOT 0.3 06/03/2020 1105       RADIOGRAPHIC STUDIES:  No results found.   ASSESSMENT/PLAN:  This is a very pleasant 10 year oldCaucasianfemale with a stage IV (T3, N2, M1a) non-small cell lung cancer, poorly differentiated adenocarcinoma diagnosed in June 2021 and presented with large left upper lobe lung mass with mediastinal invasion in addition to mediastinal lymphadenopathy as well as bilateral pulmonary nodules. Her PDL1 expression is 100% and she has no actionable mutations.   She completed palliative radiotherapy the lung due to her bulky disease.  She iscurrently undergoing palliative systemic chemotherapy with carboplatin for an AUC of 5, Alimta 400 mg per metered squared, Keytruda 200 mg IV every 3 weeks. She is status post 5 cycles. Starting from today, cycle #5, the patient will be on maintenance Alimta and Keytruda.  Labs were reviewed. I reviewed her labs with Dr. Julien Nordmann. She will proceed with treatment with single agent Keytruda today due to her elevated creatinine. She is  hypotensive today and anemic with a Hbg of 7.6. We will arrange for her to receive 1 L of fluid via normal saline while in the clinic today. Additionally, she will receive 2 units of blood on 06/05/20.   I will arrange for a restaging CT scan of the chest, abdomen, and pelvis prior to starting her next cycle of treatment. I will order the scan without contrast due to her elevated creatinine.  We will see the patient back for follow-up appointment in 3 weeks for evaluation and to review her scan results before starting cycle #7.  We will make her flush appointments since she has a port-a-cath.   The patient was advised to call immediately if she has any concerning symptoms in the interval. The patient voices understanding of current disease status and treatment options and is in agreement  with the current care plan. All questions were answered. The patient knows to call the clinic with any problems, questions or concerns. We can certainly see the patient much sooner if necessary      Orders Placed This Encounter  Procedures  . CT Chest Wo Contrast    Standing Status:   Future    Standing Expiration Date:   06/03/2021    Order Specific Question:   Preferred imaging location?    Answer:   [facility]  . CT Abdomen Pelvis Wo Contrast    Standing Status:   Future    Standing Expiration Date:   06/03/2021    Order Specific Question:   Preferred imaging location?    Answer:   [facility]    Order Specific Question:   Is Oral Contrast requested for this exam?    Answer:   Yes, Per Radiology protocol  . Informed Consent Details: Physician/Practitioner Attestation; Transcribe to consent form and obtain patient signature    Standing Status:   Future    Standing Expiration Date:   06/03/2021    Order Specific Question:   Physician/Practitioner attestation of informed consent for blood and or blood product transfusion    Answer:   I, the physician/practitioner, attest that I  have discussed with the patient the benefits, risks, side effects, alternatives, likelihood of achieving goals and potential problems during recovery for the procedure that I have provided informed consent.    Order Specific Question:   Product(s)    Answer:   All Product(s)  . Care order/instruction    Transfuse Parameters    Standing Status:   Future    Standing Expiration Date:   06/03/2021  . Type and screen    Standing Status:   Future    Number of Occurrences:   1    Standing Expiration Date:   06/03/2021  . Prepare RBC (crossmatch)    Standing Status:   Standing    Number of Occurrences:   1    Order Specific Question:   # of Units    Answer:   2 units    Order Specific Question:   Transfusion Indications    Answer:   Symptomatic Anemia    Order Specific Question:   Number of Units to Keep Ahead    Answer:   NO units ahead    Order Specific Question:   If emergent release call blood bank    Answer:   Not emergent release     Cordelro Gautreau L Zarria Towell, PA-C 06/03/20

## 2020-06-03 ENCOUNTER — Other Ambulatory Visit: Payer: Self-pay

## 2020-06-03 ENCOUNTER — Inpatient Hospital Stay: Payer: Medicare Other

## 2020-06-03 ENCOUNTER — Inpatient Hospital Stay: Payer: Medicare Other | Attending: Internal Medicine

## 2020-06-03 ENCOUNTER — Inpatient Hospital Stay: Payer: Medicare Other | Admitting: Physician Assistant

## 2020-06-03 VITALS — BP 83/67 | HR 115 | Temp 98.0°F | Resp 12 | Wt 142.8 lb

## 2020-06-03 VITALS — BP 128/62 | HR 98 | Resp 16

## 2020-06-03 DIAGNOSIS — D6481 Anemia due to antineoplastic chemotherapy: Secondary | ICD-10-CM | POA: Diagnosis not present

## 2020-06-03 DIAGNOSIS — Z5112 Encounter for antineoplastic immunotherapy: Secondary | ICD-10-CM

## 2020-06-03 DIAGNOSIS — I959 Hypotension, unspecified: Secondary | ICD-10-CM | POA: Diagnosis not present

## 2020-06-03 DIAGNOSIS — C3412 Malignant neoplasm of upper lobe, left bronchus or lung: Secondary | ICD-10-CM | POA: Insufficient documentation

## 2020-06-03 DIAGNOSIS — R7989 Other specified abnormal findings of blood chemistry: Secondary | ICD-10-CM

## 2020-06-03 DIAGNOSIS — C3492 Malignant neoplasm of unspecified part of left bronchus or lung: Secondary | ICD-10-CM

## 2020-06-03 DIAGNOSIS — Z79899 Other long term (current) drug therapy: Secondary | ICD-10-CM | POA: Diagnosis not present

## 2020-06-03 DIAGNOSIS — T451X5A Adverse effect of antineoplastic and immunosuppressive drugs, initial encounter: Secondary | ICD-10-CM

## 2020-06-03 LAB — CBC WITH DIFFERENTIAL (CANCER CENTER ONLY)
Abs Immature Granulocytes: 0.27 10*3/uL — ABNORMAL HIGH (ref 0.00–0.07)
Basophils Absolute: 0 10*3/uL (ref 0.0–0.1)
Basophils Relative: 0 %
Eosinophils Absolute: 0.1 10*3/uL (ref 0.0–0.5)
Eosinophils Relative: 2 %
HCT: 24.4 % — ABNORMAL LOW (ref 36.0–46.0)
Hemoglobin: 7.6 g/dL — ABNORMAL LOW (ref 12.0–15.0)
Immature Granulocytes: 3 %
Lymphocytes Relative: 9 %
Lymphs Abs: 0.7 10*3/uL (ref 0.7–4.0)
MCH: 30.5 pg (ref 26.0–34.0)
MCHC: 31.1 g/dL (ref 30.0–36.0)
MCV: 98 fL (ref 80.0–100.0)
Monocytes Absolute: 1.6 10*3/uL — ABNORMAL HIGH (ref 0.1–1.0)
Monocytes Relative: 19 %
Neutro Abs: 5.6 10*3/uL (ref 1.7–7.7)
Neutrophils Relative %: 67 %
Platelet Count: 199 10*3/uL (ref 150–400)
RBC: 2.49 MIL/uL — ABNORMAL LOW (ref 3.87–5.11)
RDW: 22 % — ABNORMAL HIGH (ref 11.5–15.5)
WBC Count: 8.3 10*3/uL (ref 4.0–10.5)
nRBC: 0 % (ref 0.0–0.2)

## 2020-06-03 LAB — CMP (CANCER CENTER ONLY)
ALT: 17 U/L (ref 0–44)
AST: 23 U/L (ref 15–41)
Albumin: 3.2 g/dL — ABNORMAL LOW (ref 3.5–5.0)
Alkaline Phosphatase: 61 U/L (ref 38–126)
Anion gap: 10 (ref 5–15)
BUN: 31 mg/dL — ABNORMAL HIGH (ref 8–23)
CO2: 25 mmol/L (ref 22–32)
Calcium: 8.1 mg/dL — ABNORMAL LOW (ref 8.9–10.3)
Chloride: 107 mmol/L (ref 98–111)
Creatinine: 2.03 mg/dL — ABNORMAL HIGH (ref 0.44–1.00)
GFR, Estimated: 25 mL/min — ABNORMAL LOW (ref >60)
Glucose, Bld: 151 mg/dL — ABNORMAL HIGH (ref 70–99)
Potassium: 3.8 mmol/L (ref 3.5–5.1)
Sodium: 142 mmol/L (ref 135–145)
Total Bilirubin: 0.3 mg/dL (ref 0.3–1.2)
Total Protein: 6.7 g/dL (ref 6.5–8.1)

## 2020-06-03 LAB — TSH: TSH: 1.048 u[IU]/mL (ref 0.308–3.960)

## 2020-06-03 LAB — PREPARE RBC (CROSSMATCH)

## 2020-06-03 MED ORDER — HEPARIN SOD (PORK) LOCK FLUSH 100 UNIT/ML IV SOLN
500.0000 [IU] | Freq: Once | INTRAVENOUS | Status: AC | PRN
  Administered 2020-06-03: 500 [IU]
  Filled 2020-06-03: qty 5

## 2020-06-03 MED ORDER — PROCHLORPERAZINE MALEATE 10 MG PO TABS
ORAL_TABLET | ORAL | Status: AC
  Filled 2020-06-03: qty 1

## 2020-06-03 MED ORDER — PROCHLORPERAZINE MALEATE 10 MG PO TABS
10.0000 mg | ORAL_TABLET | Freq: Once | ORAL | Status: AC
  Administered 2020-06-03: 10 mg via ORAL

## 2020-06-03 MED ORDER — SODIUM CHLORIDE 0.9 % IV SOLN
200.0000 mg | Freq: Once | INTRAVENOUS | Status: AC
  Administered 2020-06-03: 200 mg via INTRAVENOUS
  Filled 2020-06-03: qty 8

## 2020-06-03 MED ORDER — SODIUM CHLORIDE 0.9% FLUSH
10.0000 mL | INTRAVENOUS | Status: DC | PRN
  Administered 2020-06-03: 10 mL
  Filled 2020-06-03: qty 10

## 2020-06-03 MED ORDER — SODIUM CHLORIDE 0.9 % IV SOLN
Freq: Once | INTRAVENOUS | Status: AC
  Filled 2020-06-03: qty 250

## 2020-06-03 NOTE — Progress Notes (Signed)
Ok to treat with elevated HR,  Hgb  7.6  And SCr 2.03 per Cassie H, PA.  Will hold alimta.  Keytruda and IVF today only.  Blood transfusion Saturday.

## 2020-06-03 NOTE — Patient Instructions (Signed)
[facility] Discharge Instructions for Patients Receiving Chemotherapy  Today you received the following chemotherapy agents: Keytruda  To help prevent nausea and vomiting after your treatment, we encourage you to take your nausea medication  as prescribed.   If you develop nausea and vomiting that is not controlled by your nausea medication, call the clinic.   BELOW ARE SYMPTOMS THAT SHOULD BE REPORTED IMMEDIATELY:  *FEVER GREATER THAN 100.5 F  *CHILLS WITH OR WITHOUT FEVER  NAUSEA AND VOMITING THAT IS NOT CONTROLLED WITH YOUR NAUSEA MEDICATION  *UNUSUAL SHORTNESS OF BREATH  *UNUSUAL BRUISING OR BLEEDING  TENDERNESS IN MOUTH AND THROAT WITH OR WITHOUT PRESENCE OF ULCERS  *URINARY PROBLEMS  *BOWEL PROBLEMS  UNUSUAL RASH Items with * indicate a potential emergency and should be followed up as soon as possible.  Feel free to call the clinic should you have any questions or concerns. The clinic phone number is [phone].  Please show the [facility] at check-in to the Emergency Department and triage nurse.  ALIMTA HELD TODAY.  EXTRA IV FLUIDS GIVEN.  PLEASE COME BACK Saturday FOR BLOOD TRANSFUSION.

## 2020-06-03 NOTE — Patient Instructions (Signed)

## 2020-06-04 ENCOUNTER — Telehealth: Payer: Self-pay | Admitting: Physician Assistant

## 2020-06-04 NOTE — Telephone Encounter (Signed)
Pt requested appts to be scheduled after 10am per 11/18 los. Unable to reach pt. Left voicemail- unable to move appts to a later time.

## 2020-06-05 ENCOUNTER — Other Ambulatory Visit: Payer: Self-pay

## 2020-06-05 ENCOUNTER — Inpatient Hospital Stay: Payer: Medicare Other

## 2020-06-05 DIAGNOSIS — D6481 Anemia due to antineoplastic chemotherapy: Secondary | ICD-10-CM | POA: Diagnosis not present

## 2020-06-05 DIAGNOSIS — T451X5A Adverse effect of antineoplastic and immunosuppressive drugs, initial encounter: Secondary | ICD-10-CM

## 2020-06-05 DIAGNOSIS — Z79899 Other long term (current) drug therapy: Secondary | ICD-10-CM | POA: Diagnosis not present

## 2020-06-05 DIAGNOSIS — C3492 Malignant neoplasm of unspecified part of left bronchus or lung: Secondary | ICD-10-CM

## 2020-06-05 DIAGNOSIS — Z5112 Encounter for antineoplastic immunotherapy: Secondary | ICD-10-CM | POA: Diagnosis not present

## 2020-06-05 DIAGNOSIS — C3412 Malignant neoplasm of upper lobe, left bronchus or lung: Secondary | ICD-10-CM | POA: Diagnosis not present

## 2020-06-05 MED ORDER — SODIUM CHLORIDE 0.9% FLUSH
10.0000 mL | INTRAVENOUS | Status: AC | PRN
  Administered 2020-06-05: 10 mL
  Filled 2020-06-05: qty 10

## 2020-06-05 MED ORDER — ACETAMINOPHEN 325 MG PO TABS
650.0000 mg | ORAL_TABLET | Freq: Once | ORAL | Status: AC
  Administered 2020-06-05: 650 mg via ORAL

## 2020-06-05 MED ORDER — SODIUM CHLORIDE 0.9% IV SOLUTION
250.0000 mL | Freq: Once | INTRAVENOUS | Status: AC
  Administered 2020-06-05: 250 mL via INTRAVENOUS
  Filled 2020-06-05: qty 250

## 2020-06-05 MED ORDER — HEPARIN SOD (PORK) LOCK FLUSH 100 UNIT/ML IV SOLN
250.0000 [IU] | INTRAVENOUS | Status: DC | PRN
  Filled 2020-06-05: qty 5

## 2020-06-05 MED ORDER — SODIUM CHLORIDE 0.9% FLUSH
3.0000 mL | INTRAVENOUS | Status: DC | PRN
  Filled 2020-06-05: qty 10

## 2020-06-05 MED ORDER — HEPARIN SOD (PORK) LOCK FLUSH 100 UNIT/ML IV SOLN
500.0000 [IU] | Freq: Every day | INTRAVENOUS | Status: AC | PRN
  Administered 2020-06-05: 500 [IU]
  Filled 2020-06-05: qty 5

## 2020-06-05 MED ORDER — DIPHENHYDRAMINE HCL 25 MG PO CAPS
25.0000 mg | ORAL_CAPSULE | Freq: Once | ORAL | Status: AC
  Administered 2020-06-05: 25 mg via ORAL

## 2020-06-05 MED ORDER — ACETAMINOPHEN 325 MG PO TABS
ORAL_TABLET | ORAL | Status: AC
  Filled 2020-06-05: qty 2

## 2020-06-05 MED ORDER — DIPHENHYDRAMINE HCL 25 MG PO CAPS
ORAL_CAPSULE | ORAL | Status: AC
  Filled 2020-06-05: qty 1

## 2020-06-06 LAB — BPAM RBC
Blood Product Expiration Date: 202112182359
Blood Product Expiration Date: 202112182359
ISSUE DATE / TIME: 202111200806
ISSUE DATE / TIME: 202111200806
Unit Type and Rh: 5100
Unit Type and Rh: 5100

## 2020-06-06 LAB — TYPE AND SCREEN
ABO/RH(D): O POS
Antibody Screen: NEGATIVE
Unit division: 0
Unit division: 0

## 2020-06-10 DIAGNOSIS — J849 Interstitial pulmonary disease, unspecified: Secondary | ICD-10-CM | POA: Diagnosis not present

## 2020-06-10 DIAGNOSIS — J432 Centrilobular emphysema: Secondary | ICD-10-CM | POA: Diagnosis not present

## 2020-06-10 DIAGNOSIS — J449 Chronic obstructive pulmonary disease, unspecified: Secondary | ICD-10-CM | POA: Diagnosis not present

## 2020-06-11 ENCOUNTER — Telehealth: Payer: Self-pay | Admitting: Physician Assistant

## 2020-06-11 NOTE — Telephone Encounter (Signed)
Moved some appts to later than 10 per los. Called and left msg. Mailed printout

## 2020-06-15 DIAGNOSIS — J449 Chronic obstructive pulmonary disease, unspecified: Secondary | ICD-10-CM | POA: Diagnosis not present

## 2020-06-22 ENCOUNTER — Ambulatory Visit (HOSPITAL_COMMUNITY)
Admission: RE | Admit: 2020-06-22 | Discharge: 2020-06-22 | Disposition: A | Discharge status: Home / Self Care | Payer: Medicare Other | Source: Ambulatory Visit | Attending: Physician Assistant | Admitting: Physician Assistant

## 2020-06-22 ENCOUNTER — Encounter (HOSPITAL_COMMUNITY): Payer: Self-pay

## 2020-06-22 ENCOUNTER — Other Ambulatory Visit: Payer: Self-pay

## 2020-06-22 DIAGNOSIS — C3492 Malignant neoplasm of unspecified part of left bronchus or lung: Secondary | ICD-10-CM | POA: Insufficient documentation

## 2020-06-22 DIAGNOSIS — C3490 Malignant neoplasm of unspecified part of unspecified bronchus or lung: Secondary | ICD-10-CM | POA: Diagnosis not present

## 2020-06-24 ENCOUNTER — Encounter: Payer: Self-pay | Admitting: Internal Medicine

## 2020-06-24 ENCOUNTER — Other Ambulatory Visit: Payer: Self-pay

## 2020-06-24 ENCOUNTER — Ambulatory Visit: Payer: Medicare Other | Admitting: Internal Medicine

## 2020-06-24 ENCOUNTER — Other Ambulatory Visit: Payer: Medicare Other

## 2020-06-24 ENCOUNTER — Inpatient Hospital Stay: Payer: Medicare Other | Attending: Internal Medicine

## 2020-06-24 ENCOUNTER — Inpatient Hospital Stay: Payer: Medicare Other | Admitting: Internal Medicine

## 2020-06-24 ENCOUNTER — Inpatient Hospital Stay: Payer: Medicare Other

## 2020-06-24 ENCOUNTER — Ambulatory Visit: Payer: Medicare Other | Admitting: Physician Assistant

## 2020-06-24 VITALS — HR 100

## 2020-06-24 VITALS — BP 100/67 | HR 112 | Temp 97.3°F | Resp 18 | Ht 68.0 in | Wt 142.3 lb

## 2020-06-24 DIAGNOSIS — Z79899 Other long term (current) drug therapy: Secondary | ICD-10-CM | POA: Insufficient documentation

## 2020-06-24 DIAGNOSIS — D6481 Anemia due to antineoplastic chemotherapy: Secondary | ICD-10-CM | POA: Diagnosis not present

## 2020-06-24 DIAGNOSIS — Z5112 Encounter for antineoplastic immunotherapy: Secondary | ICD-10-CM | POA: Diagnosis not present

## 2020-06-24 DIAGNOSIS — T451X5A Adverse effect of antineoplastic and immunosuppressive drugs, initial encounter: Secondary | ICD-10-CM

## 2020-06-24 DIAGNOSIS — C3492 Malignant neoplasm of unspecified part of left bronchus or lung: Secondary | ICD-10-CM

## 2020-06-24 DIAGNOSIS — C3412 Malignant neoplasm of upper lobe, left bronchus or lung: Secondary | ICD-10-CM | POA: Insufficient documentation

## 2020-06-24 DIAGNOSIS — Z95828 Presence of other vascular implants and grafts: Secondary | ICD-10-CM

## 2020-06-24 LAB — CBC WITH DIFFERENTIAL (CANCER CENTER ONLY)
Abs Immature Granulocytes: 0.05 10*3/uL (ref 0.00–0.07)
Basophils Absolute: 0 10*3/uL (ref 0.0–0.1)
Basophils Relative: 0 %
Eosinophils Absolute: 0.1 10*3/uL (ref 0.0–0.5)
Eosinophils Relative: 1 %
HCT: 34 % — ABNORMAL LOW (ref 36.0–46.0)
Hemoglobin: 11.1 g/dL — ABNORMAL LOW (ref 12.0–15.0)
Immature Granulocytes: 1 %
Lymphocytes Relative: 12 %
Lymphs Abs: 1 10*3/uL (ref 0.7–4.0)
MCH: 31.8 pg (ref 26.0–34.0)
MCHC: 32.6 g/dL (ref 30.0–36.0)
MCV: 97.4 fL (ref 80.0–100.0)
Monocytes Absolute: 1.3 10*3/uL — ABNORMAL HIGH (ref 0.1–1.0)
Monocytes Relative: 16 %
Neutro Abs: 5.7 10*3/uL (ref 1.7–7.7)
Neutrophils Relative %: 70 %
Platelet Count: 166 10*3/uL (ref 150–400)
RBC: 3.49 MIL/uL — ABNORMAL LOW (ref 3.87–5.11)
RDW: 18.3 % — ABNORMAL HIGH (ref 11.5–15.5)
WBC Count: 8.1 10*3/uL (ref 4.0–10.5)
nRBC: 0 % (ref 0.0–0.2)

## 2020-06-24 LAB — CMP (CANCER CENTER ONLY)
ALT: 10 U/L (ref 0–44)
AST: 20 U/L (ref 15–41)
Albumin: 3.5 g/dL (ref 3.5–5.0)
Alkaline Phosphatase: 68 U/L (ref 38–126)
Anion gap: 12 (ref 5–15)
BUN: 38 mg/dL — ABNORMAL HIGH (ref 8–23)
CO2: 25 mmol/L (ref 22–32)
Calcium: 9.3 mg/dL (ref 8.9–10.3)
Chloride: 105 mmol/L (ref 98–111)
Creatinine: 2.21 mg/dL — ABNORMAL HIGH (ref 0.44–1.00)
GFR, Estimated: 23 mL/min — ABNORMAL LOW (ref >60)
Glucose, Bld: 97 mg/dL (ref 70–99)
Potassium: 3.8 mmol/L (ref 3.5–5.1)
Sodium: 142 mmol/L (ref 135–145)
Total Bilirubin: 0.4 mg/dL (ref 0.3–1.2)
Total Protein: 7.2 g/dL (ref 6.5–8.1)

## 2020-06-24 LAB — TSH: TSH: 0.831 u[IU]/mL (ref 0.308–3.960)

## 2020-06-24 MED ORDER — PROCHLORPERAZINE MALEATE 10 MG PO TABS
ORAL_TABLET | ORAL | Status: AC
  Filled 2020-06-24: qty 1

## 2020-06-24 MED ORDER — PROCHLORPERAZINE MALEATE 10 MG PO TABS
10.0000 mg | ORAL_TABLET | Freq: Once | ORAL | Status: AC
  Administered 2020-06-24: 10 mg via ORAL

## 2020-06-24 MED ORDER — CYANOCOBALAMIN 1000 MCG/ML IJ SOLN
1000.0000 ug | Freq: Once | INTRAMUSCULAR | Status: AC
  Administered 2020-06-24: 1000 ug via INTRAMUSCULAR

## 2020-06-24 MED ORDER — SODIUM CHLORIDE 0.9% FLUSH
10.0000 mL | Freq: Once | INTRAVENOUS | Status: AC
  Administered 2020-06-24: 10 mL
  Filled 2020-06-24: qty 10

## 2020-06-24 MED ORDER — CYANOCOBALAMIN 1000 MCG/ML IJ SOLN
INTRAMUSCULAR | Status: AC
  Filled 2020-06-24: qty 1

## 2020-06-24 MED ORDER — SODIUM CHLORIDE 0.9% FLUSH
10.0000 mL | INTRAVENOUS | Status: DC | PRN
  Administered 2020-06-24: 10 mL
  Filled 2020-06-24: qty 10

## 2020-06-24 MED ORDER — SODIUM CHLORIDE 0.9 % IV SOLN
Freq: Once | INTRAVENOUS | Status: AC
  Filled 2020-06-24: qty 250

## 2020-06-24 MED ORDER — SODIUM CHLORIDE 0.9 % IV SOLN
200.0000 mg | Freq: Once | INTRAVENOUS | Status: AC
  Administered 2020-06-24: 200 mg via INTRAVENOUS
  Filled 2020-06-24 (×2): qty 8

## 2020-06-24 NOTE — Patient Instructions (Addendum)
[facility] Discharge Instructions for Patients Receiving Chemotherapy  Today you received the following chemotherapy agent: Keytruda  To help prevent nausea and vomiting after your treatment, we encourage you to take your nausea medication  as prescribed.   If you develop nausea and vomiting that is not controlled by your nausea medication, call the clinic.   BELOW ARE SYMPTOMS THAT SHOULD BE REPORTED IMMEDIATELY:  *FEVER GREATER THAN 100.5 F  *CHILLS WITH OR WITHOUT FEVER  NAUSEA AND VOMITING THAT IS NOT CONTROLLED WITH YOUR NAUSEA MEDICATION  *UNUSUAL SHORTNESS OF BREATH  *UNUSUAL BRUISING OR BLEEDING  TENDERNESS IN MOUTH AND THROAT WITH OR WITHOUT PRESENCE OF ULCERS  *URINARY PROBLEMS  *BOWEL PROBLEMS  UNUSUAL RASH Items with * indicate a potential emergency and should be followed up as soon as possible.  Feel free to call the clinic should you have any questions or concerns. The clinic phone number is [phone].  Please show the [place] at check-in to the Emergency Department and triage nurse.

## 2020-06-24 NOTE — Progress Notes (Signed)
Pt tolerated tx well, w/c with belongings to exit, spouse taking pt home, stable at time of d/c.

## 2020-06-24 NOTE — Patient Instructions (Signed)
Steps to Quit Smoking Smoking tobacco is the leading cause of preventable death. It can affect almost every organ in the body. Smoking puts you and people around you at risk for many serious, long-lasting (chronic) diseases. Quitting smoking can be hard, but it is one of the best things that you can do for your health. It is never too late to quit. How do I get ready to quit? When you decide to quit smoking, make a plan to help you succeed. Before you quit:  Pick a date to quit. Set a date within the next 2 weeks to give you time to prepare.  Write down the reasons why you are quitting. Keep this list in places where you will see it often.  Tell your family, friends, and co-workers that you are quitting. Their support is important.  Talk with your doctor about the choices that may help you quit.  Find out if your health insurance will pay for these treatments.  Know the people, places, things, and activities that make you want to smoke (triggers). Avoid them. What first steps can I take to quit smoking?  Throw away all cigarettes at home, at work, and in your car.  Throw away the things that you use when you smoke, such as ashtrays and lighters.  Clean your car. Make sure to empty the ashtray.  Clean your home, including curtains and carpets. What can I do to help me quit smoking? Talk with your doctor about taking medicines and seeing a counselor at the same time. You are more likely to succeed when you do both.  If you are pregnant or breastfeeding, talk with your doctor about counseling or other ways to quit smoking. Do not take medicine to help you quit smoking unless your doctor tells you to do so. To quit smoking: Quit right away  Quit smoking totally, instead of slowly cutting back on how much you smoke over a period of time.  Go to counseling. You are more likely to quit if you go to counseling sessions regularly. Take medicine You may take medicines to help you quit. Some  medicines need a prescription, and some you can buy over-the-counter. Some medicines may contain a drug called nicotine to replace the nicotine in cigarettes. Medicines may:  Help you to stop having the desire to smoke (cravings).  Help to stop the problems that come when you stop smoking (withdrawal symptoms). Your doctor may ask you to use:  Nicotine patches, gum, or lozenges.  Nicotine inhalers or sprays.  Non-nicotine medicine that is taken by mouth. Find resources Find resources and other ways to help you quit smoking and remain smoke-free after you quit. These resources are most helpful when you use them often. They include:  Online chats with a counselor.  Phone quitlines.  Printed self-help materials.  Support groups or group counseling.  Text messaging programs.  Mobile phone apps. Use apps on your mobile phone or tablet that can help you stick to your quit plan. There are many free apps for mobile phones and tablets as well as websites. Examples include Quit Guide from the CDC and smokefree.gov  What things can I do to make it easier to quit?   Talk to your family and friends. Ask them to support and encourage you.  Call a phone quitline (1-800-QUIT-NOW), reach out to support groups, or work with a counselor.  Ask people who smoke to not smoke around you.  Avoid places that make you want to smoke,   such as: ? Bars. ? Parties. ? Smoke-break areas at work.  Spend time with people who do not smoke.  Lower the stress in your life. Stress can make you want to smoke. Try these things to help your stress: ? Getting regular exercise. ? Doing deep-breathing exercises. ? Doing yoga. ? Meditating. ? Doing a body scan. To do this, close your eyes, focus on one area of your body at a time from head to toe. Notice which parts of your body are tense. Try to relax the muscles in those areas. How will I feel when I quit smoking? Day 1 to 3 weeks Within the first 24 hours,  you may start to have some problems that come from quitting tobacco. These problems are very bad 2-3 days after you quit, but they do not often last for more than 2-3 weeks. You may get these symptoms:  Mood swings.  Feeling restless, nervous, angry, or annoyed.  Trouble concentrating.  Dizziness.  Strong desire for high-sugar foods and nicotine.  Weight gain.  Trouble pooping (constipation).  Feeling like you may vomit (nausea).  Coughing or a sore throat.  Changes in how the medicines that you take for other issues work in your body.  Depression.  Trouble sleeping (insomnia). Week 3 and afterward After the first 2-3 weeks of quitting, you may start to notice more positive results, such as:  Better sense of smell and taste.  Less coughing and sore throat.  Slower heart rate.  Lower blood pressure.  Clearer skin.  Better breathing.  Fewer sick days. Quitting smoking can be hard. Do not give up if you fail the first time. Some people need to try a few times before they succeed. Do your best to stick to your quit plan, and talk with your doctor if you have any questions or concerns. Summary  Smoking tobacco is the leading cause of preventable death. Quitting smoking can be hard, but it is one of the best things that you can do for your health.  When you decide to quit smoking, make a plan to help you succeed.  Quit smoking right away, not slowly over a period of time.  When you start quitting, seek help from your doctor, family, or friends. This information is not intended to replace advice given to you by your health care provider. Make sure you discuss any questions you have with your health care provider. Document Revised: 03/28/2019 Document Reviewed: 09/21/2018 Elsevier Patient Education  2020 [employer]  

## 2020-06-24 NOTE — Progress Notes (Signed)
[name] [phone]   [phone]  OFFICE PROGRESS NOTE  Celene Squibb, MD [address] [place] [address]  DIAGNOSIS: Stage IV (T3, N2, M1a) non-small cell lung cancer, poorly differentiated adenocarcinoma presented with large left upper lobe lung mass in addition to mediastinal lymphadenopathy and bilateral pulmonary nodules diagnosed in June 2021.  Biomarker Findings Tumor Mutational Burden - 10 Muts/Mb Microsatellite status - MS-Stable Genomic Findings For a complete list of the genes assayed, please refer to the Appendix. NF1 Q2582* KRAS G12V CTNNB1 A640f*10 TP53 H179N 7 Disease relevant genes with no reportable alterations: ALK, BRAF, EGFR, ERBB2, MET, RET, ROS1  PDL1 Expression: 100%  PRIOR THERAPY: Palliative radiotherapy to the large left upper lobe lung mass.  CURRENT THERAPY: Systemic chemotherapy with carboplatin for AUC of 5, Alimta 500 mg/M2 and Keytruda 200 mg IV every 3 weeks.  First dose 02/12/2020.  Status post 6 cycles.  Starting from cycle #5 she was on maintenance treatment with Alimta and Keytruda every 3 weeks.  INTERVAL HISTORY: Kayla [id number].o. female returns to the clinic today for follow-up visit accompanied by her husband.  The patient is feeling fine today with no concerning complaints except for fatigue and baseline shortness of breath increased with exertion and she is currently on home oxygen.  She denied having any chest pain, cough or hemoptysis.  She denied having any fever or chills.  She has no nausea, vomiting, diarrhea or constipation.  She denied having any headache or visual changes.  The patient has no recent weight loss or night sweats.  She has been tolerating her treatment well with no concerning adverse effects.  She had repeat CT scan of the chest, abdomen pelvis performed recently. The patient is here today for evaluation and discussion of her scan results before starting cycle  #7.   MEDICAL HISTORY: Past Medical History:  Diagnosis Date  . Arthritis   . COPD (chronic obstructive pulmonary disease) ([facility]   . Depression   . E. coli pyelonephritis 6//2014  . GERD (gastroesophageal reflux disease)   . Pneumonia   . Pulmonary hypertension ([facility]   . Sepsis ([facility]     ALLERGIES:  is allergic to sulfur.  MEDICATIONS:  Current Outpatient Medications  Medication Sig Dispense Refill  . acetaminophen (TYLENOL) 500 MG tablet Take 500 mg by mouth every 6 (six) hours as needed for mild pain.    .Marland Kitchenalbuterol (PROVENTIL) (2.5 MG/3ML) 0.083% nebulizer solution Take 3 mLs (2.5 mg total) by nebulization every 6 (six) hours as needed for wheezing or shortness of breath. 75 mL 12  . aspirin EC 81 MG tablet Take 1 tablet (81 mg total) by mouth daily.    .Marland Kitchenatorvastatin (LIPITOR) 40 MG tablet Take 40 mg by mouth daily.    . cephALEXin (KEFLEX) 500 MG capsule Take 500 mg by mouth 2 (two) times daily.    . folic acid (FOLVITE) 1 MG tablet Take 1 tablet (1 mg total) by mouth daily. 30 tablet 4  . levocetirizine (XYZAL) 5 MG tablet Take 5 mg by mouth every evening.     . lidocaine-prilocaine (EMLA) cream Apply to the Port-A-Cath site 30-60-minute before chemotherapy every 3 weeks. 30 g 0  . Multiple Vitamin (MULTIVITAMIN WITH MINERALS) TABS tablet Take 1 tablet by mouth daily.    . mupirocin ointment (BACTROBAN) 2 % SMARTSIG:1 Application Topical 2-3 Times Daily    . omeprazole (PRILOSEC) 20 MG capsule Take  20 mg by mouth daily.     . prochlorperazine (COMPAZINE) 10 MG tablet Take 1 tablet (10 mg total) by mouth every 6 (six) hours as needed for nausea or vomiting. 30 tablet 0  . sertraline (ZOLOFT) 100 MG tablet Take 100 mg by mouth daily.     . TRELEGY ELLIPTA 100-62.5-25 MCG/INH AEPB Inhale 1 puff into the lungs daily.     No current facility-administered medications for this visit.    SURGICAL HISTORY:  Past Surgical History:  Procedure Laterality Date  . ABDOMINAL  HYSTERECTOMY     partial  . AORTA - BILATERAL FEMORAL ARTERY BYPASS GRAFT Bilateral 06/14/2016   Procedure: AORTOBIFEMORAL BYPASS GRAFT AORTA SMA BYPASS GRAFT RE-IMPLANTATION  OF SUPERIOR MESENTERIC ARTERY;  Surgeon: Serafina Mitchell, MD;  Location: [facility];  Service: Vascular;  Laterality: Bilateral;  . BACK SURGERY     L4/5  . CARDIAC CATHETERIZATION N/A 01/03/2016   Procedure: Right/Left Heart Cath and Coronary Angiography;  Surgeon: Jolaine Artist, MD;  Location: [facility];  Service: Cardiovascular;  Laterality: N/A;  . COLONOSCOPY N/A 08/05/2015   Procedure: COLONOSCOPY;  Surgeon: Rogene Houston, MD;  [facility];  Service: Endoscopy;  Laterality: N/A;  730  . ELBOW SURGERY    . IR IMAGING GUIDED PORT INSERTION  02/16/2020  . PERIPHERAL VASCULAR CATHETERIZATION N/A 02/16/2016   Procedure: Abdominal Aortogram w/Lower Extremity;  Surgeon: Wellington Hampshire, MD;  Location: [facility];  Service: Cardiovascular;  Laterality: N/A;    REVIEW OF SYSTEMS:  Constitutional: positive for fatigue Eyes: negative Ears, nose, mouth, throat, and face: negative Respiratory: positive for dyspnea on exertion Cardiovascular: negative Gastrointestinal: negative Genitourinary:negative Integument/breast: negative Hematologic/lymphatic: negative Musculoskeletal:positive for muscle weakness Neurological: negative Behavioral/Psych: negative Endocrine: negative Allergic/Immunologic: negative   PHYSICAL EXAMINATION: General appearance: alert, cooperative, fatigued and no distress Head: Normocephalic, without obvious abnormality, atraumatic Neck: no adenopathy, no JVD, supple, symmetrical, trachea midline and thyroid not enlarged, symmetric, no tenderness/mass/nodules Lymph nodes: Cervical, supraclavicular, and axillary nodes normal. Resp: clear to auscultation bilaterally Back: symmetric, no curvature. ROM normal. No CVA tenderness. Cardio: regular rate and rhythm, S1, S2 normal,  no murmur, click, rub or gallop GI: soft, non-tender; bowel sounds normal; no masses,  no organomegaly Extremities: extremities normal, atraumatic, no cyanosis or edema Neurologic: Alert and oriented X 3, normal strength and tone. Normal symmetric reflexes. Normal coordination and gait  ECOG PERFORMANCE STATUS: 1 - Symptomatic but completely ambulatory  Blood pressure 100/67, pulse (!) 112, temperature (!) 97.3 F (36.3 C), temperature source Tympanic, resp. rate 18, height _0  (1.727 m), weight 142 lb 4.8 oz (64.5 kg), SpO2 97 %.  LABORATORY DATA: Lab Results  Component Value Date   WBC 8.1 06/24/2020   HGB 11.1 (L) 06/24/2020   HCT 34.0 (L) 06/24/2020   MCV 97.4 06/24/2020   PLT 166 06/24/2020      Chemistry      Component Value Date/Time   NA 142 06/03/2020 1105   K 3.8 06/03/2020 1105   CL 107 06/03/2020 1105   CO2 25 06/03/2020 1105   BUN 31 (H) 06/03/2020 1105   CREATININE 2.03 (H) 06/03/2020 1105   CREATININE 0.90 02/08/2016 0949      Component Value Date/Time   CALCIUM 8.1 (L) 06/03/2020 1105   ALKPHOS 61 06/03/2020 1105   AST 23 06/03/2020 1105   ALT 17 06/03/2020 1105   BILITOT 0.3 06/03/2020 1105       RADIOGRAPHIC STUDIES: CT  Abdomen Pelvis Wo Contrast  Result Date: 06/23/2020 CLINICAL DATA:  Lung cancer. History of radiotherapy to the left lung. Currently on systemic chemotherapy. EXAM: CT CHEST, ABDOMEN AND PELVIS WITHOUT CONTRAST TECHNIQUE: Multidetector CT imaging of the chest, abdomen and pelvis was performed following the standard protocol without IV contrast. COMPARISON:  04/19/2020. FINDINGS: CT CHEST FINDINGS Cardiovascular: Right IJ Port-A-Cath terminates in the right atrium. Atherosclerotic calcification of the aorta and coronary arteries. Pulmonic trunk is enlarged. Heart size within normal limits. Small pericardial effusion, new/increased. Mediastinum/Nodes: No pathologically enlarged mediastinal or axillary lymph nodes. Hilar regions are  difficult to evaluate without IV contrast. Esophagus is grossly unremarkable. Lungs/Pleura: Centrilobular emphysema. Post treatment scarring and volume loss in the anterior left upper lobe, unchanged. Medial right lower lobe nodule measures 5 x 9 mm (6/117). While similar in size, it appears less dense than on 04/19/2020, suggesting treatment response. Previously measured 5 mm nodule in the medial left lower lobe has resolved. Trace left pleural fluid. Debris is seen in the airway. Left lower lobe mucoid impaction. Musculoskeletal: Degenerative changes in the spine. No worrisome lytic or sclerotic lesions. CT ABDOMEN PELVIS FINDINGS Hepatobiliary: Liver and gallbladder are unremarkable. No biliary ductal dilatation. Pancreas: Negative. Spleen: Negative. Adrenals/Urinary Tract: Adrenal glands are unremarkable. Punctate stone in the lower pole right kidney. Kidneys are otherwise unremarkable. Ureters are decompressed. Bladder is grossly unremarkable. Stomach/Bowel: Stomach is decompressed. Small bowel, appendix and colon are unremarkable. Vascular/Lymphatic: Aorto bi-iliac bypass grafting. No pathologically enlarged lymph nodes. Reproductive: Hysterectomy.  No adnexal mass. Other: No free fluid.  Mesenteries and peritoneum are unremarkable. Musculoskeletal: Degenerative changes in the spine. No worrisome lytic or sclerotic lesions. IMPRESSION: 1. Interval response to therapy as evidenced by resolution of a left lower lobe nodule and decreased density of a right lower lobe nodule. 2. Small pericardial effusion. 3. Punctate right renal stone. 4. Aortic atherosclerosis (ICD10-I70.0). Coronary artery calcification. 5. Enlarged pulmonic trunk, indicative of pulmonary arterial hypertension. 6.  Emphysema (ICD10-J43.9). Electronically Signed   By: Lorin Picket M.D.   On: 06/23/2020 11:03   CT Chest Wo Contrast  Result Date: 06/23/2020 CLINICAL DATA:  Lung cancer. History of radiotherapy to the left lung. Currently on  systemic chemotherapy. EXAM: CT CHEST, ABDOMEN AND PELVIS WITHOUT CONTRAST TECHNIQUE: Multidetector CT imaging of the chest, abdomen and pelvis was performed following the standard protocol without IV contrast. COMPARISON:  04/19/2020. FINDINGS: CT CHEST FINDINGS Cardiovascular: Right IJ Port-A-Cath terminates in the right atrium. Atherosclerotic calcification of the aorta and coronary arteries. Pulmonic trunk is enlarged. Heart size within normal limits. Small pericardial effusion, new/increased. Mediastinum/Nodes: No pathologically enlarged mediastinal or axillary lymph nodes. Hilar regions are difficult to evaluate without IV contrast. Esophagus is grossly unremarkable. Lungs/Pleura: Centrilobular emphysema. Post treatment scarring and volume loss in the anterior left upper lobe, unchanged. Medial right lower lobe nodule measures 5 x 9 mm (6/117). While similar in size, it appears less dense than on 04/19/2020, suggesting treatment response. Previously measured 5 mm nodule in the medial left lower lobe has resolved. Trace left pleural fluid. Debris is seen in the airway. Left lower lobe mucoid impaction. Musculoskeletal: Degenerative changes in the spine. No worrisome lytic or sclerotic lesions. CT ABDOMEN PELVIS FINDINGS Hepatobiliary: Liver and gallbladder are unremarkable. No biliary ductal dilatation. Pancreas: Negative. Spleen: Negative. Adrenals/Urinary Tract: Adrenal glands are unremarkable. Punctate stone in the lower pole right kidney. Kidneys are otherwise unremarkable. Ureters are decompressed. Bladder is grossly unremarkable. Stomach/Bowel: Stomach is decompressed. Small bowel, appendix and colon are unremarkable.  Vascular/Lymphatic: Aorto bi-iliac bypass grafting. No pathologically enlarged lymph nodes. Reproductive: Hysterectomy.  No adnexal mass. Other: No free fluid.  Mesenteries and peritoneum are unremarkable. Musculoskeletal: Degenerative changes in the spine. No worrisome lytic or sclerotic  lesions. IMPRESSION: 1. Interval response to therapy as evidenced by resolution of a left lower lobe nodule and decreased density of a right lower lobe nodule. 2. Small pericardial effusion. 3. Punctate right renal stone. 4. Aortic atherosclerosis (ICD10-I70.0). Coronary artery calcification. 5. Enlarged pulmonic trunk, indicative of pulmonary arterial hypertension. 6.  Emphysema (ICD10-J43.9). Electronically Signed   By: Lorin Picket M.D.   On: 06/23/2020 11:03    ASSESSMENT AND PLAN: This is a very pleasant 74 years old white female with a stage IV (T3, N2, M1a) non-small cell lung cancer, poorly differentiated adenocarcinoma diagnosed in June 2021 and presented with large left upper lobe lung mass with mediastinal invasion in addition to mediastinal lymphadenopathy as well as bilateral pulmonary nodules. The patient has no actionable mutations but PD-L1 expression is 100%. Because of the bulky disease I recommended for the patient to consider the combination chemotherapy.  She is interested in this option. She is currently being treated with carboplatin for AUC of 5, Alimta 500 mg/M2 and Keytruda 200 mg IV every 3 weeks status post 6 cycles.  Starting from cycle #5 she is on maintenance treatment with Alimta and Keytruda every 3 weeks. The patient had repeat CT scan of the chest, abdomen and pelvis performed recently.  I personally and independently reviewed the scans and discussed the results with the patient and her husband today. Her scan showed further improvement of her disease. I recommended for the patient to continue her maintenance therapy but she will proceed with only Keytruda for cycle #7 because of her worsening renal function.  I will hold her treatment with Alimta for now. The patient will come back for follow-up visit in 3 weeks for evaluation before the next cycle of her treatment. She was advised to call immediately if she has any concerning symptoms in the interval. The patient  voices understanding of current disease status and treatment options and is in agreement with the current care plan. All questions were answered. The patient knows to call the clinic with any problems, questions or concerns. We can certainly see the patient much sooner if necessary.  Disclaimer: This note was dictated with voice recognition software. Similar sounding words can inadvertently be transcribed and may not be corrected upon review.

## 2020-06-24 NOTE — Progress Notes (Signed)
Ok to treat pt with Keytruda and creatinine 2.21.Per Kayla Sawyer pt is not getting ALimta today.

## 2020-07-10 DIAGNOSIS — J432 Centrilobular emphysema: Secondary | ICD-10-CM | POA: Diagnosis not present

## 2020-07-10 DIAGNOSIS — J849 Interstitial pulmonary disease, unspecified: Secondary | ICD-10-CM | POA: Diagnosis not present

## 2020-07-10 DIAGNOSIS — J449 Chronic obstructive pulmonary disease, unspecified: Secondary | ICD-10-CM | POA: Diagnosis not present

## 2020-07-15 ENCOUNTER — Inpatient Hospital Stay (HOSPITAL_BASED_OUTPATIENT_CLINIC_OR_DEPARTMENT_OTHER): Payer: Medicare Other | Admitting: Internal Medicine

## 2020-07-15 ENCOUNTER — Ambulatory Visit: Payer: Medicare Other

## 2020-07-15 ENCOUNTER — Other Ambulatory Visit: Payer: Medicare Other

## 2020-07-15 ENCOUNTER — Telehealth: Payer: Self-pay | Admitting: Internal Medicine

## 2020-07-15 ENCOUNTER — Inpatient Hospital Stay: Payer: Medicare Other

## 2020-07-15 ENCOUNTER — Ambulatory Visit: Payer: Medicare Other | Admitting: Physician Assistant

## 2020-07-15 ENCOUNTER — Other Ambulatory Visit: Payer: Self-pay | Admitting: Internal Medicine

## 2020-07-15 ENCOUNTER — Other Ambulatory Visit: Payer: Self-pay

## 2020-07-15 VITALS — BP 101/64 | HR 98 | Temp 97.0°F | Resp 20 | Ht 68.0 in | Wt 139.8 lb

## 2020-07-15 DIAGNOSIS — Z5112 Encounter for antineoplastic immunotherapy: Secondary | ICD-10-CM | POA: Diagnosis not present

## 2020-07-15 DIAGNOSIS — Z5111 Encounter for antineoplastic chemotherapy: Secondary | ICD-10-CM

## 2020-07-15 DIAGNOSIS — C3492 Malignant neoplasm of unspecified part of left bronchus or lung: Secondary | ICD-10-CM

## 2020-07-15 DIAGNOSIS — Z95828 Presence of other vascular implants and grafts: Secondary | ICD-10-CM

## 2020-07-15 DIAGNOSIS — C3412 Malignant neoplasm of upper lobe, left bronchus or lung: Secondary | ICD-10-CM | POA: Diagnosis not present

## 2020-07-15 DIAGNOSIS — Z79899 Other long term (current) drug therapy: Secondary | ICD-10-CM | POA: Diagnosis not present

## 2020-07-15 LAB — CBC WITH DIFFERENTIAL (CANCER CENTER ONLY)
Abs Immature Granulocytes: 0.04 10*3/uL (ref 0.00–0.07)
Basophils Absolute: 0 10*3/uL (ref 0.0–0.1)
Basophils Relative: 1 %
Eosinophils Absolute: 0.1 10*3/uL (ref 0.0–0.5)
Eosinophils Relative: 1 %
HCT: 32.4 % — ABNORMAL LOW (ref 36.0–46.0)
Hemoglobin: 10.5 g/dL — ABNORMAL LOW (ref 12.0–15.0)
Immature Granulocytes: 1 %
Lymphocytes Relative: 12 %
Lymphs Abs: 0.7 10*3/uL (ref 0.7–4.0)
MCH: 32.4 pg (ref 26.0–34.0)
MCHC: 32.4 g/dL (ref 30.0–36.0)
MCV: 100 fL (ref 80.0–100.0)
Monocytes Absolute: 0.6 10*3/uL (ref 0.1–1.0)
Monocytes Relative: 11 %
Neutro Abs: 4.4 10*3/uL (ref 1.7–7.7)
Neutrophils Relative %: 74 %
Platelet Count: 134 10*3/uL — ABNORMAL LOW (ref 150–400)
RBC: 3.24 MIL/uL — ABNORMAL LOW (ref 3.87–5.11)
RDW: 16.8 % — ABNORMAL HIGH (ref 11.5–15.5)
WBC Count: 5.9 10*3/uL (ref 4.0–10.5)
nRBC: 0 % (ref 0.0–0.2)

## 2020-07-15 LAB — CMP (CANCER CENTER ONLY)
ALT: 15 U/L (ref 0–44)
AST: 19 U/L (ref 15–41)
Albumin: 3.4 g/dL — ABNORMAL LOW (ref 3.5–5.0)
Alkaline Phosphatase: 61 U/L (ref 38–126)
Anion gap: 8 (ref 5–15)
BUN: 50 mg/dL — ABNORMAL HIGH (ref 8–23)
CO2: 23 mmol/L (ref 22–32)
Calcium: 8.8 mg/dL — ABNORMAL LOW (ref 8.9–10.3)
Chloride: 110 mmol/L (ref 98–111)
Creatinine: 2.17 mg/dL — ABNORMAL HIGH (ref 0.44–1.00)
GFR, Estimated: 23 mL/min — ABNORMAL LOW (ref >60)
Glucose, Bld: 160 mg/dL — ABNORMAL HIGH (ref 70–99)
Potassium: 3.8 mmol/L (ref 3.5–5.1)
Sodium: 141 mmol/L (ref 135–145)
Total Bilirubin: 0.3 mg/dL (ref 0.3–1.2)
Total Protein: 7 g/dL (ref 6.5–8.1)

## 2020-07-15 LAB — TSH: TSH: 0.726 u[IU]/mL (ref 0.308–3.960)

## 2020-07-15 MED ORDER — PROCHLORPERAZINE MALEATE 10 MG PO TABS
10.0000 mg | ORAL_TABLET | Freq: Once | ORAL | Status: AC
  Administered 2020-07-15: 10 mg via ORAL

## 2020-07-15 MED ORDER — CYANOCOBALAMIN 1000 MCG/ML IJ SOLN: 1000.0000 ug | Freq: Once | INTRAMUSCULAR | Status: DC

## 2020-07-15 MED ORDER — SODIUM CHLORIDE 0.9% FLUSH
10.0000 mL | Freq: Once | INTRAVENOUS | Status: AC
  Administered 2020-07-15: 10 mL
  Filled 2020-07-15: qty 10

## 2020-07-15 MED ORDER — CYANOCOBALAMIN 1000 MCG/ML IJ SOLN
INTRAMUSCULAR | Status: AC
  Filled 2020-07-15: qty 1

## 2020-07-15 MED ORDER — SODIUM CHLORIDE 0.9 % IV SOLN
200.0000 mg | Freq: Once | INTRAVENOUS | Status: AC
  Administered 2020-07-15: 200 mg via INTRAVENOUS
  Filled 2020-07-15: qty 8

## 2020-07-15 MED ORDER — HEPARIN SOD (PORK) LOCK FLUSH 100 UNIT/ML IV SOLN
500.0000 [IU] | Freq: Once | INTRAVENOUS | Status: AC | PRN
  Administered 2020-07-15: 500 [IU]
  Filled 2020-07-15: qty 5

## 2020-07-15 MED ORDER — SODIUM CHLORIDE 0.9 % IV SOLN
Freq: Once | INTRAVENOUS | Status: AC
  Filled 2020-07-15: qty 250

## 2020-07-15 MED ORDER — PEMETREXED DISODIUM CHEMO INJECTION 500 MG: 400.0000 mg/m2 | Freq: Once | INTRAVENOUS | Status: DC

## 2020-07-15 MED ORDER — PROCHLORPERAZINE MALEATE 10 MG PO TABS
ORAL_TABLET | ORAL | Status: AC
  Filled 2020-07-15: qty 1

## 2020-07-15 MED ORDER — SODIUM CHLORIDE 0.9% FLUSH
10.0000 mL | INTRAVENOUS | Status: DC | PRN
  Administered 2020-07-15: 10 mL
  Filled 2020-07-15: qty 10

## 2020-07-15 NOTE — Telephone Encounter (Signed)
Scheduled appt per 12/30 los- pt to get an updated schedule next visit

## 2020-07-15 NOTE — Progress Notes (Signed)
Ok to treat with elevated creatinine today per MD Julien Nordmann.

## 2020-07-15 NOTE — Patient Instructions (Signed)

## 2020-07-15 NOTE — Patient Instructions (Signed)
[facility] Discharge Instructions for Patients Receiving Chemotherapy  Today you received the following chemotherapy agent: Keytruda  To help prevent nausea and vomiting after your treatment, we encourage you to take your nausea medication  as prescribed.   If you develop nausea and vomiting that is not controlled by your nausea medication, call the clinic.   BELOW ARE SYMPTOMS THAT SHOULD BE REPORTED IMMEDIATELY:  *FEVER GREATER THAN 100.5 F  *CHILLS WITH OR WITHOUT FEVER  NAUSEA AND VOMITING THAT IS NOT CONTROLLED WITH YOUR NAUSEA MEDICATION  *UNUSUAL SHORTNESS OF BREATH  *UNUSUAL BRUISING OR BLEEDING  TENDERNESS IN MOUTH AND THROAT WITH OR WITHOUT PRESENCE OF ULCERS  *URINARY PROBLEMS  *BOWEL PROBLEMS  UNUSUAL RASH Items with * indicate a potential emergency and should be followed up as soon as possible.  Feel free to call the clinic should you have any questions or concerns. The clinic phone number is [phone].  Please show the [facility] at check-in to the Emergency Department and triage nurse.

## 2020-07-15 NOTE — Progress Notes (Signed)
[place] [phone]   [phone]  OFFICE PROGRESS NOTE  Celene Squibb, MD [address] [place] [address]  DIAGNOSIS: Stage IV (T3, N2, M1a) non-small cell lung cancer, poorly differentiated adenocarcinoma presented with large left upper lobe lung mass in addition to mediastinal lymphadenopathy and bilateral pulmonary nodules diagnosed in June 2021.  Biomarker Findings Tumor Mutational Burden - 10 Muts/Mb Microsatellite status - MS-Stable Genomic Findings For a complete list of the genes assayed, please refer to the Appendix. NF1 Q2582* KRAS G12V CTNNB1 A651fs*10 TP53 H179N 7 Disease relevant genes with no reportable alterations: ALK, BRAF, EGFR, ERBB2, MET, RET, ROS1  PDL1 Expression: 100%  PRIOR THERAPY: Palliative radiotherapy to the large left upper lobe lung mass.  CURRENT THERAPY: Systemic chemotherapy with carboplatin for AUC of 5, Alimta 500 mg/M2 and Keytruda 200 mg IV every 3 weeks.  First dose 02/12/2020.  Status post 7 cycles.  Starting from cycle #5 she was on maintenance treatment with Alimta and Keytruda every 3 weeks.  INTERVAL HISTORY: Kayla Sawyer 74 y.o. female returns to the clinic today for follow-up visit accompanied by her husband.  The patient is feeling fine today with no concerning complaints except for the baseline shortness of breath and she is currently on home oxygen.  She denied having any chest pain, cough or hemoptysis.  She denied having any fever or chills.  She has no nausea, vomiting, diarrhea or constipation.  She denied having any headache or visual changes.  She is here today for evaluation before starting cycle #8 of her treatment.   MEDICAL HISTORY: Past Medical History:  Diagnosis Date  . Arthritis   . COPD (chronic obstructive pulmonary disease) ([place])   . Depression   . E. coli pyelonephritis 6//2014  . GERD (gastroesophageal reflux disease)   . Pneumonia   . Pulmonary hypertension  ([place])   . Sepsis ([place])     ALLERGIES:  is allergic to sulfur.  MEDICATIONS:  Current Outpatient Medications  Medication Sig Dispense Refill  . acetaminophen (TYLENOL) 500 MG tablet Take 500 mg by mouth every 6 (six) hours as needed for mild pain.    Marland Kitchen albuterol (PROVENTIL) (2.5 MG/3ML) 0.083% nebulizer solution Take 3 mLs (2.5 mg total) by nebulization every 6 (six) hours as needed for wheezing or shortness of breath. 75 mL 12  . aspirin EC 81 MG tablet Take 1 tablet (81 mg total) by mouth daily.    Marland Kitchen atorvastatin (LIPITOR) 40 MG tablet Take 40 mg by mouth daily.    . cephALEXin (KEFLEX) 500 MG capsule Take 500 mg by mouth 2 (two) times daily.    . folic acid (FOLVITE) 1 MG tablet Take 1 tablet (1 mg total) by mouth daily. 30 tablet 4  . levocetirizine (XYZAL) 5 MG tablet Take 5 mg by mouth every evening.     . lidocaine-prilocaine (EMLA) cream Apply to the Port-A-Cath site 30-60-minute before chemotherapy every 3 weeks. 30 g 0  . Multiple Vitamin (MULTIVITAMIN WITH MINERALS) TABS tablet Take 1 tablet by mouth daily.    . mupirocin ointment (BACTROBAN) 2 % SMARTSIG:1 Application Topical 2-3 Times Daily    . omeprazole (PRILOSEC) 20 MG capsule Take 20 mg by mouth daily.     . prochlorperazine (COMPAZINE) 10 MG tablet Take 1 tablet (10 mg total) by mouth every 6 (six) hours as needed for nausea or vomiting. 30 tablet 0  . sertraline (ZOLOFT) 100 MG tablet Take  100 mg by mouth daily.     . TRELEGY ELLIPTA 100-62.5-25 MCG/INH AEPB Inhale 1 puff into the lungs daily.     No current facility-administered medications for this visit.    SURGICAL HISTORY:  Past Surgical History:  Procedure Laterality Date  . ABDOMINAL HYSTERECTOMY     partial  . AORTA - BILATERAL FEMORAL ARTERY BYPASS GRAFT Bilateral 06/14/2016   Procedure: AORTOBIFEMORAL BYPASS GRAFT AORTA SMA BYPASS GRAFT RE-IMPLANTATION  OF SUPERIOR MESENTERIC ARTERY;  Surgeon: Serafina Mitchell, MD;  Location: [facility];  Service: Vascular;   Laterality: Bilateral;  . BACK SURGERY     L4/5  . CARDIAC CATHETERIZATION N/A 01/03/2016   Procedure: Right/Left Heart Cath and Coronary Angiography;  Surgeon: Jolaine Artist, MD;  Location: [facility];  Service: Cardiovascular;  Laterality: N/A;  . COLONOSCOPY N/A 08/05/2015   Procedure: COLONOSCOPY;  Surgeon: Rogene Houston, MD;  [facility];  Service: Endoscopy;  Laterality: N/A;  730  . ELBOW SURGERY    . IR IMAGING GUIDED PORT INSERTION  02/16/2020  . PERIPHERAL VASCULAR CATHETERIZATION N/A 02/16/2016   Procedure: Abdominal Aortogram w/Lower Extremity;  Surgeon: Wellington Hampshire, MD;  Location: [facility];  Service: Cardiovascular;  Laterality: N/A;    REVIEW OF SYSTEMS:  A comprehensive review of systems was negative except for: Constitutional: positive for fatigue Respiratory: positive for dyspnea on exertion   PHYSICAL EXAMINATION: General appearance: alert, cooperative, fatigued and no distress Head: Normocephalic, without obvious abnormality, atraumatic Neck: no adenopathy, no JVD, supple, symmetrical, trachea midline and thyroid not enlarged, symmetric, no tenderness/mass/nodules Lymph nodes: Cervical, supraclavicular, and axillary nodes normal. Resp: clear to auscultation bilaterally Back: symmetric, no curvature. ROM normal. No CVA tenderness. Cardio: regular rate and rhythm, S1, S2 normal, no murmur, click, rub or gallop GI: soft, non-tender; bowel sounds normal; no masses,  no organomegaly Extremities: extremities normal, atraumatic, no cyanosis or edema  ECOG PERFORMANCE STATUS: 1 - Symptomatic but completely ambulatory  Blood pressure 101/64, pulse 98, temperature (!) 97 F (36.1 C), temperature source Tympanic, resp. rate 20, height $RemoveBe'5\' 8"'kmMveJXxt$  (1.727 m), weight 139 lb 12.8 oz (63.4 kg), SpO2 98 %.  LABORATORY DATA: Lab Results  Component Value Date   WBC 5.9 07/15/2020   HGB 10.5 (L) 07/15/2020   HCT 32.4 (L) 07/15/2020   MCV 100.0  07/15/2020   PLT 134 (L) 07/15/2020      Chemistry      Component Value Date/Time   NA 142 06/24/2020 0935   K 3.8 06/24/2020 0935   CL 105 06/24/2020 0935   CO2 25 06/24/2020 0935   BUN 38 (H) 06/24/2020 0935   CREATININE 2.21 (H) 06/24/2020 0935   CREATININE 0.90 02/08/2016 0949      Component Value Date/Time   CALCIUM 9.3 06/24/2020 0935   ALKPHOS 68 06/24/2020 0935   AST 20 06/24/2020 0935   ALT 10 06/24/2020 0935   BILITOT 0.4 06/24/2020 0935       RADIOGRAPHIC STUDIES: CT Abdomen Pelvis Wo Contrast  Result Date: 06/23/2020 CLINICAL DATA:  Lung cancer. History of radiotherapy to the left lung. Currently on systemic chemotherapy. EXAM: CT CHEST, ABDOMEN AND PELVIS WITHOUT CONTRAST TECHNIQUE: Multidetector CT imaging of the chest, abdomen and pelvis was performed following the standard protocol without IV contrast. COMPARISON:  04/19/2020. FINDINGS: CT CHEST FINDINGS Cardiovascular: Right IJ Port-A-Cath terminates in the right atrium. Atherosclerotic calcification of the aorta and coronary arteries. Pulmonic trunk is enlarged. Heart size within normal  limits. Small pericardial effusion, new/increased. Mediastinum/Nodes: No pathologically enlarged mediastinal or axillary lymph nodes. Hilar regions are difficult to evaluate without IV contrast. Esophagus is grossly unremarkable. Lungs/Pleura: Centrilobular emphysema. Post treatment scarring and volume loss in the anterior left upper lobe, unchanged. Medial right lower lobe nodule measures 5 x 9 mm (6/117). While similar in size, it appears less dense than on 04/19/2020, suggesting treatment response. Previously measured 5 mm nodule in the medial left lower lobe has resolved. Trace left pleural fluid. Debris is seen in the airway. Left lower lobe mucoid impaction. Musculoskeletal: Degenerative changes in the spine. No worrisome lytic or sclerotic lesions. CT ABDOMEN PELVIS FINDINGS Hepatobiliary: Liver and gallbladder are unremarkable. No  biliary ductal dilatation. Pancreas: Negative. Spleen: Negative. Adrenals/Urinary Tract: Adrenal glands are unremarkable. Punctate stone in the lower pole right kidney. Kidneys are otherwise unremarkable. Ureters are decompressed. Bladder is grossly unremarkable. Stomach/Bowel: Stomach is decompressed. Small bowel, appendix and colon are unremarkable. Vascular/Lymphatic: Aorto bi-iliac bypass grafting. No pathologically enlarged lymph nodes. Reproductive: Hysterectomy.  No adnexal mass. Other: No free fluid.  Mesenteries and peritoneum are unremarkable. Musculoskeletal: Degenerative changes in the spine. No worrisome lytic or sclerotic lesions. IMPRESSION: 1. Interval response to therapy as evidenced by resolution of a left lower lobe nodule and decreased density of a right lower lobe nodule. 2. Small pericardial effusion. 3. Punctate right renal stone. 4. Aortic atherosclerosis (ICD10-I70.0). Coronary artery calcification. 5. Enlarged pulmonic trunk, indicative of pulmonary arterial hypertension. 6.  Emphysema (ICD10-J43.9). Electronically Signed   By: Lorin Picket M.D.   On: 06/23/2020 11:03   CT Chest Wo Contrast  Result Date: 06/23/2020 CLINICAL DATA:  Lung cancer. History of radiotherapy to the left lung. Currently on systemic chemotherapy. EXAM: CT CHEST, ABDOMEN AND PELVIS WITHOUT CONTRAST TECHNIQUE: Multidetector CT imaging of the chest, abdomen and pelvis was performed following the standard protocol without IV contrast. COMPARISON:  04/19/2020. FINDINGS: CT CHEST FINDINGS Cardiovascular: Right IJ Port-A-Cath terminates in the right atrium. Atherosclerotic calcification of the aorta and coronary arteries. Pulmonic trunk is enlarged. Heart size within normal limits. Small pericardial effusion, new/increased. Mediastinum/Nodes: No pathologically enlarged mediastinal or axillary lymph nodes. Hilar regions are difficult to evaluate without IV contrast. Esophagus is grossly unremarkable. Lungs/Pleura:  Centrilobular emphysema. Post treatment scarring and volume loss in the anterior left upper lobe, unchanged. Medial right lower lobe nodule measures 5 x 9 mm (6/117). While similar in size, it appears less dense than on 04/19/2020, suggesting treatment response. Previously measured 5 mm nodule in the medial left lower lobe has resolved. Trace left pleural fluid. Debris is seen in the airway. Left lower lobe mucoid impaction. Musculoskeletal: Degenerative changes in the spine. No worrisome lytic or sclerotic lesions. CT ABDOMEN PELVIS FINDINGS Hepatobiliary: Liver and gallbladder are unremarkable. No biliary ductal dilatation. Pancreas: Negative. Spleen: Negative. Adrenals/Urinary Tract: Adrenal glands are unremarkable. Punctate stone in the lower pole right kidney. Kidneys are otherwise unremarkable. Ureters are decompressed. Bladder is grossly unremarkable. Stomach/Bowel: Stomach is decompressed. Small bowel, appendix and colon are unremarkable. Vascular/Lymphatic: Aorto bi-iliac bypass grafting. No pathologically enlarged lymph nodes. Reproductive: Hysterectomy.  No adnexal mass. Other: No free fluid.  Mesenteries and peritoneum are unremarkable. Musculoskeletal: Degenerative changes in the spine. No worrisome lytic or sclerotic lesions. IMPRESSION: 1. Interval response to therapy as evidenced by resolution of a left lower lobe nodule and decreased density of a right lower lobe nodule. 2. Small pericardial effusion. 3. Punctate right renal stone. 4. Aortic atherosclerosis (ICD10-I70.0). Coronary artery calcification. 5. Enlarged pulmonic trunk,  indicative of pulmonary arterial hypertension. 6.  Emphysema (ICD10-J43.9). Electronically Signed   By: Lorin Picket M.D.   On: 06/23/2020 11:03    ASSESSMENT AND PLAN: This is a very pleasant 74 years old white female with a stage IV (T3, N2, M1a) non-small cell lung cancer, poorly differentiated adenocarcinoma diagnosed in June 2021 and presented with large left  upper lobe lung mass with mediastinal invasion in addition to mediastinal lymphadenopathy as well as bilateral pulmonary nodules. The patient has no actionable mutations but PD-L1 expression is 100%. Because of the bulky disease I recommended for the patient to consider the combination chemotherapy.  She is interested in this option. She is currently being treated with carboplatin for AUC of 5, Alimta 500 mg/M2 and Keytruda 200 mg IV every 3 weeks status post 7 cycles.  Starting from cycle #5 she is on maintenance treatment with Alimta and Keytruda every 3 weeks. The patient has been tolerating her treatment well with no concerning adverse effects. I recommended for her to proceed with cycle #8 today as planned. I will see her back for follow-up visit in 3 weeks for evaluation before the next cycle of her treatment. The patient was advised to call immediately if she has any other concerning symptoms in the interval The patient voices understanding of current disease status and treatment options and is in agreement with the current care plan. All questions were answered. The patient knows to call the clinic with any problems, questions or concerns. We can certainly see the patient much sooner if necessary.  Disclaimer: This note was dictated with voice recognition software. Similar sounding words can inadvertently be transcribed and may not be corrected upon review.

## 2020-07-15 NOTE — Progress Notes (Signed)
Hold alimta today per Dr Julien Nordmann

## 2020-07-16 DIAGNOSIS — J449 Chronic obstructive pulmonary disease, unspecified: Secondary | ICD-10-CM | POA: Diagnosis not present

## 2020-08-03 ENCOUNTER — Ambulatory Visit: Payer: Medicare Other | Admitting: Pulmonary Disease

## 2020-08-05 ENCOUNTER — Inpatient Hospital Stay: Payer: Medicare Other | Admitting: Internal Medicine

## 2020-08-05 ENCOUNTER — Inpatient Hospital Stay: Payer: Medicare Other

## 2020-08-05 ENCOUNTER — Inpatient Hospital Stay: Payer: Medicare Other | Attending: Internal Medicine

## 2020-08-05 ENCOUNTER — Other Ambulatory Visit: Payer: Self-pay

## 2020-08-05 ENCOUNTER — Encounter: Payer: Self-pay | Admitting: Internal Medicine

## 2020-08-05 VITALS — BP 101/63 | HR 111 | Temp 97.0°F | Resp 18 | Ht 68.0 in | Wt 141.0 lb

## 2020-08-05 DIAGNOSIS — N289 Disorder of kidney and ureter, unspecified: Secondary | ICD-10-CM | POA: Diagnosis not present

## 2020-08-05 DIAGNOSIS — C3490 Malignant neoplasm of unspecified part of unspecified bronchus or lung: Secondary | ICD-10-CM

## 2020-08-05 DIAGNOSIS — C3412 Malignant neoplasm of upper lobe, left bronchus or lung: Secondary | ICD-10-CM | POA: Insufficient documentation

## 2020-08-05 DIAGNOSIS — Z5112 Encounter for antineoplastic immunotherapy: Secondary | ICD-10-CM

## 2020-08-05 DIAGNOSIS — I272 Pulmonary hypertension, unspecified: Secondary | ICD-10-CM | POA: Insufficient documentation

## 2020-08-05 DIAGNOSIS — C3492 Malignant neoplasm of unspecified part of left bronchus or lung: Secondary | ICD-10-CM | POA: Diagnosis not present

## 2020-08-05 DIAGNOSIS — R7989 Other specified abnormal findings of blood chemistry: Secondary | ICD-10-CM

## 2020-08-05 DIAGNOSIS — Z95828 Presence of other vascular implants and grafts: Secondary | ICD-10-CM

## 2020-08-05 DIAGNOSIS — Z79899 Other long term (current) drug therapy: Secondary | ICD-10-CM | POA: Insufficient documentation

## 2020-08-05 LAB — CMP (CANCER CENTER ONLY)
ALT: 10 U/L (ref 0–44)
AST: 16 U/L (ref 15–41)
Albumin: 3.4 g/dL — ABNORMAL LOW (ref 3.5–5.0)
Alkaline Phosphatase: 69 U/L (ref 38–126)
Anion gap: 9 (ref 5–15)
BUN: 46 mg/dL — ABNORMAL HIGH (ref 8–23)
CO2: 24 mmol/L (ref 22–32)
Calcium: 8.7 mg/dL — ABNORMAL LOW (ref 8.9–10.3)
Chloride: 106 mmol/L (ref 98–111)
Creatinine: 1.92 mg/dL — ABNORMAL HIGH (ref 0.44–1.00)
GFR, Estimated: 27 mL/min — ABNORMAL LOW (ref >60)
Glucose, Bld: 98 mg/dL (ref 70–99)
Potassium: 3.9 mmol/L (ref 3.5–5.1)
Sodium: 139 mmol/L (ref 135–145)
Total Bilirubin: 0.3 mg/dL (ref 0.3–1.2)
Total Protein: 7 g/dL (ref 6.5–8.1)

## 2020-08-05 LAB — CBC WITH DIFFERENTIAL (CANCER CENTER ONLY)
Abs Immature Granulocytes: 0.04 10*3/uL (ref 0.00–0.07)
Basophils Absolute: 0 10*3/uL (ref 0.0–0.1)
Basophils Relative: 0 %
Eosinophils Absolute: 0 10*3/uL (ref 0.0–0.5)
Eosinophils Relative: 0 %
HCT: 32.9 % — ABNORMAL LOW (ref 36.0–46.0)
Hemoglobin: 10.6 g/dL — ABNORMAL LOW (ref 12.0–15.0)
Immature Granulocytes: 1 %
Lymphocytes Relative: 13 %
Lymphs Abs: 0.9 10*3/uL (ref 0.7–4.0)
MCH: 32.4 pg (ref 26.0–34.0)
MCHC: 32.2 g/dL (ref 30.0–36.0)
MCV: 100.6 fL — ABNORMAL HIGH (ref 80.0–100.0)
Monocytes Absolute: 1 10*3/uL (ref 0.1–1.0)
Monocytes Relative: 14 %
Neutro Abs: 5.1 10*3/uL (ref 1.7–7.7)
Neutrophils Relative %: 72 %
Platelet Count: 156 10*3/uL (ref 150–400)
RBC: 3.27 MIL/uL — ABNORMAL LOW (ref 3.87–5.11)
RDW: 14.8 % (ref 11.5–15.5)
WBC Count: 7.1 10*3/uL (ref 4.0–10.5)
nRBC: 0 % (ref 0.0–0.2)

## 2020-08-05 LAB — TSH: TSH: 1.102 u[IU]/mL (ref 0.308–3.960)

## 2020-08-05 MED ORDER — SODIUM CHLORIDE 0.9 % IV SOLN
Freq: Once | INTRAVENOUS | Status: AC
  Filled 2020-08-05: qty 250

## 2020-08-05 MED ORDER — PROCHLORPERAZINE MALEATE 10 MG PO TABS
10.0000 mg | ORAL_TABLET | Freq: Once | ORAL | Status: AC
Start: 2020-08-05 — End: 2020-08-05
  Administered 2020-08-05: 10 mg via ORAL

## 2020-08-05 MED ORDER — CYANOCOBALAMIN 1000 MCG/ML IJ SOLN
1000.0000 ug | Freq: Once | INTRAMUSCULAR | Status: AC
  Administered 2020-08-05: 1000 ug via INTRAMUSCULAR

## 2020-08-05 MED ORDER — SODIUM CHLORIDE 0.9% FLUSH
10.0000 mL | INTRAVENOUS | Status: DC | PRN
  Administered 2020-08-05: 10 mL
  Filled 2020-08-05: qty 10

## 2020-08-05 MED ORDER — SODIUM CHLORIDE 0.9% FLUSH
10.0000 mL | Freq: Once | INTRAVENOUS | Status: AC
  Administered 2020-08-05: 10 mL
  Filled 2020-08-05: qty 10

## 2020-08-05 MED ORDER — HEPARIN SOD (PORK) LOCK FLUSH 100 UNIT/ML IV SOLN
500.0000 [IU] | Freq: Once | INTRAVENOUS | Status: AC | PRN
  Administered 2020-08-05: 500 [IU]
  Filled 2020-08-05: qty 5

## 2020-08-05 MED ORDER — SODIUM CHLORIDE 0.9 % IV SOLN
200.0000 mg | Freq: Once | INTRAVENOUS | Status: AC
  Administered 2020-08-05: 200 mg via INTRAVENOUS
  Filled 2020-08-05: qty 8

## 2020-08-05 NOTE — Progress Notes (Signed)
Ok to treat today with elevated Scr of 1.92 Per Doctor

## 2020-08-05 NOTE — Patient Instructions (Signed)
[facility] Discharge Instructions for Patients Receiving Chemotherapy  Today you received the following chemotherapy agent: Keytruda  To help prevent nausea and vomiting after your treatment, we encourage you to take your nausea medication  as prescribed.   If you develop nausea and vomiting that is not controlled by your nausea medication, call the clinic.   BELOW ARE SYMPTOMS THAT SHOULD BE REPORTED IMMEDIATELY:  *FEVER GREATER THAN 100.5 F  *CHILLS WITH OR WITHOUT FEVER  NAUSEA AND VOMITING THAT IS NOT CONTROLLED WITH YOUR NAUSEA MEDICATION  *UNUSUAL SHORTNESS OF BREATH  *UNUSUAL BRUISING OR BLEEDING  TENDERNESS IN MOUTH AND THROAT WITH OR WITHOUT PRESENCE OF ULCERS  *URINARY PROBLEMS  *BOWEL PROBLEMS  UNUSUAL RASH Items with * indicate a potential emergency and should be followed up as soon as possible.  Feel free to call the clinic should you have any questions or concerns. The clinic phone number is [phone].  Please show the [facility] at check-in to the Emergency Department and triage nurse.Cyanocobalamin, Vitamin B12 injection What is this medicine? CYANOCOBALAMIN (sye an oh koe BAL a min) is a man made form of vitamin B12. Vitamin B12 is used in the growth of healthy blood cells, nerve cells, and proteins in the body. It also helps with the metabolism of fats and carbohydrates. This medicine is used to treat people who can not absorb vitamin B12. This medicine may be used for other purposes; ask your health care provider or pharmacist if you have questions. COMMON BRAND NAME(S): B-12 Compliance Kit, B-12 Injection Kit, Cyomin, LA-12, Nutri-Twelve, Physicians EZ Use B-12, Primabalt What should I tell my health care provider before I take this medicine? They need to know if you have any of these conditions:  kidney disease  Leber's disease  megaloblastic anemia  an unusual or allergic reaction to cyanocobalamin, cobalt, other medicines,  foods, dyes, or preservatives  pregnant or trying to get pregnant  breast-feeding How should I use this medicine? This medicine is injected into a muscle or deeply under the skin. It is usually given by a health care professional in a clinic or doctor's office. However, your doctor may teach you how to inject yourself. Follow all instructions. Talk to your pediatrician regarding the use of this medicine in children. Special care may be needed. Overdosage: If you think you have taken too much of this medicine contact a poison control center or emergency room at once. NOTE: This medicine is only for you. Do not share this medicine with others. What if I miss a dose? If you are given your dose at a clinic or doctor's office, call to reschedule your appointment. If you give your own injections and you miss a dose, take it as soon as you can. If it is almost time for your next dose, take only that dose. Do not take double or extra doses. What may interact with this medicine?  colchicine  heavy alcohol intake This list may not describe all possible interactions. Give your health care provider a list of all the medicines, herbs, non-prescription drugs, or dietary supplements you use. Also tell them if you smoke, drink alcohol, or use illegal drugs. Some items may interact with your medicine. What should I watch for while using this medicine? Visit your doctor or health care professional regularly. You may need blood work done while you are taking this medicine. You may need to follow a special diet. Talk to your doctor. Limit your alcohol intake and avoid  smoking to get the best benefit. What side effects may I notice from receiving this medicine? Side effects that you should report to your doctor or health care professional as soon as possible:  allergic reactions like skin rash, itching or hives, swelling of the face, lips, or tongue  blue tint to skin  chest tightness, pain  difficulty  breathing, wheezing  dizziness  red, swollen painful area on the leg Side effects that usually do not require medical attention (report to your doctor or health care professional if they continue or are bothersome):  diarrhea  headache This list may not describe all possible side effects. Call your doctor for medical advice about side effects. You may report side effects to FDA at 1-800-FDA-1088. Where should I keep my medicine? Keep out of the reach of children. Store at room temperature between 15 and 30 degrees C (59 and 85 degrees F). Protect from light. Throw away any unused medicine after the expiration date. NOTE: This sheet is a summary. It may not cover all possible information. If you have questions about this medicine, talk to your doctor, pharmacist, or health care provider.  2021 Elsevier/Gold Standard (2007-10-14 22:10:20)

## 2020-08-05 NOTE — Progress Notes (Signed)
[facility] [phone]   [phone]  OFFICE PROGRESS NOTE  Celene Squibb, MD [address] [place] [address]  DIAGNOSIS: Stage IV (T3, N2, M1a) non-small cell lung cancer, poorly differentiated adenocarcinoma presented with large left upper lobe lung mass in addition to mediastinal lymphadenopathy and bilateral pulmonary nodules diagnosed in June 2021.  Biomarker Findings Tumor Mutational Burden - 10 Muts/Mb Microsatellite status - MS-Stable Genomic Findings For a complete list of the genes assayed, please refer to the Appendix. NF1 Q2582* KRAS G12V CTNNB1 A638fs*10 TP53 H179N 7 Disease relevant genes with no reportable alterations: ALK, BRAF, EGFR, ERBB2, MET, RET, ROS1  PDL1 Expression: 100%  PRIOR THERAPY: Palliative radiotherapy to the large left upper lobe lung mass.  CURRENT THERAPY: Systemic chemotherapy with carboplatin for AUC of 5, Alimta 500 mg/M2 and Keytruda 200 mg IV every 3 weeks.  First dose 02/12/2020.  Status post 8 cycles.  Starting from cycle #5 she was on maintenance treatment with Alimta and Keytruda every 3 weeks.  Starting cycle #9 she will be on single agent Keytruda every 3 weeks.  INTERVAL HISTORY: CAELIN ROSEN 75 y.o. female returns to the clinic today for follow-up visit accompanied by her husband.  The patient is feeling fine today with no concerning complaints except for the baseline shortness of breath and she is currently on home oxygen.  She is followed by Dr. Elsworth Soho for her COPD.  The patient denied having any current chest pain, cough or hemoptysis.  She denied having any nausea, vomiting, diarrhea or constipation.  She has mild fatigue.  She is here today for evaluation before starting cycle #9 of her treatment.  MEDICAL HISTORY: Past Medical History:  Diagnosis Date  . Arthritis   . COPD (chronic obstructive pulmonary disease) ([place])   . Depression   . E. coli pyelonephritis 6//2014  . GERD  (gastroesophageal reflux disease)   . Pneumonia   . Pulmonary hypertension ([place])   . Sepsis ([place])     ALLERGIES:  is allergic to elemental sulfur.  MEDICATIONS:  Current Outpatient Medications  Medication Sig Dispense Refill  . acetaminophen (TYLENOL) 500 MG tablet Take 500 mg by mouth every 6 (six) hours as needed for mild pain.    Marland Kitchen albuterol (PROVENTIL) (2.5 MG/3ML) 0.083% nebulizer solution Take 3 mLs (2.5 mg total) by nebulization every 6 (six) hours as needed for wheezing or shortness of breath. 75 mL 12  . aspirin EC 81 MG tablet Take 1 tablet (81 mg total) by mouth daily.    Marland Kitchen atorvastatin (LIPITOR) 40 MG tablet Take 40 mg by mouth daily.    . cephALEXin (KEFLEX) 500 MG capsule Take 500 mg by mouth 2 (two) times daily.    . folic acid (FOLVITE) 1 MG tablet Take 1 tablet (1 mg total) by mouth daily. 30 tablet 4  . levocetirizine (XYZAL) 5 MG tablet Take 5 mg by mouth every evening.     . lidocaine-prilocaine (EMLA) cream Apply to the Port-A-Cath site 30-60-minute before chemotherapy every 3 weeks. 30 g 0  . Multiple Vitamin (MULTIVITAMIN WITH MINERALS) TABS tablet Take 1 tablet by mouth daily.    . mupirocin ointment (BACTROBAN) 2 % SMARTSIG:1 Application Topical 2-3 Times Daily    . omeprazole (PRILOSEC) 20 MG capsule Take 20 mg by mouth daily.     . prochlorperazine (COMPAZINE) 10 MG tablet Take 1 tablet (10 mg total) by mouth every 6 (six) hours as needed  for nausea or vomiting. 30 tablet 0  . sertraline (ZOLOFT) 100 MG tablet Take 100 mg by mouth daily.     . TRELEGY ELLIPTA 100-62.5-25 MCG/INH AEPB Inhale 1 puff into the lungs daily.     No current facility-administered medications for this visit.    SURGICAL HISTORY:  Past Surgical History:  Procedure Laterality Date  . ABDOMINAL HYSTERECTOMY     partial  . AORTA - BILATERAL FEMORAL ARTERY BYPASS GRAFT Bilateral 06/14/2016   Procedure: AORTOBIFEMORAL BYPASS GRAFT AORTA SMA BYPASS GRAFT RE-IMPLANTATION  OF SUPERIOR  MESENTERIC ARTERY;  Surgeon: Nada Libman, MD;  Location: [facility] OR;  Service: Vascular;  Laterality: Bilateral;  . BACK SURGERY     L4/5  . CARDIAC CATHETERIZATION N/A 01/03/2016   Procedure: Right/Left Heart Cath and Coronary Angiography;  Surgeon: Dolores Patty, MD;  Location: [facility] INVASIVE CV LAB;  Service: Cardiovascular;  Laterality: N/A;  . COLONOSCOPY N/A 08/05/2015   Procedure: COLONOSCOPY;  Surgeon: Malissa Hippo, MD;  Location: [facility] ENDO SUITE;  Service: Endoscopy;  Laterality: N/A;  730  . ELBOW SURGERY    . IR IMAGING GUIDED PORT INSERTION  02/16/2020  . PERIPHERAL VASCULAR CATHETERIZATION N/A 02/16/2016   Procedure: Abdominal Aortogram w/Lower Extremity;  Surgeon: Iran Ouch, MD;  Location: [facility] INVASIVE CV LAB;  Service: Cardiovascular;  Laterality: N/A;    REVIEW OF SYSTEMS:  A comprehensive review of systems was negative except for: Constitutional: positive for fatigue Respiratory: positive for dyspnea on exertion   PHYSICAL EXAMINATION: General appearance: alert, cooperative, fatigued and no distress Head: Normocephalic, without obvious abnormality, atraumatic Neck: no adenopathy, no JVD, supple, symmetrical, trachea midline and thyroid not enlarged, symmetric, no tenderness/mass/nodules Lymph nodes: Cervical, supraclavicular, and axillary nodes normal. Resp: clear to auscultation bilaterally Back: symmetric, no curvature. ROM normal. No CVA tenderness. Cardio: regular rate and rhythm, S1, S2 normal, no murmur, click, rub or gallop GI: soft, non-tender; bowel sounds normal; no masses,  no organomegaly Extremities: extremities normal, atraumatic, no cyanosis or edema  ECOG PERFORMANCE STATUS: 1 - Symptomatic but completely ambulatory  Blood pressure 101/63, pulse (!) 111, temperature (!) 97 F (36.1 C), temperature source Tympanic, resp. rate 18, height 5\' 8"  (1.727 m), weight 141 lb (64 kg), SpO2 97 %.  LABORATORY DATA: Lab Results  Component Value Date   WBC 7.1  08/05/2020   HGB 10.6 (L) 08/05/2020   HCT 32.9 (L) 08/05/2020   MCV 100.6 (H) 08/05/2020   PLT 156 08/05/2020      Chemistry      Component Value Date/Time   NA 141 07/15/2020 1158   K 3.8 07/15/2020 1158   CL 110 07/15/2020 1158   CO2 23 07/15/2020 1158   BUN 50 (H) 07/15/2020 1158   CREATININE 2.17 (H) 07/15/2020 1158   CREATININE 0.90 02/08/2016 0949      Component Value Date/Time   CALCIUM 8.8 (L) 07/15/2020 1158   ALKPHOS 61 07/15/2020 1158   AST 19 07/15/2020 1158   ALT 15 07/15/2020 1158   BILITOT 0.3 07/15/2020 1158       RADIOGRAPHIC STUDIES: No results found.  ASSESSMENT AND PLAN: This is a very pleasant 75 years old white female with a stage IV (T3, N2, M1a) non-small cell lung cancer, poorly differentiated adenocarcinoma diagnosed in June 2021 and presented with large left upper lobe lung mass with mediastinal invasion in addition to mediastinal lymphadenopathy as well as bilateral pulmonary nodules. The patient has no actionable mutations but PD-L1 expression is 100%.  Because of the bulky disease I recommended for the patient to consider the combination chemotherapy.  She is interested in this option. She is currently being treated with carboplatin for AUC of 5, Alimta 500 mg/M2 and Keytruda 200 mg IV every 3 weeks status post 8 cycles.  Starting from cycle #5 she is on maintenance treatment with Alimta and Keytruda every 3 weeks.  The patient has been tolerating this treatment well with no concerning complaints. I recommended for her to proceed with cycle #9 today but only with single agent Keytruda because of the renal insufficiency. I will see her back for follow-up visit in 3 weeks for evaluation with repeat CT scan of the chest, abdomen pelvis without contrast for restaging of her disease. The patient was advised to call immediately if she has any concerning symptoms in the interval. The patient voices understanding of current disease status and treatment  options and is in agreement with the current care plan. All questions were answered. The patient knows to call the clinic with any problems, questions or concerns. We can certainly see the patient much sooner if necessary.  Disclaimer: This note was dictated with voice recognition software. Similar sounding words can inadvertently be transcribed and may not be corrected upon review.

## 2020-08-05 NOTE — Patient Instructions (Signed)
Steps to Quit Smoking Smoking tobacco is the leading cause of preventable death. It can affect almost every organ in the body. Smoking puts you and people around you at risk for many serious, long-lasting (chronic) diseases. Quitting smoking can be hard, but it is one of the best things that you can do for your health. It is never too late to quit. How do I get ready to quit? When you decide to quit smoking, make a plan to help you succeed. Before you quit:  Pick a date to quit. Set a date within the next 2 weeks to give you time to prepare.  Write down the reasons why you are quitting. Keep this list in places where you will see it often.  Tell your family, friends, and co-workers that you are quitting. Their support is important.  Talk with your doctor about the choices that may help you quit.  Find out if your health insurance will pay for these treatments.  Know the people, places, things, and activities that make you want to smoke (triggers). Avoid them. What first steps can I take to quit smoking?  Throw away all cigarettes at home, at work, and in your car.  Throw away the things that you use when you smoke, such as ashtrays and lighters.  Clean your car. Make sure to empty the ashtray.  Clean your home, including curtains and carpets. What can I do to help me quit smoking? Talk with your doctor about taking medicines and seeing a counselor at the same time. You are more likely to succeed when you do both.  If you are pregnant or breastfeeding, talk with your doctor about counseling or other ways to quit smoking. Do not take medicine to help you quit smoking unless your doctor tells you to do so. To quit smoking: Quit right away  Quit smoking totally, instead of slowly cutting back on how much you smoke over a period of time.  Go to counseling. You are more likely to quit if you go to counseling sessions regularly. Take medicine You may take medicines to help you quit. Some  medicines need a prescription, and some you can buy over-the-counter. Some medicines may contain a drug called nicotine to replace the nicotine in cigarettes. Medicines may:  Help you to stop having the desire to smoke (cravings).  Help to stop the problems that come when you stop smoking (withdrawal symptoms). Your doctor may ask you to use:  Nicotine patches, gum, or lozenges.  Nicotine inhalers or sprays.  Non-nicotine medicine that is taken by mouth. Find resources Find resources and other ways to help you quit smoking and remain smoke-free after you quit. These resources are most helpful when you use them often. They include:  Online chats with a counselor.  Phone quitlines.  Printed self-help materials.  Support groups or group counseling.  Text messaging programs.  Mobile phone apps. Use apps on your mobile phone or tablet that can help you stick to your quit plan. There are many free apps for mobile phones and tablets as well as websites. Examples include Quit Guide from the CDC and smokefree.gov   What things can I do to make it easier to quit?  Talk to your family and friends. Ask them to support and encourage you.  Call a phone quitline (1-800-QUIT-NOW), reach out to support groups, or work with a counselor.  Ask people who smoke to not smoke around you.  Avoid places that make you want to smoke,   such as: ? Bars. ? Parties. ? Smoke-break areas at work.  Spend time with people who do not smoke.  Lower the stress in your life. Stress can make you want to smoke. Try these things to help your stress: ? Getting regular exercise. ? Doing deep-breathing exercises. ? Doing yoga. ? Meditating. ? Doing a body scan. To do this, close your eyes, focus on one area of your body at a time from head to toe. Notice which parts of your body are tense. Try to relax the muscles in those areas.   How will I feel when I quit smoking? Day 1 to 3 weeks Within the first 24 hours,  you may start to have some problems that come from quitting tobacco. These problems are very bad 2-3 days after you quit, but they do not often last for more than 2-3 weeks. You may get these symptoms:  Mood swings.  Feeling restless, nervous, angry, or annoyed.  Trouble concentrating.  Dizziness.  Strong desire for high-sugar foods and nicotine.  Weight gain.  Trouble pooping (constipation).  Feeling like you may vomit (nausea).  Coughing or a sore throat.  Changes in how the medicines that you take for other issues work in your body.  Depression.  Trouble sleeping (insomnia). Week 3 and afterward After the first 2-3 weeks of quitting, you may start to notice more positive results, such as:  Better sense of smell and taste.  Less coughing and sore throat.  Slower heart rate.  Lower blood pressure.  Clearer skin.  Better breathing.  Fewer sick days. Quitting smoking can be hard. Do not give up if you fail the first time. Some people need to try a few times before they succeed. Do your best to stick to your quit plan, and talk with your doctor if you have any questions or concerns. Summary  Smoking tobacco is the leading cause of preventable death. Quitting smoking can be hard, but it is one of the best things that you can do for your health.  When you decide to quit smoking, make a plan to help you succeed.  Quit smoking right away, not slowly over a period of time.  When you start quitting, seek help from your doctor, family, or friends. This information is not intended to replace advice given to you by your health care provider. Make sure you discuss any questions you have with your health care provider. Document Revised: 03/28/2019 Document Reviewed: 09/21/2018 Elsevier Patient Education  2021 Elsevier Inc.  

## 2020-08-05 NOTE — Progress Notes (Signed)
Per Dr Nicanor Alcon it is okay to treat pt today with Alimta and Keytruda and heart rate of 111.

## 2020-08-05 NOTE — Progress Notes (Signed)
Pt discharged in no apparent distress. Pt left in her own wheelchair.  Pt aware of discharge instructions and verbalized understanding and had no further questions.

## 2020-08-10 ENCOUNTER — Telehealth: Payer: Self-pay | Admitting: Internal Medicine

## 2020-08-10 NOTE — Telephone Encounter (Signed)
Per 1/20 los, pt next appts already scheduled

## 2020-08-14 DIAGNOSIS — S99922A Unspecified injury of left foot, initial encounter: Secondary | ICD-10-CM | POA: Diagnosis not present

## 2020-08-14 DIAGNOSIS — I739 Peripheral vascular disease, unspecified: Secondary | ICD-10-CM | POA: Diagnosis not present

## 2020-08-14 DIAGNOSIS — E782 Mixed hyperlipidemia: Secondary | ICD-10-CM | POA: Diagnosis not present

## 2020-08-14 DIAGNOSIS — J441 Chronic obstructive pulmonary disease with (acute) exacerbation: Secondary | ICD-10-CM | POA: Diagnosis not present

## 2020-08-14 DIAGNOSIS — R7301 Impaired fasting glucose: Secondary | ICD-10-CM | POA: Diagnosis not present

## 2020-08-14 DIAGNOSIS — J449 Chronic obstructive pulmonary disease, unspecified: Secondary | ICD-10-CM | POA: Diagnosis not present

## 2020-08-14 DIAGNOSIS — Z720 Tobacco use: Secondary | ICD-10-CM | POA: Diagnosis not present

## 2020-08-14 DIAGNOSIS — K219 Gastro-esophageal reflux disease without esophagitis: Secondary | ICD-10-CM | POA: Diagnosis not present

## 2020-08-16 DIAGNOSIS — J449 Chronic obstructive pulmonary disease, unspecified: Secondary | ICD-10-CM | POA: Diagnosis not present

## 2020-08-24 ENCOUNTER — Ambulatory Visit (HOSPITAL_COMMUNITY)
Admission: RE | Admit: 2020-08-24 | Discharge: 2020-08-24 | Disposition: A | Discharge status: Home / Self Care | Payer: Medicare Other | Source: Ambulatory Visit | Attending: Internal Medicine | Admitting: Internal Medicine

## 2020-08-24 ENCOUNTER — Other Ambulatory Visit: Payer: Self-pay

## 2020-08-24 ENCOUNTER — Encounter (HOSPITAL_COMMUNITY): Payer: Self-pay

## 2020-08-24 DIAGNOSIS — C3412 Malignant neoplasm of upper lobe, left bronchus or lung: Secondary | ICD-10-CM | POA: Diagnosis not present

## 2020-08-24 DIAGNOSIS — M47816 Spondylosis without myelopathy or radiculopathy, lumbar region: Secondary | ICD-10-CM | POA: Diagnosis not present

## 2020-08-24 DIAGNOSIS — J439 Emphysema, unspecified: Secondary | ICD-10-CM | POA: Diagnosis not present

## 2020-08-24 DIAGNOSIS — I2510 Atherosclerotic heart disease of native coronary artery without angina pectoris: Secondary | ICD-10-CM | POA: Diagnosis not present

## 2020-08-24 DIAGNOSIS — J984 Other disorders of lung: Secondary | ICD-10-CM | POA: Diagnosis not present

## 2020-08-24 DIAGNOSIS — I700 Atherosclerosis of aorta: Secondary | ICD-10-CM | POA: Diagnosis not present

## 2020-08-24 DIAGNOSIS — I77810 Thoracic aortic ectasia: Secondary | ICD-10-CM | POA: Diagnosis not present

## 2020-08-24 DIAGNOSIS — C3490 Malignant neoplasm of unspecified part of unspecified bronchus or lung: Secondary | ICD-10-CM | POA: Diagnosis not present

## 2020-08-26 ENCOUNTER — Other Ambulatory Visit: Payer: Self-pay | Admitting: Medical Oncology

## 2020-08-26 ENCOUNTER — Inpatient Hospital Stay: Payer: Medicare Other

## 2020-08-26 ENCOUNTER — Other Ambulatory Visit: Payer: Self-pay

## 2020-08-26 ENCOUNTER — Inpatient Hospital Stay: Payer: Medicare Other | Attending: Internal Medicine

## 2020-08-26 ENCOUNTER — Inpatient Hospital Stay: Payer: Medicare Other | Admitting: Internal Medicine

## 2020-08-26 VITALS — BP 99/73 | HR 101 | Temp 97.3°F | Resp 20 | Wt 138.8 lb

## 2020-08-26 DIAGNOSIS — Z79899 Other long term (current) drug therapy: Secondary | ICD-10-CM | POA: Diagnosis not present

## 2020-08-26 DIAGNOSIS — C3412 Malignant neoplasm of upper lobe, left bronchus or lung: Secondary | ICD-10-CM | POA: Insufficient documentation

## 2020-08-26 DIAGNOSIS — Z5112 Encounter for antineoplastic immunotherapy: Secondary | ICD-10-CM | POA: Diagnosis not present

## 2020-08-26 DIAGNOSIS — C3492 Malignant neoplasm of unspecified part of left bronchus or lung: Secondary | ICD-10-CM

## 2020-08-26 DIAGNOSIS — Z95828 Presence of other vascular implants and grafts: Secondary | ICD-10-CM

## 2020-08-26 LAB — CBC WITH DIFFERENTIAL (CANCER CENTER ONLY)
Abs Immature Granulocytes: 0.05 10*3/uL (ref 0.00–0.07)
Basophils Absolute: 0 10*3/uL (ref 0.0–0.1)
Basophils Relative: 1 %
Eosinophils Absolute: 0.1 10*3/uL (ref 0.0–0.5)
Eosinophils Relative: 1 %
HCT: 34.5 % — ABNORMAL LOW (ref 36.0–46.0)
Hemoglobin: 11.2 g/dL — ABNORMAL LOW (ref 12.0–15.0)
Immature Granulocytes: 1 %
Lymphocytes Relative: 17 %
Lymphs Abs: 1.4 10*3/uL (ref 0.7–4.0)
MCH: 32.4 pg (ref 26.0–34.0)
MCHC: 32.5 g/dL (ref 30.0–36.0)
MCV: 99.7 fL (ref 80.0–100.0)
Monocytes Absolute: 1 10*3/uL (ref 0.1–1.0)
Monocytes Relative: 12 %
Neutro Abs: 6 10*3/uL (ref 1.7–7.7)
Neutrophils Relative %: 68 %
Platelet Count: 187 10*3/uL (ref 150–400)
RBC: 3.46 MIL/uL — ABNORMAL LOW (ref 3.87–5.11)
RDW: 13.6 % (ref 11.5–15.5)
WBC Count: 8.6 10*3/uL (ref 4.0–10.5)
nRBC: 0 % (ref 0.0–0.2)

## 2020-08-26 LAB — TSH: TSH: 0.888 u[IU]/mL (ref 0.308–3.960)

## 2020-08-26 LAB — CMP (CANCER CENTER ONLY)
ALT: 8 U/L (ref 0–44)
AST: 15 U/L (ref 15–41)
Albumin: 3.7 g/dL (ref 3.5–5.0)
Alkaline Phosphatase: 71 U/L (ref 38–126)
Anion gap: 11 (ref 5–15)
BUN: 36 mg/dL — ABNORMAL HIGH (ref 8–23)
CO2: 24 mmol/L (ref 22–32)
Calcium: 8.9 mg/dL (ref 8.9–10.3)
Chloride: 107 mmol/L (ref 98–111)
Creatinine: 1.85 mg/dL — ABNORMAL HIGH (ref 0.44–1.00)
GFR, Estimated: 28 mL/min — ABNORMAL LOW (ref >60)
Glucose, Bld: 91 mg/dL (ref 70–99)
Potassium: 3.8 mmol/L (ref 3.5–5.1)
Sodium: 142 mmol/L (ref 135–145)
Total Bilirubin: 0.3 mg/dL (ref 0.3–1.2)
Total Protein: 7.3 g/dL (ref 6.5–8.1)

## 2020-08-26 MED ORDER — SODIUM CHLORIDE 0.9% FLUSH
10.0000 mL | INTRAVENOUS | Status: DC | PRN
  Administered 2020-08-26: 10 mL
  Filled 2020-08-26: qty 10

## 2020-08-26 MED ORDER — SODIUM CHLORIDE 0.9 % IV SOLN
200.0000 mg | Freq: Once | INTRAVENOUS | Status: AC
  Administered 2020-08-26: 200 mg via INTRAVENOUS
  Filled 2020-08-26: qty 8

## 2020-08-26 MED ORDER — SODIUM CHLORIDE 0.9 % IV SOLN
Freq: Once | INTRAVENOUS | Status: AC
  Filled 2020-08-26: qty 250

## 2020-08-26 MED ORDER — PROCHLORPERAZINE MALEATE 10 MG PO TABS
ORAL_TABLET | ORAL | Status: AC
  Filled 2020-08-26: qty 1

## 2020-08-26 MED ORDER — SODIUM CHLORIDE 0.9% FLUSH
10.0000 mL | INTRAVENOUS | Status: AC | PRN
  Filled 2020-08-26: qty 10

## 2020-08-26 MED ORDER — HEPARIN SOD (PORK) LOCK FLUSH 100 UNIT/ML IV SOLN
500.0000 [IU] | Freq: Once | INTRAVENOUS | Status: AC | PRN
  Administered 2020-08-26: 500 [IU]
  Filled 2020-08-26: qty 5

## 2020-08-26 MED ORDER — CYANOCOBALAMIN 1000 MCG/ML IJ SOLN: 1000.0000 ug | Freq: Once | INTRAMUSCULAR | Status: DC

## 2020-08-26 MED ORDER — PROCHLORPERAZINE MALEATE 10 MG PO TABS
10.0000 mg | ORAL_TABLET | Freq: Once | ORAL | Status: AC
Start: 2020-08-26 — End: 2020-08-26
  Administered 2020-08-26: 10 mg via ORAL

## 2020-08-26 MED ORDER — SODIUM CHLORIDE 0.9 % IV SOLN: 400.0000 mg/m2 | Freq: Once | INTRAVENOUS | Status: DC

## 2020-08-26 NOTE — Progress Notes (Signed)
Pt only to get [place] today.  Alimta d/c'd per Dr Julien Nordmann

## 2020-08-26 NOTE — Patient Instructions (Addendum)
[facility] Discharge Instructions for Patients Receiving Immunotherapy  Today you received the following immunotherapy agents Pembrolizumab(Keytruda)  To help prevent nausea and vomiting after your treatment, we encourage you to take your nausea medication as directed.   If you develop nausea and vomiting that is not controlled by your nausea medication, call the clinic.   BELOW ARE SYMPTOMS THAT SHOULD BE REPORTED IMMEDIATELY:  *FEVER GREATER THAN 100.5 F  *CHILLS WITH OR WITHOUT FEVER  NAUSEA AND VOMITING THAT IS NOT CONTROLLED WITH YOUR NAUSEA MEDICATION  *UNUSUAL SHORTNESS OF BREATH  *UNUSUAL BRUISING OR BLEEDING  TENDERNESS IN MOUTH AND THROAT WITH OR WITHOUT PRESENCE OF ULCERS  *URINARY PROBLEMS  *BOWEL PROBLEMS  UNUSUAL RASH Items with * indicate a potential emergency and should be followed up as soon as possible.  Feel free to call the clinic should you have any questions or concerns. The clinic phone number is [phone].  Please show the [facility] at check-in to the Emergency Department and triage nurse.

## 2020-08-26 NOTE — Progress Notes (Signed)
Per Dr Julien Nordmann ,it is okay to treat pt today with Keytruda and heart rate of 101.

## 2020-08-26 NOTE — Progress Notes (Signed)
MD states okay to treat with Creat 1.85.

## 2020-08-26 NOTE — Progress Notes (Signed)
[facility] [phone]   Fax:([phone]) [phone]  OFFICE PROGRESS NOTE  Celene Squibb, MD [address] [place] [address]  DIAGNOSIS: Stage IV (T3, N2, M1a) non-small cell lung cancer, poorly differentiated adenocarcinoma presented with large left upper lobe lung mass in addition to mediastinal lymphadenopathy and bilateral pulmonary nodules diagnosed in June 2021.  Biomarker Findings Tumor Mutational Burden - 10 Muts/Mb Microsatellite status - MS-Stable Genomic Findings For a complete list of the genes assayed, please refer to the Appendix. NF1 Q2582* KRAS G12V CTNNB1 A66fs*10 TP53 H179N 7 Disease relevant genes with no reportable alterations: ALK, BRAF, EGFR, ERBB2, MET, RET, ROS1  PDL1 Expression: 100%  PRIOR THERAPY: Palliative radiotherapy to the large left upper lobe lung mass.  CURRENT THERAPY: Systemic chemotherapy with carboplatin for AUC of 5, Alimta 500 mg/M2 and Keytruda 200 mg IV every 3 weeks.  First dose 02/12/2020.  Status post 9 cycles.  Starting from cycle #5 she was on maintenance treatment with Alimta and Keytruda every 3 weeks.  Starting cycle #9 she will be on single agent Keytruda every 3 weeks.  INTERVAL HISTORY: Kayla Sawyer 75 y.o. female returns to the clinic today for follow-up visit accompanied by her husband.  The patient is feeling fine today with no concerning complaints except for the baseline shortness of breath and she is on home oxygen.  She also has mild fatigue.  She denied having any chest pain, cough or hemoptysis.  She has no nausea, vomiting, diarrhea or constipation.  She has no headache or visual changes.  She has no fever or chills.  She has been tolerating her treatment with Keytruda fairly well.  The patient had repeat CT scan of the chest, abdomen pelvis performed recently and she is here for evaluation and discussion of her risk her results before starting cycle #10.   MEDICAL HISTORY: Past Medical  History:  Diagnosis Date  . Arthritis   . COPD (chronic obstructive pulmonary disease) ([place])   . Depression   . E. coli pyelonephritis 6//2014  . GERD (gastroesophageal reflux disease)   . Pneumonia   . Pulmonary hypertension ([place])   . Sepsis ([place])     ALLERGIES:  is allergic to elemental sulfur.  MEDICATIONS:  Current Outpatient Medications  Medication Sig Dispense Refill  . acetaminophen (TYLENOL) 500 MG tablet Take 500 mg by mouth every 6 (six) hours as needed for mild pain.    Marland Kitchen albuterol (PROVENTIL) (2.5 MG/3ML) 0.083% nebulizer solution Take 3 mLs (2.5 mg total) by nebulization every 6 (six) hours as needed for wheezing or shortness of breath. 75 mL 12  . aspirin EC 81 MG tablet Take 1 tablet (81 mg total) by mouth daily.    Marland Kitchen atorvastatin (LIPITOR) 40 MG tablet Take 40 mg by mouth daily.    . cephALEXin (KEFLEX) 500 MG capsule Take 500 mg by mouth 2 (two) times daily.    . folic acid (FOLVITE) 1 MG tablet Take 1 tablet (1 mg total) by mouth daily. 30 tablet 4  . levocetirizine (XYZAL) 5 MG tablet Take 5 mg by mouth every evening.     . lidocaine-prilocaine (EMLA) cream Apply to the Port-A-Cath site 30-60-minute before chemotherapy every 3 weeks. 30 g 0  . Multiple Vitamin (MULTIVITAMIN WITH MINERALS) TABS tablet Take 1 tablet by mouth daily.    . mupirocin ointment (BACTROBAN) 2 % SMARTSIG:1 Application Topical 2-3 Times Daily    . omeprazole (PRILOSEC) 20 MG  capsule Take 20 mg by mouth daily.     . prochlorperazine (COMPAZINE) 10 MG tablet Take 1 tablet (10 mg total) by mouth every 6 (six) hours as needed for nausea or vomiting. 30 tablet 0  . sertraline (ZOLOFT) 100 MG tablet Take 100 mg by mouth daily.     . TRELEGY ELLIPTA 100-62.5-25 MCG/INH AEPB Inhale 1 puff into the lungs daily.     No current facility-administered medications for this visit.   Facility-Administered Medications Ordered in Other Visits  Medication Dose Route Frequency Provider Last Rate Last Admin  .  sodium chloride flush (NS) 0.9 % injection 10 mL  10 mL Intravenous PRN Curt Bears, MD        SURGICAL HISTORY:  Past Surgical History:  Procedure Laterality Date  . ABDOMINAL HYSTERECTOMY     partial  . AORTA - BILATERAL FEMORAL ARTERY BYPASS GRAFT Bilateral 06/14/2016   Procedure: AORTOBIFEMORAL BYPASS GRAFT AORTA SMA BYPASS GRAFT RE-IMPLANTATION  OF SUPERIOR MESENTERIC ARTERY;  Surgeon: Serafina Mitchell, MD;  Location: [facility];  Service: Vascular;  Laterality: Bilateral;  . BACK SURGERY     L4/5  . CARDIAC CATHETERIZATION N/A 01/03/2016   Procedure: Right/Left Heart Cath and Coronary Angiography;  Surgeon: Jolaine Artist, MD;  Location: [facility];  Service: Cardiovascular;  Laterality: N/A;  . COLONOSCOPY N/A 08/05/2015   Procedure: COLONOSCOPY;  Surgeon: Rogene Houston, MD;  [facility];  Service: Endoscopy;  Laterality: N/A;  730  . ELBOW SURGERY    . IR IMAGING GUIDED PORT INSERTION  02/16/2020  . PERIPHERAL VASCULAR CATHETERIZATION N/A 02/16/2016   Procedure: Abdominal Aortogram w/Lower Extremity;  Surgeon: Wellington Hampshire, MD;  Location: [facility];  Service: Cardiovascular;  Laterality: N/A;    REVIEW OF SYSTEMS:  Constitutional: positive for fatigue Eyes: negative Ears, nose, mouth, throat, and face: negative Respiratory: positive for dyspnea on exertion Cardiovascular: negative Gastrointestinal: negative Genitourinary:negative Integument/breast: negative Hematologic/lymphatic: negative Musculoskeletal:negative Neurological: negative Behavioral/Psych: negative Endocrine: negative Allergic/Immunologic: negative   PHYSICAL EXAMINATION: General appearance: alert, cooperative, fatigued and no distress Head: Normocephalic, without obvious abnormality, atraumatic Neck: no adenopathy, no JVD, supple, symmetrical, trachea midline and thyroid not enlarged, symmetric, no tenderness/mass/nodules Lymph nodes: Cervical, supraclavicular, and  axillary nodes normal. Resp: clear to auscultation bilaterally Back: symmetric, no curvature. ROM normal. No CVA tenderness. Cardio: regular rate and rhythm, S1, S2 normal, no murmur, click, rub or gallop GI: soft, non-tender; bowel sounds normal; no masses,  no organomegaly Extremities: extremities normal, atraumatic, no cyanosis or edema  ECOG PERFORMANCE STATUS: 1 - Symptomatic but completely ambulatory  Blood pressure 99/73, pulse (!) 101, temperature (!) 97.3 F (36.3 C), temperature source Oral, resp. rate 20, weight 138 lb 12.8 oz (63 kg), SpO2 96 %.  LABORATORY DATA: Lab Results  Component Value Date   WBC 7.1 08/05/2020   HGB 10.6 (L) 08/05/2020   HCT 32.9 (L) 08/05/2020   MCV 100.6 (H) 08/05/2020   PLT 156 08/05/2020      Chemistry      Component Value Date/Time   NA 139 08/05/2020 1110   K 3.9 08/05/2020 1110   CL 106 08/05/2020 1110   CO2 24 08/05/2020 1110   BUN 46 (H) 08/05/2020 1110   CREATININE 1.92 (H) 08/05/2020 1110   CREATININE 0.90 02/08/2016 0949      Component Value Date/Time   CALCIUM 8.7 (L) 08/05/2020 1110   ALKPHOS 69 08/05/2020 1110   AST 16 08/05/2020 1110  ALT 10 08/05/2020 1110   BILITOT 0.3 08/05/2020 1110       RADIOGRAPHIC STUDIES: CT Abdomen Pelvis Wo Contrast  Result Date: 08/25/2020 CLINICAL DATA:  Primary Cancer Type: Lung Imaging Indication: Assess response to therapy Interval therapy since last imaging? Yes Initial Cancer Diagnosis Date: 01/12/2020; Established by: Biopsy-proven Detailed Pathology: Stage IV non-small cell lung cancer, poorly differentiated adenocarcinoma. Primary Tumor location: Left upper lobe. Surgeries: Aortobifemoral bypass graft.  Hysterectomy. Chemotherapy: Yes; Ongoing? No; Most recent administration: 07/15/2020 Immunotherapy?  Yes; Type: Keytruda; Ongoing? Yes Radiation therapy? Yes; Date Range: 02/19/2020 - 03/09/2020; Target: Left lung EXAM: CT CHEST, ABDOMEN AND PELVIS WITHOUT CONTRAST TECHNIQUE:  Multidetector CT imaging of the chest, abdomen and pelvis was performed following the standard protocol without IV contrast. COMPARISON:  Most recent CT chest, abdomen and pelvis 06/22/2020. 01/02/2020 PET-CT. FINDINGS: CT CHEST FINDINGS Cardiovascular: The heart is normal in size. Stable small pericardial effusion. Stable tortuosity, mild ectasia and advanced atherosclerotic calcifications involving the thoracic aorta. Stable three-vessel coronary artery calcifications. Mediastinum/Nodes: No mediastinal or hilar mass or lymphadenopathy identified without contrast. The esophagus is grossly normal. Lungs/Pleura: Stable severe underlying emphysematous changes and pulmonary scarring. Stable dense radiation changes involving the left upper lobe. New extensive interstitial thickening in the left upper lobe. I doubt this is radiation change unless the patient has had more radiation since the prior CT scan. This could be a superimposed pneumonitis or possibly development of interstitial spread of tumor. A short-term follow-up chest CT is recommended and recommend correlation with any new clinical findings such as cough or fever ex cetera. I do not see any discrete nodularity. 5 x 4 mm sub solid nodule in the medial aspect of the right lower lobe on image number 119/4 previously measured 9 x 5 mm. No new pulmonary nodules. No pleural effusions or pleural lesions. Musculoskeletal: No breast masses, supraclavicular or axillary adenopathy. The right-sided Port-A-Cath is stable. The thyroid gland is unremarkable. The bony thorax is intact.  No worrisome bone lesions. CT ABDOMEN PELVIS FINDINGS Hepatobiliary: No hepatic lesions are identified without contrast. The gallbladder is unremarkable. No biliary dilatation. Pancreas: No mass, inflammation or ductal dilatation. Spleen: Normal size.  No focal lesions. Adrenals/Urinary Tract: No adrenal gland lesions are identified. The kidneys are unremarkable. The bladder is unremarkable.  Stomach/Bowel: The stomach, duodenum, small bowel and colon are unremarkable. No acute inflammatory changes, mass lesions or obstructive findings. Vascular/Lymphatic: Advanced atherosclerotic calcifications involving the upper abdominal aorta and branch vessels with probable high-grade stenosis of celiac axis. Aortobifemoral graft is noted. No mesenteric or retroperitoneal mass or adenopathy. Reproductive: Surgically absent. Other: No pelvic mass or adenopathy. No free pelvic fluid collections. No inguinal mass or adenopathy. No abdominal wall hernia or subcutaneous lesions. Musculoskeletal: No significant bony findings. Stable advanced degenerative changes involving the lumbar spine. IMPRESSION: 1. Stable dense radiation changes involving the left upper lobe. 2. New extensive interstitial thickening in the left upper lobe could be a superimposed pneumonitis or possibly development of interstitial spread of tumor. A short-term follow-up chest CT is recommended in 3 months and recommend correlation with any new clinical findings such as cough or fever ex cetera. 3. Stable severe emphysematous changes and pulmonary scarring. 4. Interval decrease in size of the sub solid nodule in the medial aspect of the right lower lobe. No new pulmonary nodules. 5. No findings for abdominal/pelvic metastatic disease without contrast. 6. Stable advanced atherosclerotic calcifications involving the thoracic and abdominal aorta and branch vessels including the coronary arteries. Aortobifemoral graft  in place. 7. Emphysema and aortic atherosclerosis. Aortic Atherosclerosis (ICD10-I70.0) and Emphysema (ICD10-J43.9). Electronically Signed   By: Marijo Sanes M.D.   On: 08/25/2020 10:17   CT Chest Wo Contrast  Result Date: 08/25/2020 CLINICAL DATA:  Primary Cancer Type: Lung Imaging Indication: Assess response to therapy Interval therapy since last imaging? Yes Initial Cancer Diagnosis Date: 01/12/2020; Established by: Biopsy-proven  Detailed Pathology: Stage IV non-small cell lung cancer, poorly differentiated adenocarcinoma. Primary Tumor location: Left upper lobe. Surgeries: Aortobifemoral bypass graft.  Hysterectomy. Chemotherapy: Yes; Ongoing? No; Most recent administration: 07/15/2020 Immunotherapy?  Yes; Type: Keytruda; Ongoing? Yes Radiation therapy? Yes; Date Range: 02/19/2020 - 03/09/2020; Target: Left lung EXAM: CT CHEST, ABDOMEN AND PELVIS WITHOUT CONTRAST TECHNIQUE: Multidetector CT imaging of the chest, abdomen and pelvis was performed following the standard protocol without IV contrast. COMPARISON:  Most recent CT chest, abdomen and pelvis 06/22/2020. 01/02/2020 PET-CT. FINDINGS: CT CHEST FINDINGS Cardiovascular: The heart is normal in size. Stable small pericardial effusion. Stable tortuosity, mild ectasia and advanced atherosclerotic calcifications involving the thoracic aorta. Stable three-vessel coronary artery calcifications. Mediastinum/Nodes: No mediastinal or hilar mass or lymphadenopathy identified without contrast. The esophagus is grossly normal. Lungs/Pleura: Stable severe underlying emphysematous changes and pulmonary scarring. Stable dense radiation changes involving the left upper lobe. New extensive interstitial thickening in the left upper lobe. I doubt this is radiation change unless the patient has had more radiation since the prior CT scan. This could be a superimposed pneumonitis or possibly development of interstitial spread of tumor. A short-term follow-up chest CT is recommended and recommend correlation with any new clinical findings such as cough or fever ex cetera. I do not see any discrete nodularity. 5 x 4 mm sub solid nodule in the medial aspect of the right lower lobe on image number 119/4 previously measured 9 x 5 mm. No new pulmonary nodules. No pleural effusions or pleural lesions. Musculoskeletal: No breast masses, supraclavicular or axillary adenopathy. The right-sided Port-A-Cath is stable. The  thyroid gland is unremarkable. The bony thorax is intact.  No worrisome bone lesions. CT ABDOMEN PELVIS FINDINGS Hepatobiliary: No hepatic lesions are identified without contrast. The gallbladder is unremarkable. No biliary dilatation. Pancreas: No mass, inflammation or ductal dilatation. Spleen: Normal size.  No focal lesions. Adrenals/Urinary Tract: No adrenal gland lesions are identified. The kidneys are unremarkable. The bladder is unremarkable. Stomach/Bowel: The stomach, duodenum, small bowel and colon are unremarkable. No acute inflammatory changes, mass lesions or obstructive findings. Vascular/Lymphatic: Advanced atherosclerotic calcifications involving the upper abdominal aorta and branch vessels with probable high-grade stenosis of celiac axis. Aortobifemoral graft is noted. No mesenteric or retroperitoneal mass or adenopathy. Reproductive: Surgically absent. Other: No pelvic mass or adenopathy. No free pelvic fluid collections. No inguinal mass or adenopathy. No abdominal wall hernia or subcutaneous lesions. Musculoskeletal: No significant bony findings. Stable advanced degenerative changes involving the lumbar spine. IMPRESSION: 1. Stable dense radiation changes involving the left upper lobe. 2. New extensive interstitial thickening in the left upper lobe could be a superimposed pneumonitis or possibly development of interstitial spread of tumor. A short-term follow-up chest CT is recommended in 3 months and recommend correlation with any new clinical findings such as cough or fever ex cetera. 3. Stable severe emphysematous changes and pulmonary scarring. 4. Interval decrease in size of the sub solid nodule in the medial aspect of the right lower lobe. No new pulmonary nodules. 5. No findings for abdominal/pelvic metastatic disease without contrast. 6. Stable advanced atherosclerotic calcifications involving the thoracic and abdominal  aorta and branch vessels including the coronary arteries.  Aortobifemoral graft in place. 7. Emphysema and aortic atherosclerosis. Aortic Atherosclerosis (ICD10-I70.0) and Emphysema (ICD10-J43.9). Electronically Signed   By: Marijo Sanes M.D.   On: 08/25/2020 10:17    ASSESSMENT AND PLAN: This is a very pleasant 75 years old white female with a stage IV (T3, N2, M1a) non-small cell lung cancer, poorly differentiated adenocarcinoma diagnosed in June 2021 and presented with large left upper lobe lung mass with mediastinal invasion in addition to mediastinal lymphadenopathy as well as bilateral pulmonary nodules. The patient has no actionable mutations but PD-L1 expression is 100%. Because of the bulky disease I recommended for the patient to consider the combination chemotherapy.  She is interested in this option. She is currently being treated with carboplatin for AUC of 5, Alimta 500 mg/M2 and Keytruda 200 mg IV every 3 weeks status post 9 cycles.  Starting from cycle #5 she is on maintenance treatment with Alimta and Keytruda every 3 weeks.  Starting from cycle #9 she is on treatment with single agent Keytruda. The patient has been tolerating this treatment well with no concerning adverse effects. She had repeat CT scan of the chest, abdomen pelvis performed recently.  I personally and independently reviewed the scan images and discussed the results with the patient and her husband. Her scan showed no concerning findings for disease progression but there was new extensive interstitial thickening in the left upper lobe could be suspicious for pneumonitis and close follow-up imaging studies was recommended. I recommended for the patient to continue her current treatment and she will proceed with cycle #10 of single agent Keytruda today. I will see the patient back for follow-up visit in 3 weeks for evaluation before the next cycle of her treatment. She was advised to call immediately if she has any concerning symptoms in the interval. The patient voices  understanding of current disease status and treatment options and is in agreement with the current care plan. All questions were answered. The patient knows to call the clinic with any problems, questions or concerns. We can certainly see the patient much sooner if necessary.  Disclaimer: This note was dictated with voice recognition software. Similar sounding words can inadvertently be transcribed and may not be corrected upon review.

## 2020-08-30 ENCOUNTER — Telehealth: Payer: Self-pay | Admitting: Internal Medicine

## 2020-08-30 NOTE — Telephone Encounter (Signed)
Per 2/10 los, next appts already scheduled

## 2020-08-31 ENCOUNTER — Encounter: Payer: Self-pay | Admitting: Pulmonary Disease

## 2020-08-31 ENCOUNTER — Other Ambulatory Visit: Payer: Self-pay

## 2020-08-31 ENCOUNTER — Ambulatory Visit: Payer: Medicare Other | Admitting: Pulmonary Disease

## 2020-08-31 DIAGNOSIS — C3492 Malignant neoplasm of unspecified part of left bronchus or lung: Secondary | ICD-10-CM | POA: Diagnosis not present

## 2020-08-31 DIAGNOSIS — J432 Centrilobular emphysema: Secondary | ICD-10-CM

## 2020-08-31 DIAGNOSIS — J9611 Chronic respiratory failure with hypoxia: Secondary | ICD-10-CM | POA: Diagnosis not present

## 2020-08-31 NOTE — Assessment & Plan Note (Addendum)
She has surprisingly tolerated her cycles of chemotherapy and is now transitioning to maintenance regimen with Keytruda alone. I reviewed CT scan which shows scarring in left upper lobe which is to be expected after radiation and chemo but lung mass has completely decreased in size and I am very pleased with this response 26-month follow-up CT is planned

## 2020-08-31 NOTE — Assessment & Plan Note (Signed)
Continue 3 L pulse oxygen 24 hours

## 2020-08-31 NOTE — Progress Notes (Signed)
   Subjective:    Patient ID: Kayla Sawyer, female    DOB: 05-04-1946, 75 y.o.   MRN: [id number]  HPI  75 year old smoker for follow-up of COPD, lung cancer and chronic hypoxic respiratory failure Left upper lobe lung mass, hypermetabolic with scattered bilateral nodules and hilar and mediastinal lymphadenopathy >>  Stage IV (T3, N2,M1a)non-small cell lung cancer, poorly differentiated adenocarcinoma , PDL1 Expression: 100%  S/p palliative radiotherapy Systemic chemotherapy with carboplatin , Alimta and Keytruda  every 3 weeks first dose 01/2020 s/p 9 cycles  Chief Complaint  Patient presents with  . Follow-up    Shortness of breath when walking from one end of the house to the other, not a lot of energy   She has completed 9 cycles of chemotherapy Continues to be short of breath, ambulates with walker, on 3L pulse oxygen 24 hours Compliant with Trelegy  Reviewed CT chest/ad/pelvis 08/24/20 Stable dense radiation changes involving the left upper lobe. 2. New extensive interstitial thickening in the left upper lobe could be a superimposed pneumonitis or possibly development of interstitial spread of tumor  Significant tests/ events reviewed  PFTs 11/2015 showed ratio 52 with FEV1 53%, FVC 77%, paradoxical drop with bronhodilator, TLC 99% & DLCO 29%  HRCT 07/2016 RUL nodule regressed, no ILD,moderate centrilobular andparaseptal emphysema  5/2021she did not desaturate on walking on 3 L pulse oxygen but her heart rate went up from 86-1 37  Review of Systems neg for any significant sore throat, dysphagia, itching, sneezing, nasal congestion or excess/ purulent secretions, fever, chills, sweats, unintended wt loss, pleuritic or exertional cp, hempoptysis, orthopnea pnd or change in chronic leg swelling. Also denies presyncope, palpitations, heartburn, abdominal pain, nausea, vomiting, diarrhea or change in bowel or urinary habits, dysuria,hematuria, rash, arthralgias, visual  complaints, headache, numbness weakness or ataxia.     Objective:   Physical Exam  Gen. Pleasant, thin, elderly, in no distress ENT - no thrush, no pallor/icterus,no post nasal drip Neck: No JVD, no thyromegaly, no carotid bruits Lungs: no use of accessory muscles, no dullness to percussion, clear without rales or rhonchi  Cardiovascular: Rhythm regular, heart sounds  normal, no murmurs or gallops, no peripheral edema Musculoskeletal: No deformities, no cyanosis or clubbing , ambulates with walker       Assessment & Plan:

## 2020-08-31 NOTE — Patient Instructions (Signed)
Stay on trelegy CT scan looks good !

## 2020-08-31 NOTE — Assessment & Plan Note (Signed)
Continue Trelegy.

## 2020-09-06 ENCOUNTER — Telehealth: Payer: Self-pay | Admitting: Medical Oncology

## 2020-09-06 NOTE — Telephone Encounter (Signed)
Rash extending to legs-" I now have 1 spot above R thigh and 13 spots on right calf . Reports she is miserable with itching, burning and dryness.She finished 1 tube of hydrocortisone as recommended for her hands.-It does not provide adequate relief from symptoms. Last keytruda 02/10.

## 2020-09-06 NOTE — Telephone Encounter (Signed)
We can start her on Medrol Dosepak and if no improvement we may have to use the high-dose prednisone taper.  Thank you.

## 2020-09-07 ENCOUNTER — Other Ambulatory Visit: Payer: Self-pay

## 2020-09-07 ENCOUNTER — Telehealth: Payer: Self-pay

## 2020-09-07 DIAGNOSIS — C3492 Malignant neoplasm of unspecified part of left bronchus or lung: Secondary | ICD-10-CM

## 2020-09-07 MED ORDER — METHYLPREDNISOLONE 4 MG PO TBPK: ORAL_TABLET | ORAL | 0 refills | Status: DC

## 2020-09-07 NOTE — Telephone Encounter (Signed)
Pt called for an update regarding her rash. I advised the pt per previous telephone encounter that a rx for Medrol has been sent to her pharmacy. Pt expressed understanding of this information.

## 2020-09-09 DIAGNOSIS — S99922A Unspecified injury of left foot, initial encounter: Secondary | ICD-10-CM | POA: Diagnosis not present

## 2020-09-09 DIAGNOSIS — F17218 Nicotine dependence, cigarettes, with other nicotine-induced disorders: Secondary | ICD-10-CM | POA: Diagnosis not present

## 2020-09-09 DIAGNOSIS — E782 Mixed hyperlipidemia: Secondary | ICD-10-CM | POA: Diagnosis not present

## 2020-09-09 DIAGNOSIS — L989 Disorder of the skin and subcutaneous tissue, unspecified: Secondary | ICD-10-CM | POA: Diagnosis not present

## 2020-09-09 DIAGNOSIS — J441 Chronic obstructive pulmonary disease with (acute) exacerbation: Secondary | ICD-10-CM | POA: Diagnosis not present

## 2020-09-09 DIAGNOSIS — C3492 Malignant neoplasm of unspecified part of left bronchus or lung: Secondary | ICD-10-CM | POA: Diagnosis not present

## 2020-09-09 DIAGNOSIS — R7303 Prediabetes: Secondary | ICD-10-CM | POA: Diagnosis not present

## 2020-09-13 DIAGNOSIS — K219 Gastro-esophageal reflux disease without esophagitis: Secondary | ICD-10-CM | POA: Diagnosis not present

## 2020-09-13 DIAGNOSIS — E782 Mixed hyperlipidemia: Secondary | ICD-10-CM | POA: Diagnosis not present

## 2020-09-13 DIAGNOSIS — R7301 Impaired fasting glucose: Secondary | ICD-10-CM | POA: Diagnosis not present

## 2020-09-13 DIAGNOSIS — Z720 Tobacco use: Secondary | ICD-10-CM | POA: Diagnosis not present

## 2020-09-13 DIAGNOSIS — S99922A Unspecified injury of left foot, initial encounter: Secondary | ICD-10-CM | POA: Diagnosis not present

## 2020-09-13 DIAGNOSIS — I739 Peripheral vascular disease, unspecified: Secondary | ICD-10-CM | POA: Diagnosis not present

## 2020-09-13 DIAGNOSIS — J441 Chronic obstructive pulmonary disease with (acute) exacerbation: Secondary | ICD-10-CM | POA: Diagnosis not present

## 2020-09-13 DIAGNOSIS — J449 Chronic obstructive pulmonary disease, unspecified: Secondary | ICD-10-CM | POA: Diagnosis not present

## 2020-09-14 DIAGNOSIS — I739 Peripheral vascular disease, unspecified: Secondary | ICD-10-CM | POA: Diagnosis not present

## 2020-09-14 DIAGNOSIS — K219 Gastro-esophageal reflux disease without esophagitis: Secondary | ICD-10-CM | POA: Diagnosis not present

## 2020-09-14 DIAGNOSIS — N1831 Chronic kidney disease, stage 3a: Secondary | ICD-10-CM | POA: Diagnosis not present

## 2020-09-14 DIAGNOSIS — L989 Disorder of the skin and subcutaneous tissue, unspecified: Secondary | ICD-10-CM | POA: Diagnosis not present

## 2020-09-14 DIAGNOSIS — J441 Chronic obstructive pulmonary disease with (acute) exacerbation: Secondary | ICD-10-CM | POA: Diagnosis not present

## 2020-09-14 DIAGNOSIS — E782 Mixed hyperlipidemia: Secondary | ICD-10-CM | POA: Diagnosis not present

## 2020-09-14 DIAGNOSIS — C3492 Malignant neoplasm of unspecified part of left bronchus or lung: Secondary | ICD-10-CM | POA: Diagnosis not present

## 2020-09-14 DIAGNOSIS — R7303 Prediabetes: Secondary | ICD-10-CM | POA: Diagnosis not present

## 2020-09-14 DIAGNOSIS — D6481 Anemia due to antineoplastic chemotherapy: Secondary | ICD-10-CM | POA: Diagnosis not present

## 2020-09-14 DIAGNOSIS — J9611 Chronic respiratory failure with hypoxia: Secondary | ICD-10-CM | POA: Diagnosis not present

## 2020-09-16 ENCOUNTER — Inpatient Hospital Stay: Payer: Medicare Other

## 2020-09-16 ENCOUNTER — Inpatient Hospital Stay: Payer: Medicare Other | Attending: Internal Medicine | Admitting: Internal Medicine

## 2020-09-16 ENCOUNTER — Encounter: Payer: Self-pay | Admitting: Internal Medicine

## 2020-09-16 ENCOUNTER — Other Ambulatory Visit: Payer: Self-pay

## 2020-09-16 VITALS — BP 86/63 | HR 102 | Temp 97.7°F | Resp 16 | Ht 68.0 in | Wt 137.0 lb

## 2020-09-16 DIAGNOSIS — Z452 Encounter for adjustment and management of vascular access device: Secondary | ICD-10-CM | POA: Insufficient documentation

## 2020-09-16 DIAGNOSIS — Z79899 Other long term (current) drug therapy: Secondary | ICD-10-CM | POA: Insufficient documentation

## 2020-09-16 DIAGNOSIS — C3492 Malignant neoplasm of unspecified part of left bronchus or lung: Secondary | ICD-10-CM

## 2020-09-16 DIAGNOSIS — Z95828 Presence of other vascular implants and grafts: Secondary | ICD-10-CM

## 2020-09-16 DIAGNOSIS — Z5112 Encounter for antineoplastic immunotherapy: Secondary | ICD-10-CM

## 2020-09-16 DIAGNOSIS — C3412 Malignant neoplasm of upper lobe, left bronchus or lung: Secondary | ICD-10-CM | POA: Insufficient documentation

## 2020-09-16 DIAGNOSIS — R21 Rash and other nonspecific skin eruption: Secondary | ICD-10-CM | POA: Insufficient documentation

## 2020-09-16 LAB — CMP (CANCER CENTER ONLY)
ALT: 8 U/L (ref 0–44)
AST: 12 U/L — ABNORMAL LOW (ref 15–41)
Albumin: 3.3 g/dL — ABNORMAL LOW (ref 3.5–5.0)
Alkaline Phosphatase: 68 U/L (ref 38–126)
Anion gap: 10 (ref 5–15)
BUN: 40 mg/dL — ABNORMAL HIGH (ref 8–23)
CO2: 23 mmol/L (ref 22–32)
Calcium: 8.9 mg/dL (ref 8.9–10.3)
Chloride: 105 mmol/L (ref 98–111)
Creatinine: 1.8 mg/dL — ABNORMAL HIGH (ref 0.44–1.00)
GFR, Estimated: 29 mL/min — ABNORMAL LOW (ref >60)
Glucose, Bld: 93 mg/dL (ref 70–99)
Potassium: 4 mmol/L (ref 3.5–5.1)
Sodium: 138 mmol/L (ref 135–145)
Total Bilirubin: 0.4 mg/dL (ref 0.3–1.2)
Total Protein: 6.9 g/dL (ref 6.5–8.1)

## 2020-09-16 LAB — CBC WITH DIFFERENTIAL (CANCER CENTER ONLY)
Abs Immature Granulocytes: 0.04 10*3/uL (ref 0.00–0.07)
Basophils Absolute: 0 10*3/uL (ref 0.0–0.1)
Basophils Relative: 0 %
Eosinophils Absolute: 0 10*3/uL (ref 0.0–0.5)
Eosinophils Relative: 0 %
HCT: 33.4 % — ABNORMAL LOW (ref 36.0–46.0)
Hemoglobin: 10.8 g/dL — ABNORMAL LOW (ref 12.0–15.0)
Immature Granulocytes: 1 %
Lymphocytes Relative: 14 %
Lymphs Abs: 1 10*3/uL (ref 0.7–4.0)
MCH: 31.8 pg (ref 26.0–34.0)
MCHC: 32.3 g/dL (ref 30.0–36.0)
MCV: 98.2 fL (ref 80.0–100.0)
Monocytes Absolute: 1 10*3/uL (ref 0.1–1.0)
Monocytes Relative: 13 %
Neutro Abs: 5.4 10*3/uL (ref 1.7–7.7)
Neutrophils Relative %: 72 %
Platelet Count: 158 10*3/uL (ref 150–400)
RBC: 3.4 MIL/uL — ABNORMAL LOW (ref 3.87–5.11)
RDW: 13.2 % (ref 11.5–15.5)
WBC Count: 7.5 10*3/uL (ref 4.0–10.5)
nRBC: 0 % (ref 0.0–0.2)

## 2020-09-16 LAB — TSH: TSH: 0.631 u[IU]/mL (ref 0.308–3.960)

## 2020-09-16 MED ORDER — PREDNISONE 10 MG PO TABS: ORAL_TABLET | ORAL | 0 refills | Status: DC

## 2020-09-16 MED ORDER — SODIUM CHLORIDE 0.9% FLUSH
10.0000 mL | Freq: Once | INTRAVENOUS | Status: AC
  Administered 2020-09-16: 10 mL
  Filled 2020-09-16: qty 10

## 2020-09-16 MED ORDER — HEPARIN SOD (PORK) LOCK FLUSH 100 UNIT/ML IV SOLN
500.0000 [IU] | Freq: Once | INTRAVENOUS | Status: AC
  Administered 2020-09-16: 500 [IU]
  Filled 2020-09-16: qty 5

## 2020-09-16 NOTE — Progress Notes (Signed)
[place] [phone]   [phone]  OFFICE PROGRESS NOTE  Celene Squibb, MD [address] [place] [address]  DIAGNOSIS: Stage IV (T3, N2, M1a) non-small cell lung cancer, poorly differentiated adenocarcinoma presented with large left upper lobe lung mass in addition to mediastinal lymphadenopathy and bilateral pulmonary nodules diagnosed in June 2021.  Biomarker Findings Tumor Mutational Burden - 10 Muts/Mb Microsatellite status - MS-Stable Genomic Findings For a complete list of the genes assayed, please refer to the Appendix. NF1 Q2582* KRAS G12V CTNNB1 A674fs*10 TP53 H179N 7 Disease relevant genes with no reportable alterations: ALK, BRAF, EGFR, ERBB2, MET, RET, ROS1  PDL1 Expression: 100%  PRIOR THERAPY: Palliative radiotherapy to the large left upper lobe lung mass.  CURRENT THERAPY: Systemic chemotherapy with carboplatin for AUC of 5, Alimta 500 mg/M2 and Keytruda 200 mg IV every 3 weeks.  First dose 02/12/2020.  Status post 10 cycles.  Starting from cycle #5 she was on maintenance treatment with Alimta and Keytruda every 3 weeks.  Starting cycle #9 she will be on single agent Keytruda every 3 weeks.  INTERVAL HISTORY: Kayla Sawyer 75 y.o. female returns to the clinic today for follow-up visit accompanied by her husband.  The patient is complaining of significant skin rash in the lower and upper extremities.  She was tried on Medrol Dosepak with improvement of the rash but no resolution.  She continues to have itching.  She denied having any current chest pain, shortness of breath, cough or hemoptysis.  She denied having any nausea, vomiting, diarrhea or constipation.  She denied having any headache or visual changes.  She was supposed to start cycle #11 of her treatment today but she is concerned about the significant skin rash.   MEDICAL HISTORY: Past Medical History:  Diagnosis Date  . Arthritis   . COPD (chronic  obstructive pulmonary disease) ([place])   . Depression   . E. coli pyelonephritis 6//2014  . GERD (gastroesophageal reflux disease)   . Pneumonia   . Pulmonary hypertension ([place])   . Sepsis ([place])     ALLERGIES:  is allergic to elemental sulfur.  MEDICATIONS:  Current Outpatient Medications  Medication Sig Dispense Refill  . acetaminophen (TYLENOL) 500 MG tablet Take 500 mg by mouth every 6 (six) hours as needed for mild pain.    Marland Kitchen albuterol (PROVENTIL) (2.5 MG/3ML) 0.083% nebulizer solution Take 3 mLs (2.5 mg total) by nebulization every 6 (six) hours as needed for wheezing or shortness of breath. 75 mL 12  . aspirin EC 81 MG tablet Take 1 tablet (81 mg total) by mouth daily.    Marland Kitchen atorvastatin (LIPITOR) 40 MG tablet Take 40 mg by mouth daily.    . folic acid (FOLVITE) 1 MG tablet Take 1 tablet (1 mg total) by mouth daily. 30 tablet 4  . levocetirizine (XYZAL) 5 MG tablet Take 5 mg by mouth every evening.     . lidocaine-prilocaine (EMLA) cream Apply to the Port-A-Cath site 30-60-minute before chemotherapy every 3 weeks. 30 g 0  . methylPREDNISolone (MEDROL DOSEPAK) 4 MG TBPK tablet Take per package instructions 21 tablet 0  . Multiple Vitamin (MULTIVITAMIN WITH MINERALS) TABS tablet Take 1 tablet by mouth daily.    . mupirocin ointment (BACTROBAN) 2 % SMARTSIG:1 Application Topical 2-3 Times Daily    . omeprazole (PRILOSEC) 20 MG capsule Take 20 mg by mouth daily.     . prochlorperazine (COMPAZINE) 10 MG tablet Take  1 tablet (10 mg total) by mouth every 6 (six) hours as needed for nausea or vomiting. 30 tablet 0  . sertraline (ZOLOFT) 100 MG tablet Take 100 mg by mouth daily.     . TRELEGY ELLIPTA 100-62.5-25 MCG/INH AEPB Inhale 1 puff into the lungs daily.     No current facility-administered medications for this visit.   Facility-Administered Medications Ordered in Other Visits  Medication Dose Route Frequency Provider Last Rate Last Admin  . sodium chloride flush (NS) 0.9 % injection  10 mL  10 mL Intravenous PRN Curt Bears, MD        SURGICAL HISTORY:  Past Surgical History:  Procedure Laterality Date  . ABDOMINAL HYSTERECTOMY     partial  . AORTA - BILATERAL FEMORAL ARTERY BYPASS GRAFT Bilateral 06/14/2016   Procedure: AORTOBIFEMORAL BYPASS GRAFT AORTA SMA BYPASS GRAFT RE-IMPLANTATION  OF SUPERIOR MESENTERIC ARTERY;  Surgeon: Serafina Mitchell, MD;  Location: [facility];  Service: Vascular;  Laterality: Bilateral;  . BACK SURGERY     L4/5  . CARDIAC CATHETERIZATION N/A 01/03/2016   Procedure: Right/Left Heart Cath and Coronary Angiography;  Surgeon: Jolaine Artist, MD;  Location: [facility];  Service: Cardiovascular;  Laterality: N/A;  . COLONOSCOPY N/A 08/05/2015   Procedure: COLONOSCOPY;  Surgeon: Rogene Houston, MD;  [facility];  Service: Endoscopy;  Laterality: N/A;  730  . ELBOW SURGERY    . IR IMAGING GUIDED PORT INSERTION  02/16/2020  . PERIPHERAL VASCULAR CATHETERIZATION N/A 02/16/2016   Procedure: Abdominal Aortogram w/Lower Extremity;  Surgeon: Wellington Hampshire, MD;  Location: [facility];  Service: Cardiovascular;  Laterality: N/A;    REVIEW OF SYSTEMS:  Constitutional: positive for fatigue Eyes: negative Ears, nose, mouth, throat, and face: negative Respiratory: positive for dyspnea on exertion Cardiovascular: negative Gastrointestinal: negative Genitourinary:negative Integument/breast: positive for pruritus and rash Hematologic/lymphatic: negative Musculoskeletal:negative Neurological: negative Behavioral/Psych: negative Endocrine: negative Allergic/Immunologic: negative   PHYSICAL EXAMINATION: General appearance: alert, cooperative, fatigued and no distress Head: Normocephalic, without obvious abnormality, atraumatic Neck: no adenopathy, no JVD, supple, symmetrical, trachea midline and thyroid not enlarged, symmetric, no tenderness/mass/nodules Lymph nodes: Cervical, supraclavicular, and axillary nodes  normal. Resp: clear to auscultation bilaterally Back: symmetric, no curvature. ROM normal. No CVA tenderness. Cardio: regular rate and rhythm, S1, S2 normal, no murmur, click, rub or gallop GI: soft, non-tender; bowel sounds normal; no masses,  no organomegaly Extremities: extremities normal, atraumatic, no cyanosis or edema Neurologic: Alert and oriented X 3, normal strength and tone. Normal symmetric reflexes. Normal coordination and gait  Skin examination: Multiple macular skin rash on the upper and lower extremities.  ECOG PERFORMANCE STATUS: 1 - Symptomatic but completely ambulatory  Blood pressure (!) 86/63, pulse (!) 102, temperature 97.7 F (36.5 C), temperature source Tympanic, resp. rate 16, height $RemoveBe'5\' 8"'toTldOipX$  (1.727 m), weight 137 lb (62.1 kg), SpO2 99 %.  LABORATORY DATA: Lab Results  Component Value Date   WBC 8.6 08/26/2020   HGB 11.2 (L) 08/26/2020   HCT 34.5 (L) 08/26/2020   MCV 99.7 08/26/2020   PLT 187 08/26/2020      Chemistry      Component Value Date/Time   NA 142 08/26/2020 1245   K 3.8 08/26/2020 1245   CL 107 08/26/2020 1245   CO2 24 08/26/2020 1245   BUN 36 (H) 08/26/2020 1245   CREATININE 1.85 (H) 08/26/2020 1245   CREATININE 0.90 02/08/2016 0949      Component Value Date/Time   CALCIUM  8.9 08/26/2020 1245   ALKPHOS 71 08/26/2020 1245   AST 15 08/26/2020 1245   ALT 8 08/26/2020 1245   BILITOT 0.3 08/26/2020 1245       RADIOGRAPHIC STUDIES: CT Abdomen Pelvis Wo Contrast  Result Date: 08/25/2020 CLINICAL DATA:  Primary Cancer Type: Lung Imaging Indication: Assess response to therapy Interval therapy since last imaging? Yes Initial Cancer Diagnosis Date: 01/12/2020; Established by: Biopsy-proven Detailed Pathology: Stage IV non-small cell lung cancer, poorly differentiated adenocarcinoma. Primary Tumor location: Left upper lobe. Surgeries: Aortobifemoral bypass graft.  Hysterectomy. Chemotherapy: Yes; Ongoing? No; Most recent administration: 07/15/2020  Immunotherapy?  Yes; Type: Keytruda; Ongoing? Yes Radiation therapy? Yes; Date Range: 02/19/2020 - 03/09/2020; Target: Left lung EXAM: CT CHEST, ABDOMEN AND PELVIS WITHOUT CONTRAST TECHNIQUE: Multidetector CT imaging of the chest, abdomen and pelvis was performed following the standard protocol without IV contrast. COMPARISON:  Most recent CT chest, abdomen and pelvis 06/22/2020. 01/02/2020 PET-CT. FINDINGS: CT CHEST FINDINGS Cardiovascular: The heart is normal in size. Stable small pericardial effusion. Stable tortuosity, mild ectasia and advanced atherosclerotic calcifications involving the thoracic aorta. Stable three-vessel coronary artery calcifications. Mediastinum/Nodes: No mediastinal or hilar mass or lymphadenopathy identified without contrast. The esophagus is grossly normal. Lungs/Pleura: Stable severe underlying emphysematous changes and pulmonary scarring. Stable dense radiation changes involving the left upper lobe. New extensive interstitial thickening in the left upper lobe. I doubt this is radiation change unless the patient has had more radiation since the prior CT scan. This could be a superimposed pneumonitis or possibly development of interstitial spread of tumor. A short-term follow-up chest CT is recommended and recommend correlation with any new clinical findings such as cough or fever ex cetera. I do not see any discrete nodularity. 5 x 4 mm sub solid nodule in the medial aspect of the right lower lobe on image number 119/4 previously measured 9 x 5 mm. No new pulmonary nodules. No pleural effusions or pleural lesions. Musculoskeletal: No breast masses, supraclavicular or axillary adenopathy. The right-sided Port-A-Cath is stable. The thyroid gland is unremarkable. The bony thorax is intact.  No worrisome bone lesions. CT ABDOMEN PELVIS FINDINGS Hepatobiliary: No hepatic lesions are identified without contrast. The gallbladder is unremarkable. No biliary dilatation. Pancreas: No mass,  inflammation or ductal dilatation. Spleen: Normal size.  No focal lesions. Adrenals/Urinary Tract: No adrenal gland lesions are identified. The kidneys are unremarkable. The bladder is unremarkable. Stomach/Bowel: The stomach, duodenum, small bowel and colon are unremarkable. No acute inflammatory changes, mass lesions or obstructive findings. Vascular/Lymphatic: Advanced atherosclerotic calcifications involving the upper abdominal aorta and branch vessels with probable high-grade stenosis of celiac axis. Aortobifemoral graft is noted. No mesenteric or retroperitoneal mass or adenopathy. Reproductive: Surgically absent. Other: No pelvic mass or adenopathy. No free pelvic fluid collections. No inguinal mass or adenopathy. No abdominal wall hernia or subcutaneous lesions. Musculoskeletal: No significant bony findings. Stable advanced degenerative changes involving the lumbar spine. IMPRESSION: 1. Stable dense radiation changes involving the left upper lobe. 2. New extensive interstitial thickening in the left upper lobe could be a superimposed pneumonitis or possibly development of interstitial spread of tumor. A short-term follow-up chest CT is recommended in 3 months and recommend correlation with any new clinical findings such as cough or fever ex cetera. 3. Stable severe emphysematous changes and pulmonary scarring. 4. Interval decrease in size of the sub solid nodule in the medial aspect of the right lower lobe. No new pulmonary nodules. 5. No findings for abdominal/pelvic metastatic disease without contrast. 6. Stable advanced  atherosclerotic calcifications involving the thoracic and abdominal aorta and branch vessels including the coronary arteries. Aortobifemoral graft in place. 7. Emphysema and aortic atherosclerosis. Aortic Atherosclerosis (ICD10-I70.0) and Emphysema (ICD10-J43.9). Electronically Signed   By: Marijo Sanes M.D.   On: 08/25/2020 10:17   CT Chest Wo Contrast  Result Date:  08/25/2020 CLINICAL DATA:  Primary Cancer Type: Lung Imaging Indication: Assess response to therapy Interval therapy since last imaging? Yes Initial Cancer Diagnosis Date: 01/12/2020; Established by: Biopsy-proven Detailed Pathology: Stage IV non-small cell lung cancer, poorly differentiated adenocarcinoma. Primary Tumor location: Left upper lobe. Surgeries: Aortobifemoral bypass graft.  Hysterectomy. Chemotherapy: Yes; Ongoing? No; Most recent administration: 07/15/2020 Immunotherapy?  Yes; Type: Keytruda; Ongoing? Yes Radiation therapy? Yes; Date Range: 02/19/2020 - 03/09/2020; Target: Left lung EXAM: CT CHEST, ABDOMEN AND PELVIS WITHOUT CONTRAST TECHNIQUE: Multidetector CT imaging of the chest, abdomen and pelvis was performed following the standard protocol without IV contrast. COMPARISON:  Most recent CT chest, abdomen and pelvis 06/22/2020. 01/02/2020 PET-CT. FINDINGS: CT CHEST FINDINGS Cardiovascular: The heart is normal in size. Stable small pericardial effusion. Stable tortuosity, mild ectasia and advanced atherosclerotic calcifications involving the thoracic aorta. Stable three-vessel coronary artery calcifications. Mediastinum/Nodes: No mediastinal or hilar mass or lymphadenopathy identified without contrast. The esophagus is grossly normal. Lungs/Pleura: Stable severe underlying emphysematous changes and pulmonary scarring. Stable dense radiation changes involving the left upper lobe. New extensive interstitial thickening in the left upper lobe. I doubt this is radiation change unless the patient has had more radiation since the prior CT scan. This could be a superimposed pneumonitis or possibly development of interstitial spread of tumor. A short-term follow-up chest CT is recommended and recommend correlation with any new clinical findings such as cough or fever ex cetera. I do not see any discrete nodularity. 5 x 4 mm sub solid nodule in the medial aspect of the right lower lobe on image number 119/4  previously measured 9 x 5 mm. No new pulmonary nodules. No pleural effusions or pleural lesions. Musculoskeletal: No breast masses, supraclavicular or axillary adenopathy. The right-sided Port-A-Cath is stable. The thyroid gland is unremarkable. The bony thorax is intact.  No worrisome bone lesions. CT ABDOMEN PELVIS FINDINGS Hepatobiliary: No hepatic lesions are identified without contrast. The gallbladder is unremarkable. No biliary dilatation. Pancreas: No mass, inflammation or ductal dilatation. Spleen: Normal size.  No focal lesions. Adrenals/Urinary Tract: No adrenal gland lesions are identified. The kidneys are unremarkable. The bladder is unremarkable. Stomach/Bowel: The stomach, duodenum, small bowel and colon are unremarkable. No acute inflammatory changes, mass lesions or obstructive findings. Vascular/Lymphatic: Advanced atherosclerotic calcifications involving the upper abdominal aorta and branch vessels with probable high-grade stenosis of celiac axis. Aortobifemoral graft is noted. No mesenteric or retroperitoneal mass or adenopathy. Reproductive: Surgically absent. Other: No pelvic mass or adenopathy. No free pelvic fluid collections. No inguinal mass or adenopathy. No abdominal wall hernia or subcutaneous lesions. Musculoskeletal: No significant bony findings. Stable advanced degenerative changes involving the lumbar spine. IMPRESSION: 1. Stable dense radiation changes involving the left upper lobe. 2. New extensive interstitial thickening in the left upper lobe could be a superimposed pneumonitis or possibly development of interstitial spread of tumor. A short-term follow-up chest CT is recommended in 3 months and recommend correlation with any new clinical findings such as cough or fever ex cetera. 3. Stable severe emphysematous changes and pulmonary scarring. 4. Interval decrease in size of the sub solid nodule in the medial aspect of the right lower lobe. No new pulmonary nodules. 5. No  findings  for abdominal/pelvic metastatic disease without contrast. 6. Stable advanced atherosclerotic calcifications involving the thoracic and abdominal aorta and branch vessels including the coronary arteries. Aortobifemoral graft in place. 7. Emphysema and aortic atherosclerosis. Aortic Atherosclerosis (ICD10-I70.0) and Emphysema (ICD10-J43.9). Electronically Signed   By: Marijo Sanes M.D.   On: 08/25/2020 10:17    ASSESSMENT AND PLAN: This is a very pleasant 75 years old white female with a stage IV (T3, N2, M1a) non-small cell lung cancer, poorly differentiated adenocarcinoma diagnosed in June 2021 and presented with large left upper lobe lung mass with mediastinal invasion in addition to mediastinal lymphadenopathy as well as bilateral pulmonary nodules. The patient has no actionable mutations but PD-L1 expression is 100%. Because of the bulky disease I recommended for the patient to consider the combination chemotherapy.  She is interested in this option. She is currently being treated with carboplatin for AUC of 5, Alimta 500 mg/M2 and Keytruda 200 mg IV every 3 weeks status post 10 cycles.  Starting from cycle #5 she is on maintenance treatment with Alimta and Keytruda every 3 weeks.  Starting from cycle #9 she is on treatment with single agent Keytruda. The patient has been tolerating this treatment well with no concerning adverse effects except for the macular and extensive skin rash on the upper and lower extremities.  I had a lengthy discussion with the patient and her husband today about her condition and treatment options.  I gave the patient the option of continuing her treatment with single agent Keytruda and close monitoring of the skin rash and treatment with local steroid versus holding her treatment for now and treating her with high-dose prednisone.  The patient is concerned about the skin rash and she would like to have stronger treatment for her condition. I will hold her treatment for today.   I will start the patient on a tapered dose of prednisone 1 mg/KG daily that will be tapered over the next 4 weeks. I will delay the start of cycle number 11 by 4 weeks until improvement of her rash. The patient was advised to call immediately if she has any concerning symptoms in the interval. The patient voices understanding of current disease status and treatment options and is in agreement with the current care plan. All questions were answered. The patient knows to call the clinic with any problems, questions or concerns. We can certainly see the patient much sooner if necessary.  Disclaimer: This note was dictated with voice recognition software. Similar sounding words can inadvertently be transcribed and may not be corrected upon review.

## 2020-10-11 NOTE — Progress Notes (Signed)
[facility] OFFICE PROGRESS NOTE  Celene Squibb, MD [address] [place] [address]  DIAGNOSIS: Stage IV (T3, N2,M1a)non-small cell lung cancer, poorly differentiated adenocarcinoma presented with large left upper lobe lung mass in addition to mediastinal lymphadenopathy and bilateral pulmonary nodules diagnosed in June 2021.  Biomarker Findings Tumor Mutational Burden - 10 Muts/Mb Microsatellite status - MS-Stable Genomic Findings For a complete list of the genes assayed, please refer to the Appendix. NF1 Q2582* KRAS G12V CTNNB1 A671f*10 TP53 H179N 7 Disease relevant genes with no reportable alterations: ALK, BRAF, EGFR, ERBB2, MET, RET, ROS1  PDL1 Expression: 100%  PRIOR THERAPY: Palliative radiotherapy to the large left upper lobe lung mass  CURRENT THERAPY: Systemic chemotherapy with carboplatin for AUC of 5, Alimta400 mg/M2 and Keytruda 200 mg IV every 3 weeks. First dose 02/12/2020. Status post10cycles.Starting from cycle #5, she will be on maintenance alimta and [place] was discontinued due to renal insufficiency.   INTERVAL HISTORY: Kayla ASKARI75y.o. female returns to clinic today for a follow-up visit accompanied by her husband. Her last appointment was on 09/16/2020, at that appointment, the patient had developed a significant skin rash which was likely immunotherapy mediated.  She was first given a Medrol Dosepak with some improvement in her symptoms but not complete resolution.  She was then started on a high-dose prednisone taper which she will complete tomorrow. Her rash has improved but she still has some discoloration on her legs. The rash on her hands improved significantly. She also notes improvement with the itching and burning.   Otherwise, the patient is feeling fairly well today. Because she has been on high dose steroids the last few weeks, she is reporting that she has felt well since being off treatment. She has more  energy and her appetite has improved. She is thinking of discontinuing treatment and maximizing her time with her family while she feels well. She denies any fever, chills, or night sweats. Her weight is stable. She reports her baseline dyspnea on exertion for which she is currently on 3 L of home oxygen via nasal cannula.  She reports her mild baseline productive cough.  She denies any chest pain or palpitations.  She denies any nausea, vomiting, diarrhea, or constipation.  She denies any usual headache or visual changes.  The patient is here today for reevaluation and consideration of resuming her treatment.       MEDICAL HISTORY: Past Medical History:  Diagnosis Date  . Arthritis   . COPD (chronic obstructive pulmonary disease) ([place]   . Depression   . E. coli pyelonephritis 6//2014  . GERD (gastroesophageal reflux disease)   . Pneumonia   . Pulmonary hypertension ([facility]   . Sepsis ([facility]     ALLERGIES:  is allergic to elemental sulfur.  MEDICATIONS:  Current Outpatient Medications  Medication Sig Dispense Refill  . acetaminophen (TYLENOL) 500 MG tablet Take 500 mg by mouth every 6 (six) hours as needed for mild pain.    .Marland Kitchenalbuterol (PROVENTIL) (2.5 MG/3ML) 0.083% nebulizer solution Take 3 mLs (2.5 mg total) by nebulization every 6 (six) hours as needed for wheezing or shortness of breath. 75 mL 12  . aspirin EC 81 MG tablet Take 1 tablet (81 mg total) by mouth daily.    .Marland Kitchenatorvastatin (LIPITOR) 40 MG tablet Take 40 mg by mouth daily.    . folic acid (FOLVITE) 1 MG tablet Take 1 tablet (1 mg total) by mouth daily. 30 tablet 4  .  levocetirizine (XYZAL) 5 MG tablet Take 5 mg by mouth every evening.     . lidocaine-prilocaine (EMLA) cream Apply to the Port-A-Cath site 30-60-minute before chemotherapy every 3 weeks. 30 g 0  . methylPREDNISolone (MEDROL DOSEPAK) 4 MG TBPK tablet Take per package instructions 21 tablet 0  . Multiple Vitamin (MULTIVITAMIN WITH MINERALS) TABS tablet Take 1  tablet by mouth daily.    . mupirocin ointment (BACTROBAN) 2 % SMARTSIG:1 Application Topical 2-3 Times Daily    . omeprazole (PRILOSEC) 20 MG capsule Take 20 mg by mouth daily.     . predniSONE (DELTASONE) 10 MG tablet 6 tablet p.o. daily for 1 week followed by 4 tablet p.o. daily for 1 week followed by 2 tablet p.o. daily for 1 week followed by 1 tablet p.o. daily for 1 week 91 tablet 0  . prochlorperazine (COMPAZINE) 10 MG tablet Take 1 tablet (10 mg total) by mouth every 6 (six) hours as needed for nausea or vomiting. 30 tablet 0  . sertraline (ZOLOFT) 100 MG tablet Take 100 mg by mouth daily.     . TRELEGY ELLIPTA 100-62.5-25 MCG/INH AEPB Inhale 1 puff into the lungs daily.     Current Facility-Administered Medications  Medication Dose Route Frequency Provider Last Rate Last Admin  . sodium chloride flush (NS) 0.9 % injection 10 mL  10 mL Intravenous PRN Churchill Grimsley L, PA-C   10 mL at 10/13/20 1040   Facility-Administered Medications Ordered in Other Visits  Medication Dose Route Frequency Provider Last Rate Last Admin  . sodium chloride flush (NS) 0.9 % injection 10 mL  10 mL Intravenous PRN Curt Bears, MD        SURGICAL HISTORY:  Past Surgical History:  Procedure Laterality Date  . ABDOMINAL HYSTERECTOMY     partial  . AORTA - BILATERAL FEMORAL ARTERY BYPASS GRAFT Bilateral 06/14/2016   Procedure: AORTOBIFEMORAL BYPASS GRAFT AORTA SMA BYPASS GRAFT RE-IMPLANTATION  OF SUPERIOR MESENTERIC ARTERY;  Surgeon: Serafina Mitchell, MD;  Location: [facility];  Service: Vascular;  Laterality: Bilateral;  . BACK SURGERY     L4/5  . CARDIAC CATHETERIZATION N/A 01/03/2016   Procedure: Right/Left Heart Cath and Coronary Angiography;  Surgeon: Jolaine Artist, MD;  Location: [facility];  Service: Cardiovascular;  Laterality: N/A;  . COLONOSCOPY N/A 08/05/2015   Procedure: COLONOSCOPY;  Surgeon: Rogene Houston, MD;  [facility];  Service: Endoscopy;  Laterality:  N/A;  730  . ELBOW SURGERY    . IR IMAGING GUIDED PORT INSERTION  02/16/2020  . PERIPHERAL VASCULAR CATHETERIZATION N/A 02/16/2016   Procedure: Abdominal Aortogram w/Lower Extremity;  Surgeon: Wellington Hampshire, MD;  Location: [facility];  Service: Cardiovascular;  Laterality: N/A;    REVIEW OF SYSTEMS:   Review of Systems  Constitutional: Negative for appetite change, chills, fatigue, fever and unexpected weight change.  HENT: Negative for mouth sores, nosebleeds, sore throat and trouble swallowing.   Eyes: Negative for eye problems and icterus.  Respiratory:Positive for mild dyspnea on exertion and a mild cough.   Cardiovascular: Negative for chest pain and leg swelling.  Gastrointestinal: Negative for abdominal pain, constipation, diarrhea, nausea and vomiting.  Genitourinary: Negative for bladder incontinence, difficulty urinating, dysuria, frequency and hematuria.   Musculoskeletal: Negative for back pain, gait problem, neck pain and neck stiffness.  Skin: Negative for itching and rash.  Neurological: Negative for dizziness, extremity weakness, gait problem, headaches, light-headedness and seizures.  Hematological: Negative for adenopathy. Does not bruise/bleed  easily.  Psychiatric/Behavioral: Negative for confusion, depression and sleep disturbance. The patient is not nervous/anxious.     PHYSICAL EXAMINATION:  Blood pressure 133/65, pulse 100, temperature 97.6 F (36.4 C), temperature source Tympanic, resp. rate 19, height 5' 8" (1.727 m), weight 139 lb 11.2 oz (63.4 kg), SpO2 94 %.  ECOG PERFORMANCE STATUS: 2 - Symptomatic, <50% confined to bed  Physical Exam  Constitutional: Oriented to person, place, and time andelderly appearing femaleand in no distress.  HENT:  Head: Normocephalic and atraumatic.  Mouth/Throat: Oropharynx is clear and moist. No oropharyngeal exudate.  Eyes: Conjunctivae are normal. Right eye exhibits no discharge. Left eye exhibits no discharge. No  scleral icterus.  Neck: Normal range of motion. Neck supple.  Cardiovascular:Normal rate, regular rhythm, normal heart sounds and intact distal pulses.  Pulmonary/Chest: Effort normal. Quiet breath sounds in all lung fields with some rhonchi bilatearlly.No respiratory distress.On 3 L via nasal cannula. Abdominal: Soft. Bowel sounds are normal. Exhibits no distension and no mass. There is no tenderness.  Musculoskeletal: Normal range of motion. Exhibits no edema.  Lymphadenopathy:  No cervical adenopathy.  Neurological: Alert and oriented to person, place, and time. Exhibits muscle wasting the patient was examined in the wheelchair. Skin: Skin is warm and dry. No rash noted. Not diaphoretic. No erythema. No pallor.  Psychiatric: Mood, memory and judgment normal.  Vitals reviewed.   LABORATORY DATA: Lab Results  Component Value Date   WBC 7.6 10/13/2020   HGB 11.5 (L) 10/13/2020   HCT 37.1 10/13/2020   MCV 99.2 10/13/2020   PLT 133 (L) 10/13/2020      Chemistry      Component Value Date/Time   NA 143 10/13/2020 0936   K 3.7 10/13/2020 0936   CL 105 10/13/2020 0936   CO2 26 10/13/2020 0936   BUN 28 (H) 10/13/2020 0936   CREATININE 1.24 (H) 10/13/2020 0936   CREATININE 0.90 02/08/2016 0949      Component Value Date/Time   CALCIUM 8.1 (L) 10/13/2020 0936   ALKPHOS 63 10/13/2020 0936   AST 14 (L) 10/13/2020 0936   ALT 13 10/13/2020 0936   BILITOT 0.4 10/13/2020 0936       RADIOGRAPHIC STUDIES:  No results found.   ASSESSMENT/PLAN:  This is a very pleasant 29 year oldCaucasianfemale with a stage IV (T3, N2, M1a) non-small cell lung cancer, poorly differentiated adenocarcinoma diagnosed in June 2021 and presented with large left upper lobe lung mass with mediastinal invasion in addition to mediastinal lymphadenopathy as well as bilateral pulmonary nodules. Her PDL1 expression is 100% and she has no actionable mutations.   She completed palliative radiotherapy the  lung due to her bulky disease.  She iscurrently undergoing palliative systemic chemotherapy with carboplatin for an AUC of 5, Alimta400 mg per metered squared, Keytruda 200 mg IV every 3 weeks. She is status post10cycles. Startingfrom today, cycle #5, the patient was on maintenance Alimta and Keytruda.  Alimta was ultimately discontinued due to renal insufficiency.  The patient's Beryle Flock has been on hold due to a significant immunotherapy mediated skin rash.  The patient skin rash has improved at this time.  The patient was seen with Dr. Julien Nordmann today.  Dr. Julien Nordmann gave the patient the option of resuming treatment with Keytruda versus continuing on observation. However, he discussed that may cause disease progression. If she needed to resume treatment in the future, the other treatment options are chemotherapy which may cause more fatigue that her immunotherapy. He also discussed she likely felt  well due to her steroid use. The patient understands the risks but would ike to maximize her time with her family while she feels well. She would ike to discontinue treatment.   We will arrange for a repeat CT scan of the chest, abdomen, and pelvis without contrast in 2 months. We will see her a few days later to review the results. I have ordered her scan without contrast due to her CKD.   I will arrange for flush appointments every 6-8 weeks.   The patient was advised to call immediately if she has any concerning symptoms in the interval. The patient voices understanding of current disease status and treatment options and is in agreement with the current care plan. All questions were answered. The patient knows to call the clinic with any problems, questions or concerns. We can certainly see the patient much sooner if necessary        Orders Placed This Encounter  Procedures  . CT Abdomen Pelvis Wo Contrast    Standing Status:   Future    Standing Expiration Date:   10/13/2021    Order  Specific Question:   Preferred imaging location?    Answer:   [facility]    Order Specific Question:   Is Oral Contrast requested for this exam?    Answer:   Yes, Per Radiology protocol  . CT Chest Wo Contrast    Standing Status:   Future    Standing Expiration Date:   10/13/2021    Order Specific Question:   Preferred imaging location?    Answer:   [place], PA-C 10/13/20   ADDENDUM: Hematology/Oncology Attending: I had a face-to-face encounter with the patient today.  I reviewed his record and recommended her care plan.  This is a very pleasant 75 years old white female with a stage IV non-small cell lung cancer, poorly differentiated adenocarcinoma diagnosed in June 2021 with PD-L1 expression of 100% and no actionable mutations.  The patient has been on treatment with carboplatin, Alimta and Keytruda for 4 cycles followed by maintenance treatment with single agent Keytruda status post 6 more cycles  The patient has been tolerating the treatment well but recently she developed grade 3 skin rash that was recurrent and 2 different occasion and improved significantly with high-dose prednisone. She missed the last dose of her treatment because of the skin rash and she was treated with a tapered dose of prednisone over the last few weeks. Her rash is getting much better but the patient is not interested in proceeding with any further treatment with immunotherapy for now. I agreed with her decision and we will see her back for follow-up visit in around 2 months for evaluation and repeat CT scan of the chest, abdomen pelvis for restaging of her disease. The patient was advised to call immediately if she has any other concerning symptoms in the interval.  The total time spent in the appointment was 35 minutes. Disclaimer: This note was dictated with voice recognition software. Similar sounding words can inadvertently be transcribed and may be  missed upon review. Eilleen Kempf, MD 10/13/20

## 2020-10-13 ENCOUNTER — Inpatient Hospital Stay: Payer: Medicare Other

## 2020-10-13 ENCOUNTER — Inpatient Hospital Stay: Payer: Medicare Other | Admitting: Physician Assistant

## 2020-10-13 ENCOUNTER — Telehealth: Payer: Self-pay | Admitting: Physician Assistant

## 2020-10-13 ENCOUNTER — Other Ambulatory Visit: Payer: Self-pay

## 2020-10-13 VITALS — BP 133/65 | HR 100 | Temp 97.6°F | Resp 19 | Ht 68.0 in | Wt 139.7 lb

## 2020-10-13 DIAGNOSIS — Z95828 Presence of other vascular implants and grafts: Secondary | ICD-10-CM | POA: Diagnosis not present

## 2020-10-13 DIAGNOSIS — R21 Rash and other nonspecific skin eruption: Secondary | ICD-10-CM | POA: Diagnosis not present

## 2020-10-13 DIAGNOSIS — Z452 Encounter for adjustment and management of vascular access device: Secondary | ICD-10-CM | POA: Diagnosis not present

## 2020-10-13 DIAGNOSIS — C3492 Malignant neoplasm of unspecified part of left bronchus or lung: Secondary | ICD-10-CM

## 2020-10-13 DIAGNOSIS — Z7189 Other specified counseling: Secondary | ICD-10-CM | POA: Diagnosis not present

## 2020-10-13 DIAGNOSIS — C3412 Malignant neoplasm of upper lobe, left bronchus or lung: Secondary | ICD-10-CM | POA: Diagnosis not present

## 2020-10-13 DIAGNOSIS — Z79899 Other long term (current) drug therapy: Secondary | ICD-10-CM | POA: Diagnosis not present

## 2020-10-13 LAB — CBC WITH DIFFERENTIAL (CANCER CENTER ONLY)
Abs Immature Granulocytes: 0.08 10*3/uL — ABNORMAL HIGH (ref 0.00–0.07)
Basophils Absolute: 0 10*3/uL (ref 0.0–0.1)
Basophils Relative: 0 %
Eosinophils Absolute: 0 10*3/uL (ref 0.0–0.5)
Eosinophils Relative: 1 %
HCT: 37.1 % (ref 36.0–46.0)
Hemoglobin: 11.5 g/dL — ABNORMAL LOW (ref 12.0–15.0)
Immature Granulocytes: 1 %
Lymphocytes Relative: 10 %
Lymphs Abs: 0.8 10*3/uL (ref 0.7–4.0)
MCH: 30.7 pg (ref 26.0–34.0)
MCHC: 31 g/dL (ref 30.0–36.0)
MCV: 99.2 fL (ref 80.0–100.0)
Monocytes Absolute: 0.4 10*3/uL (ref 0.1–1.0)
Monocytes Relative: 5 %
Neutro Abs: 6.3 10*3/uL (ref 1.7–7.7)
Neutrophils Relative %: 83 %
Platelet Count: 133 10*3/uL — ABNORMAL LOW (ref 150–400)
RBC: 3.74 MIL/uL — ABNORMAL LOW (ref 3.87–5.11)
RDW: 13.8 % (ref 11.5–15.5)
WBC Count: 7.6 10*3/uL (ref 4.0–10.5)
nRBC: 0 % (ref 0.0–0.2)

## 2020-10-13 LAB — CMP (CANCER CENTER ONLY)
ALT: 13 U/L (ref 0–44)
AST: 14 U/L — ABNORMAL LOW (ref 15–41)
Albumin: 3.5 g/dL (ref 3.5–5.0)
Alkaline Phosphatase: 63 U/L (ref 38–126)
Anion gap: 12 (ref 5–15)
BUN: 28 mg/dL — ABNORMAL HIGH (ref 8–23)
CO2: 26 mmol/L (ref 22–32)
Calcium: 8.1 mg/dL — ABNORMAL LOW (ref 8.9–10.3)
Chloride: 105 mmol/L (ref 98–111)
Creatinine: 1.24 mg/dL — ABNORMAL HIGH (ref 0.44–1.00)
GFR, Estimated: 46 mL/min — ABNORMAL LOW (ref >60)
Glucose, Bld: 96 mg/dL (ref 70–99)
Potassium: 3.7 mmol/L (ref 3.5–5.1)
Sodium: 143 mmol/L (ref 135–145)
Total Bilirubin: 0.4 mg/dL (ref 0.3–1.2)
Total Protein: 6.4 g/dL — ABNORMAL LOW (ref 6.5–8.1)

## 2020-10-13 LAB — TSH: TSH: 1.075 u[IU]/mL (ref 0.308–3.960)

## 2020-10-13 MED ORDER — SODIUM CHLORIDE 0.9% FLUSH
10.0000 mL | INTRAVENOUS | Status: DC | PRN
  Administered 2020-10-13: 10 mL via INTRAVENOUS
  Filled 2020-10-13: qty 10

## 2020-10-13 MED ORDER — HEPARIN SOD (PORK) LOCK FLUSH 100 UNIT/ML IV SOLN
500.0000 [IU] | Freq: Once | INTRAVENOUS | Status: AC
  Administered 2020-10-13: 500 [IU] via INTRAVENOUS
  Filled 2020-10-13: qty 5

## 2020-10-13 NOTE — Telephone Encounter (Signed)
Scheduled per los. Gave avs and calendar  

## 2020-10-14 DIAGNOSIS — J449 Chronic obstructive pulmonary disease, unspecified: Secondary | ICD-10-CM | POA: Diagnosis not present

## 2020-11-04 ENCOUNTER — Other Ambulatory Visit: Payer: Medicare Other

## 2020-11-04 ENCOUNTER — Ambulatory Visit: Payer: Medicare Other | Admitting: Internal Medicine

## 2020-11-04 ENCOUNTER — Ambulatory Visit: Payer: Medicare Other

## 2020-11-13 DIAGNOSIS — J449 Chronic obstructive pulmonary disease, unspecified: Secondary | ICD-10-CM | POA: Diagnosis not present

## 2020-11-14 DIAGNOSIS — K219 Gastro-esophageal reflux disease without esophagitis: Secondary | ICD-10-CM | POA: Diagnosis not present

## 2020-11-24 ENCOUNTER — Encounter: Payer: Self-pay | Admitting: Internal Medicine

## 2020-11-25 ENCOUNTER — Other Ambulatory Visit: Payer: Medicare Other

## 2020-11-25 ENCOUNTER — Ambulatory Visit: Payer: Medicare Other | Admitting: Internal Medicine

## 2020-11-25 ENCOUNTER — Ambulatory Visit: Payer: Medicare Other

## 2020-12-10 ENCOUNTER — Ambulatory Visit (HOSPITAL_COMMUNITY)
Admission: RE | Admit: 2020-12-10 | Discharge: 2020-12-10 | Disposition: A | Discharge status: Home / Self Care | Payer: Medicare Other | Source: Ambulatory Visit | Attending: Physician Assistant | Admitting: Physician Assistant

## 2020-12-10 ENCOUNTER — Inpatient Hospital Stay: Payer: Medicare Other | Attending: Internal Medicine

## 2020-12-10 ENCOUNTER — Other Ambulatory Visit: Payer: Self-pay

## 2020-12-10 ENCOUNTER — Other Ambulatory Visit: Payer: Medicare Other

## 2020-12-10 DIAGNOSIS — I700 Atherosclerosis of aorta: Secondary | ICD-10-CM | POA: Diagnosis not present

## 2020-12-10 DIAGNOSIS — I2510 Atherosclerotic heart disease of native coronary artery without angina pectoris: Secondary | ICD-10-CM | POA: Diagnosis not present

## 2020-12-10 DIAGNOSIS — C3492 Malignant neoplasm of unspecified part of left bronchus or lung: Secondary | ICD-10-CM

## 2020-12-10 DIAGNOSIS — C3412 Malignant neoplasm of upper lobe, left bronchus or lung: Secondary | ICD-10-CM | POA: Insufficient documentation

## 2020-12-10 DIAGNOSIS — R21 Rash and other nonspecific skin eruption: Secondary | ICD-10-CM | POA: Diagnosis not present

## 2020-12-10 DIAGNOSIS — I272 Pulmonary hypertension, unspecified: Secondary | ICD-10-CM | POA: Diagnosis not present

## 2020-12-10 DIAGNOSIS — C3490 Malignant neoplasm of unspecified part of unspecified bronchus or lung: Secondary | ICD-10-CM | POA: Diagnosis not present

## 2020-12-10 DIAGNOSIS — Z79899 Other long term (current) drug therapy: Secondary | ICD-10-CM | POA: Insufficient documentation

## 2020-12-10 DIAGNOSIS — Z95828 Presence of other vascular implants and grafts: Secondary | ICD-10-CM

## 2020-12-10 DIAGNOSIS — I313 Pericardial effusion (noninflammatory): Secondary | ICD-10-CM | POA: Diagnosis not present

## 2020-12-10 LAB — CBC WITH DIFFERENTIAL (CANCER CENTER ONLY)
Abs Immature Granulocytes: 0.05 10*3/uL (ref 0.00–0.07)
Basophils Absolute: 0.1 10*3/uL (ref 0.0–0.1)
Basophils Relative: 1 %
Eosinophils Absolute: 0.1 10*3/uL (ref 0.0–0.5)
Eosinophils Relative: 1 %
HCT: 34.6 % — ABNORMAL LOW (ref 36.0–46.0)
Hemoglobin: 11 g/dL — ABNORMAL LOW (ref 12.0–15.0)
Immature Granulocytes: 1 %
Lymphocytes Relative: 15 %
Lymphs Abs: 1.2 10*3/uL (ref 0.7–4.0)
MCH: 29.8 pg (ref 26.0–34.0)
MCHC: 31.8 g/dL (ref 30.0–36.0)
MCV: 93.8 fL (ref 80.0–100.0)
Monocytes Absolute: 0.9 10*3/uL (ref 0.1–1.0)
Monocytes Relative: 12 %
Neutro Abs: 5.6 10*3/uL (ref 1.7–7.7)
Neutrophils Relative %: 70 %
Platelet Count: 211 10*3/uL (ref 150–400)
RBC: 3.69 MIL/uL — ABNORMAL LOW (ref 3.87–5.11)
RDW: 13.8 % (ref 11.5–15.5)
WBC Count: 7.9 10*3/uL (ref 4.0–10.5)
nRBC: 0 % (ref 0.0–0.2)

## 2020-12-10 LAB — CMP (CANCER CENTER ONLY)
ALT: 6 U/L (ref 0–44)
AST: 15 U/L (ref 15–41)
Albumin: 3.6 g/dL (ref 3.5–5.0)
Alkaline Phosphatase: 66 U/L (ref 38–126)
Anion gap: 12 (ref 5–15)
BUN: 31 mg/dL — ABNORMAL HIGH (ref 8–23)
CO2: 24 mmol/L (ref 22–32)
Calcium: 9.1 mg/dL (ref 8.9–10.3)
Chloride: 104 mmol/L (ref 98–111)
Creatinine: 1.41 mg/dL — ABNORMAL HIGH (ref 0.44–1.00)
GFR, Estimated: 39 mL/min — ABNORMAL LOW (ref >60)
Glucose, Bld: 92 mg/dL (ref 70–99)
Potassium: 4 mmol/L (ref 3.5–5.1)
Sodium: 140 mmol/L (ref 135–145)
Total Bilirubin: 0.3 mg/dL (ref 0.3–1.2)
Total Protein: 6.8 g/dL (ref 6.5–8.1)

## 2020-12-10 MED ORDER — SODIUM CHLORIDE 0.9% FLUSH
10.0000 mL | Freq: Once | INTRAVENOUS | Status: AC
  Administered 2020-12-10: 10 mL
  Filled 2020-12-10: qty 10

## 2020-12-10 MED ORDER — HEPARIN SOD (PORK) LOCK FLUSH 100 UNIT/ML IV SOLN
500.0000 [IU] | Freq: Once | INTRAVENOUS | Status: AC
  Administered 2020-12-10: 500 [IU] via INTRAVENOUS

## 2020-12-10 MED ORDER — HEPARIN SOD (PORK) LOCK FLUSH 100 UNIT/ML IV SOLN
INTRAVENOUS | Status: AC
  Filled 2020-12-10: qty 5

## 2020-12-14 ENCOUNTER — Inpatient Hospital Stay: Payer: Medicare Other | Admitting: Internal Medicine

## 2020-12-14 ENCOUNTER — Encounter: Payer: Self-pay | Admitting: Internal Medicine

## 2020-12-14 ENCOUNTER — Other Ambulatory Visit: Payer: Self-pay

## 2020-12-14 VITALS — BP 102/63 | HR 103 | Temp 97.6°F | Resp 19 | Ht 68.0 in | Wt 137.9 lb

## 2020-12-14 DIAGNOSIS — J449 Chronic obstructive pulmonary disease, unspecified: Secondary | ICD-10-CM | POA: Diagnosis not present

## 2020-12-14 DIAGNOSIS — I272 Pulmonary hypertension, unspecified: Secondary | ICD-10-CM | POA: Diagnosis not present

## 2020-12-14 DIAGNOSIS — C3492 Malignant neoplasm of unspecified part of left bronchus or lung: Secondary | ICD-10-CM | POA: Diagnosis not present

## 2020-12-14 DIAGNOSIS — R21 Rash and other nonspecific skin eruption: Secondary | ICD-10-CM | POA: Diagnosis not present

## 2020-12-14 DIAGNOSIS — Z79899 Other long term (current) drug therapy: Secondary | ICD-10-CM | POA: Diagnosis not present

## 2020-12-14 DIAGNOSIS — C3412 Malignant neoplasm of upper lobe, left bronchus or lung: Secondary | ICD-10-CM | POA: Diagnosis not present

## 2020-12-14 DIAGNOSIS — Z5112 Encounter for antineoplastic immunotherapy: Secondary | ICD-10-CM

## 2020-12-14 NOTE — Progress Notes (Signed)
    [facility] [phone]   [phone]  OFFICE PROGRESS NOTE  Hall, [name], MD [address] [place] [address]  DIAGNOSIS: Stage IV (T3, N2, M1a) non-small cell lung cancer, poorly differentiated adenocarcinoma presented with large left upper lobe lung mass in addition to mediastinal lymphadenopathy and bilateral pulmonary nodules diagnosed in June 2021.  Biomarker Findings Tumor Mutational Burden - 10 Muts/Mb Microsatellite status - MS-Stable Genomic Findings For a complete list of the genes assayed, please refer to the Appendix. NF1 Q2582* KRAS G12V CTNNB1 A622fs*10 TP53 H179N 7 Disease relevant genes with no reportable alterations: ALK, BRAF, EGFR, ERBB2, MET, RET, ROS1  PDL1 Expression: 100%  PRIOR THERAPY: Palliative radiotherapy to the large left upper lobe lung mass.  CURRENT THERAPY: Systemic chemotherapy with carboplatin for AUC of 5, Alimta 500 mg/M2 and Keytruda 200 mg IV every 3 weeks.  First dose 02/12/2020.  Status post 10 cycles.  Starting from cycle #5 she was on maintenance treatment with Alimta and Keytruda every 3 weeks.  Starting cycle #9 she will be on single agent Keytruda every 3 weeks.  INTERVAL HISTORY: Kayla Sawyer 75 y.o. female returns to the clinic today for follow-up visit accompanied by her husband.  The patient continues to complain of increasing fatigue and weakness as well as shortness of breath at baseline increased with exertion.  She did not notice any difference when she took a break of the treatment for the last 2 months.  She denied having any current chest pain, cough or hemoptysis.  She denied having any nausea, vomiting, diarrhea or constipation.  She has no headache or visual changes.  She had repeat CT scan of the chest, abdomen pelvis performed recently and she is here for evaluation and discussion of her risk her results.   MEDICAL HISTORY: Past Medical History:  Diagnosis Date  .  Arthritis   . COPD (chronic obstructive pulmonary disease) (HCC)   . Depression   . E. coli pyelonephritis 6//2014  . GERD (gastroesophageal reflux disease)   . Pneumonia   . Pulmonary hypertension (HCC)   . Sepsis (HCC)     ALLERGIES:  is allergic to elemental sulfur.  MEDICATIONS:  Current Outpatient Medications  Medication Sig Dispense Refill  . acetaminophen (TYLENOL) 500 MG tablet Take 500 mg by mouth every 6 (six) hours as needed for mild pain.    . albuterol (PROVENTIL) (2.5 MG/3ML) 0.083% nebulizer solution Take 3 mLs (2.5 mg total) by nebulization every 6 (six) hours as needed for wheezing or shortness of breath. 75 mL 12  . aspirin EC 81 MG tablet Take 1 tablet (81 mg total) by mouth daily.    . atorvastatin (LIPITOR) 40 MG tablet Take 40 mg by mouth daily.    . folic acid (FOLVITE) 1 MG tablet Take 1 tablet (1 mg total) by mouth daily. 30 tablet 4  . levocetirizine (XYZAL) 5 MG tablet Take 5 mg by mouth every evening.     . lidocaine-prilocaine (EMLA) cream Apply to the Port-A-Cath site 30-60-minute before chemotherapy every 3 weeks. 30 g 0  . Multiple Vitamin (MULTIVITAMIN WITH MINERALS) TABS tablet Take 1 tablet by mouth daily.    . mupirocin ointment (BACTROBAN) 2 % SMARTSIG:1 Application Topical 2-3 Times Daily    . omeprazole (PRILOSEC) 20 MG capsule Take 20 mg by mouth daily.     . prochlorperazine (COMPAZINE) 10 MG tablet Take 1 tablet (10 mg total) by mouth every 6 (  six) hours as needed for nausea or vomiting. 30 tablet 0  . sertraline (ZOLOFT) 100 MG tablet Take 100 mg by mouth daily.     . TRELEGY ELLIPTA 100-62.5-25 MCG/INH AEPB Inhale 1 puff into the lungs daily.     No current facility-administered medications for this visit.   Facility-Administered Medications Ordered in Other Visits  Medication Dose Route Frequency Provider Last Rate Last Admin  . sodium chloride flush (NS) 0.9 % injection 10 mL  10 mL Intravenous PRN Curt Bears, MD        SURGICAL  HISTORY:  Past Surgical History:  Procedure Laterality Date  . ABDOMINAL HYSTERECTOMY     partial  . AORTA - BILATERAL FEMORAL ARTERY BYPASS GRAFT Bilateral 06/14/2016   Procedure: AORTOBIFEMORAL BYPASS GRAFT AORTA SMA BYPASS GRAFT RE-IMPLANTATION  OF SUPERIOR MESENTERIC ARTERY;  Surgeon: Serafina Mitchell, MD;  Location: [facility];  Service: Vascular;  Laterality: Bilateral;  . BACK SURGERY     L4/5  . CARDIAC CATHETERIZATION N/A 01/03/2016   Procedure: Right/Left Heart Cath and Coronary Angiography;  Surgeon: Jolaine Artist, MD;  Location: [facility];  Service: Cardiovascular;  Laterality: N/A;  . COLONOSCOPY N/A 08/05/2015   Procedure: COLONOSCOPY;  Surgeon: Rogene Houston, MD;  Location: [facility];  Service: Endoscopy;  Laterality: N/A;  730  . ELBOW SURGERY    . IR IMAGING GUIDED PORT INSERTION  02/16/2020  . PERIPHERAL VASCULAR CATHETERIZATION N/A 02/16/2016   Procedure: Abdominal Aortogram w/Lower Extremity;  Surgeon: [facility] Hampshire, MD;  Location: [facility];  Service: Cardiovascular;  Laterality: N/A;    REVIEW OF SYSTEMS:  Constitutional: positive for fatigue Eyes: negative Ears, nose, mouth, throat, and face: negative Respiratory: positive for dyspnea on exertion Cardiovascular: negative Gastrointestinal: negative Genitourinary:negative Integument/breast: positive for dryness Hematologic/lymphatic: negative Musculoskeletal:positive for muscle weakness Neurological: negative Behavioral/Psych: negative Endocrine: negative Allergic/Immunologic: negative   PHYSICAL EXAMINATION: General appearance: alert, cooperative, fatigued and no distress Head: Normocephalic, without obvious abnormality, atraumatic Neck: no adenopathy, no JVD, supple, symmetrical, trachea midline and thyroid not enlarged, symmetric, no tenderness/mass/nodules Lymph nodes: Cervical, supraclavicular, and axillary nodes normal. Resp: clear to auscultation bilaterally Back: symmetric, no  curvature. ROM normal. No CVA tenderness. Cardio: regular rate and rhythm, S1, S2 normal, no murmur, click, rub or gallop GI: soft, non-tender; bowel sounds normal; no masses,  no organomegaly Extremities: extremities normal, atraumatic, no cyanosis or edema Neurologic: Alert and oriented X 3, normal strength and tone. Normal symmetric reflexes. Normal coordination and gait    ECOG PERFORMANCE STATUS: 1 - Symptomatic but completely ambulatory  Blood pressure 102/63, pulse (!) 103, temperature 97.6 F (36.4 C), temperature source Tympanic, resp. rate 19, height 5' 8" (1.727 m), weight 137 lb 14.4 oz (62.6 kg), SpO2 94 %.  LABORATORY DATA: Lab Results  Component Value Date   WBC 7.9 12/10/2020   HGB 11.0 (L) 12/10/2020   HCT 34.6 (L) 12/10/2020   MCV 93.8 12/10/2020   PLT 211 12/10/2020      Chemistry      Component Value Date/Time   NA 140 12/10/2020 1148   K 4.0 12/10/2020 1148   CL 104 12/10/2020 1148   CO2 24 12/10/2020 1148   BUN 31 (H) 12/10/2020 1148   CREATININE 1.41 (H) 12/10/2020 1148   CREATININE 0.90 02/08/2016 0949      Component Value Date/Time   CALCIUM 9.1 12/10/2020 1148   ALKPHOS 66 12/10/2020 1148   AST 15 12/10/2020 1148   ALT  6 12/10/2020 1148   BILITOT 0.3 12/10/2020 1148       RADIOGRAPHIC STUDIES: CT Abdomen Pelvis Wo Contrast  Result Date: 12/11/2020 CLINICAL DATA:  Non-small cell lung cancer, monitor, no current treatment EXAM: CT CHEST, ABDOMEN AND PELVIS WITHOUT CONTRAST TECHNIQUE: Multidetector CT imaging of the chest, abdomen and pelvis was performed following the standard protocol without IV contrast. COMPARISON:  08/24/2020 FINDINGS: CT CHEST FINDINGS Cardiovascular: Right chest port catheter. Aortic atherosclerosis. Normal heart size. Three-vessel coronary artery calcifications. Unchanged small pericardial effusion. Mediastinum/Nodes: Unchanged post treatment appearance of soft tissue about the left hilum. No discretely enlarged  mediastinal, hilar, or axillary lymph nodes. Thyroid gland, trachea, and esophagus demonstrate no significant findings. Lungs/Pleura: Severe centrilobular emphysema. Diffuse bilateral bronchial wall thickening. Continued increase in dense consolidation of the perihilar left upper lobe, central, dominant component measuring 4.2 x 3.4 x 2.3 cm (series 4, image 64, series 5, image 62). Interval increase in volume loss of the left upper lobe. A previously noted subsolid nodule of the medial right lower lobe is difficult to discretely appreciate (series 4, image 121). No pleural effusion or pneumothorax. Musculoskeletal: No chest wall mass or suspicious bone lesions identified. CT ABDOMEN PELVIS FINDINGS Hepatobiliary: No solid liver abnormality is seen. No gallstones, gallbladder wall thickening, or biliary dilatation. Pancreas: Unremarkable. No pancreatic ductal dilatation or surrounding inflammatory changes. Spleen: Normal in size without significant abnormality. Adrenals/Urinary Tract: Adrenal glands are unremarkable. Kidneys are normal, without renal calculi, solid lesion, or hydronephrosis. Bladder is unremarkable. Stomach/Bowel: Stomach is within normal limits. Appendix appears normal. No evidence of bowel wall thickening, distention, or inflammatory changes. Occasional sigmoid diverticula. Vascular/Lymphatic: Aortic atherosclerosis. Status post aortobifemoral graft repair. No enlarged abdominal or pelvic lymph nodes. Reproductive: Status post hysterectomy. Other: No abdominal wall hernia or abnormality. No abdominopelvic ascites. Musculoskeletal: No acute or significant osseous findings. IMPRESSION: 1. Continued increase in dense consolidation of the perihilar left upper lobe, central, dominant component measuring 4.2 x 3.4 x 2.3 cm. Interval increase in volume loss of the left upper lobe. This may reflect developing radiation fibrosis if radiation was administered in the previous ~3-6 months but is otherwise  morphologically concerning for recurrent malignant. Attention on follow-up. 2. No noncontrast evidence of metastatic disease in the abdomen or pelvis. 3. Emphysema. 4. Coronary artery disease. 5. Status post aortobifemoral graft repair. Aortic Atherosclerosis (ICD10-I70.0) and Emphysema (ICD10-J43.9). Electronically Signed   By: Eddie Candle M.D.   On: 12/11/2020 19:59   CT Chest Wo Contrast  Result Date: 12/11/2020 CLINICAL DATA:  Non-small cell lung cancer, monitor, no current treatment EXAM: CT CHEST, ABDOMEN AND PELVIS WITHOUT CONTRAST TECHNIQUE: Multidetector CT imaging of the chest, abdomen and pelvis was performed following the standard protocol without IV contrast. COMPARISON:  08/24/2020 FINDINGS: CT CHEST FINDINGS Cardiovascular: Right chest port catheter. Aortic atherosclerosis. Normal heart size. Three-vessel coronary artery calcifications. Unchanged small pericardial effusion. Mediastinum/Nodes: Unchanged post treatment appearance of soft tissue about the left hilum. No discretely enlarged mediastinal, hilar, or axillary lymph nodes. Thyroid gland, trachea, and esophagus demonstrate no significant findings. Lungs/Pleura: Severe centrilobular emphysema. Diffuse bilateral bronchial wall thickening. Continued increase in dense consolidation of the perihilar left upper lobe, central, dominant component measuring 4.2 x 3.4 x 2.3 cm (series 4, image 64, series 5, image 62). Interval increase in volume loss of the left upper lobe. A previously noted subsolid nodule of the medial right lower lobe is difficult to discretely appreciate (series 4, image 121). No pleural effusion or pneumothorax. Musculoskeletal: No chest wall  mass or suspicious bone lesions identified. CT ABDOMEN PELVIS FINDINGS Hepatobiliary: No solid liver abnormality is seen. No gallstones, gallbladder wall thickening, or biliary dilatation. Pancreas: Unremarkable. No pancreatic ductal dilatation or surrounding inflammatory changes. Spleen:  Normal in size without significant abnormality. Adrenals/Urinary Tract: Adrenal glands are unremarkable. Kidneys are normal, without renal calculi, solid lesion, or hydronephrosis. Bladder is unremarkable. Stomach/Bowel: Stomach is within normal limits. Appendix appears normal. No evidence of bowel wall thickening, distention, or inflammatory changes. Occasional sigmoid diverticula. Vascular/Lymphatic: Aortic atherosclerosis. Status post aortobifemoral graft repair. No enlarged abdominal or pelvic lymph nodes. Reproductive: Status post hysterectomy. Other: No abdominal wall hernia or abnormality. No abdominopelvic ascites. Musculoskeletal: No acute or significant osseous findings. IMPRESSION: 1. Continued increase in dense consolidation of the perihilar left upper lobe, central, dominant component measuring 4.2 x 3.4 x 2.3 cm. Interval increase in volume loss of the left upper lobe. This may reflect developing radiation fibrosis if radiation was administered in the previous ~3-6 months but is otherwise morphologically concerning for recurrent malignant. Attention on follow-up. 2. No noncontrast evidence of metastatic disease in the abdomen or pelvis. 3. Emphysema. 4. Coronary artery disease. 5. Status post aortobifemoral graft repair. Aortic Atherosclerosis (ICD10-I70.0) and Emphysema (ICD10-J43.9). Electronically Signed   By: Alex  Bibbey M.D.   On: 12/11/2020 19:59    ASSESSMENT AND PLAN: This is a very pleasant 74 years old white female with a stage IV (T3, N2, M1a) non-small cell lung cancer, poorly differentiated adenocarcinoma diagnosed in June 2021 and presented with large left upper lobe lung mass with mediastinal invasion in addition to mediastinal lymphadenopathy as well as bilateral pulmonary nodules. The patient has no actionable mutations but PD-L1 expression is 100%. Because of the bulky disease I recommended for the patient to consider the combination chemotherapy.  She is interested in this  option. She is currently being treated with carboplatin for AUC of 5, Alimta 500 mg/M2 and Keytruda 200 mg IV every 3 weeks status post 10 cycles.  Starting from cycle #5 she is on maintenance treatment with Alimta and Keytruda every 3 weeks.  Starting from cycle #9 she is on treatment with single agent Keytruda. The patient has been tolerating her treatment well except for the skin rash and she was treated with a tapered dose of prednisone.  Her last treatment was in March 2022 and the patient decided to take a break off treatment at that time. She had repeat CT scan of the chest, abdomen pelvis performed recently.  I personally and independently reviewed the scans and discussed the results with the patient and her husband today. Her scan showed worsening consolidation in the left upper lobe opacity concerning for disease progression.  The patient did not receive any additional radiotherapy during this interval. I discussed with her her treatment options and I gave her the option of resuming her treatment with Keytruda as a single agent versus continuous observation and monitoring.  The patient would like to resume her treatment. If she develops any disease progression on the treatment with Keytruda, I will consider switching her treatment to systemic chemotherapy. She is expected to start cycle #11 in around 2 weeks [place] The patient will come back for follow-up visit in around 5 weeks with the start of cycle #12. She was advised to call immediately if she has any other concerning symptoms in the interval. The patient voices understanding of current disease status and treatment options and is in agreement with   the current care plan. All questions were answered. The patient knows to call the clinic with any problems, questions or concerns. We can certainly see the patient much sooner if necessary.  Disclaimer: This note was dictated with voice  recognition software. Similar sounding words can inadvertently be transcribed and may not be corrected upon review.       

## 2020-12-14 NOTE — Patient Instructions (Signed)
Steps to Quit Smoking Smoking tobacco is the leading cause of preventable death. It can affect almost every organ in the body. Smoking puts you and people around you at risk for many serious, long-lasting (chronic) diseases. Quitting smoking can be hard, but it is one of the best things that you can do for your health. It is never too late to quit. How do I get ready to quit? When you decide to quit smoking, make a plan to help you succeed. Before you quit:  Pick a date to quit. Set a date within the next 2 weeks to give you time to prepare.  Write down the reasons why you are quitting. Keep this list in places where you will see it often.  Tell your family, friends, and co-workers that you are quitting. Their support is important.  Talk with your doctor about the choices that may help you quit.  Find out if your health insurance will pay for these treatments.  Know the people, places, things, and activities that make you want to smoke (triggers). Avoid them. What first steps can I take to quit smoking?  Throw away all cigarettes at home, at work, and in your car.  Throw away the things that you use when you smoke, such as ashtrays and lighters.  Clean your car. Make sure to empty the ashtray.  Clean your home, including curtains and carpets. What can I do to help me quit smoking? Talk with your doctor about taking medicines and seeing a counselor at the same time. You are more likely to succeed when you do both.  If you are pregnant or breastfeeding, talk with your doctor about counseling or other ways to quit smoking. Do not take medicine to help you quit smoking unless your doctor tells you to do so. To quit smoking: Quit right away  Quit smoking totally, instead of slowly cutting back on how much you smoke over a period of time.  Go to counseling. You are more likely to quit if you go to counseling sessions regularly. Take medicine You may take medicines to help you quit. Some  medicines need a prescription, and some you can buy over-the-counter. Some medicines may contain a drug called nicotine to replace the nicotine in cigarettes. Medicines may:  Help you to stop having the desire to smoke (cravings).  Help to stop the problems that come when you stop smoking (withdrawal symptoms). Your doctor may ask you to use:  Nicotine patches, gum, or lozenges.  Nicotine inhalers or sprays.  Non-nicotine medicine that is taken by mouth. Find resources Find resources and other ways to help you quit smoking and remain smoke-free after you quit. These resources are most helpful when you use them often. They include:  Online chats with a counselor.  Phone quitlines.  Printed self-help materials.  Support groups or group counseling.  Text messaging programs.  Mobile phone apps. Use apps on your mobile phone or tablet that can help you stick to your quit plan. There are many free apps for mobile phones and tablets as well as websites. Examples include Quit Guide from the CDC and smokefree.gov   What things can I do to make it easier to quit?  Talk to your family and friends. Ask them to support and encourage you.  Call a phone quitline (1-800-QUIT-NOW), reach out to support groups, or work with a counselor.  Ask people who smoke to not smoke around you.  Avoid places that make you want to smoke,   such as: ? Bars. ? Parties. ? Smoke-break areas at work.  Spend time with people who do not smoke.  Lower the stress in your life. Stress can make you want to smoke. Try these things to help your stress: ? Getting regular exercise. ? Doing deep-breathing exercises. ? Doing yoga. ? Meditating. ? Doing a body scan. To do this, close your eyes, focus on one area of your body at a time from head to toe. Notice which parts of your body are tense. Try to relax the muscles in those areas.   How will I feel when I quit smoking? Day 1 to 3 weeks Within the first 24 hours,  you may start to have some problems that come from quitting tobacco. These problems are very bad 2-3 days after you quit, but they do not often last for more than 2-3 weeks. You may get these symptoms:  Mood swings.  Feeling restless, nervous, angry, or annoyed.  Trouble concentrating.  Dizziness.  Strong desire for high-sugar foods and nicotine.  Weight gain.  Trouble pooping (constipation).  Feeling like you may vomit (nausea).  Coughing or a sore throat.  Changes in how the medicines that you take for other issues work in your body.  Depression.  Trouble sleeping (insomnia). Week 3 and afterward After the first 2-3 weeks of quitting, you may start to notice more positive results, such as:  Better sense of smell and taste.  Less coughing and sore throat.  Slower heart rate.  Lower blood pressure.  Clearer skin.  Better breathing.  Fewer sick days. Quitting smoking can be hard. Do not give up if you fail the first time. Some people need to try a few times before they succeed. Do your best to stick to your quit plan, and talk with your doctor if you have any questions or concerns. Summary  Smoking tobacco is the leading cause of preventable death. Quitting smoking can be hard, but it is one of the best things that you can do for your health.  When you decide to quit smoking, make a plan to help you succeed.  Quit smoking right away, not slowly over a period of time.  When you start quitting, seek help from your doctor, family, or friends. This information is not intended to replace advice given to you by your health care provider. Make sure you discuss any questions you have with your health care provider. Document Revised: 03/28/2019 Document Reviewed: 09/21/2018 Elsevier Patient Education  2021 Elsevier Inc.  

## 2020-12-29 ENCOUNTER — Inpatient Hospital Stay: Payer: Medicare Other | Attending: Internal Medicine

## 2020-12-29 ENCOUNTER — Inpatient Hospital Stay: Payer: Medicare Other

## 2020-12-29 ENCOUNTER — Other Ambulatory Visit: Payer: Self-pay

## 2020-12-29 VITALS — BP 99/60 | HR 97 | Temp 98.2°F | Resp 16 | Wt 137.8 lb

## 2020-12-29 DIAGNOSIS — C3492 Malignant neoplasm of unspecified part of left bronchus or lung: Secondary | ICD-10-CM

## 2020-12-29 DIAGNOSIS — Z5112 Encounter for antineoplastic immunotherapy: Secondary | ICD-10-CM | POA: Diagnosis not present

## 2020-12-29 DIAGNOSIS — C3412 Malignant neoplasm of upper lobe, left bronchus or lung: Secondary | ICD-10-CM | POA: Insufficient documentation

## 2020-12-29 LAB — CMP (CANCER CENTER ONLY)
ALT: 5 U/L (ref 0–44)
AST: 10 U/L — ABNORMAL LOW (ref 15–41)
Albumin: 3.8 g/dL (ref 3.5–5.0)
Alkaline Phosphatase: 50 U/L (ref 38–126)
Anion gap: 7 (ref 5–15)
BUN: 29 mg/dL — ABNORMAL HIGH (ref 8–23)
CO2: 27 mmol/L (ref 22–32)
Calcium: 9.3 mg/dL (ref 8.9–10.3)
Chloride: 108 mmol/L (ref 98–111)
Creatinine: 1.43 mg/dL — ABNORMAL HIGH (ref 0.44–1.00)
GFR, Estimated: 38 mL/min — ABNORMAL LOW (ref >60)
Glucose, Bld: 90 mg/dL (ref 70–99)
Potassium: 4 mmol/L (ref 3.5–5.1)
Sodium: 142 mmol/L (ref 135–145)
Total Bilirubin: 0.3 mg/dL (ref 0.3–1.2)
Total Protein: 6.6 g/dL (ref 6.5–8.1)

## 2020-12-29 LAB — CBC WITH DIFFERENTIAL (CANCER CENTER ONLY)
Abs Immature Granulocytes: 0.04 10*3/uL (ref 0.00–0.07)
Basophils Absolute: 0 10*3/uL (ref 0.0–0.1)
Basophils Relative: 1 %
Eosinophils Absolute: 0.1 10*3/uL (ref 0.0–0.5)
Eosinophils Relative: 1 %
HCT: 35.2 % — ABNORMAL LOW (ref 36.0–46.0)
Hemoglobin: 10.9 g/dL — ABNORMAL LOW (ref 12.0–15.0)
Immature Granulocytes: 1 %
Lymphocytes Relative: 16 %
Lymphs Abs: 1 10*3/uL (ref 0.7–4.0)
MCH: 29.5 pg (ref 26.0–34.0)
MCHC: 31 g/dL (ref 30.0–36.0)
MCV: 95.4 fL (ref 80.0–100.0)
Monocytes Absolute: 0.8 10*3/uL (ref 0.1–1.0)
Monocytes Relative: 12 %
Neutro Abs: 4.6 10*3/uL (ref 1.7–7.7)
Neutrophils Relative %: 69 %
Platelet Count: 224 10*3/uL (ref 150–400)
RBC: 3.69 MIL/uL — ABNORMAL LOW (ref 3.87–5.11)
RDW: 14.5 % (ref 11.5–15.5)
WBC Count: 6.5 10*3/uL (ref 4.0–10.5)
nRBC: 0 % (ref 0.0–0.2)

## 2020-12-29 MED ORDER — SODIUM CHLORIDE 0.9 % IV SOLN
200.0000 mg | Freq: Once | INTRAVENOUS | Status: AC
  Administered 2020-12-29: 200 mg via INTRAVENOUS
  Filled 2020-12-29: qty 8

## 2020-12-29 MED ORDER — SODIUM CHLORIDE 0.9 % IV SOLN
Freq: Once | INTRAVENOUS | Status: AC
  Filled 2020-12-29: qty 250

## 2020-12-29 MED ORDER — HEPARIN SOD (PORK) LOCK FLUSH 100 UNIT/ML IV SOLN
500.0000 [IU] | Freq: Once | INTRAVENOUS | Status: AC | PRN
  Administered 2020-12-29: 500 [IU]
  Filled 2020-12-29: qty 5

## 2020-12-29 MED ORDER — SODIUM CHLORIDE 0.9% FLUSH
10.0000 mL | INTRAVENOUS | Status: DC | PRN
  Administered 2020-12-29: 10 mL
  Filled 2020-12-29: qty 10

## 2020-12-29 MED ORDER — PROCHLORPERAZINE MALEATE 10 MG PO TABS
10.0000 mg | ORAL_TABLET | Freq: Once | ORAL | Status: AC
  Administered 2020-12-29: 10 mg via ORAL
  Filled 2020-12-29: qty 1

## 2020-12-29 NOTE — Patient Instructions (Signed)
[facility]  Discharge Instructions: Thank you for choosing [facility] to provide your oncology and hematology care.   If you have a lab appointment with the [facility], please go directly to the [facility] and check in at the registration area.   Wear comfortable clothing and clothing appropriate for easy access to any Portacath or PICC line.   We strive to give you quality time with your provider. You may need to reschedule your appointment if you arrive late (15 or more minutes).  Arriving late affects you and other patients whose appointments are after yours.  Also, if you miss three or more appointments without notifying the office, you may be dismissed from the clinic at the provider's discretion.      For prescription refill requests, have your pharmacy contact our office and allow 72 hours for refills to be completed.    Today you received the following chemotherapy and/or immunotherapy agents pembrolizumab   To help prevent nausea and vomiting after your treatment, we encourage you to take your nausea medication as directed.  BELOW ARE SYMPTOMS THAT SHOULD BE REPORTED IMMEDIATELY: *FEVER GREATER THAN 100.4 F (38 C) OR HIGHER *CHILLS OR SWEATING *NAUSEA AND VOMITING THAT IS NOT CONTROLLED WITH YOUR NAUSEA MEDICATION *UNUSUAL SHORTNESS OF BREATH *UNUSUAL BRUISING OR BLEEDING *URINARY PROBLEMS (pain or burning when urinating, or frequent urination) *BOWEL PROBLEMS (unusual diarrhea, constipation, pain near the anus) TENDERNESS IN MOUTH AND THROAT WITH OR WITHOUT PRESENCE OF ULCERS (sore throat, sores in mouth, or a toothache) UNUSUAL RASH, SWELLING OR PAIN  UNUSUAL VAGINAL DISCHARGE OR ITCHING   Items with * indicate a potential emergency and should be followed up as soon as possible or go to the Emergency Department if any problems should occur.  Please show the CHEMOTHERAPY ALERT CARD or IMMUNOTHERAPY ALERT CARD at check-in to the  Emergency Department and triage nurse.  Should you have questions after your visit or need to cancel or reschedule your appointment, please contact [facility]  Dept: [phone]  and follow the prompts.  Office hours are 8:00 a.m. to 4:30 p.m. Monday - Friday. Please note that voicemails left after 4:00 p.m. may not be returned until the following business day.  We are closed weekends and major holidays. You have access to a nurse at all times for urgent questions. Please call the main number to the clinic Dept: [phone] and follow the prompts.   For any non-urgent questions, you may also contact your provider using MyChart. We now offer e-Visits for anyone 21 and older to request care online for non-urgent symptoms. For details visit [email or web address].   Also download the MyChart app! Go to the app store, search "MyChart", open the app, select [facility], and log in with your MyChart username and password.  Due to Covid, a mask is required upon entering the hospital/clinic. If you do not have a mask, one will be given to you upon arrival. For doctor visits, patients may have 1 support person aged 49 or older with them. For treatment visits, patients cannot have anyone with them due to current Covid guidelines and our immunocompromised population.   Pembrolizumab injection What is this medication? PEMBROLIZUMAB (pem broe liz ue mab) is a monoclonal antibody. It is used totreat certain types of cancer. This medicine may be used for other purposes; ask your health care provider orpharmacist if you have questions. COMMON BRAND NAME(S): Keytruda What should I tell my care  team before I take this medication? They need to know if you have any of these conditions: autoimmune diseases like Crohn's disease, ulcerative colitis, or lupus have had or planning to have an allogeneic stem cell transplant (uses someone else's stem cells) history of organ  transplant history of chest radiation nervous system problems like myasthenia gravis or Guillain-Barre syndrome an unusual or allergic reaction to pembrolizumab, other medicines, foods, dyes, or preservatives pregnant or trying to get pregnant breast-feeding How should I use this medication? This medicine is for infusion into a vein. It is given by a health careprofessional in a hospital or clinic setting. A special MedGuide will be given to you before each treatment. Be sure to readthis information carefully each time. Talk to your pediatrician regarding the use of this medicine in children. While this drug may be prescribed for children as young as 6 months for selectedconditions, precautions do apply. Overdosage: If you think you have taken too much of this medicine contact apoison control center or emergency room at once. NOTE: This medicine is only for you. Do not share this medicine with others. What if I miss a dose? It is important not to miss your dose. Call your doctor or health careprofessional if you are unable to keep an appointment. What may interact with this medication? Interactions have not been studied. This list may not describe all possible interactions. Give your health care provider a list of all the medicines, herbs, non-prescription drugs, or dietary supplements you use. Also tell them if you smoke, drink alcohol, or use illegaldrugs. Some items may interact with your medicine. What should I watch for while using this medication? Your condition will be monitored carefully while you are receiving thismedicine. You may need blood work done while you are taking this medicine. Do not become pregnant while taking this medicine or for 4 months after stopping it. Women should inform their doctor if they wish to become pregnant or think they might be pregnant. There is a potential for serious side effects to an unborn child. Talk to your health care professional or pharmacist for  more information. Do not breast-feed an infant while taking this medicine orfor 4 months after the last dose. What side effects may I notice from receiving this medication? Side effects that you should report to your doctor or health care professionalas soon as possible: allergic reactions like skin rash, itching or hives, swelling of the face, lips, or tongue bloody or black, tarry breathing problems changes in vision chest pain chills confusion constipation cough diarrhea dizziness or feeling faint or lightheaded fast or irregular heartbeat fever flushing joint pain low blood counts - this medicine may decrease the number of white blood cells, red blood cells and platelets. You may be at increased risk for infections and bleeding. muscle pain muscle weakness pain, tingling, numbness in the hands or feet persistent headache redness, blistering, peeling or loosening of the skin, including inside the mouth signs and symptoms of high blood sugar such as dizziness; dry mouth; dry skin; fruity breath; nausea; stomach pain; increased hunger or thirst; increased urination signs and symptoms of kidney injury like trouble passing urine or change in the amount of urine signs and symptoms of liver injury like dark urine, light-colored stools, loss of appetite, nausea, right upper belly pain, yellowing of the eyes or skin sweating swollen lymph nodes weight loss Side effects that usually do not require medical attention (report to yourdoctor or health care professional if they continue or are bothersome):  decreased appetite hair loss tiredness This list may not describe all possible side effects. Call your doctor for medical advice about side effects. You may report side effects to FDA at1-800-FDA-1088. Where should I keep my medication? This drug is given in a hospital or clinic and will not be stored at home. NOTE: This sheet is a summary. It may not cover all possible information. If you  have questions about this medicine, talk to your doctor, pharmacist, orhealth care provider.  2022 Elsevier/[employer] Standard (2019-06-04 21:44:53)

## 2021-01-05 DIAGNOSIS — L308 Other specified dermatitis: Secondary | ICD-10-CM | POA: Diagnosis not present

## 2021-01-05 DIAGNOSIS — L304 Erythema intertrigo: Secondary | ICD-10-CM | POA: Diagnosis not present

## 2021-01-05 DIAGNOSIS — L270 Generalized skin eruption due to drugs and medicaments taken internally: Secondary | ICD-10-CM | POA: Diagnosis not present

## 2021-01-13 DIAGNOSIS — K219 Gastro-esophageal reflux disease without esophagitis: Secondary | ICD-10-CM | POA: Diagnosis not present

## 2021-01-13 DIAGNOSIS — J449 Chronic obstructive pulmonary disease, unspecified: Secondary | ICD-10-CM | POA: Diagnosis not present

## 2021-01-18 ENCOUNTER — Ambulatory Visit: Payer: Medicare Other | Admitting: Pulmonary Disease

## 2021-01-18 ENCOUNTER — Encounter: Payer: Self-pay | Admitting: Pulmonary Disease

## 2021-01-18 ENCOUNTER — Other Ambulatory Visit: Payer: Self-pay

## 2021-01-18 VITALS — BP 120/60 | HR 103 | Temp 97.4°F | Ht 68.0 in | Wt 138.0 lb

## 2021-01-18 DIAGNOSIS — J849 Interstitial pulmonary disease, unspecified: Secondary | ICD-10-CM | POA: Diagnosis not present

## 2021-01-18 DIAGNOSIS — C3492 Malignant neoplasm of unspecified part of left bronchus or lung: Secondary | ICD-10-CM

## 2021-01-18 DIAGNOSIS — J432 Centrilobular emphysema: Secondary | ICD-10-CM | POA: Diagnosis not present

## 2021-01-18 DIAGNOSIS — J449 Chronic obstructive pulmonary disease, unspecified: Secondary | ICD-10-CM

## 2021-01-18 DIAGNOSIS — J9611 Chronic respiratory failure with hypoxia: Secondary | ICD-10-CM

## 2021-01-18 NOTE — Assessment & Plan Note (Signed)
Unfortunately she has had disease progression after stopping Keytruda and is not tolerating this medication. This means that she will have to go on chemotherapy.  Surprisingly she has tolerated this earlier

## 2021-01-18 NOTE — Assessment & Plan Note (Signed)
We will send refills on Trelegy and albuterol

## 2021-01-18 NOTE — Progress Notes (Signed)
   Subjective:    Patient ID: Kayla Sawyer, female    DOB: [birth year], 75 y.o.   MRN: [id number]  HPI  75 yo smoker for follow-up of COPD, lung cancer and chronic hypoxic respiratory failure Presented 11/2019 left upper lobe lung mass, hypermetabolic with scattered bilateral nodules and hilar and mediastinal lymphadenopathy >>   Stage IV (T3, N2, M1a) non-small cell lung cancer, poorly differentiated adenocarcinoma , PDL1 Expression: 100%   S/p palliative radiotherapy Systemic chemotherapy with carboplatin , Alimta and Keytruda  every 3 weeks first dose 01/2020   Chief Complaint  Patient presents with   Follow-up    Stopped taking Valaria Good in March due to rash and started taking it again and its been about 3 weeks since she started it back and symptoms have started back and she is probably going to stop it. Wears 4 liters oxygen all the time.    Unfortunately she developed skin rash with Keytruda , she stopped taking this for a while and rash improved.  Unfortunately CT chest 11/2020 which I reviewed showed disease progression with increasing left upper lobe consolidation.  She had not received any radiotherapy in the interim. She has regular wheelchair accompanied by her husband today.  She has increased oxygen to 4 L.  She has again developed skin rash on her palms does not want to take Keytruda anymore.  This is in spite of taking prednisone.  She requests a motorized wheelchair, she gets short of breath on walking even a few steps, her husband has severe hip arthritis and he cannot put Cherina anymore.  She herself has severe osteoarthritis and cannot propel a wheelchair by herself Breathing is getting worse, she is compliant with Trelegy   Significant tests/ events reviewed  PFTs 11/2015 showed ratio 52 with FEV1 53%, FVC 77%, paradoxical drop with bronhodilator, TLC 99% & DLCO 29%   HRCT 07/2016 RUL nodule regressed, no ILD, moderate centrilobular and paraseptal emphysema   11/2019 she  did not desaturate on walking on 3 L pulse oxygen but her heart rate went up from 86-1 37  Review of Systems neg for any significant sore throat, dysphagia, itching, sneezing, nasal congestion or excess/ purulent secretions, fever, chills, sweats, unintended wt loss, pleuritic or exertional cp, hempoptysis, orthopnea pnd or change in chronic leg swelling. Also denies presyncope, palpitations, heartburn, abdominal pain, nausea, vomiting, diarrhea or change in bowel or urinary habits, dysuria,hematuria, rash, arthralgias, visual complaints, headache, numbness weakness or ataxia.     Objective:   Physical Exam  Gen. Pleasant,thin frail in no distress, on 4 L  ENT - no thrush, no pallor/icterus,no post nasal drip Neck: No JVD, no thyromegaly, no carotid bruits Lungs: no use of accessory muscles, no dullness to percussion, clear without rales or rhonchi  Cardiovascular: Rhythm regular, heart sounds  normal, no murmurs or gallops, no peripheral edema Musculoskeletal: No deformities, no cyanosis or clubbing        Assessment & Plan:

## 2021-01-18 NOTE — Patient Instructions (Signed)
Continue  Trelegy Rx for motorized wheelchair

## 2021-01-18 NOTE — Assessment & Plan Note (Signed)
This appears to be worsening, oxygen has been increased to 4 L. We will send in prescription for motorized wheelchair

## 2021-01-19 ENCOUNTER — Inpatient Hospital Stay: Payer: Medicare Other | Attending: Internal Medicine | Admitting: Internal Medicine

## 2021-01-19 ENCOUNTER — Inpatient Hospital Stay: Payer: Medicare Other

## 2021-01-19 VITALS — BP 110/58 | HR 84 | Temp 96.1°F | Resp 18 | Wt 137.1 lb

## 2021-01-19 DIAGNOSIS — C3490 Malignant neoplasm of unspecified part of unspecified bronchus or lung: Secondary | ICD-10-CM | POA: Diagnosis not present

## 2021-01-19 DIAGNOSIS — C3412 Malignant neoplasm of upper lobe, left bronchus or lung: Secondary | ICD-10-CM | POA: Insufficient documentation

## 2021-01-19 DIAGNOSIS — Z79899 Other long term (current) drug therapy: Secondary | ICD-10-CM | POA: Diagnosis not present

## 2021-01-19 DIAGNOSIS — Z5112 Encounter for antineoplastic immunotherapy: Secondary | ICD-10-CM

## 2021-01-19 DIAGNOSIS — C3492 Malignant neoplasm of unspecified part of left bronchus or lung: Secondary | ICD-10-CM | POA: Diagnosis not present

## 2021-01-19 DIAGNOSIS — R21 Rash and other nonspecific skin eruption: Secondary | ICD-10-CM | POA: Diagnosis not present

## 2021-01-19 LAB — CBC WITH DIFFERENTIAL (CANCER CENTER ONLY)
Abs Immature Granulocytes: 0.03 10*3/uL (ref 0.00–0.07)
Basophils Absolute: 0.1 10*3/uL (ref 0.0–0.1)
Basophils Relative: 1 %
Eosinophils Absolute: 0.1 10*3/uL (ref 0.0–0.5)
Eosinophils Relative: 2 %
HCT: 33.6 % — ABNORMAL LOW (ref 36.0–46.0)
Hemoglobin: 11 g/dL — ABNORMAL LOW (ref 12.0–15.0)
Immature Granulocytes: 1 %
Lymphocytes Relative: 16 %
Lymphs Abs: 1 10*3/uL (ref 0.7–4.0)
MCH: 30.2 pg (ref 26.0–34.0)
MCHC: 32.7 g/dL (ref 30.0–36.0)
MCV: 92.3 fL (ref 80.0–100.0)
Monocytes Absolute: 0.7 10*3/uL (ref 0.1–1.0)
Monocytes Relative: 11 %
Neutro Abs: 4.3 10*3/uL (ref 1.7–7.7)
Neutrophils Relative %: 69 %
Platelet Count: 199 10*3/uL (ref 150–400)
RBC: 3.64 MIL/uL — ABNORMAL LOW (ref 3.87–5.11)
RDW: 14.2 % (ref 11.5–15.5)
WBC Count: 6.2 10*3/uL (ref 4.0–10.5)
nRBC: 0 % (ref 0.0–0.2)

## 2021-01-19 LAB — CMP (CANCER CENTER ONLY)
ALT: <6 U/L (ref 0–44)
AST: 13 U/L — ABNORMAL LOW (ref 15–41)
Albumin: 3.5 g/dL (ref 3.5–5.0)
Alkaline Phosphatase: 60 U/L (ref 38–126)
Anion gap: 9 (ref 5–15)
BUN: 33 mg/dL — ABNORMAL HIGH (ref 8–23)
CO2: 24 mmol/L (ref 22–32)
Calcium: 8.9 mg/dL (ref 8.9–10.3)
Chloride: 108 mmol/L (ref 98–111)
Creatinine: 1.52 mg/dL — ABNORMAL HIGH (ref 0.44–1.00)
GFR, Estimated: 36 mL/min — ABNORMAL LOW (ref >60)
Glucose, Bld: 89 mg/dL (ref 70–99)
Potassium: 3.7 mmol/L (ref 3.5–5.1)
Sodium: 141 mmol/L (ref 135–145)
Total Bilirubin: 0.3 mg/dL (ref 0.3–1.2)
Total Protein: 6.8 g/dL (ref 6.5–8.1)

## 2021-01-19 NOTE — Progress Notes (Signed)
[facility] [phone]   [phone]  OFFICE PROGRESS NOTE  Celene Squibb, MD [address] [place] [address]  DIAGNOSIS: Stage IV (T3, N2, M1a) non-small cell lung cancer, poorly differentiated adenocarcinoma presented with large left upper lobe lung mass in addition to mediastinal lymphadenopathy and bilateral pulmonary nodules diagnosed in June 2021.  Biomarker Findings Tumor Mutational Burden - 10 Muts/Mb Microsatellite status - MS-Stable Genomic Findings For a complete list of the genes assayed, please refer to the Appendix. NF1 Q2582* KRAS G12V CTNNB1 A663fs*10 TP53 H179N 7 Disease relevant genes with no reportable alterations: ALK, BRAF, EGFR, ERBB2, MET, RET, ROS1  PDL1 Expression: 100%  PRIOR THERAPY: Palliative radiotherapy to the large left upper lobe lung mass.  CURRENT THERAPY: Systemic chemotherapy with carboplatin for AUC of 5, Alimta 500 mg/M2 and Keytruda 200 mg IV every 3 weeks.  First dose 02/12/2020.  Status post 11 cycles.  Starting from cycle #5 she was on maintenance treatment with Alimta and Keytruda every 3 weeks.  Starting cycle #9 she will be on single agent Keytruda every 3 weeks.  INTERVAL HISTORY: Kayla Sawyer 75 y.o. female returns to the clinic today for follow-up visit accompanied by her husband.  The patient is feeling fine today with no concerning complaints except for development of rash again on the palms and legs.  She was seen by her dermatologist and started on hydrocortisone cream with some improvement.  She denied having any current chest pain, shortness of breath, cough or hemoptysis.  She denied having any fever or chills.  She has no nausea, vomiting, diarrhea or constipation.  She has no headache or visual changes.  She is thinking about stopping her treatment.   MEDICAL HISTORY: Past Medical History:  Diagnosis Date   Arthritis    COPD (chronic obstructive pulmonary disease) ([facility])     Depression    E. coli pyelonephritis 6//2014   GERD (gastroesophageal reflux disease)    Pneumonia    Pulmonary hypertension ([facility])    Sepsis ([facility])     ALLERGIES:  is allergic to elemental sulfur.  MEDICATIONS:  Current Outpatient Medications  Medication Sig Dispense Refill   acetaminophen (TYLENOL) 500 MG tablet Take 500 mg by mouth every 6 (six) hours as needed for mild pain.     albuterol (PROVENTIL) (2.5 MG/3ML) 0.083% nebulizer solution Take 3 mLs (2.5 mg total) by nebulization every 6 (six) hours as needed for wheezing or shortness of breath. 75 mL 12   aspirin EC 81 MG tablet Take 1 tablet (81 mg total) by mouth daily.     atorvastatin (LIPITOR) 40 MG tablet Take 40 mg by mouth daily.     folic acid (FOLVITE) 1 MG tablet Take 1 tablet (1 mg total) by mouth daily. 30 tablet 4   levocetirizine (XYZAL) 5 MG tablet Take 5 mg by mouth every evening.      lidocaine-prilocaine (EMLA) cream Apply to the Port-A-Cath site 30-60-minute before chemotherapy every 3 weeks. 30 g 0   Multiple Vitamin (MULTIVITAMIN WITH MINERALS) TABS tablet Take 1 tablet by mouth daily.     mupirocin ointment (BACTROBAN) 2 % SMARTSIG:1 Application Topical 2-3 Times Daily     omeprazole (PRILOSEC) 20 MG capsule Take 20 mg by mouth daily.      prochlorperazine (COMPAZINE) 10 MG tablet Take 1 tablet (10 mg total) by mouth every 6 (six) hours as needed for nausea or vomiting. 30 tablet 0  sertraline (ZOLOFT) 100 MG tablet Take 100 mg by mouth daily.      TRELEGY ELLIPTA 100-62.5-25 MCG/INH AEPB Inhale 1 puff into the lungs daily.     No current facility-administered medications for this visit.   Facility-Administered Medications Ordered in Other Visits  Medication Dose Route Frequency Provider Last Rate Last Admin   sodium chloride flush (NS) 0.9 % injection 10 mL  10 mL Intravenous PRN Curt Bears, MD        SURGICAL HISTORY:  Past Surgical History:  Procedure Laterality Date   ABDOMINAL HYSTERECTOMY      partial   AORTA - BILATERAL FEMORAL ARTERY BYPASS GRAFT Bilateral 06/14/2016   Procedure: AORTOBIFEMORAL BYPASS GRAFT AORTA SMA BYPASS GRAFT RE-IMPLANTATION  OF SUPERIOR MESENTERIC ARTERY;  Surgeon: Serafina Mitchell, MD;  Location: [facility];  Service: Vascular;  Laterality: Bilateral;   BACK SURGERY     L4/5   CARDIAC CATHETERIZATION N/A 01/03/2016   Procedure: Right/Left Heart Cath and Coronary Angiography;  Surgeon: Jolaine Artist, MD;  Location: [facility];  Service: Cardiovascular;  Laterality: N/A;   COLONOSCOPY N/A 08/05/2015   Procedure: COLONOSCOPY;  Surgeon: Rogene Houston, MD;  [facility]: [facility];  Service: Endoscopy;  Laterality: N/A;  730   ELBOW SURGERY     IR IMAGING GUIDED PORT INSERTION  02/16/2020   PERIPHERAL VASCULAR CATHETERIZATION N/A 02/16/2016   Procedure: Abdominal Aortogram w/Lower Extremity;  Surgeon: Wellington Hampshire, MD;  Location: [facility];  Service: Cardiovascular;  Laterality: N/A;    REVIEW OF SYSTEMS:  A comprehensive review of systems was negative except for: Constitutional: positive for fatigue Integument/breast: positive for dryness and rash   PHYSICAL EXAMINATION: General appearance: alert, cooperative, fatigued, and no distress Head: Normocephalic, without obvious abnormality, atraumatic Neck: no adenopathy, no JVD, supple, symmetrical, trachea midline, and thyroid not enlarged, symmetric, no tenderness/mass/nodules Lymph nodes: Cervical, supraclavicular, and axillary nodes normal. Resp: clear to auscultation bilaterally Back: symmetric, no curvature. ROM normal. No CVA tenderness. Cardio: regular rate and rhythm, S1, S2 normal, no murmur, click, rub or gallop GI: soft, non-tender; bowel sounds normal; no masses,  no organomegaly Extremities: extremities normal, atraumatic, no cyanosis or edema    ECOG PERFORMANCE STATUS: 1 - Symptomatic but completely ambulatory  Blood pressure (!) 110/58, pulse 84, temperature (!) 96.1 F  (35.6 C), temperature source Tympanic, resp. rate 18, weight 137 lb 1 oz (62.2 kg), SpO2 94 %.  LABORATORY DATA: Lab Results  Component Value Date   WBC 6.2 01/19/2021   HGB 11.0 (L) 01/19/2021   HCT 33.6 (L) 01/19/2021   MCV 92.3 01/19/2021   PLT 199 01/19/2021      Chemistry      Component Value Date/Time   NA 142 12/29/2020 1217   K 4.0 12/29/2020 1217   CL 108 12/29/2020 1217   CO2 27 12/29/2020 1217   BUN 29 (H) 12/29/2020 1217   CREATININE 1.43 (H) 12/29/2020 1217   CREATININE 0.90 02/08/2016 0949      Component Value Date/Time   CALCIUM 9.3 12/29/2020 1217   ALKPHOS 50 12/29/2020 1217   AST 10 (L) 12/29/2020 1217   ALT 5 12/29/2020 1217   BILITOT 0.3 12/29/2020 1217       RADIOGRAPHIC STUDIES: No results found.   ASSESSMENT AND PLAN: This is a very pleasant 75 years old white female with a stage IV (T3, N2, M1a) non-small cell lung cancer, poorly differentiated adenocarcinoma diagnosed in June 2021 and presented with  large left upper lobe lung mass with mediastinal invasion in addition to mediastinal lymphadenopathy as well as bilateral pulmonary nodules. The patient has no actionable mutations but PD-L1 expression is 100%. Because of the bulky disease I recommended for the patient to consider the combination chemotherapy.  She is interested in this option. She is currently being treated with carboplatin for AUC of 5, Alimta 500 mg/M2 and Keytruda 200 mg IV every 3 weeks status post 11 cycles.  Starting from cycle #5 she is on maintenance treatment with Alimta and Keytruda every 3 weeks.  Starting from cycle #9 she is on treatment with single agent Keytruda.  The patient had a break off treatment for few months after cycle #9 but she had some evidence for disease progression and she resumed her treatment. The patient tolerated the last cycle of her treatment well except for the development of skin rash again.  She decided not to proceed with any further treatment with  immunotherapy. I will arrange for her to have repeat CT scan of the chest, abdomen and pelvis in 1 months for restaging of her disease before consideration of treatment with chemotherapy or discussion of other treatment options. The patient will continue with the hydrocortisone cream for the skin rash. She was advised to call immediately if she has any other concerning symptoms in the interval. The patient voices understanding of current disease status and treatment options and is in agreement with the current care plan. All questions were answered. The patient knows to call the clinic with any problems, questions or concerns. We can certainly see the patient much sooner if necessary.  Disclaimer: This note was dictated with voice recognition software. Similar sounding words can inadvertently be transcribed and may not be corrected upon review.

## 2021-01-24 DIAGNOSIS — M6281 Muscle weakness (generalized): Secondary | ICD-10-CM | POA: Diagnosis not present

## 2021-01-24 DIAGNOSIS — C3490 Malignant neoplasm of unspecified part of unspecified bronchus or lung: Secondary | ICD-10-CM | POA: Diagnosis not present

## 2021-01-28 ENCOUNTER — Other Ambulatory Visit: Payer: Self-pay | Admitting: Internal Medicine

## 2021-01-28 ENCOUNTER — Telehealth: Payer: Self-pay

## 2021-01-28 MED ORDER — METHYLPREDNISOLONE 4 MG PO TBPK: ORAL_TABLET | ORAL | 0 refills | Status: DC

## 2021-01-28 NOTE — Telephone Encounter (Signed)
Pts husband called stating pt never received the refill of prednisone and her hands and legs are still burning.

## 2021-01-28 NOTE — Telephone Encounter (Signed)
Rx has been sent and I have spoken with the pt and advised of this. She expressed understanding of the information.

## 2021-02-01 ENCOUNTER — Telehealth: Payer: Self-pay | Admitting: Internal Medicine

## 2021-02-01 NOTE — Telephone Encounter (Signed)
Scheduled per 07/06 los, patient has been called and voicemail was left.

## 2021-02-04 ENCOUNTER — Other Ambulatory Visit: Payer: Medicare Other

## 2021-02-08 ENCOUNTER — Ambulatory Visit: Payer: Medicare Other

## 2021-02-08 ENCOUNTER — Ambulatory Visit: Payer: Medicare Other | Admitting: Internal Medicine

## 2021-02-08 ENCOUNTER — Other Ambulatory Visit: Payer: Medicare Other

## 2021-02-13 DIAGNOSIS — J449 Chronic obstructive pulmonary disease, unspecified: Secondary | ICD-10-CM | POA: Diagnosis not present

## 2021-02-21 ENCOUNTER — Inpatient Hospital Stay: Payer: Medicare Other | Attending: Internal Medicine

## 2021-02-21 ENCOUNTER — Ambulatory Visit (HOSPITAL_COMMUNITY)
Admission: RE | Admit: 2021-02-21 | Discharge: 2021-02-21 | Disposition: A | Discharge status: Home / Self Care | Payer: Medicare Other | Source: Ambulatory Visit | Attending: Internal Medicine | Admitting: Internal Medicine

## 2021-02-21 ENCOUNTER — Other Ambulatory Visit: Payer: Self-pay

## 2021-02-21 DIAGNOSIS — C3412 Malignant neoplasm of upper lobe, left bronchus or lung: Secondary | ICD-10-CM | POA: Diagnosis not present

## 2021-02-21 DIAGNOSIS — C3490 Malignant neoplasm of unspecified part of unspecified bronchus or lung: Secondary | ICD-10-CM | POA: Insufficient documentation

## 2021-02-21 LAB — CBC WITH DIFFERENTIAL (CANCER CENTER ONLY)
Abs Immature Granulocytes: 0.02 10*3/uL (ref 0.00–0.07)
Basophils Absolute: 0 10*3/uL (ref 0.0–0.1)
Basophils Relative: 1 %
Eosinophils Absolute: 0.1 10*3/uL (ref 0.0–0.5)
Eosinophils Relative: 1 %
HCT: 36.1 % (ref 36.0–46.0)
Hemoglobin: 11.7 g/dL — ABNORMAL LOW (ref 12.0–15.0)
Immature Granulocytes: 0 %
Lymphocytes Relative: 16 %
Lymphs Abs: 1 10*3/uL (ref 0.7–4.0)
MCH: 30 pg (ref 26.0–34.0)
MCHC: 32.4 g/dL (ref 30.0–36.0)
MCV: 92.6 fL (ref 80.0–100.0)
Monocytes Absolute: 0.7 10*3/uL (ref 0.1–1.0)
Monocytes Relative: 11 %
Neutro Abs: 4.4 10*3/uL (ref 1.7–7.7)
Neutrophils Relative %: 71 %
Platelet Count: 188 10*3/uL (ref 150–400)
RBC: 3.9 MIL/uL (ref 3.87–5.11)
RDW: 14.4 % (ref 11.5–15.5)
WBC Count: 6.2 10*3/uL (ref 4.0–10.5)
nRBC: 0 % (ref 0.0–0.2)

## 2021-02-21 LAB — CMP (CANCER CENTER ONLY)
ALT: 9 U/L (ref 0–44)
AST: 13 U/L — ABNORMAL LOW (ref 15–41)
Albumin: 3.5 g/dL (ref 3.5–5.0)
Alkaline Phosphatase: 60 U/L (ref 38–126)
Anion gap: 10 (ref 5–15)
BUN: 20 mg/dL (ref 8–23)
CO2: 24 mmol/L (ref 22–32)
Calcium: 9.4 mg/dL (ref 8.9–10.3)
Chloride: 105 mmol/L (ref 98–111)
Creatinine: 1.32 mg/dL — ABNORMAL HIGH (ref 0.44–1.00)
GFR, Estimated: 42 mL/min — ABNORMAL LOW (ref >60)
Glucose, Bld: 90 mg/dL (ref 70–99)
Potassium: 4 mmol/L (ref 3.5–5.1)
Sodium: 139 mmol/L (ref 135–145)
Total Bilirubin: 0.3 mg/dL (ref 0.3–1.2)
Total Protein: 6.6 g/dL (ref 6.5–8.1)

## 2021-02-23 ENCOUNTER — Encounter: Payer: Self-pay | Admitting: Internal Medicine

## 2021-02-23 ENCOUNTER — Other Ambulatory Visit: Payer: Self-pay

## 2021-02-23 ENCOUNTER — Inpatient Hospital Stay (HOSPITAL_BASED_OUTPATIENT_CLINIC_OR_DEPARTMENT_OTHER): Payer: Medicare Other | Admitting: Internal Medicine

## 2021-02-23 VITALS — BP 93/55 | HR 117 | Temp 97.9°F | Resp 20 | Ht 68.0 in | Wt 137.0 lb

## 2021-02-23 DIAGNOSIS — C3492 Malignant neoplasm of unspecified part of left bronchus or lung: Secondary | ICD-10-CM

## 2021-02-23 DIAGNOSIS — C3412 Malignant neoplasm of upper lobe, left bronchus or lung: Secondary | ICD-10-CM | POA: Diagnosis not present

## 2021-02-23 DIAGNOSIS — C3490 Malignant neoplasm of unspecified part of unspecified bronchus or lung: Secondary | ICD-10-CM

## 2021-02-23 DIAGNOSIS — L270 Generalized skin eruption due to drugs and medicaments taken internally: Secondary | ICD-10-CM | POA: Diagnosis not present

## 2021-02-23 MED ORDER — PREDNISONE 50 MG PO TABS: ORAL_TABLET | ORAL | 0 refills | Status: DC

## 2021-02-23 NOTE — Progress Notes (Signed)
[name] Telephone:([phone]) [phone]   Fax:(336) [phone]  OFFICE PROGRESS NOTE  Celene Squibb, MD [address] [place] [address]  DIAGNOSIS: Stage IV (T3, N2, M1a) non-small cell lung cancer, poorly differentiated adenocarcinoma presented with large left upper lobe lung mass in addition to mediastinal lymphadenopathy and bilateral pulmonary nodules diagnosed in June 2021.  Biomarker Findings Tumor Mutational Burden - 10 Muts/Mb Microsatellite status - MS-Stable Genomic Findings For a complete list of the genes assayed, please refer to the Appendix. NF1 Q2582* KRAS G12V CTNNB1 A674fs*10 TP53 H179N 7 Disease relevant genes with no reportable alterations: ALK, BRAF, EGFR, ERBB2, MET, RET, ROS1  PDL1 Expression: 100%  PRIOR THERAPY: Palliative radiotherapy to the large left upper lobe lung mass.  CURRENT THERAPY: Systemic chemotherapy with carboplatin for AUC of 5, Alimta 500 mg/M2 and Keytruda 200 mg IV every 3 weeks.  First dose 02/12/2020.  Status post 11 cycles.  Starting from cycle #5 she was on maintenance treatment with Alimta and Keytruda every 3 weeks.  Starting cycle #9 she will be on single agent Keytruda every 3 weeks.  Her treatment is currently on hold secondary to significant skin rash.  INTERVAL HISTORY: Kayla Sawyer 75 y.o. female returns to the clinic today for follow-up visit accompanied by her husband.  The patient is feeling fine today with no concerning complaints except for the baseline shortness of breath and she is currently on home oxygen.  She also continues to have residual skin rash especially on the lower extremities that has not improved for the last several weeks.  She has no current chest pain, cough or hemoptysis.  She denied having any nausea, vomiting, diarrhea or constipation.  She has no headache or visual changes.  She has no weight loss or night sweats.  Her treatment with Beryle Flock has been on hold for the last 2 months  secondary to recurrent skin rash and itching and the patient was not interested in continuing with the treatment. She had repeat CT scan of the chest, abdomen and pelvis performed recently and she is here for evaluation and discussion of her risk her results.   MEDICAL HISTORY: Past Medical History:  Diagnosis Date   Arthritis    COPD (chronic obstructive pulmonary disease) ([facility])    Depression    E. coli pyelonephritis 6//2014   GERD (gastroesophageal reflux disease)    Pneumonia    Pulmonary hypertension ([facility])    Sepsis ([facility])     ALLERGIES:  is allergic to elemental sulfur.  MEDICATIONS:  Current Outpatient Medications  Medication Sig Dispense Refill   acetaminophen (TYLENOL) 500 MG tablet Take 500 mg by mouth every 6 (six) hours as needed for mild pain.     albuterol (PROVENTIL) (2.5 MG/3ML) 0.083% nebulizer solution Take 3 mLs (2.5 mg total) by nebulization every 6 (six) hours as needed for wheezing or shortness of breath. 75 mL 12   aspirin EC 81 MG tablet Take 1 tablet (81 mg total) by mouth daily.     atorvastatin (LIPITOR) 40 MG tablet Take 40 mg by mouth daily.     betamethasone dipropionate 0.05 % cream Apply topically.     fluticasone (CUTIVATE) 0.05 % cream Apply topically.     folic acid (FOLVITE) 1 MG tablet Take 1 tablet (1 mg total) by mouth daily. 30 tablet 4   ketoconazole (NIZORAL) 2 % cream Apply topically.     levocetirizine (XYZAL) 5 MG tablet Take 5 mg  by mouth every evening.      lidocaine-prilocaine (EMLA) cream Apply to the Port-A-Cath site 30-60-minute before chemotherapy every 3 weeks. 30 g 0   methylPREDNISolone (MEDROL DOSEPAK) 4 MG TBPK tablet Use as instructed. 21 tablet 0   Multiple Vitamin (MULTIVITAMIN WITH MINERALS) TABS tablet Take 1 tablet by mouth daily.     mupirocin ointment (BACTROBAN) 2 % SMARTSIG:1 Application Topical 2-3 Times Daily     MYRBETRIQ 25 MG TB24 tablet Take 25 mg by mouth daily.     omeprazole (PRILOSEC) 20 MG capsule Take 20  mg by mouth daily.      prochlorperazine (COMPAZINE) 10 MG tablet Take 1 tablet (10 mg total) by mouth every 6 (six) hours as needed for nausea or vomiting. 30 tablet 0   sertraline (ZOLOFT) 100 MG tablet Take 100 mg by mouth daily.      TRELEGY ELLIPTA 100-62.5-25 MCG/INH AEPB Inhale 1 puff into the lungs daily.     No current facility-administered medications for this visit.   Facility-Administered Medications Ordered in Other Visits  Medication Dose Route Frequency Provider Last Rate Last Admin   sodium chloride flush (NS) 0.9 % injection 10 mL  10 mL Intravenous PRN Curt Bears, MD        SURGICAL HISTORY:  Past Surgical History:  Procedure Laterality Date   ABDOMINAL HYSTERECTOMY     partial   AORTA - BILATERAL FEMORAL ARTERY BYPASS GRAFT Bilateral 06/14/2016   Procedure: AORTOBIFEMORAL BYPASS GRAFT AORTA SMA BYPASS GRAFT RE-IMPLANTATION  OF SUPERIOR MESENTERIC ARTERY;  Surgeon: Serafina Mitchell, MD;  Location: [facility];  Service: Vascular;  Laterality: Bilateral;   BACK SURGERY     L4/5   CARDIAC CATHETERIZATION N/A 01/03/2016   Procedure: Right/Left Heart Cath and Coronary Angiography;  Surgeon: Jolaine Artist, MD;  Location: [facility];  Service: Cardiovascular;  Laterality: N/A;   COLONOSCOPY N/A 08/05/2015   Procedure: COLONOSCOPY;  Surgeon: Rogene Houston, MD;  Location: [facility];  Service: Endoscopy;  Laterality: N/A;  730   ELBOW SURGERY     IR IMAGING GUIDED PORT INSERTION  02/16/2020   PERIPHERAL VASCULAR CATHETERIZATION N/A 02/16/2016   Procedure: Abdominal Aortogram w/Lower Extremity;  Surgeon: Wellington Hampshire, MD;  Location: [facility];  Service: Cardiovascular;  Laterality: N/A;    REVIEW OF SYSTEMS:  Constitutional: positive for fatigue Eyes: negative Ears, nose, mouth, throat, and face: negative Respiratory: positive for dyspnea on exertion Cardiovascular: negative Gastrointestinal: negative Genitourinary:negative Integument/breast:  positive for pruritus and rash Hematologic/lymphatic: negative Musculoskeletal:negative Neurological: negative Behavioral/Psych: negative Endocrine: negative Allergic/Immunologic: negative   PHYSICAL EXAMINATION: General appearance: alert, cooperative, fatigued, and no distress Head: Normocephalic, without obvious abnormality, atraumatic Neck: no adenopathy, no JVD, supple, symmetrical, trachea midline, and thyroid not enlarged, symmetric, no tenderness/mass/nodules Lymph nodes: Cervical, supraclavicular, and axillary nodes normal. Resp: clear to auscultation bilaterally Back: symmetric, no curvature. ROM normal. No CVA tenderness. Cardio: regular rate and rhythm, S1, S2 normal, no murmur, click, rub or gallop GI: soft, non-tender; bowel sounds normal; no masses,  no organomegaly Extremities: extremities normal, atraumatic, no cyanosis or edema Neurologic: Alert and oriented X 3, normal strength and tone. Normal symmetric reflexes. Normal coordination and gait      ECOG PERFORMANCE STATUS: 1 - Symptomatic but completely ambulatory  Blood pressure (!) 93/55, pulse (!) 117, temperature 97.9 F (36.6 C), temperature source Tympanic, resp. rate 20, height $RemoveBe'5\' 8"'irPbnjRIA$  (1.727 m), weight 137 lb (62.1 kg), SpO2 90 %.  LABORATORY  DATA: Lab Results  Component Value Date   WBC 6.2 02/21/2021   HGB 11.7 (L) 02/21/2021   HCT 36.1 02/21/2021   MCV 92.6 02/21/2021   PLT 188 02/21/2021      Chemistry      Component Value Date/Time   NA 139 02/21/2021 1129   K 4.0 02/21/2021 1129   CL 105 02/21/2021 1129   CO2 24 02/21/2021 1129   BUN 20 02/21/2021 1129   CREATININE 1.32 (H) 02/21/2021 1129   CREATININE 0.90 02/08/2016 0949      Component Value Date/Time   CALCIUM 9.4 02/21/2021 1129   ALKPHOS 60 02/21/2021 1129   AST 13 (L) 02/21/2021 1129   ALT 9 02/21/2021 1129   BILITOT 0.3 02/21/2021 1129       RADIOGRAPHIC STUDIES: CT Abdomen Pelvis Wo Contrast  Result Date:  02/22/2021 CLINICAL DATA:  Non-small cell lung cancer restaging, XRT complete, ongoing chemotherapy, smoker EXAM: CT CHEST, ABDOMEN AND PELVIS WITHOUT CONTRAST TECHNIQUE: Multidetector CT imaging of the chest, abdomen and pelvis was performed following the standard protocol without IV contrast. Oral enteric contrast was administered. COMPARISON:  12/10/2020 FINDINGS: CT CHEST FINDINGS Cardiovascular: Right chest port catheter. Aortic atherosclerosis. Normal heart size. Three-vessel coronary artery calcifications. Unchanged small pericardial effusion. Pericardial effusion. Mediastinum/Nodes: Unchanged post treatment appearance of soft tissue about the left hilum (series 4, image 74). No discretely enlarged mediastinal, hilar, or axillary lymph nodes. Thyroid gland, trachea, and esophagus demonstrate no significant findings. Lungs/Pleura: Severe centrilobular emphysema. Diffuse bilateral bronchial wall thickening. There has been an interval increase in fibrosis and volume loss of the anterior left upper lobe (series 4, image 64). A previously noted masslike region at the lateral aspect of this consolidation is significantly less conspicuous on this examination and diminished in size, measuring no greater than 2.7 x 2.0 cm, previously 4.2 x 3.4 cm when measured similarly (series 4, image 62). No pleural effusion or pneumothorax. Musculoskeletal: No chest wall mass or suspicious bone lesions identified. CT ABDOMEN PELVIS FINDINGS Hepatobiliary: No solid liver abnormality is seen. No gallstones, gallbladder wall thickening, or biliary dilatation. Pancreas: Unremarkable. No pancreatic ductal dilatation or surrounding inflammatory changes. Spleen: Normal in size without significant abnormality. Adrenals/Urinary Tract: Adrenal glands are unremarkable. Kidneys are normal, without renal calculi, solid lesion, or hydronephrosis. Bladder is unremarkable. Stomach/Bowel: Stomach is within normal limits. Appendix appears normal. No  evidence of bowel wall thickening, distention, or inflammatory changes. Vascular/Lymphatic: Aortic atherosclerosis. Status post aortobifemoral bypass graft repair of the abdominal aorta. No enlarged abdominal or pelvic lymph nodes. Reproductive: Status post hysterectomy. Other: No abdominal wall hernia or abnormality. No abdominopelvic ascites. Musculoskeletal: No acute or significant osseous findings. IMPRESSION: 1. There has been an interval increase in fibrosis and volume loss of the anterior left upper lobe. A previously noted masslike region at the lateral aspect of this consolidation is significantly less conspicuous on this examination and diminished in size. Findings are generally consistent with expected evolution of radiation fibrosis. Continued attention on follow-up. 2. Unchanged post treatment appearance of soft tissue about the left hilum. No discretely enlarged mediastinal, hilar, or axillary lymph nodes. 3. No noncontrast evidence of metastatic disease in the abdomen or pelvis. 4. Severe emphysema. 5. Coronary artery disease. 6. Status post aortobifemoral graft repair. Aortic Atherosclerosis (ICD10-I70.0) and Emphysema (ICD10-J43.9). Electronically Signed   By: Eddie Candle M.D.   On: 02/22/2021 12:08   CT Chest Wo Contrast  Result Date: 02/22/2021 CLINICAL DATA:  Non-small cell lung cancer restaging, XRT complete, ongoing  chemotherapy, smoker EXAM: CT CHEST, ABDOMEN AND PELVIS WITHOUT CONTRAST TECHNIQUE: Multidetector CT imaging of the chest, abdomen and pelvis was performed following the standard protocol without IV contrast. Oral enteric contrast was administered. COMPARISON:  12/10/2020 FINDINGS: CT CHEST FINDINGS Cardiovascular: Right chest port catheter. Aortic atherosclerosis. Normal heart size. Three-vessel coronary artery calcifications. Unchanged small pericardial effusion. Pericardial effusion. Mediastinum/Nodes: Unchanged post treatment appearance of soft tissue about the left hilum  (series 4, image 74). No discretely enlarged mediastinal, hilar, or axillary lymph nodes. Thyroid gland, trachea, and esophagus demonstrate no significant findings. Lungs/Pleura: Severe centrilobular emphysema. Diffuse bilateral bronchial wall thickening. There has been an interval increase in fibrosis and volume loss of the anterior left upper lobe (series 4, image 64). A previously noted masslike region at the lateral aspect of this consolidation is significantly less conspicuous on this examination and diminished in size, measuring no greater than 2.7 x 2.0 cm, previously 4.2 x 3.4 cm when measured similarly (series 4, image 62). No pleural effusion or pneumothorax. Musculoskeletal: No chest wall mass or suspicious bone lesions identified. CT ABDOMEN PELVIS FINDINGS Hepatobiliary: No solid liver abnormality is seen. No gallstones, gallbladder wall thickening, or biliary dilatation. Pancreas: Unremarkable. No pancreatic ductal dilatation or surrounding inflammatory changes. Spleen: Normal in size without significant abnormality. Adrenals/Urinary Tract: Adrenal glands are unremarkable. Kidneys are normal, without renal calculi, solid lesion, or hydronephrosis. Bladder is unremarkable. Stomach/Bowel: Stomach is within normal limits. Appendix appears normal. No evidence of bowel wall thickening, distention, or inflammatory changes. Vascular/Lymphatic: Aortic atherosclerosis. Status post aortobifemoral bypass graft repair of the abdominal aorta. No enlarged abdominal or pelvic lymph nodes. Reproductive: Status post hysterectomy. Other: No abdominal wall hernia or abnormality. No abdominopelvic ascites. Musculoskeletal: No acute or significant osseous findings. IMPRESSION: 1. There has been an interval increase in fibrosis and volume loss of the anterior left upper lobe. A previously noted masslike region at the lateral aspect of this consolidation is significantly less conspicuous on this examination and diminished in  size. Findings are generally consistent with expected evolution of radiation fibrosis. Continued attention on follow-up. 2. Unchanged post treatment appearance of soft tissue about the left hilum. No discretely enlarged mediastinal, hilar, or axillary lymph nodes. 3. No noncontrast evidence of metastatic disease in the abdomen or pelvis. 4. Severe emphysema. 5. Coronary artery disease. 6. Status post aortobifemoral graft repair. Aortic Atherosclerosis (ICD10-I70.0) and Emphysema (ICD10-J43.9). Electronically Signed   By: Eddie Candle M.D.   On: 02/22/2021 12:08     ASSESSMENT AND PLAN: This is a very pleasant 75 years old white female with a stage IV (T3, N2, M1a) non-small cell lung cancer, poorly differentiated adenocarcinoma diagnosed in June 2021 and presented with large left upper lobe lung mass with mediastinal invasion in addition to mediastinal lymphadenopathy as well as bilateral pulmonary nodules. The patient has no actionable mutations but PD-L1 expression is 100%. Because of the bulky disease I recommended for the patient to consider the combination chemotherapy.  She is interested in this option. She is currently being treated with carboplatin for AUC of 5, Alimta 500 mg/M2 and Keytruda 200 mg IV every 3 weeks status post 11 cycles.  Starting from cycle #5 she is on maintenance treatment with Alimta and Keytruda every 3 weeks.  Starting from cycle #9 she is on treatment with single agent Keytruda.  The patient had a break off treatment for few months after cycle #9 but she had some evidence for disease progression and she resumed her treatment. The patient tolerated the  last cycle of her treatment well except for the recurrence of skin rash and itching again.  She decided not to proceed with any further treatment with immunotherapy. She has been in observation for the last 2 months. The patient had repeat CT scan of the chest, abdomen pelvis performed recently.  I personally and independently  reviewed the scans and discussed the results with the patient and her husband today. Her scan showed no concerning findings for disease progression. I recommended for her to continue on observation with repeat CT scan of the chest, abdomen and pelvis in 3 months. For the recurrent skin rash and itching, I will start the patient on high-dose steroids to be tapered over the next several weeks.  She did have good response to this treatment in the past.  If no improvement, I will refer her to dermatology for evaluation. The patient was advised to call immediately if she has any concerning symptoms in the interval. The patient voices understanding of current disease status and treatment options and is in agreement with the current care plan. All questions were answered. The patient knows to call the clinic with any problems, questions or concerns. We can certainly see the patient much sooner if necessary.  Disclaimer: This note was dictated with voice recognition software. Similar sounding words can inadvertently be transcribed and may not be corrected upon review.

## 2021-02-24 ENCOUNTER — Other Ambulatory Visit: Payer: Self-pay | Admitting: Medical Oncology

## 2021-02-24 DIAGNOSIS — C3492 Malignant neoplasm of unspecified part of left bronchus or lung: Secondary | ICD-10-CM

## 2021-02-24 DIAGNOSIS — L270 Generalized skin eruption due to drugs and medicaments taken internally: Secondary | ICD-10-CM

## 2021-02-24 MED ORDER — PREDNISONE 10 MG PO TABS: ORAL_TABLET | ORAL | 0 refills | Status: DC

## 2021-03-11 DIAGNOSIS — R6889 Other general symptoms and signs: Secondary | ICD-10-CM | POA: Diagnosis not present

## 2021-03-16 DIAGNOSIS — K219 Gastro-esophageal reflux disease without esophagitis: Secondary | ICD-10-CM | POA: Diagnosis not present

## 2021-03-16 DIAGNOSIS — J449 Chronic obstructive pulmonary disease, unspecified: Secondary | ICD-10-CM | POA: Diagnosis not present

## 2021-03-17 DIAGNOSIS — E782 Mixed hyperlipidemia: Secondary | ICD-10-CM | POA: Diagnosis not present

## 2021-03-17 DIAGNOSIS — R7301 Impaired fasting glucose: Secondary | ICD-10-CM | POA: Diagnosis not present

## 2021-03-23 DIAGNOSIS — K219 Gastro-esophageal reflux disease without esophagitis: Secondary | ICD-10-CM | POA: Diagnosis not present

## 2021-03-23 DIAGNOSIS — R7303 Prediabetes: Secondary | ICD-10-CM | POA: Diagnosis not present

## 2021-03-23 DIAGNOSIS — F17219 Nicotine dependence, cigarettes, with unspecified nicotine-induced disorders: Secondary | ICD-10-CM | POA: Diagnosis not present

## 2021-03-23 DIAGNOSIS — J9611 Chronic respiratory failure with hypoxia: Secondary | ICD-10-CM | POA: Diagnosis not present

## 2021-03-23 DIAGNOSIS — D6481 Anemia due to antineoplastic chemotherapy: Secondary | ICD-10-CM | POA: Diagnosis not present

## 2021-03-23 DIAGNOSIS — I739 Peripheral vascular disease, unspecified: Secondary | ICD-10-CM | POA: Diagnosis not present

## 2021-03-23 DIAGNOSIS — C3492 Malignant neoplasm of unspecified part of left bronchus or lung: Secondary | ICD-10-CM | POA: Diagnosis not present

## 2021-03-23 DIAGNOSIS — N1831 Chronic kidney disease, stage 3a: Secondary | ICD-10-CM | POA: Diagnosis not present

## 2021-03-23 DIAGNOSIS — Z0001 Encounter for general adult medical examination with abnormal findings: Secondary | ICD-10-CM | POA: Diagnosis not present

## 2021-03-23 DIAGNOSIS — E782 Mixed hyperlipidemia: Secondary | ICD-10-CM | POA: Diagnosis not present

## 2021-03-23 DIAGNOSIS — J449 Chronic obstructive pulmonary disease, unspecified: Secondary | ICD-10-CM | POA: Diagnosis not present

## 2021-04-01 ENCOUNTER — Other Ambulatory Visit: Payer: Medicare Other

## 2021-04-15 DIAGNOSIS — J449 Chronic obstructive pulmonary disease, unspecified: Secondary | ICD-10-CM | POA: Diagnosis not present

## 2021-04-20 DIAGNOSIS — L308 Other specified dermatitis: Secondary | ICD-10-CM | POA: Diagnosis not present

## 2021-05-13 ENCOUNTER — Telehealth: Payer: Self-pay | Admitting: Internal Medicine

## 2021-05-13 NOTE — Telephone Encounter (Signed)
Sch per 10/27 los, pt aware

## 2021-05-16 DIAGNOSIS — J449 Chronic obstructive pulmonary disease, unspecified: Secondary | ICD-10-CM | POA: Diagnosis not present

## 2021-05-16 DIAGNOSIS — K219 Gastro-esophageal reflux disease without esophagitis: Secondary | ICD-10-CM | POA: Diagnosis not present

## 2021-05-24 ENCOUNTER — Other Ambulatory Visit (HOSPITAL_COMMUNITY): Payer: Self-pay

## 2021-05-24 ENCOUNTER — Inpatient Hospital Stay: Payer: Medicare Other

## 2021-05-24 DIAGNOSIS — C3490 Malignant neoplasm of unspecified part of unspecified bronchus or lung: Secondary | ICD-10-CM

## 2021-05-25 ENCOUNTER — Inpatient Hospital Stay (HOSPITAL_COMMUNITY): Payer: Medicare Other | Attending: Hematology

## 2021-05-25 ENCOUNTER — Other Ambulatory Visit: Payer: Self-pay

## 2021-05-25 DIAGNOSIS — C3490 Malignant neoplasm of unspecified part of unspecified bronchus or lung: Secondary | ICD-10-CM

## 2021-05-25 DIAGNOSIS — Z85118 Personal history of other malignant neoplasm of bronchus and lung: Secondary | ICD-10-CM | POA: Insufficient documentation

## 2021-05-25 LAB — CBC WITH DIFFERENTIAL/PLATELET
Abs Immature Granulocytes: 0.04 10*3/uL (ref 0.00–0.07)
Basophils Absolute: 0 10*3/uL (ref 0.0–0.1)
Basophils Relative: 1 %
Eosinophils Absolute: 0.1 10*3/uL (ref 0.0–0.5)
Eosinophils Relative: 2 %
HCT: 34.3 % — ABNORMAL LOW (ref 36.0–46.0)
Hemoglobin: 10.8 g/dL — ABNORMAL LOW (ref 12.0–15.0)
Immature Granulocytes: 1 %
Lymphocytes Relative: 26 %
Lymphs Abs: 1.5 10*3/uL (ref 0.7–4.0)
MCH: 29.6 pg (ref 26.0–34.0)
MCHC: 31.5 g/dL (ref 30.0–36.0)
MCV: 94 fL (ref 80.0–100.0)
Monocytes Absolute: 0.7 10*3/uL (ref 0.1–1.0)
Monocytes Relative: 12 %
Neutro Abs: 3.4 10*3/uL (ref 1.7–7.7)
Neutrophils Relative %: 58 %
Platelets: 179 10*3/uL (ref 150–400)
RBC: 3.65 MIL/uL — ABNORMAL LOW (ref 3.87–5.11)
RDW: 14.6 % (ref 11.5–15.5)
WBC: 5.8 10*3/uL (ref 4.0–10.5)
nRBC: 0 % (ref 0.0–0.2)

## 2021-05-25 LAB — COMPREHENSIVE METABOLIC PANEL
ALT: 10 U/L (ref 0–44)
AST: 15 U/L (ref 15–41)
Albumin: 3.4 g/dL — ABNORMAL LOW (ref 3.5–5.0)
Alkaline Phosphatase: 61 U/L (ref 38–126)
Anion gap: 7 (ref 5–15)
BUN: 24 mg/dL — ABNORMAL HIGH (ref 8–23)
CO2: 25 mmol/L (ref 22–32)
Calcium: 8.8 mg/dL — ABNORMAL LOW (ref 8.9–10.3)
Chloride: 106 mmol/L (ref 98–111)
Creatinine, Ser: 1.23 mg/dL — ABNORMAL HIGH (ref 0.44–1.00)
GFR, Estimated: 46 mL/min — ABNORMAL LOW (ref >60)
Glucose, Bld: 102 mg/dL — ABNORMAL HIGH (ref 70–99)
Potassium: 3.9 mmol/L (ref 3.5–5.1)
Sodium: 138 mmol/L (ref 135–145)
Total Bilirubin: 0.6 mg/dL (ref 0.3–1.2)
Total Protein: 6.2 g/dL — ABNORMAL LOW (ref 6.5–8.1)

## 2021-05-26 ENCOUNTER — Ambulatory Visit: Payer: Medicare Other | Admitting: Internal Medicine

## 2021-05-31 ENCOUNTER — Ambulatory Visit (HOSPITAL_COMMUNITY)
Admission: RE | Admit: 2021-05-31 | Discharge: 2021-05-31 | Disposition: A | Discharge status: Home / Self Care | Payer: Medicare Other | Source: Ambulatory Visit | Attending: Internal Medicine | Admitting: Internal Medicine

## 2021-05-31 ENCOUNTER — Other Ambulatory Visit: Payer: Self-pay

## 2021-05-31 DIAGNOSIS — C3490 Malignant neoplasm of unspecified part of unspecified bronchus or lung: Secondary | ICD-10-CM | POA: Insufficient documentation

## 2021-05-31 DIAGNOSIS — K5790 Diverticulosis of intestine, part unspecified, without perforation or abscess without bleeding: Secondary | ICD-10-CM | POA: Diagnosis not present

## 2021-05-31 DIAGNOSIS — J439 Emphysema, unspecified: Secondary | ICD-10-CM | POA: Diagnosis not present

## 2021-05-31 DIAGNOSIS — I700 Atherosclerosis of aorta: Secondary | ICD-10-CM | POA: Diagnosis not present

## 2021-05-31 MED ORDER — IOHEXOL 350 MG/ML SOLN
80.0000 mL | Freq: Once | INTRAVENOUS | Status: AC | PRN
  Administered 2021-05-31: 80 mL via INTRAVENOUS

## 2021-05-31 MED ORDER — SODIUM CHLORIDE (PF) 0.9 % IJ SOLN
INTRAMUSCULAR | Status: AC
  Filled 2021-05-31: qty 150

## 2021-06-02 ENCOUNTER — Inpatient Hospital Stay: Payer: Medicare Other | Attending: Internal Medicine | Admitting: Internal Medicine

## 2021-06-02 ENCOUNTER — Other Ambulatory Visit: Payer: Self-pay

## 2021-06-02 VITALS — BP 111/70 | HR 96 | Temp 97.1°F | Resp 18 | Ht 68.0 in | Wt 143.2 lb

## 2021-06-02 DIAGNOSIS — Z85118 Personal history of other malignant neoplasm of bronchus and lung: Secondary | ICD-10-CM | POA: Insufficient documentation

## 2021-06-02 DIAGNOSIS — C3492 Malignant neoplasm of unspecified part of left bronchus or lung: Secondary | ICD-10-CM

## 2021-06-02 DIAGNOSIS — C3490 Malignant neoplasm of unspecified part of unspecified bronchus or lung: Secondary | ICD-10-CM

## 2021-06-02 NOTE — Progress Notes (Signed)
[facility] [phone]   [phone]  OFFICE PROGRESS NOTE  Celene Squibb, MD [address] [place] [address]  DIAGNOSIS: Stage IV (T3, N2, M1a) non-small cell lung cancer, poorly differentiated adenocarcinoma presented with large left upper lobe lung mass in addition to mediastinal lymphadenopathy and bilateral pulmonary nodules diagnosed in June 2021.  Biomarker Findings Tumor Mutational Burden - 10 Muts/Mb Microsatellite status - MS-Stable Genomic Findings For a complete list of the genes assayed, please refer to the Appendix. NF1 Q2582* KRAS G12V CTNNB1 A648fs*10 TP53 H179N 7 Disease relevant genes with no reportable alterations: ALK, BRAF, EGFR, ERBB2, MET, RET, ROS1  PDL1 Expression: 100%  PRIOR THERAPY:  1) Palliative radiotherapy to the large left upper lobe lung mass. 2) Systemic chemotherapy with carboplatin for AUC of 5, Alimta 500 mg/M2 and Keytruda 200 mg IV every 3 weeks.  First dose 02/12/2020.  Status post 11 cycles.  Starting from cycle #5 she was on maintenance treatment with Alimta and Keytruda every 3 weeks.  Starting cycle #9 she will be on single agent Keytruda every 3 weeks.  Her treatment is currently on hold secondary to significant skin rash.  CURRENT THERAPY: Observation.  INTERVAL HISTORY: Kayla Sawyer 75 y.o. female returns to the clinic today for follow-up visit accompanied by her husband.  The patient is feeling fine today with no concerning complaints except for the baseline fatigue and shortness of breath.  She is currently on home oxygen.  She denied having any chest pain, cough or hemoptysis.  She denied having any fever or chills.  She has no nausea, vomiting, diarrhea or constipation.  The patient has been in observation for the last 5 months with no treatment.  She was seen by dermatology and given 7 injection not sure of the medication but she has improvement in her condition.  She is here today for  evaluation with repeat CT scan of the chest, abdomen and pelvis for restaging of her disease.   MEDICAL HISTORY: Past Medical History:  Diagnosis Date   Arthritis    COPD (chronic obstructive pulmonary disease) ([facility])    Depression    E. coli pyelonephritis 6//2014   GERD (gastroesophageal reflux disease)    Pneumonia    Pulmonary hypertension ([facility])    Sepsis ([facility])     ALLERGIES:  is allergic to elemental sulfur.  MEDICATIONS:  Current Outpatient Medications  Medication Sig Dispense Refill   acetaminophen (TYLENOL) 500 MG tablet Take 500 mg by mouth every 6 (six) hours as needed for mild pain.     albuterol (PROVENTIL) (2.5 MG/3ML) 0.083% nebulizer solution Take 3 mLs (2.5 mg total) by nebulization every 6 (six) hours as needed for wheezing or shortness of breath. 75 mL 12   aspirin EC 81 MG tablet Take 1 tablet (81 mg total) by mouth daily.     atorvastatin (LIPITOR) 40 MG tablet Take 40 mg by mouth daily.     betamethasone dipropionate 0.05 % cream Apply topically.     fluticasone (CUTIVATE) 0.05 % cream Apply topically.     folic acid (FOLVITE) 1 MG tablet Take 1 tablet (1 mg total) by mouth daily. 30 tablet 4   ketoconazole (NIZORAL) 2 % cream Apply topically.     levocetirizine (XYZAL) 5 MG tablet Take 5 mg by mouth every evening.      lidocaine-prilocaine (EMLA) cream Apply to the Port-A-Cath site 30-60-minute before chemotherapy every 3 weeks. 30 g 0  Multiple Vitamin (MULTIVITAMIN WITH MINERALS) TABS tablet Take 1 tablet by mouth daily.     mupirocin ointment (BACTROBAN) 2 % SMARTSIG:1 Application Topical 2-3 Times Daily     MYRBETRIQ 25 MG TB24 tablet Take 25 mg by mouth daily.     omeprazole (PRILOSEC) 20 MG capsule Take 20 mg by mouth daily.      sertraline (ZOLOFT) 100 MG tablet Take 100 mg by mouth daily.      TRELEGY ELLIPTA 100-62.5-25 MCG/INH AEPB Inhale 1 puff into the lungs daily.     prochlorperazine (COMPAZINE) 10 MG tablet Take 1 tablet (10 mg total) by mouth  every 6 (six) hours as needed for nausea or vomiting. (Patient not taking: Reported on 06/02/2021) 30 tablet 0   No current facility-administered medications for this visit.   Facility-Administered Medications Ordered in Other Visits  Medication Dose Route Frequency Provider Last Rate Last Admin   sodium chloride flush (NS) 0.9 % injection 10 mL  10 mL Intravenous PRN Curt Bears, MD        SURGICAL HISTORY:  Past Surgical History:  Procedure Laterality Date   ABDOMINAL HYSTERECTOMY     partial   AORTA - BILATERAL FEMORAL ARTERY BYPASS GRAFT Bilateral 06/14/2016   Procedure: AORTOBIFEMORAL BYPASS GRAFT AORTA SMA BYPASS GRAFT RE-IMPLANTATION  OF SUPERIOR MESENTERIC ARTERY;  Surgeon: Serafina Mitchell, MD;  Location: [facility];  Service: Vascular;  Laterality: Bilateral;   BACK SURGERY     L4/5   CARDIAC CATHETERIZATION N/A 01/03/2016   Procedure: Right/Left Heart Cath and Coronary Angiography;  Surgeon: Jolaine Artist, MD;  Location: [facility];  Service: Cardiovascular;  Laterality: N/A;   COLONOSCOPY N/A 08/05/2015   Procedure: COLONOSCOPY;  Surgeon: Rogene Houston, MD;  Location: [facility];  Service: Endoscopy;  Laterality: N/A;  730   ELBOW SURGERY     IR IMAGING GUIDED PORT INSERTION  02/16/2020   PERIPHERAL VASCULAR CATHETERIZATION N/A 02/16/2016   Procedure: Abdominal Aortogram w/Lower Extremity;  Surgeon: Wellington Hampshire, MD;  Location: [facility];  Service: Cardiovascular;  Laterality: N/A;    REVIEW OF SYSTEMS:  A comprehensive review of systems was negative except for: Constitutional: positive for fatigue Respiratory: positive for dyspnea on exertion   PHYSICAL EXAMINATION: General appearance: alert, cooperative, fatigued, and no distress Head: Normocephalic, without obvious abnormality, atraumatic Neck: no adenopathy, no JVD, supple, symmetrical, trachea midline, and thyroid not enlarged, symmetric, no tenderness/mass/nodules Lymph nodes: Cervical,  supraclavicular, and axillary nodes normal. Resp: clear to auscultation bilaterally Back: symmetric, no curvature. ROM normal. No CVA tenderness. Cardio: regular rate and rhythm, S1, S2 normal, no murmur, click, rub or gallop GI: soft, non-tender; bowel sounds normal; no masses,  no organomegaly Extremities: extremities normal, atraumatic, no cyanosis or edema    ECOG PERFORMANCE STATUS: 1 - Symptomatic but completely ambulatory  Blood pressure 111/70, pulse 96, temperature (!) 97.1 F (36.2 C), temperature source Tympanic, resp. rate 18, height $RemoveBe'5\' 8"'djbESvrlT$  (1.727 m), weight 143 lb 3.2 oz (65 kg), SpO2 94 %.  LABORATORY DATA: Lab Results  Component Value Date   WBC 5.8 05/25/2021   HGB 10.8 (L) 05/25/2021   HCT 34.3 (L) 05/25/2021   MCV 94.0 05/25/2021   PLT 179 05/25/2021      Chemistry      Component Value Date/Time   NA 138 05/25/2021 1506   K 3.9 05/25/2021 1506   CL 106 05/25/2021 1506   CO2 25 05/25/2021 1506   BUN 24 (H)  05/25/2021 1506   CREATININE 1.23 (H) 05/25/2021 1506   CREATININE 1.32 (H) 02/21/2021 1129   CREATININE 0.90 02/08/2016 0949      Component Value Date/Time   CALCIUM 8.8 (L) 05/25/2021 1506   ALKPHOS 61 05/25/2021 1506   AST 15 05/25/2021 1506   AST 13 (L) 02/21/2021 1129   ALT 10 05/25/2021 1506   ALT 9 02/21/2021 1129   BILITOT 0.6 05/25/2021 1506   BILITOT 0.3 02/21/2021 1129       RADIOGRAPHIC STUDIES: CT Chest W Contrast  Result Date: 06/01/2021 CLINICAL DATA:  Small-cell lung cancer.  Restaging. EXAM: CT CHEST, ABDOMEN, AND PELVIS WITH CONTRAST TECHNIQUE: Multidetector CT imaging of the chest, abdomen and pelvis was performed following the standard protocol during bolus administration of intravenous contrast. CONTRAST:  69mL OMNIPAQUE IOHEXOL 350 MG/ML SOLN COMPARISON:  02/21/2021 FINDINGS: CT CHEST FINDINGS Cardiovascular: The heart size is normal. No substantial pericardial effusion. Coronary artery calcification is evident. Mild  atherosclerotic calcification is noted in the wall of the thoracic aorta. Right Port-A-Cath tip is positioned in the distal SVC near the RA junction. Mediastinum/Nodes: No mediastinal lymphadenopathy. There is no hilar lymphadenopathy. The esophagus has normal imaging features. There is no axillary lymphadenopathy. Lungs/Pleura: Centrilobular and paraseptal emphysema evident. Further evolution of post treatment changes in the left upper lobe with volume loss again noted left hemithorax. The masslike consolidative opacity measured previously at 4.2 x 3.4 cm measures on the order of 2.5 x 1.2 cm today. No new suspicious nodule or mass. There is no evidence of pleural effusion. Musculoskeletal: No worrisome lytic or sclerotic osseous abnormality. CT ABDOMEN PELVIS FINDINGS Hepatobiliary: No suspicious focal abnormality within the liver parenchyma. There is no evidence for gallstones, gallbladder wall thickening, or pericholecystic fluid. No intrahepatic or extrahepatic biliary dilation. Pancreas: No focal mass lesion. No dilatation of the main duct. No intraparenchymal cyst. No peripancreatic edema. Spleen: No splenomegaly. No focal mass lesion. Adrenals/Urinary Tract: No adrenal nodule or mass. Cortical scarring noted in both kidneys without hydronephrosis or suspicious renal mass lesion evident. No evidence for hydroureter. The urinary bladder appears normal for the degree of distention. Stomach/Bowel: Stomach is unremarkable. No gastric wall thickening. No evidence of outlet obstruction. Duodenum is normally positioned as is the ligament of Treitz. No small bowel wall thickening. No small bowel dilatation. The terminal ileum is normal. The appendix is normal. No gross colonic mass. No colonic wall thickening. Diverticular changes are noted in the left colon without evidence of diverticulitis. Vascular/Lymphatic: Status post aorto bi femoral bypass graft. There is no gastrohepatic or hepatoduodenal ligament  lymphadenopathy. No retroperitoneal or mesenteric lymphadenopathy. No pelvic sidewall lymphadenopathy. Reproductive: Uterus surgically absent.  There is no adnexal mass. Other: No intraperitoneal free fluid. Musculoskeletal: No worrisome lytic or sclerotic osseous abnormality. Degenerative disc disease noted lumbar spine. IMPRESSION: 1. Continued evolution of post treatment changes in the left upper lobe. No new or progressive findings to suggest recurrent or metastatic disease in the chest, abdomen, or pelvis. 2. Aortic Atherosclerosis (ICD10-I70.0) and Emphysema (ICD10-J43.9). Electronically Signed   By: Misty Stanley M.D.   On: 06/01/2021 09:12   CT Abdomen Pelvis W Contrast  Result Date: 06/01/2021 CLINICAL DATA:  Small-cell lung cancer.  Restaging. EXAM: CT CHEST, ABDOMEN, AND PELVIS WITH CONTRAST TECHNIQUE: Multidetector CT imaging of the chest, abdomen and pelvis was performed following the standard protocol during bolus administration of intravenous contrast. CONTRAST:  48mL OMNIPAQUE IOHEXOL 350 MG/ML SOLN COMPARISON:  02/21/2021 FINDINGS: CT CHEST FINDINGS Cardiovascular: The heart  size is normal. No substantial pericardial effusion. Coronary artery calcification is evident. Mild atherosclerotic calcification is noted in the wall of the thoracic aorta. Right Port-A-Cath tip is positioned in the distal SVC near the RA junction. Mediastinum/Nodes: No mediastinal lymphadenopathy. There is no hilar lymphadenopathy. The esophagus has normal imaging features. There is no axillary lymphadenopathy. Lungs/Pleura: Centrilobular and paraseptal emphysema evident. Further evolution of post treatment changes in the left upper lobe with volume loss again noted left hemithorax. The masslike consolidative opacity measured previously at 4.2 x 3.4 cm measures on the order of 2.5 x 1.2 cm today. No new suspicious nodule or mass. There is no evidence of pleural effusion. Musculoskeletal: No worrisome lytic or sclerotic  osseous abnormality. CT ABDOMEN PELVIS FINDINGS Hepatobiliary: No suspicious focal abnormality within the liver parenchyma. There is no evidence for gallstones, gallbladder wall thickening, or pericholecystic fluid. No intrahepatic or extrahepatic biliary dilation. Pancreas: No focal mass lesion. No dilatation of the main duct. No intraparenchymal cyst. No peripancreatic edema. Spleen: No splenomegaly. No focal mass lesion. Adrenals/Urinary Tract: No adrenal nodule or mass. Cortical scarring noted in both kidneys without hydronephrosis or suspicious renal mass lesion evident. No evidence for hydroureter. The urinary bladder appears normal for the degree of distention. Stomach/Bowel: Stomach is unremarkable. No gastric wall thickening. No evidence of outlet obstruction. Duodenum is normally positioned as is the ligament of Treitz. No small bowel wall thickening. No small bowel dilatation. The terminal ileum is normal. The appendix is normal. No gross colonic mass. No colonic wall thickening. Diverticular changes are noted in the left colon without evidence of diverticulitis. Vascular/Lymphatic: Status post aorto bi femoral bypass graft. There is no gastrohepatic or hepatoduodenal ligament lymphadenopathy. No retroperitoneal or mesenteric lymphadenopathy. No pelvic sidewall lymphadenopathy. Reproductive: Uterus surgically absent.  There is no adnexal mass. Other: No intraperitoneal free fluid. Musculoskeletal: No worrisome lytic or sclerotic osseous abnormality. Degenerative disc disease noted lumbar spine. IMPRESSION: 1. Continued evolution of post treatment changes in the left upper lobe. No new or progressive findings to suggest recurrent or metastatic disease in the chest, abdomen, or pelvis. 2. Aortic Atherosclerosis (ICD10-I70.0) and Emphysema (ICD10-J43.9). Electronically Signed   By: Misty Stanley M.D.   On: 06/01/2021 09:12     ASSESSMENT AND PLAN: This is a very pleasant 75 years old white female with a  stage IV (T3, N2, M1a) non-small cell lung cancer, poorly differentiated adenocarcinoma diagnosed in June 2021 and presented with large left upper lobe lung mass with mediastinal invasion in addition to mediastinal lymphadenopathy as well as bilateral pulmonary nodules. The patient has no actionable mutations but PD-L1 expression is 100%. Because of the bulky disease I recommended for the patient to consider the combination chemotherapy.  She is interested in this option. She is currently being treated with carboplatin for AUC of 5, Alimta 500 mg/M2 and Keytruda 200 mg IV every 3 weeks status post 11 cycles.  Starting from cycle #5 she is on maintenance treatment with Alimta and Keytruda every 3 weeks.  Starting from cycle #9 she is on treatment with single agent Keytruda.  The patient had a break off treatment for few months after cycle #9 but she had some evidence for disease progression and she resumed her treatment. The patient tolerated the last cycle of her treatment well except for the recurrence of skin rash and itching again.  She decided not to proceed with any further treatment with immunotherapy. She has been in observation for the last 5 months. The patient is  feeling fine today with no concerning complaints except for the baseline shortness of breath and she is currently on home oxygen. She had repeat CT scan of the chest, abdomen pelvis performed recently.  I personally and independently reviewed the scans and discussed the results with the patient and her husband. Her scan showed no concerning findings for disease progression. I recommended for her to continue on observation for now with repeat CT scan of the chest, abdomen pelvis in 3 months. For the skin rash that is significantly improved and we will continue to monitor. She was advised to call immediately if she has any other concerning symptoms in the interval.  The patient voices understanding of current disease status and  treatment options and is in agreement with the current care plan. All questions were answered. The patient knows to call the clinic with any problems, questions or concerns. We can certainly see the patient much sooner if necessary.  Disclaimer: This note was dictated with voice recognition software. Similar sounding words can inadvertently be transcribed and may not be corrected upon review.

## 2021-06-15 ENCOUNTER — Telehealth: Payer: Self-pay | Admitting: Internal Medicine

## 2021-06-15 DIAGNOSIS — J449 Chronic obstructive pulmonary disease, unspecified: Secondary | ICD-10-CM | POA: Diagnosis not present

## 2021-06-15 DIAGNOSIS — K219 Gastro-esophageal reflux disease without esophagitis: Secondary | ICD-10-CM | POA: Diagnosis not present

## 2021-06-15 NOTE — Telephone Encounter (Signed)
Sch per 11/17 los los, pt aware

## 2021-06-28 ENCOUNTER — Other Ambulatory Visit: Payer: Self-pay

## 2021-06-28 ENCOUNTER — Ambulatory Visit (HOSPITAL_COMMUNITY)
Admission: RE | Admit: 2021-06-28 | Discharge: 2021-06-28 | Disposition: A | Discharge status: Home / Self Care | Payer: Medicare Other | Source: Ambulatory Visit | Attending: Family Medicine | Admitting: Family Medicine

## 2021-06-28 ENCOUNTER — Other Ambulatory Visit (HOSPITAL_COMMUNITY): Payer: Self-pay | Admitting: Family Medicine

## 2021-06-28 DIAGNOSIS — M25512 Pain in left shoulder: Secondary | ICD-10-CM | POA: Diagnosis not present

## 2021-06-28 DIAGNOSIS — M25519 Pain in unspecified shoulder: Secondary | ICD-10-CM | POA: Diagnosis not present

## 2021-06-28 DIAGNOSIS — M19012 Primary osteoarthritis, left shoulder: Secondary | ICD-10-CM | POA: Diagnosis not present

## 2021-06-28 DIAGNOSIS — J439 Emphysema, unspecified: Secondary | ICD-10-CM | POA: Diagnosis not present

## 2021-07-04 DIAGNOSIS — M25512 Pain in left shoulder: Secondary | ICD-10-CM | POA: Diagnosis not present

## 2021-07-15 DIAGNOSIS — K219 Gastro-esophageal reflux disease without esophagitis: Secondary | ICD-10-CM | POA: Diagnosis not present

## 2021-07-16 DIAGNOSIS — J449 Chronic obstructive pulmonary disease, unspecified: Secondary | ICD-10-CM | POA: Diagnosis not present

## 2021-07-23 DIAGNOSIS — R059 Cough, unspecified: Secondary | ICD-10-CM | POA: Diagnosis not present

## 2021-07-23 DIAGNOSIS — R0981 Nasal congestion: Secondary | ICD-10-CM | POA: Diagnosis not present

## 2021-08-01 ENCOUNTER — Encounter: Payer: Self-pay | Admitting: Pulmonary Disease

## 2021-08-01 ENCOUNTER — Other Ambulatory Visit: Payer: Self-pay

## 2021-08-01 ENCOUNTER — Ambulatory Visit: Payer: Medicare Other | Admitting: Pulmonary Disease

## 2021-08-01 DIAGNOSIS — J9611 Chronic respiratory failure with hypoxia: Secondary | ICD-10-CM | POA: Diagnosis not present

## 2021-08-01 DIAGNOSIS — C3492 Malignant neoplasm of unspecified part of left bronchus or lung: Secondary | ICD-10-CM | POA: Diagnosis not present

## 2021-08-01 DIAGNOSIS — J432 Centrilobular emphysema: Secondary | ICD-10-CM

## 2021-08-01 NOTE — Assessment & Plan Note (Signed)
Continue Trelegy, refills will be provided

## 2021-08-01 NOTE — Progress Notes (Signed)
° °  Subjective:    Patient ID: Kayla Sawyer, female    DOB: April 06, 1946, 76 y.o.   MRN: [id number]  HPI  76 yo smoker for follow-up of COPD, lung cancer and chronic hypoxic respiratory failure Presented 11/2019 left upper lobe lung mass, hypermetabolic with scattered bilateral nodules and hilar and mediastinal lymphadenopathy >>   Stage IV (T3, N2, M1a) non-small cell lung cancer, poorly differentiated adenocarcinoma , PDL1 Expression: 100%   S/p palliative radiotherapy Systemic chemotherapy with carboplatin , Alimta and Keytruda  every 3 weeks first dose 01/2020  disease progression after stopping Keytruda  (skin rash ) -off Rx since 12/2020, under observation  Kayla Sawyer is accompanied by her husband, 19-month follow-up visit, she is in wheelchair on 4 L pulse oxygen. Wonders if she can get portable continuous oxygen They have moved into a new home, daughter and her family live in the lower level.  She continues to smoke 1 pack/day.  I reviewed oncology consultation Reviewed CT and images with the patient CT chest/abd/pelvis 05/2021 LUL mass like consolidation decreased to  2.5 x 1.2 cm   Significant tests/ events reviewed PFTs 11/2015 showed ratio 52 with FEV1 53%, FVC 77%, paradoxical drop with bronhodilator, TLC 99% & DLCO 29%   HRCT 07/2016 RUL nodule regressed, no ILD, moderate centrilobular and paraseptal emphysema   11/2019 she did not desaturate on walking on 3 L pulse oxygen but her heart rate went up from 86-1 37  Review of Systems neg for any significant sore throat, dysphagia, itching, sneezing, nasal congestion or excess/ purulent secretions, fever, chills, sweats, unintended wt loss, pleuritic or exertional cp, hempoptysis, orthopnea pnd or change in chronic leg swelling. Also denies presyncope, palpitations, heartburn, abdominal pain, nausea, vomiting, diarrhea or change in bowel or urinary habits, dysuria,hematuria, rash, arthralgias, visual complaints, headache, numbness weakness  or ataxia.     Objective:   Physical Exam  Gen. Pleasant, thin and frail, in wheelchair, on oxygen nasal cannula, in no distress ENT - no thrush, no pallor/icterus,no post nasal drip Neck: No JVD, no thyromegaly, no carotid bruits Lungs: no use of accessory muscles, no dullness to percussion, decreased bilateral without rales or rhonchi  Cardiovascular: Rhythm regular, heart sounds  normal, no murmurs or gallops, no peripheral edema Musculoskeletal: No deformities, no cyanosis or clubbing        Assessment & Plan:

## 2021-08-01 NOTE — Assessment & Plan Note (Signed)
Surveillance imaging, 4-month follow-up scheduled in February.  She is off chemotherapy Under observation by oncology

## 2021-08-01 NOTE — Assessment & Plan Note (Signed)
Unfortunately she needs continuous oxygen and POC even at 4 L is suboptimal on exertion although this seems to work at rest when she is in the wheelchair.  This does increase her ability to go outside so we will have her continue

## 2021-08-01 NOTE — Patient Instructions (Signed)
Continue trelegy  Continue on oxygen

## 2021-08-11 DIAGNOSIS — R0981 Nasal congestion: Secondary | ICD-10-CM | POA: Diagnosis not present

## 2021-08-16 DIAGNOSIS — J449 Chronic obstructive pulmonary disease, unspecified: Secondary | ICD-10-CM | POA: Diagnosis not present

## 2021-08-21 ENCOUNTER — Emergency Department (HOSPITAL_COMMUNITY): Payer: Medicare Other

## 2021-08-21 ENCOUNTER — Emergency Department (HOSPITAL_COMMUNITY)
Admission: EM | Admit: 2021-08-21 | Discharge: 2021-08-21 | Disposition: A | Discharge status: Home / Self Care | Payer: Medicare Other | Attending: Emergency Medicine | Admitting: Emergency Medicine

## 2021-08-21 ENCOUNTER — Other Ambulatory Visit: Payer: Self-pay

## 2021-08-21 ENCOUNTER — Encounter (HOSPITAL_COMMUNITY): Payer: Self-pay | Admitting: *Deleted

## 2021-08-21 DIAGNOSIS — Z7982 Long term (current) use of aspirin: Secondary | ICD-10-CM | POA: Diagnosis not present

## 2021-08-21 DIAGNOSIS — Z743 Need for continuous supervision: Secondary | ICD-10-CM | POA: Diagnosis not present

## 2021-08-21 DIAGNOSIS — R079 Chest pain, unspecified: Secondary | ICD-10-CM | POA: Diagnosis not present

## 2021-08-21 DIAGNOSIS — Z85118 Personal history of other malignant neoplasm of bronchus and lung: Secondary | ICD-10-CM | POA: Diagnosis not present

## 2021-08-21 DIAGNOSIS — J439 Emphysema, unspecified: Secondary | ICD-10-CM | POA: Diagnosis not present

## 2021-08-21 DIAGNOSIS — Z955 Presence of coronary angioplasty implant and graft: Secondary | ICD-10-CM | POA: Insufficient documentation

## 2021-08-21 DIAGNOSIS — R110 Nausea: Secondary | ICD-10-CM | POA: Insufficient documentation

## 2021-08-21 DIAGNOSIS — R0602 Shortness of breath: Secondary | ICD-10-CM | POA: Insufficient documentation

## 2021-08-21 DIAGNOSIS — R0789 Other chest pain: Secondary | ICD-10-CM | POA: Insufficient documentation

## 2021-08-21 DIAGNOSIS — Z79899 Other long term (current) drug therapy: Secondary | ICD-10-CM | POA: Insufficient documentation

## 2021-08-21 DIAGNOSIS — R0689 Other abnormalities of breathing: Secondary | ICD-10-CM | POA: Diagnosis not present

## 2021-08-21 DIAGNOSIS — J449 Chronic obstructive pulmonary disease, unspecified: Secondary | ICD-10-CM | POA: Diagnosis not present

## 2021-08-21 LAB — DIFFERENTIAL
Abs Immature Granulocytes: 0.04 10*3/uL (ref 0.00–0.07)
Basophils Absolute: 0 10*3/uL (ref 0.0–0.1)
Basophils Relative: 0 %
Eosinophils Absolute: 0 10*3/uL (ref 0.0–0.5)
Eosinophils Relative: 1 %
Immature Granulocytes: 1 %
Lymphocytes Relative: 14 %
Lymphs Abs: 0.9 10*3/uL (ref 0.7–4.0)
Monocytes Absolute: 0.7 10*3/uL (ref 0.1–1.0)
Monocytes Relative: 10 %
Neutro Abs: 4.9 10*3/uL (ref 1.7–7.7)
Neutrophils Relative %: 74 %

## 2021-08-21 LAB — CBC
HCT: 34.4 % — ABNORMAL LOW (ref 36.0–46.0)
Hemoglobin: 11 g/dL — ABNORMAL LOW (ref 12.0–15.0)
MCH: 30.2 pg (ref 26.0–34.0)
MCHC: 32 g/dL (ref 30.0–36.0)
MCV: 94.5 fL (ref 80.0–100.0)
Platelets: 148 10*3/uL — ABNORMAL LOW (ref 150–400)
RBC: 3.64 MIL/uL — ABNORMAL LOW (ref 3.87–5.11)
RDW: 14.9 % (ref 11.5–15.5)
WBC: 6.6 10*3/uL (ref 4.0–10.5)
nRBC: 0 % (ref 0.0–0.2)

## 2021-08-21 LAB — BASIC METABOLIC PANEL
Anion gap: 10 (ref 5–15)
BUN: 40 mg/dL — ABNORMAL HIGH (ref 8–23)
CO2: 24 mmol/L (ref 22–32)
Calcium: 8.4 mg/dL — ABNORMAL LOW (ref 8.9–10.3)
Chloride: 104 mmol/L (ref 98–111)
Creatinine, Ser: 1.32 mg/dL — ABNORMAL HIGH (ref 0.44–1.00)
GFR, Estimated: 42 mL/min — ABNORMAL LOW (ref >60)
Glucose, Bld: 89 mg/dL (ref 70–99)
Potassium: 3.7 mmol/L (ref 3.5–5.1)
Sodium: 138 mmol/L (ref 135–145)

## 2021-08-21 LAB — TROPONIN I (HIGH SENSITIVITY)
Troponin I (High Sensitivity): 7 ng/L (ref <18)
Troponin I (High Sensitivity): 6 ng/L (ref <18)

## 2021-08-21 MED ORDER — NITROGLYCERIN 0.4 MG SL SUBL
0.4000 mg | SUBLINGUAL_TABLET | SUBLINGUAL | Status: DC | PRN
  Administered 2021-08-21 (×3): 0.4 mg via SUBLINGUAL
  Filled 2021-08-21: qty 1

## 2021-08-21 MED ORDER — NITROGLYCERIN 0.4 MG SL SUBL: 0.4000 mg | SUBLINGUAL_TABLET | SUBLINGUAL | 0 refills | Status: DC | PRN

## 2021-08-21 NOTE — ED Provider Notes (Signed)
[facility] EMERGENCY DEPARTMENT Provider Note   CSN: [id number] Arrival date & time: 08/21/21  1451     History  Chief Complaint  Patient presents with   Chest Pain    Kayla Sawyer is a 76 y.o. female.   Chest Pain  This patient is a 76 year old female, she has a history of lung cancer stage IV, finished treatment about a year ago, she was supposed to be taking Keytruda but not tolerate this medication so has essentially been unmedicated for the last year.  She is on 4 L by nasal cannula chronically, today while she was sitting at the table playing cards with family about 2 hours ago she developed increasing amounts of pain in her chest which feels like a pressure with radiation into her throat and neck.  This does not feel like acid reflux, it was associated with nausea and some shortness of breath, there is no swelling of the legs.  No history of blood clots.  I reviewed the patient's medication list, she does take a baby aspirin, she does not take any other anticoagulants.  She talked to her daughter who is an ER nurse who told her to take 4 baby aspirin's and come to the ER.  The patient states that her symptoms are mildly better but still present.    The patient denies any prior cardiac history, however she did have an aorto bifemoral bypass graft in 2017, also had an aorta to superior mesenteric artery bypass graft  Her last echocardiogram was in 2017 and showed an ejection fraction of 65 to 70% with grade 1 diastolic dysfunction.    Home Medications Prior to Admission medications   Medication Sig Start Date End Date Taking? Authorizing Provider  nitroGLYCERIN (NITROSTAT) 0.4 MG SL tablet Place 1 tablet (0.4 mg total) under the tongue every 5 (five) minutes as needed for chest pain. 08/21/21  Yes Noemi Chapel, MD  acetaminophen (TYLENOL) 500 MG tablet Take 500 mg by mouth every 6 (six) hours as needed for mild pain.    [provider]  albuterol (PROVENTIL) (2.5 MG/3ML)  0.083% nebulizer solution Take 3 mLs (2.5 mg total) by nebulization every 6 (six) hours as needed for wheezing or shortness of breath. 12/09/19   Rigoberto Noel, MD  aspirin EC 81 MG tablet Take 1 tablet (81 mg total) by mouth daily. 02/08/16   Wellington Hampshire, MD  atorvastatin (LIPITOR) 40 MG tablet Take 40 mg by mouth daily.    [provider]  betamethasone dipropionate 0.05 % cream Apply topically. 01/05/21   [provider]  fluticasone (CUTIVATE) 0.05 % cream Apply topically. 01/05/21   [provider]  folic acid (FOLVITE) 1 MG tablet Take 1 tablet (1 mg total) by mouth daily. 05/13/20   Heilingoetter, Cassandra L, PA-C  ketoconazole (NIZORAL) 2 % cream Apply topically. 01/05/21   [provider]  levocetirizine (XYZAL) 5 MG tablet Take 5 mg by mouth every evening.  08/31/16   [provider]  lidocaine-prilocaine (EMLA) cream Apply to the Port-A-Cath site 30-60-minute before chemotherapy every 3 weeks. 02/06/20   Curt Bears, MD  Multiple Vitamin (MULTIVITAMIN WITH MINERALS) TABS tablet Take 1 tablet by mouth daily.    [provider]  mupirocin ointment (BACTROBAN) 2 % SMARTSIG:1 Application Topical 2-3 Times Daily 05/10/20   [provider]  MYRBETRIQ 25 MG TB24 tablet Take 25 mg by mouth daily. 02/21/21   [provider]  omeprazole (PRILOSEC) 20 MG capsule Take  20 mg by mouth daily.  05/13/15   [provider]  prochlorperazine (COMPAZINE) 10 MG tablet Take 1 tablet (10 mg total) by mouth every 6 (six) hours as needed for nausea or vomiting. 02/05/20   Curt Bears, MD  sertraline (ZOLOFT) 100 MG tablet Take 100 mg by mouth daily.  04/19/16   [provider]  TRELEGY ELLIPTA 100-62.5-25 MCG/INH AEPB Inhale 1 puff into the lungs daily. 09/21/19   [provider]      Allergies    Elemental sulfur    Review of Systems   Review of Systems  Cardiovascular:  Positive for chest pain.  All  other systems reviewed and are negative.  Physical Exam Updated Vital Signs BP (!) 116/53    Pulse 73    Temp 98.4 F (36.9 C) (Oral)    Resp (!) 21    Ht 1.702 m (5\' 7" )    Wt 62.1 kg    SpO2 100%    BMI 21.46 kg/m  Physical Exam Vitals and nursing note reviewed.  Constitutional:      General: She is not in acute distress.    Appearance: She is well-developed.  HENT:     Head: Normocephalic and atraumatic.     Mouth/Throat:     Pharynx: No oropharyngeal exudate.  Eyes:     General: No scleral icterus.       Right eye: No discharge.        Left eye: No discharge.     Conjunctiva/sclera: Conjunctivae normal.     Pupils: Pupils are equal, round, and reactive to light.  Neck:     Thyroid: No thyromegaly.     Vascular: No JVD.  Cardiovascular:     Rate and Rhythm: Normal rate and regular rhythm.     Heart sounds: Normal heart sounds. No murmur heard.   No friction rub. No gallop.  Pulmonary:     Effort: Pulmonary effort is normal. No respiratory distress.     Breath sounds: Normal breath sounds. No wheezing or rales.  Abdominal:     General: Bowel sounds are normal. There is no distension.     Palpations: Abdomen is soft. There is no mass.     Tenderness: There is no abdominal tenderness.  Musculoskeletal:        General: No tenderness. Normal range of motion.     Cervical back: Normal range of motion and neck supple.     Right lower leg: No edema.     Left lower leg: No edema.  Lymphadenopathy:     Cervical: No cervical adenopathy.  Skin:    General: Skin is warm and dry.     Findings: No erythema or rash.  Neurological:     Mental Status: She is alert.     Coordination: Coordination normal.  Psychiatric:        Behavior: Behavior normal.    ED Results / Procedures / Treatments   Labs (all labs ordered are listed, but only abnormal results are displayed) Labs Reviewed  BASIC METABOLIC PANEL - Abnormal; Notable for the following components:      Result Value    BUN 40 (*)    Creatinine, Ser 1.32 (*)    Calcium 8.4 (*)    GFR, Estimated 42 (*)    All other components within normal limits  CBC - Abnormal; Notable for the following components:   RBC 3.64 (*)    Hemoglobin 11.0 (*)    HCT 34.4 (*)  Platelets 148 (*)    All other components within normal limits  DIFFERENTIAL  TROPONIN I (HIGH SENSITIVITY)  TROPONIN I (HIGH SENSITIVITY)    EKG EKG Interpretation  Date/Time:  Sunday August 21 2021 15:02:17 EST Ventricular Rate:  75 PR Interval:  194 QRS Duration: 83 QT Interval:  428 QTC Calculation: 479 R Axis:   78 Text Interpretation: Sinus rhythm Probable left atrial enlargement Anteroseptal infarct, age indeterminate Confirmed by Noemi Chapel [phone]) on 08/21/2021 3:14:01 PM  Radiology DG Chest Port 1 View  Result Date: 08/21/2021 CLINICAL DATA:  Chest pain EXAM: PORTABLE CHEST 1 VIEW COMPARISON:  Previous chest radiographs including the examination of 01/12/2020 and CT chest done on 05/31/2021 FINDINGS: Cardiac size is within normal limits. Low position of diaphragms suggests COPD. Emphysematous changes are noted in the upper lung fields, more so on the right side. There is decreased volume in the left lung. Increased interstitial markings in the left upper lung fields may suggest scarring. Small transverse linear densities in the right lower lung fields suggest scarring. There is no significant pleural effusion or pneumothorax. Tip of right IJ chest port is seen in the superior vena cava. IMPRESSION: Severe COPD. There is decreased volume in the left lung which may be related to previous intervention. There is no new focal pulmonary consolidation. There are no signs of pulmonary edema. Interstitial markings in the left upper lung fields may suggest scarring. Small transverse linear densities in the right lower lung fields suggest scarring or subsegmental atelectasis. Electronically Signed   By: Elmer Picker M.D.   On: 08/21/2021  15:17    Procedures Procedures    Medications Ordered in ED Medications  nitroGLYCERIN (NITROSTAT) SL tablet 0.4 mg (0.4 mg Sublingual Given 08/21/21 1541)    ED Course/ Medical Decision Making/ A&P Clinical Course as of 08/21/21 1818  Sun Aug 21, 2021  1711 Rechecked at 5:00 PM - no sx at this time - sx free after nitroglycerin. [BM]    Clinical Course User Index [BM] Noemi Chapel, MD                           Medical Decision Making Risk Prescription drug management.   This patient presents to the ED for concern of chest pressure, this involves an extensive number of treatment options, and is a complaint that carries with it a high risk of complications and morbidity.  The differential diagnosis includes acute coronary disease, pneumothorax, pneumonia, pulmonary embolism, less likely to be aortic dissection   Co morbidities that complicate the patient evaluation  Stage IV lung cancer, chronic hypoxia, persistent tobacco use, hypercholesterolemia, reactive airway disease   Additional history obtained:  Additional history obtained from electronic medical record External records from outside source obtained and reviewed including prior operative notes, prior follow-up notes, oncology notes   Lab Tests:  I Ordered, and personally interpreted labs.  The pertinent results include: CBC, metabolic panel and troponin, all of which were unremarkable, in fact a second troponin was lower than the first troponin.   Imaging Studies ordered:  I ordered imaging studies including portable chest x-ray I independently visualized and interpreted imaging which showed left-sided pulmonary mass and atelectasis, no pneumothorax I agree with the radiologist interpretation   Cardiac Monitoring:  The patient was maintained on a cardiac monitor.  I personally viewed and interpreted the cardiac monitored which showed an underlying rhythm of: Normal sinus rhythm, no ischemia or  arrhythmia   Medicines  ordered and prescription drug management:  I ordered medication including aspirin and nitroglycerin for chest pain Reevaluation of the patient after these medicines showed that the patient resolved I have reviewed the patients home medicines and have made adjustments as needed   Test Considered:  CT scan of the chest but this does not seem necessary given that the patient has no hypoxia and at this time is no tachycardia or swelling of the legs, symptoms completely resolved with the nitroglycerin   Critical Interventions:  Evaluation for pathologic causes of chest pain    Problem List / ED Course:  Chest pain, evaluated at length for cause of patient's symptoms, she has had 2 negative troponins, she is completely chest pain-free and does not want to be admitted to the hospital but is agreeable to follow-up in the outpatient setting with cardiology.  I think this is reasonable, the patient is agreeable, she states this is not like acid reflux, she has no signs of pulmonary embolism at this time and is completely chest pain-free.   Reevaluation:  After the interventions noted above, I reevaluated the patient and found that they have :improved   Social Determinants of Health:  None   Dispostion:  After consideration of the diagnostic results and the patients response to treatment, I feel that the patent would benefit from discharge home with close follow-up, the patient and her daughter who is a nurse are at the bedside and are in total agreement with the plan, they understand the occasions for return.          Final Clinical Impression(s) / ED Diagnoses Final diagnoses:  Chest pressure    Rx / DC Orders ED Discharge Orders          Ordered    nitroGLYCERIN (NITROSTAT) 0.4 MG SL tablet  Every 5 min PRN        08/21/21 1817    Ambulatory referral to Cardiology        08/21/21 1818              Noemi Chapel, MD 08/21/21 1818

## 2021-08-21 NOTE — ED Triage Notes (Signed)
Pt arrived via [facility] for CP mid that radiates up to throat, belching frequently.  Nausea earlier. Home O2 at 4 L/M.  Lung CA Cbg 121, HR 90, 124/76/ 100 % on 4 L/M 18 G placed to rt [facility]

## 2021-08-21 NOTE — ED Notes (Signed)
Patient denies pain and is resting comfortably.  

## 2021-08-21 NOTE — Discharge Instructions (Addendum)
Your testing today does not show any signs of heart attack, you are completely symptom-free after the medications that we have given you.  Your blood work was unremarkable and reassuring, your chest x-ray did not show any signs of pneumonia, I would like for you to have a prescription for nitroglycerin, take 1 tablet every 5 minutes as needed for chest pain however if you are not getting better after 3 tablets you should come back to the emergency department immediately.  I would also like for you to follow-up with a cardiologist, please see the attached phone number, call in the morning to make an appointment for this week.  I will make a referral for you to get into the cardiologist and they should call you with an appointment however please call in the morning to expedite this.  Emergency department for severe or worsening symptoms

## 2021-08-29 ENCOUNTER — Other Ambulatory Visit: Payer: Self-pay

## 2021-08-29 MED ORDER — ALBUTEROL SULFATE (2.5 MG/3ML) 0.083% IN NEBU: 2.5000 mg | INHALATION_SOLUTION | Freq: Four times a day (QID) | RESPIRATORY_TRACT | 12 refills | Status: DC | PRN

## 2021-08-29 MED ORDER — TRELEGY ELLIPTA 100-62.5-25 MCG/ACT IN AEPB: 1.0000 | INHALATION_SPRAY | Freq: Every day | RESPIRATORY_TRACT | 11 refills | Status: DC

## 2021-09-05 ENCOUNTER — Other Ambulatory Visit: Payer: Self-pay

## 2021-09-05 ENCOUNTER — Ambulatory Visit (HOSPITAL_COMMUNITY)
Admission: RE | Admit: 2021-09-05 | Discharge: 2021-09-05 | Disposition: A | Discharge status: Home / Self Care | Payer: Medicare Other | Source: Ambulatory Visit | Attending: Internal Medicine | Admitting: Internal Medicine

## 2021-09-05 ENCOUNTER — Inpatient Hospital Stay: Payer: Medicare Other | Attending: Internal Medicine

## 2021-09-05 DIAGNOSIS — J439 Emphysema, unspecified: Secondary | ICD-10-CM | POA: Diagnosis not present

## 2021-09-05 DIAGNOSIS — I2510 Atherosclerotic heart disease of native coronary artery without angina pectoris: Secondary | ICD-10-CM | POA: Insufficient documentation

## 2021-09-05 DIAGNOSIS — R0789 Other chest pain: Secondary | ICD-10-CM | POA: Insufficient documentation

## 2021-09-05 DIAGNOSIS — C3490 Malignant neoplasm of unspecified part of unspecified bronchus or lung: Secondary | ICD-10-CM | POA: Diagnosis not present

## 2021-09-05 DIAGNOSIS — K760 Fatty (change of) liver, not elsewhere classified: Secondary | ICD-10-CM | POA: Diagnosis not present

## 2021-09-05 DIAGNOSIS — Z79899 Other long term (current) drug therapy: Secondary | ICD-10-CM | POA: Insufficient documentation

## 2021-09-05 DIAGNOSIS — I3139 Other pericardial effusion (noninflammatory): Secondary | ICD-10-CM | POA: Insufficient documentation

## 2021-09-05 DIAGNOSIS — K5900 Constipation, unspecified: Secondary | ICD-10-CM | POA: Diagnosis not present

## 2021-09-05 DIAGNOSIS — J90 Pleural effusion, not elsewhere classified: Secondary | ICD-10-CM | POA: Diagnosis not present

## 2021-09-05 DIAGNOSIS — R21 Rash and other nonspecific skin eruption: Secondary | ICD-10-CM | POA: Insufficient documentation

## 2021-09-05 DIAGNOSIS — C3412 Malignant neoplasm of upper lobe, left bronchus or lung: Secondary | ICD-10-CM | POA: Insufficient documentation

## 2021-09-05 DIAGNOSIS — I700 Atherosclerosis of aorta: Secondary | ICD-10-CM | POA: Insufficient documentation

## 2021-09-05 DIAGNOSIS — Z9981 Dependence on supplemental oxygen: Secondary | ICD-10-CM | POA: Insufficient documentation

## 2021-09-05 DIAGNOSIS — R0602 Shortness of breath: Secondary | ICD-10-CM | POA: Insufficient documentation

## 2021-09-05 DIAGNOSIS — K3189 Other diseases of stomach and duodenum: Secondary | ICD-10-CM | POA: Diagnosis not present

## 2021-09-05 LAB — CMP (CANCER CENTER ONLY)
ALT: 9 U/L (ref 0–44)
AST: 16 U/L (ref 15–41)
Albumin: 3.9 g/dL (ref 3.5–5.0)
Alkaline Phosphatase: 57 U/L (ref 38–126)
Anion gap: 7 (ref 5–15)
BUN: 27 mg/dL — ABNORMAL HIGH (ref 8–23)
CO2: 29 mmol/L (ref 22–32)
Calcium: 8.8 mg/dL — ABNORMAL LOW (ref 8.9–10.3)
Chloride: 105 mmol/L (ref 98–111)
Creatinine: 1.34 mg/dL — ABNORMAL HIGH (ref 0.44–1.00)
GFR, Estimated: 42 mL/min — ABNORMAL LOW (ref >60)
Glucose, Bld: 87 mg/dL (ref 70–99)
Potassium: 3.7 mmol/L (ref 3.5–5.1)
Sodium: 141 mmol/L (ref 135–145)
Total Bilirubin: 0.4 mg/dL (ref 0.3–1.2)
Total Protein: 6.5 g/dL (ref 6.5–8.1)

## 2021-09-05 LAB — CBC WITH DIFFERENTIAL (CANCER CENTER ONLY)
Abs Immature Granulocytes: 0.02 10*3/uL (ref 0.00–0.07)
Basophils Absolute: 0 10*3/uL (ref 0.0–0.1)
Basophils Relative: 1 %
Eosinophils Absolute: 0.1 10*3/uL (ref 0.0–0.5)
Eosinophils Relative: 1 %
HCT: 34.7 % — ABNORMAL LOW (ref 36.0–46.0)
Hemoglobin: 10.9 g/dL — ABNORMAL LOW (ref 12.0–15.0)
Immature Granulocytes: 0 %
Lymphocytes Relative: 22 %
Lymphs Abs: 1.1 10*3/uL (ref 0.7–4.0)
MCH: 29.4 pg (ref 26.0–34.0)
MCHC: 31.4 g/dL (ref 30.0–36.0)
MCV: 93.5 fL (ref 80.0–100.0)
Monocytes Absolute: 0.5 10*3/uL (ref 0.1–1.0)
Monocytes Relative: 10 %
Neutro Abs: 3.2 10*3/uL (ref 1.7–7.7)
Neutrophils Relative %: 66 %
Platelet Count: 206 10*3/uL (ref 150–400)
RBC: 3.71 MIL/uL — ABNORMAL LOW (ref 3.87–5.11)
RDW: 14.7 % (ref 11.5–15.5)
WBC Count: 4.9 10*3/uL (ref 4.0–10.5)
nRBC: 0 % (ref 0.0–0.2)

## 2021-09-05 MED ORDER — IOHEXOL 300 MG/ML  SOLN
80.0000 mL | Freq: Once | INTRAMUSCULAR | Status: AC | PRN
  Administered 2021-09-05: 80 mL via INTRAVENOUS

## 2021-09-05 MED ORDER — IOHEXOL 300 MG/ML  SOLN: 75.0000 mL | Freq: Once | INTRAMUSCULAR | Status: DC | PRN

## 2021-09-08 ENCOUNTER — Inpatient Hospital Stay: Payer: Medicare Other | Admitting: Internal Medicine

## 2021-09-08 ENCOUNTER — Encounter: Payer: Self-pay | Admitting: Internal Medicine

## 2021-09-08 ENCOUNTER — Other Ambulatory Visit: Payer: Self-pay

## 2021-09-08 VITALS — BP 90/61 | HR 91 | Temp 96.3°F | Resp 19 | Ht 67.0 in | Wt 136.3 lb

## 2021-09-08 DIAGNOSIS — K760 Fatty (change of) liver, not elsewhere classified: Secondary | ICD-10-CM | POA: Diagnosis not present

## 2021-09-08 DIAGNOSIS — R0789 Other chest pain: Secondary | ICD-10-CM | POA: Diagnosis not present

## 2021-09-08 DIAGNOSIS — C3490 Malignant neoplasm of unspecified part of unspecified bronchus or lung: Secondary | ICD-10-CM | POA: Diagnosis not present

## 2021-09-08 DIAGNOSIS — I700 Atherosclerosis of aorta: Secondary | ICD-10-CM | POA: Diagnosis not present

## 2021-09-08 DIAGNOSIS — C3492 Malignant neoplasm of unspecified part of left bronchus or lung: Secondary | ICD-10-CM

## 2021-09-08 DIAGNOSIS — Z79899 Other long term (current) drug therapy: Secondary | ICD-10-CM | POA: Diagnosis not present

## 2021-09-08 DIAGNOSIS — R0602 Shortness of breath: Secondary | ICD-10-CM | POA: Diagnosis not present

## 2021-09-08 DIAGNOSIS — I3139 Other pericardial effusion (noninflammatory): Secondary | ICD-10-CM | POA: Diagnosis not present

## 2021-09-08 DIAGNOSIS — I2510 Atherosclerotic heart disease of native coronary artery without angina pectoris: Secondary | ICD-10-CM | POA: Diagnosis not present

## 2021-09-08 DIAGNOSIS — R21 Rash and other nonspecific skin eruption: Secondary | ICD-10-CM | POA: Diagnosis not present

## 2021-09-08 DIAGNOSIS — J439 Emphysema, unspecified: Secondary | ICD-10-CM | POA: Diagnosis not present

## 2021-09-08 DIAGNOSIS — Z9981 Dependence on supplemental oxygen: Secondary | ICD-10-CM | POA: Diagnosis not present

## 2021-09-08 DIAGNOSIS — C3412 Malignant neoplasm of upper lobe, left bronchus or lung: Secondary | ICD-10-CM | POA: Diagnosis not present

## 2021-09-08 NOTE — Progress Notes (Signed)
[place] [phone]   Fax:([phone]  OFFICE PROGRESS NOTE  Celene Squibb, MD [address] [place] [address]  DIAGNOSIS: Stage IV (T3, N2, M1a) non-small cell lung cancer, poorly differentiated adenocarcinoma presented with large left upper lobe lung mass in addition to mediastinal lymphadenopathy and bilateral pulmonary nodules diagnosed in June 2021.  Biomarker Findings Tumor Mutational Burden - 10 Muts/Mb Microsatellite status - MS-Stable Genomic Findings For a complete list of the genes assayed, please refer to the Appendix. NF1 Q2582* KRAS G12V CTNNB1 A654fs*10 TP53 H179N 7 Disease relevant genes with no reportable alterations: ALK, BRAF, EGFR, ERBB2, MET, RET, ROS1  PDL1 Expression: 100%  PRIOR THERAPY:  1) Palliative radiotherapy to the large left upper lobe lung mass. 2) Systemic chemotherapy with carboplatin for AUC of 5, Alimta 500 mg/M2 and Keytruda 200 mg IV every 3 weeks.  First dose 02/12/2020.  Status post 11 cycles.  Starting from cycle #5 she was on maintenance treatment with Alimta and Keytruda every 3 weeks.  Starting cycle #9 she will be on single agent Keytruda every 3 weeks.  Her treatment is currently on hold secondary to significant skin rash.  CURRENT THERAPY: Observation.  INTERVAL HISTORY: Kayla Sawyer 76 y.o. female returns to the clinic today for follow-up visit.  The patient is feeling fine today with no concerning complaints except for the baseline shortness of breath and she is currently on home oxygen.  She had chest tightness few weeks ago and she was seen at the emergency department and was advised to see her cardiologist for evaluation.  She was given nitroglycerin as needed basis.  She denied having any cough or hemoptysis.  She has no nausea, vomiting, diarrhea or constipation.  She has no headache or visual changes.  She is currently on observation.  She had repeat CT scan of the chest, abdomen  pelvis performed recently and she is here for evaluation and discussion of her scan results.  MEDICAL HISTORY: Past Medical History:  Diagnosis Date   Arthritis    COPD (chronic obstructive pulmonary disease) ([place])    Depression    E. coli pyelonephritis 6//2014   GERD (gastroesophageal reflux disease)    Pneumonia    Pulmonary hypertension ([facility])    Sepsis ([facility])     ALLERGIES:  is allergic to elemental sulfur.  MEDICATIONS:  Current Outpatient Medications  Medication Sig Dispense Refill   acetaminophen (TYLENOL) 500 MG tablet Take 500 mg by mouth every 6 (six) hours as needed for mild pain.     albuterol (PROVENTIL) (2.5 MG/3ML) 0.083% nebulizer solution Take 3 mLs (2.5 mg total) by nebulization every 6 (six) hours as needed for wheezing or shortness of breath. 75 mL 12   aspirin EC 81 MG tablet Take 1 tablet (81 mg total) by mouth daily.     atorvastatin (LIPITOR) 40 MG tablet Take 40 mg by mouth daily.     betamethasone dipropionate 0.05 % cream Apply topically.     fluticasone (CUTIVATE) 0.05 % cream Apply topically.     Fluticasone-Umeclidin-Vilant (TRELEGY ELLIPTA) 100-62.5-25 MCG/ACT AEPB Inhale 1 puff into the lungs daily. 60 each 11   folic acid (FOLVITE) 1 MG tablet Take 1 tablet (1 mg total) by mouth daily. 30 tablet 4   ketoconazole (NIZORAL) 2 % cream Apply topically.     levocetirizine (XYZAL) 5 MG tablet Take 5 mg by mouth every evening.      lidocaine-prilocaine (EMLA) cream  Apply to the Port-A-Cath site 30-60-minute before chemotherapy every 3 weeks. 30 g 0   Multiple Vitamin (MULTIVITAMIN WITH MINERALS) TABS tablet Take 1 tablet by mouth daily.     mupirocin ointment (BACTROBAN) 2 % SMARTSIG:1 Application Topical 2-3 Times Daily     MYRBETRIQ 25 MG TB24 tablet Take 25 mg by mouth daily.     nitroGLYCERIN (NITROSTAT) 0.4 MG SL tablet Place 1 tablet (0.4 mg total) under the tongue every 5 (five) minutes as needed for chest pain. 30 tablet 0   omeprazole (PRILOSEC) 20  MG capsule Take 20 mg by mouth daily.      prochlorperazine (COMPAZINE) 10 MG tablet Take 1 tablet (10 mg total) by mouth every 6 (six) hours as needed for nausea or vomiting. 30 tablet 0   sertraline (ZOLOFT) 100 MG tablet Take 100 mg by mouth daily.      No current facility-administered medications for this visit.   Facility-Administered Medications Ordered in Other Visits  Medication Dose Route Frequency Provider Last Rate Last Admin   sodium chloride flush (NS) 0.9 % injection 10 mL  10 mL Intravenous PRN Curt Bears, MD        SURGICAL HISTORY:  Past Surgical History:  Procedure Laterality Date   ABDOMINAL HYSTERECTOMY     partial   AORTA - BILATERAL FEMORAL ARTERY BYPASS GRAFT Bilateral 06/14/2016   Procedure: AORTOBIFEMORAL BYPASS GRAFT AORTA SMA BYPASS GRAFT RE-IMPLANTATION  OF SUPERIOR MESENTERIC ARTERY;  Surgeon: Serafina Mitchell, MD;  Location: [facility];  Service: Vascular;  Laterality: Bilateral;   BACK SURGERY     L4/5   CARDIAC CATHETERIZATION N/A 01/03/2016   Procedure: Right/Left Heart Cath and Coronary Angiography;  Surgeon: Jolaine Artist, MD;  Location: [facility];  Service: Cardiovascular;  Laterality: N/A;   COLONOSCOPY N/A 08/05/2015   Procedure: COLONOSCOPY;  Surgeon: Rogene Houston, MD;  Location: [facility];  Service: Endoscopy;  Laterality: N/A;  730   ELBOW SURGERY     IR IMAGING GUIDED PORT INSERTION  02/16/2020   PERIPHERAL VASCULAR CATHETERIZATION N/A 02/16/2016   Procedure: Abdominal Aortogram w/Lower Extremity;  Surgeon: Wellington Hampshire, MD;  Location: [facility];  Service: Cardiovascular;  Laterality: N/A;    REVIEW OF SYSTEMS:  A comprehensive review of systems was negative except for: Constitutional: positive for fatigue Respiratory: positive for dyspnea on exertion   PHYSICAL EXAMINATION: General appearance: alert, cooperative, fatigued, and no distress Head: Normocephalic, without obvious abnormality, atraumatic Neck: no  adenopathy, no JVD, supple, symmetrical, trachea midline, and thyroid not enlarged, symmetric, no tenderness/mass/nodules Lymph nodes: Cervical, supraclavicular, and axillary nodes normal. Resp: clear to auscultation bilaterally Back: symmetric, no curvature. ROM normal. No CVA tenderness. Cardio: regular rate and rhythm, S1, S2 normal, no murmur, click, rub or gallop GI: soft, non-tender; bowel sounds normal; no masses,  no organomegaly Extremities: extremities normal, atraumatic, no cyanosis or edema    ECOG PERFORMANCE STATUS: 1 - Symptomatic but completely ambulatory  Blood pressure 90/61, pulse 91, temperature (!) 96.3 F (35.7 C), temperature source Tympanic, resp. rate 19, height $RemoveBe'5\' 7"'mfKjPFlXF$  (1.702 m), weight 136 lb 4.8 oz (61.8 kg), SpO2 94 %.  LABORATORY DATA: Lab Results  Component Value Date   WBC 4.9 09/05/2021   HGB 10.9 (L) 09/05/2021   HCT 34.7 (L) 09/05/2021   MCV 93.5 09/05/2021   PLT 206 09/05/2021      Chemistry      Component Value Date/Time   NA 141 09/05/2021 1011  K 3.7 09/05/2021 1011   CL 105 09/05/2021 1011   CO2 29 09/05/2021 1011   BUN 27 (H) 09/05/2021 1011   CREATININE 1.34 (H) 09/05/2021 1011   CREATININE 0.90 02/08/2016 0949      Component Value Date/Time   CALCIUM 8.8 (L) 09/05/2021 1011   ALKPHOS 57 09/05/2021 1011   AST 16 09/05/2021 1011   ALT 9 09/05/2021 1011   BILITOT 0.4 09/05/2021 1011       RADIOGRAPHIC STUDIES: CT Chest W Contrast  Result Date: 09/06/2021 CLINICAL DATA:  Primary Cancer Type: Lung Imaging Indication: Routine surveillance Interval therapy since last imaging? No Initial Cancer Diagnosis Date: 01/12/2020; Established by: Biopsy-proven Detailed Pathology: Stage IV non-small cell lung cancer, poorly differentiated adenocarcinoma. Primary Tumor location:  Left upper lobe. Surgeries: Aortobifemoral bypass graft.  Partial hysterectomy. Chemotherapy: Yes; Ongoing? No; Most recent administration: 07/15/2020 Immunotherapy?   Yes; Type: Keytruda; Ongoing? No Radiation therapy? Yes; Date Range: 02/19/2020 - 03/09/2020; Target: Left lung EXAM: CT CHEST, ABDOMEN, AND PELVIS WITH CONTRAST TECHNIQUE: Multidetector CT imaging of the chest, abdomen and pelvis was performed following the standard protocol during bolus administration of intravenous contrast. RADIATION DOSE REDUCTION: This exam was performed according to the departmental dose-optimization program which includes automated exposure control, adjustment of the mA and/or kV according to patient size and/or use of iterative reconstruction technique. CONTRAST:  58mL OMNIPAQUE IOHEXOL 300 MG/ML  SOLN COMPARISON:  Most recent CT chest, abdomen and pelvis 05/31/2021. 01/02/2020 PET-CT. FINDINGS: CT CHEST FINDINGS Cardiovascular: Calcified and noncalcified atheromatous plaque of the thoracic aorta without aneurysmal dilation. RIGHT-sided Port-A-Cath terminates at the caval to atrial junction. Small pericardial effusion not substantially changed from previous imaging. Central pulmonary vasculature is normal caliber. Mediastinum/Nodes: LEFT paramediastinal volume loss and soft tissue in the anterior chest is unchanged. No signs of hilar adenopathy, mediastinal or axillary lymphadenopathy. No thoracic inlet nodal enlargement. Lungs/Pleura: Volume loss in the LEFT upper lobe. Bronchiectatic changes and paramediastinal soft tissue density with no change compatible with post treatment changes. Marked pulmonary emphysema worse at the lung apices similar to previous imaging. No new or suspicious pulmonary nodules. Inspissated material seen in LEFT lower lobe bronchi without peripheral inflammatory changes not substantially changed from previous imaging. Musculoskeletal: See below for full musculoskeletal details. CT ABDOMEN PELVIS FINDINGS Hepatobiliary: Hepatic steatosis with smooth hepatic contours. No pericholecystic fluid or stranding. No biliary duct distension. Pancreas: Normal, without mass,  inflammation or ductal dilatation. Spleen: Normal. Adrenals/Urinary Tract: Adrenal glands are normal. Renal cortical scarring. No hydronephrosis. No perinephric stranding. No perivesical stranding. No suspicious renal lesion. Stomach/Bowel: Stomach is under distended. Moderately large duodenal diverticulum at the duodenal jejunal junction. No acute gastrointestinal findings. Moderate stool in the colon. Vascular/Lymphatic: Signs of extra anatomic bypass grafting involving visceral branches and aorto iliac grafting with similar appearance. Aortic atherosclerosis. No aneurysmal dilation. There is no gastrohepatic or hepatoduodenal ligament lymphadenopathy. No retroperitoneal or mesenteric lymphadenopathy. No pelvic sidewall lymphadenopathy. Reproductive: Post hysterectomy.  No adnexal masses. Other: No ascites.  No pneumoperitoneum. Musculoskeletal: Spinal degenerative changes without acute or destructive bone process. IMPRESSION: 1. Stable post treatment changes within the LEFT upper lobe. No evidence of recurrent or metastatic disease. 2. Hepatic steatosis. 3. Vascular disease with signs of aorto iliac extra anatomic bypass grafting and bypass grafting involving visceral branches as before. 4. Aortic atherosclerosis. Aortic Atherosclerosis (ICD10-I70.0) and Emphysema (ICD10-J43.9). Electronically Signed   By: Zetta Bills M.D.   On: 09/06/2021 12:22   CT Abdomen Pelvis W Contrast  Result Date: 09/06/2021  CLINICAL DATA:  Primary Cancer Type: Lung Imaging Indication: Routine surveillance Interval therapy since last imaging? No Initial Cancer Diagnosis Date: 01/12/2020; Established by: Biopsy-proven Detailed Pathology: Stage IV non-small cell lung cancer, poorly differentiated adenocarcinoma. Primary Tumor location:  Left upper lobe. Surgeries: Aortobifemoral bypass graft.  Partial hysterectomy. Chemotherapy: Yes; Ongoing? No; Most recent administration: 07/15/2020 Immunotherapy?  Yes; Type: Keytruda; Ongoing? No  Radiation therapy? Yes; Date Range: 02/19/2020 - 03/09/2020; Target: Left lung EXAM: CT CHEST, ABDOMEN, AND PELVIS WITH CONTRAST TECHNIQUE: Multidetector CT imaging of the chest, abdomen and pelvis was performed following the standard protocol during bolus administration of intravenous contrast. RADIATION DOSE REDUCTION: This exam was performed according to the departmental dose-optimization program which includes automated exposure control, adjustment of the mA and/or kV according to patient size and/or use of iterative reconstruction technique. CONTRAST:  77mL OMNIPAQUE IOHEXOL 300 MG/ML  SOLN COMPARISON:  Most recent CT chest, abdomen and pelvis 05/31/2021. 01/02/2020 PET-CT. FINDINGS: CT CHEST FINDINGS Cardiovascular: Calcified and noncalcified atheromatous plaque of the thoracic aorta without aneurysmal dilation. RIGHT-sided Port-A-Cath terminates at the caval to atrial junction. Small pericardial effusion not substantially changed from previous imaging. Central pulmonary vasculature is normal caliber. Mediastinum/Nodes: LEFT paramediastinal volume loss and soft tissue in the anterior chest is unchanged. No signs of hilar adenopathy, mediastinal or axillary lymphadenopathy. No thoracic inlet nodal enlargement. Lungs/Pleura: Volume loss in the LEFT upper lobe. Bronchiectatic changes and paramediastinal soft tissue density with no change compatible with post treatment changes. Marked pulmonary emphysema worse at the lung apices similar to previous imaging. No new or suspicious pulmonary nodules. Inspissated material seen in LEFT lower lobe bronchi without peripheral inflammatory changes not substantially changed from previous imaging. Musculoskeletal: See below for full musculoskeletal details. CT ABDOMEN PELVIS FINDINGS Hepatobiliary: Hepatic steatosis with smooth hepatic contours. No pericholecystic fluid or stranding. No biliary duct distension. Pancreas: Normal, without mass, inflammation or ductal  dilatation. Spleen: Normal. Adrenals/Urinary Tract: Adrenal glands are normal. Renal cortical scarring. No hydronephrosis. No perinephric stranding. No perivesical stranding. No suspicious renal lesion. Stomach/Bowel: Stomach is under distended. Moderately large duodenal diverticulum at the duodenal jejunal junction. No acute gastrointestinal findings. Moderate stool in the colon. Vascular/Lymphatic: Signs of extra anatomic bypass grafting involving visceral branches and aorto iliac grafting with similar appearance. Aortic atherosclerosis. No aneurysmal dilation. There is no gastrohepatic or hepatoduodenal ligament lymphadenopathy. No retroperitoneal or mesenteric lymphadenopathy. No pelvic sidewall lymphadenopathy. Reproductive: Post hysterectomy.  No adnexal masses. Other: No ascites.  No pneumoperitoneum. Musculoskeletal: Spinal degenerative changes without acute or destructive bone process. IMPRESSION: 1. Stable post treatment changes within the LEFT upper lobe. No evidence of recurrent or metastatic disease. 2. Hepatic steatosis. 3. Vascular disease with signs of aorto iliac extra anatomic bypass grafting and bypass grafting involving visceral branches as before. 4. Aortic atherosclerosis. Aortic Atherosclerosis (ICD10-I70.0) and Emphysema (ICD10-J43.9). Electronically Signed   By: Zetta Bills M.D.   On: 09/06/2021 12:22   DG Chest Port 1 View  Result Date: 08/21/2021 CLINICAL DATA:  Chest pain EXAM: PORTABLE CHEST 1 VIEW COMPARISON:  Previous chest radiographs including the examination of 01/12/2020 and CT chest done on 05/31/2021 FINDINGS: Cardiac size is within normal limits. Low position of diaphragms suggests COPD. Emphysematous changes are noted in the upper lung fields, more so on the right side. There is decreased volume in the left lung. Increased interstitial markings in the left upper lung fields may suggest scarring. Small transverse linear densities in the right lower lung fields suggest  scarring. There is no significant  pleural effusion or pneumothorax. Tip of right IJ chest port is seen in the superior vena cava. IMPRESSION: Severe COPD. There is decreased volume in the left lung which may be related to previous intervention. There is no new focal pulmonary consolidation. There are no signs of pulmonary edema. Interstitial markings in the left upper lung fields may suggest scarring. Small transverse linear densities in the right lower lung fields suggest scarring or subsegmental atelectasis. Electronically Signed   By: Elmer Picker M.D.   On: 08/21/2021 15:17     ASSESSMENT AND PLAN: This is a very pleasant 76 years old white female with a stage IV (T3, N2, M1a) non-small cell lung cancer, poorly differentiated adenocarcinoma diagnosed in June 2021 and presented with large left upper lobe lung mass with mediastinal invasion in addition to mediastinal lymphadenopathy as well as bilateral pulmonary nodules. The patient has no actionable mutations but PD-L1 expression is 100%. Because of the bulky disease I recommended for the patient to consider the combination chemotherapy.  She is interested in this option. She is currently being treated with carboplatin for AUC of 5, Alimta 500 mg/M2 and Keytruda 200 mg IV every 3 weeks status post 11 cycles.  Starting from cycle #5 she is on maintenance treatment with Alimta and Keytruda every 3 weeks.  Starting from cycle #9 she is on treatment with single agent Keytruda.  The patient had a break off treatment for few months after cycle #9 but she had some evidence for disease progression and she resumed her treatment. The patient tolerated the last cycle of her treatment well except for the recurrence of skin rash and itching again.  She decided not to proceed with any further treatment with immunotherapy. The patient has been on observation since that time and she is feeling fine. She had repeat CT scan of the chest, abdomen pelvis performed  recently.  I personally and independently reviewed the scans and discussed the result with the patient today. Her scan showed no concerning findings for disease recurrence or metastasis. I recommended for her to continue on observation with repeat CT scan of the chest, abdomen and pelvis in 3 months. Regarding the chest tightness and coronary atherosclerosis, the patient has an appointment with cardiology for evaluation. She was advised to call immediately if she has any other concerning symptoms in the interval.  The patient voices understanding of current disease status and treatment options and is in agreement with the current care plan. All questions were answered. The patient knows to call the clinic with any problems, questions or concerns. We can certainly see the patient much sooner if necessary.  Disclaimer: This note was dictated with voice recognition software. Similar sounding words can inadvertently be transcribed and may not be corrected upon review.

## 2021-09-13 DIAGNOSIS — J449 Chronic obstructive pulmonary disease, unspecified: Secondary | ICD-10-CM | POA: Diagnosis not present

## 2021-09-14 ENCOUNTER — Encounter: Payer: Self-pay | Admitting: Internal Medicine

## 2021-09-14 ENCOUNTER — Other Ambulatory Visit: Payer: Self-pay

## 2021-09-14 ENCOUNTER — Ambulatory Visit: Payer: Medicare Other | Admitting: Internal Medicine

## 2021-09-14 VITALS — BP 112/58 | HR 92 | Ht 68.0 in | Wt 138.0 lb

## 2021-09-14 DIAGNOSIS — C3490 Malignant neoplasm of unspecified part of unspecified bronchus or lung: Secondary | ICD-10-CM

## 2021-09-14 DIAGNOSIS — R079 Chest pain, unspecified: Secondary | ICD-10-CM

## 2021-09-14 MED ORDER — NITROGLYCERIN 0.4 MG SL SUBL: 0.4000 mg | SUBLINGUAL_TABLET | SUBLINGUAL | 0 refills | Status: DC | PRN

## 2021-09-14 NOTE — Patient Instructions (Addendum)
Testing/Procedures: ?Your physician has requested that you have an echocardiogram. Echocardiography is a painless test that uses sound waves to create images of your heart. It provides your doctor with information about the size and shape of your heart and how well your heart?s chambers and valves are working. This procedure takes approximately one hour. There are no restrictions for this procedure. ? ?Your physician has requested that you have a lexiscan myoview. For further information please visit [email or web address]. Please follow instruction sheet, as given. ? ?Follow-Up: ?Follow up in 6 months with Dr. Gasper Sells or APP ? ?Any Other Special Instructions Will Be Listed Below (If Applicable). ? ? ? ? ?If you need a refill on your cardiac medications before your next appointment, please call your pharmacy. ? ?

## 2021-09-14 NOTE — Progress Notes (Signed)
Cardiology Office Note:    Date:  09/14/2021   ID:  Kayla Sawyer, DOB 1946-01-28, MRN [id number]  PCP:  Celene Squibb, MD   [facility] Providers Cardiologist:  None     Referring MD: Celene Squibb, MD   CC: Chest Pain Consulted for the evaluation of Chest pain at the behest of Celene Squibb, MD  History of Present Illness:    Kayla Sawyer is a 76 y.o. female with a hx of COPD with secondary PH, active smoker on 4 L, NSCLC- adenocarcinoma on palliative radiotherapy and carboplatin and Keytruda therapy (stopped for non cardiac intolerance).  In 2017 had mild non obstructive CAD with no evidence of PAH, PAD with hx of Aortobifemoral bypass graft with aortic atherosclerosis.   Seen 09/14/21.   Patient notes that she is feeling better.  08/21/21 had chest pain that radiated into her throat.  Had never had this before.  Was playing cards before this happened. ED eval was largely benign..  Patient exertion notable for walking through her big house and needs to stop for SOB.  No shortness of breath at rest on oxygen.  No PND or orthopnea.  No weight gain no leg claudication.  No syncope or near syncope. Notes  no palpitations or funny heart beats.      Past Medical History:  Diagnosis Date   Arthritis    COPD (chronic obstructive pulmonary disease) (HCC)    Depression    E. coli pyelonephritis 6//2014   GERD (gastroesophageal reflux disease)    Pneumonia    Pulmonary hypertension (HCC)    Sepsis ([place])     Past Surgical History:  Procedure Laterality Date   ABDOMINAL HYSTERECTOMY     partial   AORTA - BILATERAL FEMORAL ARTERY BYPASS GRAFT Bilateral 06/14/2016   Procedure: AORTOBIFEMORAL BYPASS GRAFT AORTA SMA BYPASS GRAFT RE-IMPLANTATION  OF SUPERIOR MESENTERIC ARTERY;  Surgeon: Serafina Mitchell, MD;  Location: [facility] OR;  Service: Vascular;  Laterality: Bilateral;   BACK SURGERY     L4/5   CARDIAC CATHETERIZATION N/A 01/03/2016   Procedure: Right/Left Heart Cath and Coronary Angiography;   Surgeon: Jolaine Artist, MD;  Location: [place];  Service: Cardiovascular;  Laterality: N/A;   COLONOSCOPY N/A 08/05/2015   Procedure: COLONOSCOPY;  Surgeon: Rogene Houston, MD;  Location: [facility];  Service: Endoscopy;  Laterality: N/A;  730   ELBOW SURGERY     IR IMAGING GUIDED PORT INSERTION  02/16/2020   PERIPHERAL VASCULAR CATHETERIZATION N/A 02/16/2016   Procedure: Abdominal Aortogram w/Lower Extremity;  Surgeon: Wellington Hampshire, MD;  Location: [place] [facility];  Service: Cardiovascular;  Laterality: N/A;    Current Medications: Current Meds  Medication Sig   acetaminophen (TYLENOL) 500 MG tablet Take 500 mg by mouth every 6 (six) hours as needed for mild pain.   albuterol (PROVENTIL) (2.5 MG/3ML) 0.083% nebulizer solution Take 3 mLs (2.5 mg total) by nebulization every 6 (six) hours as needed for wheezing or shortness of breath.   aspirin EC 81 MG tablet Take 1 tablet (81 mg total) by mouth daily.   atorvastatin (LIPITOR) 40 MG tablet Take 40 mg by mouth daily.   betamethasone dipropionate 0.05 % cream Apply topically.   fluticasone (CUTIVATE) 0.05 % cream Apply topically.   Fluticasone-Umeclidin-Vilant (TRELEGY ELLIPTA) 100-62.5-25 MCG/ACT AEPB Inhale 1 puff into the lungs daily.   folic acid (FOLVITE) 1 MG tablet Take 1 tablet (1 mg total) by mouth daily.  ketoconazole (NIZORAL) 2 % cream Apply topically.   levocetirizine (XYZAL) 5 MG tablet Take 5 mg by mouth every evening.    lidocaine-prilocaine (EMLA) cream Apply to the Port-A-Cath site 30-60-minute before chemotherapy every 3 weeks.   Multiple Vitamin (MULTIVITAMIN WITH MINERALS) TABS tablet Take 1 tablet by mouth daily.   mupirocin ointment (BACTROBAN) 2 % SMARTSIG:1 Application Topical 2-3 Times Daily   MYRBETRIQ 25 MG TB24 tablet Take 25 mg by mouth daily.   omeprazole (PRILOSEC) 20 MG capsule Take 20 mg by mouth daily.    prochlorperazine (COMPAZINE) 10 MG tablet Take 1 tablet (10 mg total) by mouth  every 6 (six) hours as needed for nausea or vomiting.   sertraline (ZOLOFT) 100 MG tablet Take 100 mg by mouth daily.    triamcinolone ointment (KENALOG) 0.1 % SMARTSIG:1 Application Left Ear Twice Daily   [DISCONTINUED] nitroGLYCERIN (NITROSTAT) 0.4 MG SL tablet Place 1 tablet (0.4 mg total) under the tongue every 5 (five) minutes as needed for chest pain.     Allergies:   Elemental sulfur   Social History   Socioeconomic History   Marital status: Married    Spouse name: Not on file   Number of children: Not on file   Years of education: Not on file   Highest education level: Not on file  Occupational History   Not on file  Tobacco Use   Smoking status: Every Day    Packs/day: 0.50    Years: 50.00    Pack years: 25.00    Types: Cigarettes   Smokeless tobacco: Never   Tobacco comments:    smokes 1/2 pack per day- 08/31/2020  Vaping Use   Vaping Use: Never used  Substance and Sexual Activity   Alcohol use: No    Alcohol/week: 0.0 standard drinks   Drug use: No   Sexual activity: Never  Other Topics Concern   Not on file  Social History Narrative   Not on file   Social Determinants of Health   Financial Resource Strain: Not on file  Food Insecurity: Not on file  Transportation Needs: Not on file  Physical Activity: Not on file  Stress: Not on file  Social Connections: Not on file    Social: lives with son, DIL and husband, daughter is a Marine scientist  Family History: The patient's family history includes Diabetes in her mother; Emphysema in her father.  ROS:   Please see the history of present illness.     All other systems reviewed and are negative.  EKGs/Labs/Other Studies Reviewed:    The following studies were reviewed today:  EKG:   08/18/21: SR 75 borderline anterior infarct  Recent Labs: 10/13/2020: TSH 1.075 09/05/2021: ALT 9; BUN 27; Creatinine 1.34; Hemoglobin 10.9; Platelet Count 206; Potassium 3.7; Sodium 141  Recent Lipid Panel No results found for:  CHOL, TRIG, HDL, CHOLHDL, VLDL, LDLCALC, LDLDIRECT       Physical Exam:    VS:  BP (!) 112/58    Pulse 92    Ht 5\' 8"  (1.727 m)    Wt 138 lb (62.6 kg)    SpO2 98% Comment: 4 L [place] oxygen   BMI 20.98 kg/m     Wt Readings from Last 3 Encounters:  09/14/21 138 lb (62.6 kg)  09/08/21 136 lb 4.8 oz (61.8 kg)  08/21/21 137 lb (62.1 kg)    Gen: no distress  Neck: No JVD  Cardiac: No Rubs or Gallops, no murmur, RRR +2 radial pulses Respiratory: Clear to  auscultation bilaterally, normal effort, normal  respiratory rate GI: Soft, nontender, non-distended  MS: No edema;  moves all extremities Integument: Skin feels warm Neuro:  At time of evaluation, alert and oriented to person/place/time/situation  Psych: Normal affect, patient feels well   ASSESSMENT:    1. Malignant neoplasm of lung, unspecified laterality, unspecified part of lung ([place])   2. Chest pain of uncertain etiology   3. Chest pain, unspecified type    PLAN:    CAD mild non-obstructive and new CP PAD hx of aortobifemoral bypass graft COPD with secondary PH on 4 L O2 but no active wheezing NSCLC after cardiotoxic chemo, immunotherapy, and high dose radiation (palliative chem that exceed life expectancy) - continue PRN nitro - will do lexicsan- no active wheeze - Echo - we have discussed conservative management given her comorbidities; unless largely positive will plan for medical therapy  Six months me or APP       Shared Decision Making/Informed Consent The risks [chest pain, shortness of breath, cardiac arrhythmias, dizziness, blood pressure fluctuations, myocardial infarction, stroke/transient ischemic attack, nausea, vomiting, allergic reaction, radiation exposure, metallic taste sensation and life-threatening complications (estimated to be 1 in 10,000)], benefits (risk stratification, diagnosing coronary artery disease, treatment guidance) and alternatives of a nuclear stress test were discussed in detail with Ms.  Anselmo and she agrees to proceed.    Medication Adjustments/Labs and Tests Ordered: Current medicines are reviewed at length with the patient today.  Concerns regarding medicines are outlined above.  Orders Placed This Encounter  Procedures   NM Myocar Multi W/Spect W/Wall Motion / EF   Cardiac Stress Test: Informed Consent Details: Physician/Practitioner Attestation; Transcribe to consent form and obtain patient signature   ECHOCARDIOGRAM COMPLETE   Meds ordered this encounter  Medications   nitroGLYCERIN (NITROSTAT) 0.4 MG SL tablet    Sig: Place 1 tablet (0.4 mg total) under the tongue every 5 (five) minutes as needed for chest pain.    Dispense:  30 tablet    Refill:  0    Patient Instructions  Testing/Procedures: Your physician has requested that you have an echocardiogram. Echocardiography is a painless test that uses sound waves to create images of your heart. It provides your doctor with information about the size and shape of your heart and how well your hearts chambers and valves are working. This procedure takes approximately one hour. There are no restrictions for this procedure.  Your physician has requested that you have a lexiscan myoview. For further information please visit [email or web address]. Please follow instruction sheet, as given.  Follow-Up: Follow up in 6 months with Dr. Gasper Sells or APP  Any Other Special Instructions Will Be Listed Below (If Applicable).     If you need a refill on your cardiac medications before your next appointment, please call your pharmacy.    Signed, Werner Lean, MD  09/14/2021 12:29 PM    [place]

## 2021-09-19 DIAGNOSIS — E782 Mixed hyperlipidemia: Secondary | ICD-10-CM | POA: Diagnosis not present

## 2021-09-19 DIAGNOSIS — R7303 Prediabetes: Secondary | ICD-10-CM | POA: Diagnosis not present

## 2021-09-19 DIAGNOSIS — D6481 Anemia due to antineoplastic chemotherapy: Secondary | ICD-10-CM | POA: Diagnosis not present

## 2021-09-21 DIAGNOSIS — C3492 Malignant neoplasm of unspecified part of left bronchus or lung: Secondary | ICD-10-CM | POA: Diagnosis not present

## 2021-09-21 DIAGNOSIS — D6481 Anemia due to antineoplastic chemotherapy: Secondary | ICD-10-CM | POA: Diagnosis not present

## 2021-09-21 DIAGNOSIS — R7303 Prediabetes: Secondary | ICD-10-CM | POA: Diagnosis not present

## 2021-09-21 DIAGNOSIS — I739 Peripheral vascular disease, unspecified: Secondary | ICD-10-CM | POA: Diagnosis not present

## 2021-09-21 DIAGNOSIS — N1831 Chronic kidney disease, stage 3a: Secondary | ICD-10-CM | POA: Diagnosis not present

## 2021-09-21 DIAGNOSIS — J449 Chronic obstructive pulmonary disease, unspecified: Secondary | ICD-10-CM | POA: Diagnosis not present

## 2021-09-21 DIAGNOSIS — F17219 Nicotine dependence, cigarettes, with unspecified nicotine-induced disorders: Secondary | ICD-10-CM | POA: Diagnosis not present

## 2021-09-21 DIAGNOSIS — N3281 Overactive bladder: Secondary | ICD-10-CM | POA: Diagnosis not present

## 2021-09-21 DIAGNOSIS — K219 Gastro-esophageal reflux disease without esophagitis: Secondary | ICD-10-CM | POA: Diagnosis not present

## 2021-09-21 DIAGNOSIS — E782 Mixed hyperlipidemia: Secondary | ICD-10-CM | POA: Diagnosis not present

## 2021-09-21 DIAGNOSIS — J9611 Chronic respiratory failure with hypoxia: Secondary | ICD-10-CM | POA: Diagnosis not present

## 2021-09-26 DIAGNOSIS — M25512 Pain in left shoulder: Secondary | ICD-10-CM | POA: Diagnosis not present

## 2021-09-27 ENCOUNTER — Encounter (HOSPITAL_COMMUNITY): Payer: Self-pay

## 2021-09-27 ENCOUNTER — Other Ambulatory Visit: Payer: Self-pay

## 2021-09-27 ENCOUNTER — Encounter (HOSPITAL_BASED_OUTPATIENT_CLINIC_OR_DEPARTMENT_OTHER)
Admission: RE | Admit: 2021-09-27 | Discharge: 2021-09-27 | Disposition: A | Discharge status: Home / Self Care | Payer: Medicare Other | Source: Ambulatory Visit | Attending: Internal Medicine | Admitting: Internal Medicine

## 2021-09-27 ENCOUNTER — Ambulatory Visit (HOSPITAL_COMMUNITY)
Admission: RE | Admit: 2021-09-27 | Discharge: 2021-09-27 | Disposition: A | Discharge status: Home / Self Care | Payer: Medicare Other | Source: Ambulatory Visit | Attending: Internal Medicine | Admitting: Internal Medicine

## 2021-09-27 DIAGNOSIS — R079 Chest pain, unspecified: Secondary | ICD-10-CM | POA: Diagnosis not present

## 2021-09-27 LAB — NM MYOCAR MULTI W/SPECT W/WALL MOTION / EF
LV dias vol: 46 mL (ref 46–106)
LV sys vol: 12 mL
Nuc Stress EF: 73 %
Peak HR: 112 {beats}/min
RATE: 0.4
Rest HR: 88 {beats}/min
Rest Nuclear Isotope Dose: 10.3 mCi
SDS: 2
SRS: 0
SSS: 2
ST Depression (mm): 0 mm
Stress Nuclear Isotope Dose: 30.5 mCi
TID: 0.99

## 2021-09-27 MED ORDER — TECHNETIUM TC 99M TETROFOSMIN IV KIT
10.0000 | PACK | Freq: Once | INTRAVENOUS | Status: AC | PRN
  Administered 2021-09-27: 10.3 via INTRAVENOUS

## 2021-09-27 MED ORDER — REGADENOSON 0.4 MG/5ML IV SOLN
INTRAVENOUS | Status: AC
  Administered 2021-09-27: 0.4 mg via INTRAVENOUS
  Filled 2021-09-27: qty 5

## 2021-09-27 MED ORDER — SODIUM CHLORIDE FLUSH 0.9 % IV SOLN
INTRAVENOUS | Status: AC
  Administered 2021-09-27: 10 mL via INTRAVENOUS
  Filled 2021-09-27: qty 10

## 2021-09-27 MED ORDER — TECHNETIUM TC 99M TETROFOSMIN IV KIT
30.0000 | PACK | Freq: Once | INTRAVENOUS | Status: AC | PRN
  Administered 2021-09-27: 30.5 via INTRAVENOUS

## 2021-10-05 ENCOUNTER — Encounter: Payer: Self-pay | Admitting: Internal Medicine

## 2021-10-07 ENCOUNTER — Ambulatory Visit (HOSPITAL_COMMUNITY)
Admission: RE | Admit: 2021-10-07 | Discharge: 2021-10-07 | Disposition: A | Discharge status: Home / Self Care | Payer: Medicare Other | Source: Ambulatory Visit | Attending: Internal Medicine | Admitting: Internal Medicine

## 2021-10-07 DIAGNOSIS — R079 Chest pain, unspecified: Secondary | ICD-10-CM | POA: Insufficient documentation

## 2021-10-07 DIAGNOSIS — C3490 Malignant neoplasm of unspecified part of unspecified bronchus or lung: Secondary | ICD-10-CM | POA: Diagnosis not present

## 2021-10-07 LAB — ECHOCARDIOGRAM COMPLETE
Area-P 1/2: 4.8 cm2
S' Lateral: 2.6 cm

## 2021-10-07 NOTE — Progress Notes (Signed)
*  PRELIMINARY RESULTS* ?Echocardiogram ?2D Echocardiogram has been performed. ? ?Kayla Sawyer ?10/07/2021, 11:15 AM ?

## 2021-10-14 ENCOUNTER — Encounter: Payer: Self-pay | Admitting: Podiatry

## 2021-10-14 ENCOUNTER — Ambulatory Visit: Payer: Medicare Other | Admitting: Podiatry

## 2021-10-14 DIAGNOSIS — J449 Chronic obstructive pulmonary disease, unspecified: Secondary | ICD-10-CM | POA: Diagnosis not present

## 2021-10-14 DIAGNOSIS — B351 Tinea unguium: Secondary | ICD-10-CM | POA: Diagnosis not present

## 2021-10-14 DIAGNOSIS — M79674 Pain in right toe(s): Secondary | ICD-10-CM

## 2021-10-14 DIAGNOSIS — I999 Unspecified disorder of circulatory system: Secondary | ICD-10-CM

## 2021-10-14 DIAGNOSIS — M79675 Pain in left toe(s): Secondary | ICD-10-CM

## 2021-10-14 NOTE — Progress Notes (Signed)
Subjective:  ? ?Patient ID: Kayla Sawyer, female   DOB: 76 y.o.   MRN: [id number]  ? ?HPI ?Patient presents with severe nail disease of both feet with patient on chemotherapy and other medications and still smokes a half a pack per day and is also on oxygen.  Patient is not active ? ? ?Review of Systems  ?All other systems reviewed and are negative. ? ? ?   ?Objective:  ?Physical Exam ?Vitals and nursing note reviewed.  ?Constitutional:   ?   Appearance: She is well-developed.  ?Pulmonary:  ?   Effort: Pulmonary effort is normal.  ?Musculoskeletal:     ?   General: Normal range of motion.  ?Skin: ?   General: Skin is warm.  ?Neurological:  ?   Mental Status: She is alert.  ?  ?Vascular significantly diminished with diminished DP PT pulses which they are aware of with patient in very frail health and has severe nail disease 1-5 both feet thickened dystrophic nailbeds and has pain with palpation.  Patient has reduced digital perfusion  ? ?   ?Assessment:  ?Chronic mycotic nail infection 1-5 both feet with poor health condition ? ?   ?Plan:  ?Aggressive debridement nailbeds no iatrogenic bleeding this can be done routinely and patient will continue with monitoring of tissue ?   ? ? ?

## 2021-11-13 DIAGNOSIS — J449 Chronic obstructive pulmonary disease, unspecified: Secondary | ICD-10-CM | POA: Diagnosis not present

## 2021-12-05 ENCOUNTER — Other Ambulatory Visit: Payer: Self-pay

## 2021-12-05 ENCOUNTER — Ambulatory Visit (HOSPITAL_COMMUNITY)
Admission: RE | Admit: 2021-12-05 | Discharge: 2021-12-05 | Disposition: A | Discharge status: Home / Self Care | Payer: Medicare Other | Source: Ambulatory Visit | Attending: Internal Medicine | Admitting: Internal Medicine

## 2021-12-05 ENCOUNTER — Inpatient Hospital Stay: Payer: Medicare Other | Attending: Internal Medicine

## 2021-12-05 DIAGNOSIS — C3412 Malignant neoplasm of upper lobe, left bronchus or lung: Secondary | ICD-10-CM | POA: Diagnosis not present

## 2021-12-05 DIAGNOSIS — Z08 Encounter for follow-up examination after completed treatment for malignant neoplasm: Secondary | ICD-10-CM | POA: Diagnosis not present

## 2021-12-05 DIAGNOSIS — Z85118 Personal history of other malignant neoplasm of bronchus and lung: Secondary | ICD-10-CM | POA: Diagnosis present

## 2021-12-05 DIAGNOSIS — K5730 Diverticulosis of large intestine without perforation or abscess without bleeding: Secondary | ICD-10-CM | POA: Diagnosis not present

## 2021-12-05 DIAGNOSIS — C3490 Malignant neoplasm of unspecified part of unspecified bronchus or lung: Secondary | ICD-10-CM | POA: Insufficient documentation

## 2021-12-05 DIAGNOSIS — J439 Emphysema, unspecified: Secondary | ICD-10-CM | POA: Diagnosis not present

## 2021-12-05 LAB — CBC WITH DIFFERENTIAL (CANCER CENTER ONLY)
Abs Immature Granulocytes: 0.04 10*3/uL (ref 0.00–0.07)
Basophils Absolute: 0.1 10*3/uL (ref 0.0–0.1)
Basophils Relative: 1 %
Eosinophils Absolute: 0.1 10*3/uL (ref 0.0–0.5)
Eosinophils Relative: 1 %
HCT: 33.9 % — ABNORMAL LOW (ref 36.0–46.0)
Hemoglobin: 11.1 g/dL — ABNORMAL LOW (ref 12.0–15.0)
Immature Granulocytes: 1 %
Lymphocytes Relative: 18 %
Lymphs Abs: 1.2 10*3/uL (ref 0.7–4.0)
MCH: 30.6 pg (ref 26.0–34.0)
MCHC: 32.7 g/dL (ref 30.0–36.0)
MCV: 93.4 fL (ref 80.0–100.0)
Monocytes Absolute: 0.7 10*3/uL (ref 0.1–1.0)
Monocytes Relative: 10 %
Neutro Abs: 4.8 10*3/uL (ref 1.7–7.7)
Neutrophils Relative %: 69 %
Platelet Count: 186 10*3/uL (ref 150–400)
RBC: 3.63 MIL/uL — ABNORMAL LOW (ref 3.87–5.11)
RDW: 14.7 % (ref 11.5–15.5)
WBC Count: 6.9 10*3/uL (ref 4.0–10.5)
nRBC: 0 % (ref 0.0–0.2)

## 2021-12-05 LAB — CMP (CANCER CENTER ONLY)
ALT: 10 U/L (ref 0–44)
AST: 17 U/L (ref 15–41)
Albumin: 4 g/dL (ref 3.5–5.0)
Alkaline Phosphatase: 53 U/L (ref 38–126)
Anion gap: 7 (ref 5–15)
BUN: 27 mg/dL — ABNORMAL HIGH (ref 8–23)
CO2: 29 mmol/L (ref 22–32)
Calcium: 8.9 mg/dL (ref 8.9–10.3)
Chloride: 106 mmol/L (ref 98–111)
Creatinine: 1.16 mg/dL — ABNORMAL HIGH (ref 0.44–1.00)
GFR, Estimated: 49 mL/min — ABNORMAL LOW (ref >60)
Glucose, Bld: 89 mg/dL (ref 70–99)
Potassium: 3.6 mmol/L (ref 3.5–5.1)
Sodium: 142 mmol/L (ref 135–145)
Total Bilirubin: 0.4 mg/dL (ref 0.3–1.2)
Total Protein: 6.7 g/dL (ref 6.5–8.1)

## 2021-12-05 MED ORDER — IOHEXOL 300 MG/ML  SOLN
100.0000 mL | Freq: Once | INTRAMUSCULAR | Status: AC | PRN
  Administered 2021-12-05: 75 mL via INTRAVENOUS

## 2021-12-05 MED ORDER — SODIUM CHLORIDE (PF) 0.9 % IJ SOLN
INTRAMUSCULAR | Status: AC
  Filled 2021-12-05: qty 50

## 2021-12-07 ENCOUNTER — Inpatient Hospital Stay: Payer: Medicare Other | Admitting: Internal Medicine

## 2021-12-07 ENCOUNTER — Other Ambulatory Visit: Payer: Self-pay

## 2021-12-07 VITALS — BP 100/69 | HR 94 | Temp 97.7°F | Resp 18 | Wt 136.6 lb

## 2021-12-07 DIAGNOSIS — C3490 Malignant neoplasm of unspecified part of unspecified bronchus or lung: Secondary | ICD-10-CM

## 2021-12-07 DIAGNOSIS — C3492 Malignant neoplasm of unspecified part of left bronchus or lung: Secondary | ICD-10-CM | POA: Diagnosis not present

## 2021-12-07 DIAGNOSIS — C3412 Malignant neoplasm of upper lobe, left bronchus or lung: Secondary | ICD-10-CM | POA: Diagnosis not present

## 2021-12-07 DIAGNOSIS — Z08 Encounter for follow-up examination after completed treatment for malignant neoplasm: Secondary | ICD-10-CM | POA: Diagnosis not present

## 2021-12-07 NOTE — Progress Notes (Signed)
[facility] [phone]   Fax:([phone]  OFFICE PROGRESS NOTE  Celene Squibb, MD [address] [place] [address]  DIAGNOSIS: Stage IV (T3, N2, M1a) non-small cell lung cancer, poorly differentiated adenocarcinoma presented with large left upper lobe lung mass in addition to mediastinal lymphadenopathy and bilateral pulmonary nodules diagnosed in June 2021.  Biomarker Findings Tumor Mutational Burden - 10 Muts/Mb Microsatellite status - MS-Stable Genomic Findings For a complete list of the genes assayed, please refer to the Appendix. NF1 Q2582* KRAS G12V CTNNB1 A667f*10 TP53 H179N 7 Disease relevant genes with no reportable alterations: ALK, BRAF, EGFR, ERBB2, MET, RET, ROS1  PDL1 Expression: 100%  PRIOR THERAPY:  1) Palliative radiotherapy to the large left upper lobe lung mass. 2) Systemic chemotherapy with carboplatin for AUC of 5, Alimta 500 mg/M2 and Keytruda 200 mg IV every 3 weeks.  First dose 02/12/2020.  Status post 11 cycles.  Starting from cycle #5 she was on maintenance treatment with Alimta and Keytruda every 3 weeks.  Starting cycle #9 she will be on single agent Keytruda every 3 weeks.  Her treatment is currently on hold secondary to significant skin rash.  Last dose was giving December 29, 2020.  CURRENT THERAPY: Observation.  INTERVAL HISTORY: Kayla MACFARLANE76y.o. female returns to the clinic today for follow-up visit accompanied by her husband.  The patient is feeling fine today with no concerning complaints except for the baseline shortness of breath and she is currently on home oxygen.  She denied having any recent weight loss or night sweats.  She has no nausea, vomiting, diarrhea or constipation.  She has no chest pain, cough or hemoptysis.  She has no fever or chills.  She has been in observation now for several months and feeling fine.  The patient had repeat CT scan of the chest, abdomen and pelvis performed recently and  she is here for evaluation and discussion of her scan results.  MEDICAL HISTORY: Past Medical History:  Diagnosis Date   Arthritis    COPD (chronic obstructive pulmonary disease) ([facility]    Depression    E. coli pyelonephritis 6//2014   GERD (gastroesophageal reflux disease)    Pneumonia    Pulmonary hypertension (HCC)    Sepsis (HCC)     ALLERGIES:  is allergic to elemental sulfur.  MEDICATIONS:  Current Outpatient Medications  Medication Sig Dispense Refill   acetaminophen (TYLENOL) 500 MG tablet Take 500 mg by mouth every 6 (six) hours as needed for mild pain.     albuterol (PROVENTIL) (2.5 MG/3ML) 0.083% nebulizer solution Take 3 mLs (2.5 mg total) by nebulization every 6 (six) hours as needed for wheezing or shortness of breath. 75 mL 12   aspirin EC 81 MG tablet Take 1 tablet (81 mg total) by mouth daily.     atorvastatin (LIPITOR) 40 MG tablet Take 40 mg by mouth daily.     betamethasone dipropionate 0.05 % cream Apply topically.     fluticasone (CUTIVATE) 0.05 % cream Apply topically.     Fluticasone-Umeclidin-Vilant (TRELEGY ELLIPTA) 100-62.5-25 MCG/ACT AEPB Inhale 1 puff into the lungs daily. 60 each 11   folic acid (FOLVITE) 1 MG tablet Take 1 tablet (1 mg total) by mouth daily. 30 tablet 4   ketoconazole (NIZORAL) 2 % cream Apply topically.     levocetirizine (XYZAL) 5 MG tablet Take 5 mg by mouth every evening.      lidocaine-prilocaine (EMLA) cream Apply  to the Port-A-Cath site 30-60-minute before chemotherapy every 3 weeks. 30 g 0   Multiple Vitamin (MULTIVITAMIN WITH MINERALS) TABS tablet Take 1 tablet by mouth daily.     mupirocin ointment (BACTROBAN) 2 % SMARTSIG:1 Application Topical 2-3 Times Daily     MYRBETRIQ 25 MG TB24 tablet Take 25 mg by mouth daily.     nitroGLYCERIN (NITROSTAT) 0.4 MG SL tablet Place 1 tablet (0.4 mg total) under the tongue every 5 (five) minutes as needed for chest pain. 30 tablet 0   omeprazole (PRILOSEC) 20 MG capsule Take 20 mg by  mouth daily.      prochlorperazine (COMPAZINE) 10 MG tablet Take 1 tablet (10 mg total) by mouth every 6 (six) hours as needed for nausea or vomiting. 30 tablet 0   sertraline (ZOLOFT) 100 MG tablet Take 100 mg by mouth daily.      triamcinolone ointment (KENALOG) 0.1 % SMARTSIG:1 Application Left Ear Twice Daily     No current facility-administered medications for this visit.   Facility-Administered Medications Ordered in Other Visits  Medication Dose Route Frequency Provider Last Rate Last Admin   sodium chloride flush (NS) 0.9 % injection 10 mL  10 mL Intravenous PRN Curt Bears, MD        SURGICAL HISTORY:  Past Surgical History:  Procedure Laterality Date   ABDOMINAL HYSTERECTOMY     partial   AORTA - BILATERAL FEMORAL ARTERY BYPASS GRAFT Bilateral 06/14/2016   Procedure: AORTOBIFEMORAL BYPASS GRAFT AORTA SMA BYPASS GRAFT RE-IMPLANTATION  OF SUPERIOR MESENTERIC ARTERY;  Surgeon: Serafina Mitchell, MD;  Location: [facility];  Service: Vascular;  Laterality: Bilateral;   BACK SURGERY     L4/5   CARDIAC CATHETERIZATION N/A 01/03/2016   Procedure: Right/Left Heart Cath and Coronary Angiography;  Surgeon: Jolaine Artist, MD;  Location: [facility];  Service: Cardiovascular;  Laterality: N/A;   COLONOSCOPY N/A 08/05/2015   Procedure: COLONOSCOPY;  Surgeon: Rogene Houston, MD;  [facility]: [facility];  Service: Endoscopy;  Laterality: N/A;  730   ELBOW SURGERY     IR IMAGING GUIDED PORT INSERTION  02/16/2020   PERIPHERAL VASCULAR CATHETERIZATION N/A 02/16/2016   Procedure: Abdominal Aortogram w/Lower Extremity;  Surgeon: Wellington Hampshire, MD;  Location: [facility];  Service: Cardiovascular;  Laterality: N/A;    REVIEW OF SYSTEMS:  Constitutional: positive for fatigue Eyes: negative Ears, nose, mouth, throat, and face: negative Respiratory: positive for dyspnea on exertion Cardiovascular: negative Gastrointestinal: negative Genitourinary:negative Integument/breast:  negative Hematologic/lymphatic: negative Musculoskeletal:positive for muscle weakness Neurological: negative Behavioral/Psych: negative Endocrine: negative Allergic/Immunologic: negative   PHYSICAL EXAMINATION: General appearance: alert, cooperative, fatigued, and no distress Head: Normocephalic, without obvious abnormality, atraumatic Neck: no adenopathy, no JVD, supple, symmetrical, trachea midline, and thyroid not enlarged, symmetric, no tenderness/mass/nodules Lymph nodes: Cervical, supraclavicular, and axillary nodes normal. Resp: clear to auscultation bilaterally Back: symmetric, no curvature. ROM normal. No CVA tenderness. Cardio: regular rate and rhythm, S1, S2 normal, no murmur, click, rub or gallop GI: soft, non-tender; bowel sounds normal; no masses,  no organomegaly Extremities: extremities normal, atraumatic, no cyanosis or edema Neurologic: Alert and oriented X 3, normal strength and tone. Normal symmetric reflexes. Normal coordination and gait    ECOG PERFORMANCE STATUS: 1 - Symptomatic but completely ambulatory  Blood pressure 100/69, pulse 94, temperature 97.7 F (36.5 C), temperature source Oral, resp. rate 18, weight 136 lb 9 oz (61.9 kg), SpO2 100 %.  LABORATORY DATA: Lab Results  Component Value Date  WBC 6.9 12/05/2021   HGB 11.1 (L) 12/05/2021   HCT 33.9 (L) 12/05/2021   MCV 93.4 12/05/2021   PLT 186 12/05/2021      Chemistry      Component Value Date/Time   NA 142 12/05/2021 1120   K 3.6 12/05/2021 1120   CL 106 12/05/2021 1120   CO2 29 12/05/2021 1120   BUN 27 (H) 12/05/2021 1120   CREATININE 1.16 (H) 12/05/2021 1120   CREATININE 0.90 02/08/2016 0949      Component Value Date/Time   CALCIUM 8.9 12/05/2021 1120   ALKPHOS 53 12/05/2021 1120   AST 17 12/05/2021 1120   ALT 10 12/05/2021 1120   BILITOT 0.4 12/05/2021 1120       RADIOGRAPHIC STUDIES: CT Chest W Contrast  Result Date: 12/06/2021 CLINICAL DATA:  Metastatic lung cancer  restaging, ongoing chemotherapy and immunotherapy EXAM: CT CHEST, ABDOMEN, AND PELVIS WITH CONTRAST TECHNIQUE: Multidetector CT imaging of the chest, abdomen and pelvis was performed following the standard protocol during bolus administration of intravenous contrast. RADIATION DOSE REDUCTION: This exam was performed according to the departmental dose-optimization program which includes automated exposure control, adjustment of the mA and/or kV according to patient size and/or use of iterative reconstruction technique. CONTRAST:  60m OMNIPAQUE IOHEXOL 300 MG/ML SOLN, additional oral enteric contrast COMPARISON:  09/05/2021 FINDINGS: CT CHEST FINDINGS Cardiovascular: Right chest port catheter. Aortic atherosclerosis. Normal heart size. Three-vessel coronary artery calcifications. Unchanged small pericardial effusion. Mediastinum/Nodes: No enlarged mediastinal, hilar, or axillary lymph nodes. Thyroid gland, trachea, and esophagus demonstrate no significant findings. Lungs/Pleura: Severe emphysema. Unchanged post treatment/post radiation appearance of the left chest with fibrosis and consolidation of the anterior left upper lobe and lingula (series 4, image 67). No pleural effusion or pneumothorax. Musculoskeletal: No chest wall mass or suspicious osseous lesions identified. CT ABDOMEN PELVIS FINDINGS Hepatobiliary: No solid liver abnormality is seen. No gallstones, gallbladder wall thickening, or biliary dilatation. Pancreas: Unremarkable. No pancreatic ductal dilatation or surrounding inflammatory changes. Spleen: Normal in size without significant abnormality. Adrenals/Urinary Tract: Adrenal glands are unremarkable. Kidneys are normal, without renal calculi, solid lesion, or hydronephrosis. Bladder is unremarkable. Stomach/Bowel: Stomach is within normal limits. Appendix appears normal. No evidence of bowel wall thickening, distention, or inflammatory changes. Occasional sigmoid diverticula. Moderate burden of stool  throughout the colon. Vascular/Lymphatic: Severe aortic atherosclerosis. Status post aortobifemoral bypass graft. No enlarged abdominal or pelvic lymph nodes. Reproductive: Status post hysterectomy. Other: No abdominal wall hernia or abnormality. No ascites. Musculoskeletal: No acute osseous findings. IMPRESSION: 1. Unchanged post treatment/post radiation appearance of the left chest with fibrosis and consolidation of the anterior left upper lobe and lingula. 2. No evidence of recurrent or metastatic disease in the chest, abdomen, or pelvis. 3. Severe emphysema. 4. Status post aortobifemoral bypass graft. 5. Coronary artery disease. Aortic Atherosclerosis (ICD10-I70.0) and Emphysema (ICD10-J43.9). Electronically Signed   By: ADelanna AhmadiM.D.   On: 12/06/2021 10:50   CT Abdomen Pelvis W Contrast  Result Date: 12/06/2021 CLINICAL DATA:  Metastatic lung cancer restaging, ongoing chemotherapy and immunotherapy EXAM: CT CHEST, ABDOMEN, AND PELVIS WITH CONTRAST TECHNIQUE: Multidetector CT imaging of the chest, abdomen and pelvis was performed following the standard protocol during bolus administration of intravenous contrast. RADIATION DOSE REDUCTION: This exam was performed according to the departmental dose-optimization program which includes automated exposure control, adjustment of the mA and/or kV according to patient size and/or use of iterative reconstruction technique. CONTRAST:  740mOMNIPAQUE IOHEXOL 300 MG/ML SOLN, additional oral enteric contrast COMPARISON:  09/05/2021 FINDINGS: CT CHEST FINDINGS Cardiovascular: Right chest port catheter. Aortic atherosclerosis. Normal heart size. Three-vessel coronary artery calcifications. Unchanged small pericardial effusion. Mediastinum/Nodes: No enlarged mediastinal, hilar, or axillary lymph nodes. Thyroid gland, trachea, and esophagus demonstrate no significant findings. Lungs/Pleura: Severe emphysema. Unchanged post treatment/post radiation appearance of the left  chest with fibrosis and consolidation of the anterior left upper lobe and lingula (series 4, image 67). No pleural effusion or pneumothorax. Musculoskeletal: No chest wall mass or suspicious osseous lesions identified. CT ABDOMEN PELVIS FINDINGS Hepatobiliary: No solid liver abnormality is seen. No gallstones, gallbladder wall thickening, or biliary dilatation. Pancreas: Unremarkable. No pancreatic ductal dilatation or surrounding inflammatory changes. Spleen: Normal in size without significant abnormality. Adrenals/Urinary Tract: Adrenal glands are unremarkable. Kidneys are normal, without renal calculi, solid lesion, or hydronephrosis. Bladder is unremarkable. Stomach/Bowel: Stomach is within normal limits. Appendix appears normal. No evidence of bowel wall thickening, distention, or inflammatory changes. Occasional sigmoid diverticula. Moderate burden of stool throughout the colon. Vascular/Lymphatic: Severe aortic atherosclerosis. Status post aortobifemoral bypass graft. No enlarged abdominal or pelvic lymph nodes. Reproductive: Status post hysterectomy. Other: No abdominal wall hernia or abnormality. No ascites. Musculoskeletal: No acute osseous findings. IMPRESSION: 1. Unchanged post treatment/post radiation appearance of the left chest with fibrosis and consolidation of the anterior left upper lobe and lingula. 2. No evidence of recurrent or metastatic disease in the chest, abdomen, or pelvis. 3. Severe emphysema. 4. Status post aortobifemoral bypass graft. 5. Coronary artery disease. Aortic Atherosclerosis (ICD10-I70.0) and Emphysema (ICD10-J43.9). Electronically Signed   By: Delanna Ahmadi M.D.   On: 12/06/2021 10:50     ASSESSMENT AND PLAN: This is a very pleasant 76 years old white female with a stage IV (T3, N2, M1a) non-small cell lung cancer, poorly differentiated adenocarcinoma diagnosed in June 2021 and presented with large left upper lobe lung mass with mediastinal invasion in addition to  mediastinal lymphadenopathy as well as bilateral pulmonary nodules. The patient has no actionable mutations but PD-L1 expression is 100%. Because of the bulky disease I recommended for the patient to consider the combination chemotherapy.  She is interested in this option. She is currently being treated with carboplatin for AUC of 5, Alimta 500 mg/M2 and Keytruda 200 mg IV every 3 weeks status post 11 cycles.  Starting from cycle #5 she is on maintenance treatment with Alimta and Keytruda every 3 weeks.  Starting from cycle #9 she is on treatment with single agent Keytruda.  The patient had a break off treatment for few months after cycle #9 but she had some evidence for disease progression and she resumed her treatment. The patient tolerated the last cycle of her treatment well except for the recurrence of skin rash and itching again.  She decided not to proceed with any further treatment with immunotherapy. The patient has been on observation for close to 1 year now with no concerning issues. She had repeat CT scan of the chest, abdomen and pelvis performed recently.  I personally and independently reviewed the scan and discussed the result with the patient and her husband. Her scan showed no concerning findings for disease progression. I recommended for the patient to continue on observation with repeat CT scan of the chest, abdomen and pelvis in 4 months. For the shortness of breath and COPD, she is followed by pulmonary medicine. The patient was advised to call immediately if she has any other concerning symptoms in the interval. The patient voices understanding of current disease status and treatment options and  is in agreement with the current care plan. All questions were answered. The patient knows to call the clinic with any problems, questions or concerns. We can certainly see the patient much sooner if necessary.  Disclaimer: This note was dictated with voice recognition software. Similar  sounding words can inadvertently be transcribed and may not be corrected upon review.

## 2021-12-14 DIAGNOSIS — J449 Chronic obstructive pulmonary disease, unspecified: Secondary | ICD-10-CM | POA: Diagnosis not present

## 2022-01-13 DIAGNOSIS — J449 Chronic obstructive pulmonary disease, unspecified: Secondary | ICD-10-CM | POA: Diagnosis not present

## 2022-01-20 ENCOUNTER — Ambulatory Visit (INDEPENDENT_AMBULATORY_CARE_PROVIDER_SITE_OTHER): Payer: Medicare Other | Admitting: Podiatry

## 2022-01-20 ENCOUNTER — Encounter: Payer: Self-pay | Admitting: Podiatry

## 2022-01-20 DIAGNOSIS — I999 Unspecified disorder of circulatory system: Secondary | ICD-10-CM | POA: Diagnosis not present

## 2022-01-20 DIAGNOSIS — M79675 Pain in left toe(s): Secondary | ICD-10-CM | POA: Diagnosis not present

## 2022-01-20 DIAGNOSIS — M79674 Pain in right toe(s): Secondary | ICD-10-CM

## 2022-01-20 DIAGNOSIS — B351 Tinea unguium: Secondary | ICD-10-CM

## 2022-01-20 NOTE — Progress Notes (Signed)
This patient returns to my office for at risk foot care.  This patient requires this care by a professional since this patient will be at risk due to having PAD.  This patient is unable to cut nails himself since the patient cannot reach his nails.These nails are painful walking and wearing shoes.  This patient presents for at risk foot care today.  General Appearance  Alert, conversant and in no acute stress.  Vascular  Dorsalis pedis and posterior tibial  pulses are  weakly palpable  bilaterally.  Capillary return is within normal limits  bilaterally. Temperature is within normal limits  bilaterally.  Neurologic  Senn-Weinstein monofilament wire test within normal limits  bilaterally. Muscle power within normal limits bilaterally.  Nails Thick disfigured discolored nails with subungual debris  from hallux to fifth toes bilaterally. No evidence of bacterial infection or drainage bilaterally.  Orthopedic  No limitations of motion  feet .  No crepitus or effusions noted.  No bony pathology or digital deformities noted.  Skin  normotropic skin with no porokeratosis noted bilaterally.  No signs of infections or ulcers noted.     Onychomycosis  Pain in right toes  Pain in left toes  Consent was obtained for treatment procedures.   Mechanical debridement of nails 1-5  bilaterally performed with a nail nipper.  Filed with dremel without incident.    Return office visit    4 months                  Told patient to return for periodic foot care and evaluation due to potential at risk complications.   Gardiner Barefoot DPM

## 2022-02-02 ENCOUNTER — Telehealth: Payer: Self-pay | Admitting: Internal Medicine

## 2022-02-02 NOTE — Telephone Encounter (Signed)
Called patient regarding upcoming September appointments, patient is notified.

## 2022-02-07 ENCOUNTER — Encounter: Payer: Self-pay | Admitting: Pulmonary Disease

## 2022-02-07 ENCOUNTER — Ambulatory Visit: Payer: Medicare Other | Admitting: Pulmonary Disease

## 2022-02-07 DIAGNOSIS — J9611 Chronic respiratory failure with hypoxia: Secondary | ICD-10-CM

## 2022-02-07 DIAGNOSIS — J432 Centrilobular emphysema: Secondary | ICD-10-CM | POA: Diagnosis not present

## 2022-02-07 DIAGNOSIS — C3492 Malignant neoplasm of unspecified part of left bronchus or lung: Secondary | ICD-10-CM | POA: Diagnosis not present

## 2022-02-07 NOTE — Assessment & Plan Note (Signed)
Continue Trelegy. Use albuterol for rescue

## 2022-02-07 NOTE — Patient Instructions (Signed)
  Continue on trelegy

## 2022-02-07 NOTE — Progress Notes (Signed)
   Subjective:    Patient ID: Kayla Sawyer, female    DOB: [birth year], 76 y.o.   MRN: [id number]  HPI  76 yo smoker for follow-up of COPD, lung cancer and chronic hypoxic respiratory failure Presented 11/2019 left upper lobe lung mass, hypermetabolic with scattered bilateral nodules and hilar and mediastinal lymphadenopathy >>   Stage IV (T3, N2, M1a) non-small cell lung cancer, poorly differentiated adenocarcinoma , PDL1 Expression: 100%   S/p palliative radiotherapy Systemic chemotherapy with carboplatin , Alimta and Keytruda  every 3 weeks first dose 01/2020  disease progression after stopping Keytruda  (skin rash ) -off Rx since 12/2020, under observation  Chief Complaint  Patient presents with   Follow-up    Breathing is about the same since last ov.    59-monthfollow-up visit. She arrives in a wheelchair on portable oxygen, accompanied by her husband. She continues to smoke half pack per day.  Has been is going to undergo carotid surgery and she wonders if she can have a motorized wheelchair.  I reviewed ED visit 08/2021 for chest pain, subsequent cardiology evaluation showed normal echo and low risk nuclear stress test  Breathing is stable.  She is compliant with Trelegy.  Not very active stays in the wheelchair most times I reviewed oncology consultation in CT chest/abdomen/pelvis from May 2023  Significant tests/ events reviewed   07/2021 she needs continuous oxygen and POC even at 4 L is suboptimal on exertion although this seems to work at rest when she is in the wheelchair.  This does increase her ability to go outside so we will have her continue  CT chest/abd/pelvis 11/2021 No evidence of recurrent or metastatic disease    PFTs 11/2015 showed ratio 52 with FEV1 53%, FVC 77%, paradoxical drop with bronhodilator, TLC 99% & DLCO 29%   HRCT 07/2016 RUL nodule regressed, no ILD, moderate centrilobular and paraseptal emphysema   11/2019 she did not desaturate on walking on 3 L  pulse oxygen but her heart rate went up from 86-1 37    Review of Systems neg for any significant sore throat, dysphagia, itching, sneezing, nasal congestion or excess/ purulent secretions, fever, chills, sweats, unintended wt loss, pleuritic or exertional cp, hempoptysis, orthopnea pnd or change in chronic leg swelling. Also denies presyncope, palpitations, heartburn, abdominal pain, nausea, vomiting, diarrhea or change in bowel or urinary habits, dysuria,hematuria, rash, arthralgias, visual complaints, headache, numbness weakness or ataxia.     Objective:   Physical Exam  Gen. Pleasant, elderly, thin, frail , on oxygen, in no distress ENT - no thrush, no pallor/icterus,no post nasal drip Neck: No JVD, no thyromegaly, no carotid bruits Lungs: no use of accessory muscles, no dullness to percussion, decreased bilateral without rales or rhonchi  Cardiovascular: Rhythm regular, heart sounds  normal, no murmurs or gallops, no peripheral edema Musculoskeletal: No deformities, no cyanosis or clubbing         Assessment & Plan:

## 2022-02-07 NOTE — Assessment & Plan Note (Signed)
Continue oxygen 24/7. POC is not ideal on exertion but seems to work while at rest and does improve quality of life and we have continued this.

## 2022-02-07 NOTE — Assessment & Plan Note (Signed)
Currently off chemo or immunotherapy.  48-month surveillance CT is planned by oncology

## 2022-02-13 DIAGNOSIS — J449 Chronic obstructive pulmonary disease, unspecified: Secondary | ICD-10-CM | POA: Diagnosis not present

## 2022-03-09 ENCOUNTER — Telehealth: Payer: Self-pay | Admitting: Physician Assistant

## 2022-03-09 NOTE — Telephone Encounter (Signed)
Rescheduled 09/21 appointments due to provider pal, called and left a voicemail.

## 2022-03-16 DIAGNOSIS — J449 Chronic obstructive pulmonary disease, unspecified: Secondary | ICD-10-CM | POA: Diagnosis not present

## 2022-03-17 DIAGNOSIS — R5383 Other fatigue: Secondary | ICD-10-CM | POA: Diagnosis not present

## 2022-03-17 DIAGNOSIS — D6481 Anemia due to antineoplastic chemotherapy: Secondary | ICD-10-CM | POA: Diagnosis not present

## 2022-03-17 DIAGNOSIS — R7303 Prediabetes: Secondary | ICD-10-CM | POA: Diagnosis not present

## 2022-03-17 DIAGNOSIS — E559 Vitamin D deficiency, unspecified: Secondary | ICD-10-CM | POA: Diagnosis not present

## 2022-03-17 DIAGNOSIS — E782 Mixed hyperlipidemia: Secondary | ICD-10-CM | POA: Diagnosis not present

## 2022-03-18 NOTE — Progress Notes (Unsigned)
Cardiology Office Note:    Date:  03/21/2022   ID:  Kayla Sawyer, DOB [birth year], MRN [id number]  PCP:  Celene Squibb, MD   [place] [facility] Cardiologist:  Werner Lean, MD     Referring MD: Celene Squibb, MD   CC: Chest Pain f/u  History of Present Illness:    Kayla Sawyer is a 76 y.o. female with a hx of COPD with secondary PH, active smoker on 4 L, NSCLC- adenocarcinoma on palliative radiotherapy and carboplatin and Keytruda therapy (stopped for non cardiac intolerance).  In 2017 had mild non obstructive CAD with no evidence of PAH, PAD with hx of Aortobifemoral bypass graft with aortic atherosclerosis.   2023: Had CP; negative stress test, Echo with MAC and elevated RVSP otherwise WNL  Patient notes that she is doing [place].   Since negative stress has had no chest pain. Has had no nitroglycerin. There are no interval hospital/ED visit.    Is able to go out to eat and go out to the store.  Her husband had a carotid endarectomy and cant push her around, she uses her assist.  No chest pain or pressure.  Breathing is stable.  DIL is a nurse an helps with some care. No weight gain or leg swelling.  No palpitations or syncope.  Past Medical History:  Diagnosis Date   Arthritis    COPD (chronic obstructive pulmonary disease) (HCC)    Depression    E. coli pyelonephritis 6//2014   GERD (gastroesophageal reflux disease)    Pneumonia    Pulmonary hypertension (HCC)    Sepsis ([place])     Past Surgical History:  Procedure Laterality Date   ABDOMINAL HYSTERECTOMY     partial   AORTA - BILATERAL FEMORAL ARTERY BYPASS GRAFT Bilateral 06/14/2016   Procedure: AORTOBIFEMORAL BYPASS GRAFT AORTA SMA BYPASS GRAFT RE-IMPLANTATION  OF SUPERIOR MESENTERIC ARTERY;  Surgeon: Serafina Mitchell, MD;  Location: [facility] OR;  Service: Vascular;  Laterality: Bilateral;   BACK SURGERY     L4/5   CARDIAC CATHETERIZATION N/A 01/03/2016   Procedure: Right/Left Heart Cath and Coronary Angiography;   Surgeon: Jolaine Artist, MD;  Location: [facility];  Service: Cardiovascular;  Laterality: N/A;   COLONOSCOPY N/A 08/05/2015   Procedure: COLONOSCOPY;  Surgeon: Rogene Houston, MD;  Location: [facility];  Service: Endoscopy;  Laterality: N/A;  730   ELBOW SURGERY     IR IMAGING GUIDED PORT INSERTION  02/16/2020   PERIPHERAL VASCULAR CATHETERIZATION N/A 02/16/2016   Procedure: Abdominal Aortogram w/Lower Extremity;  Surgeon: Wellington Hampshire, MD;  Location: [facility];  Service: Cardiovascular;  Laterality: N/A;    Current Medications: Current Meds  Medication Sig   acetaminophen (TYLENOL) 500 MG tablet Take 500 mg by mouth every 6 (six) hours as needed for mild pain.   albuterol (PROVENTIL) (2.5 MG/3ML) 0.083% nebulizer solution Take 3 mLs (2.5 mg total) by nebulization every 6 (six) hours as needed for wheezing or shortness of breath.   aspirin EC 81 MG tablet Take 1 tablet (81 mg total) by mouth daily.   atorvastatin (LIPITOR) 40 MG tablet Take 40 mg by mouth daily.   fluticasone (CUTIVATE) 0.05 % cream Apply topically.   Fluticasone-Umeclidin-Vilant (TRELEGY ELLIPTA) 100-62.5-25 MCG/ACT AEPB Inhale 1 puff into the lungs daily.   furosemide (LASIX) 20 MG tablet Take 1 tablet (20 mg total) by mouth daily as needed (leg swelling).   levocetirizine (XYZAL) 5 MG tablet  Take 5 mg by mouth every evening.    lidocaine-prilocaine (EMLA) cream Apply to the Port-A-Cath site 30-60-minute before chemotherapy every 3 weeks.   Multiple Vitamin (MULTIVITAMIN WITH MINERALS) TABS tablet Take 1 tablet by mouth daily.   mupirocin ointment (BACTROBAN) 2 % SMARTSIG:1 Application Topical 2-3 Times Daily   MYRBETRIQ 25 MG TB24 tablet Take 25 mg by mouth daily.   nitroGLYCERIN (NITROSTAT) 0.4 MG SL tablet Place 1 tablet (0.4 mg total) under the tongue every 5 (five) minutes as needed for chest pain.   omeprazole (PRILOSEC) 20 MG capsule Take 20 mg by mouth daily.    prochlorperazine  (COMPAZINE) 10 MG tablet Take 1 tablet (10 mg total) by mouth every 6 (six) hours as needed for nausea or vomiting.   sertraline (ZOLOFT) 100 MG tablet Take 100 mg by mouth daily.      Allergies:   Elemental sulfur   Social History   Socioeconomic History   Marital status: Married    Spouse name: Not on file   Number of children: Not on file   Years of education: Not on file   Highest education level: Not on file  Occupational History   Not on file  Tobacco Use   Smoking status: Every Day    Packs/day: 1.00    Years: 50.00    Total pack years: 50.00    Types: Cigarettes   Smokeless tobacco: Never   Tobacco comments:    smokes 1/2 pack per day- 08/31/2020  Vaping Use   Vaping Use: Never used  Substance and Sexual Activity   Alcohol use: No    Alcohol/week: 0.0 standard drinks of alcohol   Drug use: No   Sexual activity: Never  Other Topics Concern   Not on file  Social History Narrative   Not on file   Social Determinants of Health   Financial Resource Strain: Not on file  Food Insecurity: Not on file  Transportation Needs: Not on file  Physical Activity: Not on file  Stress: Not on file  Social Connections: Not on file    Social: lives with son, DIL and husband, daughter is a Marine scientist  Family History: The patient's family history includes Diabetes in her mother; Emphysema in her father.  ROS:   Please see the history of present illness.     All other systems reviewed and are negative.  EKGs/Labs/Other Studies Reviewed:    The following studies were reviewed today:  EKG:   08/18/21: SR 75 borderline anterior infarct   NM Myocar Multi W/Spect W/Wall Motion / EF 09/27/2021  Narrative   The study is normal. The study is low risk.   No ST deviation was noted.   LV perfusion is normal.   Left ventricular function is normal. End diastolic cavity size is normal.   ECHO COMPLETE WO IMAGING ENHANCING AGENT 10/07/2021  Narrative ECHOCARDIOGRAM  REPORT    Patient Name:   Kayla Sawyer Date of Exam: 10/07/2021 Medical Rec #:  [id number]    Height:       68.0 in Accession #:    [id number]   Weight:       138.0 lb Date of Birth:  Mar 02, 1946    BSA:          1.746 m Patient Age:    16 years     BP:           116/70 mmHg Patient Gender: F  HR:           89 bpm. Exam Location:  [facility]  Procedure: 2D Echo, Cardiac Doppler and Color Doppler  Indications:    C34.90 (ICD-10-CM) - Malignant neoplasm of lung, unspecified laterality, unspecified part of lung (HCC) R07.9 (ICD-10-CM) - Chest pain of uncertain etiology  History:        Patient has prior history of Echocardiogram examinations, most recent 06/16/2016. COPD; Risk Factors:Former Smoker. On home oxygen.  Sonographer:    Alvino Chapel RCS Referring Phys: [id number] [facility] Natina [place]  IMPRESSIONS   1. Left ventricular ejection fraction, by estimation, is 60 to 65%. The left ventricle has normal function. The left ventricle has no regional wall motion abnormalities. There is mild left ventricular hypertrophy. Left ventricular diastolic parameters are consistent with Grade I diastolic dysfunction (impaired relaxation). 2. Right ventricular systolic function is mildly reduced. The right ventricular size is normal. There is moderately elevated pulmonary artery systolic pressure. The estimated right ventricular systolic pressure is 08.1 mmHg. 3. A small pericardial effusion is present. 4. The mitral valve is degenerative. Trivial mitral valve regurgitation. No evidence of mitral stenosis. Moderate mitral annular calcification. 5. The aortic valve is tricuspid. Aortic valve regurgitation is not visualized. Aortic valve sclerosis is present, with no evidence of aortic valve stenosis. 6. The inferior vena cava is normal in size with greater than 50% respiratory variability, suggesting right atrial pressure of 3 mmHg.  FINDINGS Left Ventricle: Left ventricular ejection  fraction, by estimation, is 60 to 65%. The left ventricle has normal function. The left ventricle has no regional wall motion abnormalities. The left ventricular internal cavity size was normal in size. There is mild left ventricular hypertrophy. Left ventricular diastolic parameters are consistent with Grade I diastolic dysfunction (impaired relaxation).  Right Ventricle: The right ventricular size is normal. No increase in right ventricular wall thickness. Right ventricular systolic function is mildly reduced. There is moderately elevated pulmonary artery systolic pressure. The tricuspid regurgitant velocity is 3.45 m/s, and with an assumed right atrial pressure of 3 mmHg, the estimated right ventricular systolic pressure is 44.8 mmHg.  Left Atrium: Left atrial size was normal in size.  Right Atrium: Right atrial size was normal in size.  Pericardium: A small pericardial effusion is present.  Mitral Valve: The mitral valve is degenerative in appearance. Moderate mitral annular calcification. Trivial mitral valve regurgitation. No evidence of mitral valve stenosis.  Tricuspid Valve: The tricuspid valve is normal in structure. Tricuspid valve regurgitation is mild.  Aortic Valve: The aortic valve is tricuspid. Aortic valve regurgitation is not visualized. Aortic valve sclerosis is present, with no evidence of aortic valve stenosis.  Pulmonic Valve: The pulmonic valve was not well visualized. Pulmonic valve regurgitation is trivial.  Aorta: The aortic root is normal in size and structure.  Venous: The inferior vena cava is normal in size with greater than 50% respiratory variability, suggesting right atrial pressure of 3 mmHg.  IAS/Shunts: No atrial level shunt detected by color flow Doppler.   LEFT VENTRICLE PLAX 2D LVIDd:         3.70 cm   Diastology LVIDs:         2.60 cm   LV e' medial:    4.13 cm/s LV PW:         1.00 cm   LV E/e' medial:  12.8 LV IVS:        1.30 cm   LV e'  lateral:   8.05 cm/s LVOT diam:  1.90 cm   LV E/e' lateral: 6.5 LV SV:         47 LV SV Index:   27 LVOT Area:     2.84 cm   RIGHT VENTRICLE RV S prime:     7.29 cm/s TAPSE (M-mode): 1.2 cm  LEFT ATRIUM             Index        RIGHT ATRIUM           Index LA diam:        2.70 cm 1.55 cm/m   RA Area:     13.00 cm LA Vol (A2C):   54.1 ml 30.99 ml/m  RA Volume:   28.00 ml  16.04 ml/m LA Vol (A4C):   37.5 ml 21.48 ml/m LA Biplane Vol: 45.4 ml 26.01 ml/m AORTIC VALVE LVOT Vmax:   79.60 cm/s LVOT Vmean:  48.800 cm/s LVOT VTI:    0.166 m  AORTA Ao Root diam: 3.20 cm  MITRAL VALVE               TRICUSPID VALVE MV Area (PHT): 4.80 cm    TR Peak grad:   47.6 mmHg MV Decel Time: 158 msec    TR Vmax:        345.00 cm/s MV E velocity: 52.70 cm/s MV A velocity: 94.70 cm/s  SHUNTS MV E/A ratio:  0.56        Systemic VTI:  0.17 m Systemic Diam: 1.90 cm  Oswaldo Milian MD Electronically signed by Oswaldo Milian MD Signature Date/Time: 10/07/2021/7:10:12 PM    Final     Recent Labs: 12/05/2021: ALT 10; BUN 27; Creatinine 1.16; Hemoglobin 11.1; Platelet Count 186; Potassium 3.6; Sodium 142  Recent Lipid Panel No results found for: "CHOL", "TRIG", "HDL", "CHOLHDL", "VLDL", "LDLCALC", "LDLDIRECT"       Physical Exam:    VS:  BP 100/64   Pulse 96   Ht _0  (1.702 m)   Wt 135 lb (61.2 kg)   SpO2 97% Comment: on 4L O2 via [place]  BMI 21.14 kg/m     Wt Readings from Last 3 Encounters:  03/21/22 135 lb (61.2 kg)  02/07/22 135 lb 3.2 oz (61.3 kg)  12/07/21 136 lb 9 oz (61.9 kg)    Gen: no distress  Neck: No JVD  Cardiac: No Rubs or Gallops, no murmur, RRR +2 radial pulses; No RV heave Respiratory: Clear to auscultation bilaterally, normal effort, normal  respiratory rate GI: Soft, nontender, non-distended  MS: Non pitting edema; R > L;  moves all extremities Integument: Skin feels warm Neuro:  At time of evaluation, alert and oriented to  person/place/time/situation  Psych: Normal affect, patient feels well  ASSESSMENT:    1. Coronary artery disease involving native coronary artery of native heart without angina pectoris   2. PAD (peripheral artery disease) ([place])   3. Centrilobular emphysema ([place])   4. Adenocarcinoma of left lung, stage 4 ([place])   5. Antineoplastic chemotherapy induced anemia   6. Pulmonary hypertension, unspecified ([place])     PLAN:    CAD mild non-obstructive PAD hx of aortobifemoral bypass graft COPD with secondary PH on 4 L O2 but no active wheezing Group II Pulmonary HTN NSCLC after cardiotoxic chemo, immunotherapy, and high dose radiation (palliative chem that exceed life expectancy) - ASA 81 mg PO daily - continue atorvastatin 40 mg  - no BB given COPD and low BP - PRN lasix only did teaching due to her  limited mobility and low BP - continue PRN nitro (none needed last six months)  Six months me in [place]       Medication Adjustments/Labs and Tests Ordered: Current medicines are reviewed at length with the patient today.  Concerns regarding medicines are outlined above.  No orders of the defined types were placed in this encounter.  Meds ordered this encounter  Medications   furosemide (LASIX) 20 MG tablet    Sig: Take 1 tablet (20 mg total) by mouth daily as needed (leg swelling).    Dispense:  30 tablet    Refill:  3    03/21/2022 NEW    Patient Instructions  Medication Instructions:  Your physician has recommended you make the following change in your medication:  Start furosemide 20 mg daily as needed for leg swelling Continue other medications the same  Labwork: none  Testing/Procedures: none  Follow-Up: Your physician recommends that you schedule a follow-up appointment in: 6 months at the [facility] office  Any Other Special Instructions Will Be Listed Below (If Applicable).  If you need a refill on your cardiac medications before your next appointment, please call  your pharmacy.   Signed, Werner Lean, MD  03/21/2022 2:25 PM    [place]

## 2022-03-21 ENCOUNTER — Encounter: Payer: Self-pay | Admitting: Internal Medicine

## 2022-03-21 ENCOUNTER — Ambulatory Visit: Payer: Medicare Other | Attending: Internal Medicine | Admitting: Internal Medicine

## 2022-03-21 VITALS — BP 100/64 | HR 96 | Ht 67.0 in | Wt 135.0 lb

## 2022-03-21 DIAGNOSIS — D6481 Anemia due to antineoplastic chemotherapy: Secondary | ICD-10-CM | POA: Diagnosis not present

## 2022-03-21 DIAGNOSIS — C3492 Malignant neoplasm of unspecified part of left bronchus or lung: Secondary | ICD-10-CM | POA: Diagnosis not present

## 2022-03-21 DIAGNOSIS — I739 Peripheral vascular disease, unspecified: Secondary | ICD-10-CM | POA: Diagnosis not present

## 2022-03-21 DIAGNOSIS — J432 Centrilobular emphysema: Secondary | ICD-10-CM | POA: Diagnosis not present

## 2022-03-21 DIAGNOSIS — I2510 Atherosclerotic heart disease of native coronary artery without angina pectoris: Secondary | ICD-10-CM | POA: Diagnosis not present

## 2022-03-21 DIAGNOSIS — T451X5A Adverse effect of antineoplastic and immunosuppressive drugs, initial encounter: Secondary | ICD-10-CM

## 2022-03-21 DIAGNOSIS — I272 Pulmonary hypertension, unspecified: Secondary | ICD-10-CM | POA: Diagnosis not present

## 2022-03-21 MED ORDER — FUROSEMIDE 20 MG PO TABS: 20.0000 mg | ORAL_TABLET | Freq: Every day | ORAL | 3 refills | Status: DC | PRN

## 2022-03-21 NOTE — Patient Instructions (Addendum)
Medication Instructions:  Your physician has recommended you make the following change in your medication:  Start furosemide 20 mg daily as needed for leg swelling Continue other medications the same  Labwork: none  Testing/Procedures: none  Follow-Up: Your physician recommends that you schedule a follow-up appointment in: 6 months at the [facility] office  Any Other Special Instructions Will Be Listed Below (If Applicable).  If you need a refill on your cardiac medications before your next appointment, please call your pharmacy.

## 2022-03-23 DIAGNOSIS — J449 Chronic obstructive pulmonary disease, unspecified: Secondary | ICD-10-CM | POA: Diagnosis not present

## 2022-03-23 DIAGNOSIS — I739 Peripheral vascular disease, unspecified: Secondary | ICD-10-CM | POA: Diagnosis not present

## 2022-03-23 DIAGNOSIS — R7303 Prediabetes: Secondary | ICD-10-CM | POA: Diagnosis not present

## 2022-03-23 DIAGNOSIS — K219 Gastro-esophageal reflux disease without esophagitis: Secondary | ICD-10-CM | POA: Diagnosis not present

## 2022-03-23 DIAGNOSIS — F17219 Nicotine dependence, cigarettes, with unspecified nicotine-induced disorders: Secondary | ICD-10-CM | POA: Diagnosis not present

## 2022-03-23 DIAGNOSIS — J9611 Chronic respiratory failure with hypoxia: Secondary | ICD-10-CM | POA: Diagnosis not present

## 2022-03-23 DIAGNOSIS — E782 Mixed hyperlipidemia: Secondary | ICD-10-CM | POA: Diagnosis not present

## 2022-03-23 DIAGNOSIS — N1831 Chronic kidney disease, stage 3a: Secondary | ICD-10-CM | POA: Diagnosis not present

## 2022-03-23 DIAGNOSIS — Z23 Encounter for immunization: Secondary | ICD-10-CM | POA: Diagnosis not present

## 2022-03-23 DIAGNOSIS — C3492 Malignant neoplasm of unspecified part of left bronchus or lung: Secondary | ICD-10-CM | POA: Diagnosis not present

## 2022-03-23 DIAGNOSIS — D6481 Anemia due to antineoplastic chemotherapy: Secondary | ICD-10-CM | POA: Diagnosis not present

## 2022-03-29 ENCOUNTER — Telehealth: Payer: Self-pay | Admitting: Internal Medicine

## 2022-03-29 DIAGNOSIS — I2510 Atherosclerotic heart disease of native coronary artery without angina pectoris: Secondary | ICD-10-CM

## 2022-03-29 NOTE — Telephone Encounter (Signed)
Labs Reviewed:  Creatinine 1.42 on PRN lasix LDL 75 with history of CAD - Increase atorvastatin to 80 mg and labs in three months.  Rudean Haskell, MD [address] [place], [place] [address] [phone]  8:17 AM

## 2022-03-29 NOTE — Telephone Encounter (Signed)
Lm to call back ./cy 

## 2022-03-30 MED ORDER — ATORVASTATIN CALCIUM 80 MG PO TABS: 80.0000 mg | ORAL_TABLET | Freq: Every day | ORAL | 3 refills | Status: DC

## 2022-03-30 NOTE — Telephone Encounter (Signed)
Patient notified and verbalized understanding. Atorvastatin increased and sent to [facility] per pt's request.

## 2022-03-30 NOTE — Telephone Encounter (Signed)
Called patient with test results. No answer. Left message to call back.  

## 2022-03-30 NOTE — Addendum Note (Signed)
Addended by: Christella Scheuermann C on: 03/30/2022 03:16 PM   Modules accepted: Orders

## 2022-04-04 ENCOUNTER — Inpatient Hospital Stay: Payer: Medicare Other | Attending: Physician Assistant

## 2022-04-04 ENCOUNTER — Other Ambulatory Visit: Payer: Self-pay

## 2022-04-04 ENCOUNTER — Ambulatory Visit (HOSPITAL_COMMUNITY)
Admission: RE | Admit: 2022-04-04 | Discharge: 2022-04-04 | Disposition: A | Discharge status: Home / Self Care | Payer: Medicare Other | Source: Ambulatory Visit | Attending: Internal Medicine | Admitting: Internal Medicine

## 2022-04-04 DIAGNOSIS — C3490 Malignant neoplasm of unspecified part of unspecified bronchus or lung: Secondary | ICD-10-CM | POA: Insufficient documentation

## 2022-04-04 DIAGNOSIS — C3412 Malignant neoplasm of upper lobe, left bronchus or lung: Secondary | ICD-10-CM | POA: Insufficient documentation

## 2022-04-04 DIAGNOSIS — Z9981 Dependence on supplemental oxygen: Secondary | ICD-10-CM | POA: Insufficient documentation

## 2022-04-04 DIAGNOSIS — K5730 Diverticulosis of large intestine without perforation or abscess without bleeding: Secondary | ICD-10-CM | POA: Diagnosis not present

## 2022-04-04 DIAGNOSIS — J439 Emphysema, unspecified: Secondary | ICD-10-CM | POA: Diagnosis not present

## 2022-04-04 LAB — CMP (CANCER CENTER ONLY)
ALT: 10 U/L (ref 0–44)
AST: 15 U/L (ref 15–41)
Albumin: 4.1 g/dL (ref 3.5–5.0)
Alkaline Phosphatase: 52 U/L (ref 38–126)
Anion gap: 6 (ref 5–15)
BUN: 25 mg/dL — ABNORMAL HIGH (ref 8–23)
CO2: 30 mmol/L (ref 22–32)
Calcium: 9.2 mg/dL (ref 8.9–10.3)
Chloride: 104 mmol/L (ref 98–111)
Creatinine: 1.16 mg/dL — ABNORMAL HIGH (ref 0.44–1.00)
GFR, Estimated: 49 mL/min — ABNORMAL LOW (ref >60)
Glucose, Bld: 97 mg/dL (ref 70–99)
Potassium: 3.9 mmol/L (ref 3.5–5.1)
Sodium: 140 mmol/L (ref 135–145)
Total Bilirubin: 0.3 mg/dL (ref 0.3–1.2)
Total Protein: 6.6 g/dL (ref 6.5–8.1)

## 2022-04-04 LAB — CBC WITH DIFFERENTIAL (CANCER CENTER ONLY)
Abs Immature Granulocytes: 0.04 10*3/uL (ref 0.00–0.07)
Basophils Absolute: 0 10*3/uL (ref 0.0–0.1)
Basophils Relative: 1 %
Eosinophils Absolute: 0.1 10*3/uL (ref 0.0–0.5)
Eosinophils Relative: 2 %
HCT: 35.4 % — ABNORMAL LOW (ref 36.0–46.0)
Hemoglobin: 11.6 g/dL — ABNORMAL LOW (ref 12.0–15.0)
Immature Granulocytes: 1 %
Lymphocytes Relative: 26 %
Lymphs Abs: 1.5 10*3/uL (ref 0.7–4.0)
MCH: 30.2 pg (ref 26.0–34.0)
MCHC: 32.8 g/dL (ref 30.0–36.0)
MCV: 92.2 fL (ref 80.0–100.0)
Monocytes Absolute: 0.8 10*3/uL (ref 0.1–1.0)
Monocytes Relative: 13 %
Neutro Abs: 3.4 10*3/uL (ref 1.7–7.7)
Neutrophils Relative %: 57 %
Platelet Count: 234 10*3/uL (ref 150–400)
RBC: 3.84 MIL/uL — ABNORMAL LOW (ref 3.87–5.11)
RDW: 14.6 % (ref 11.5–15.5)
WBC Count: 5.9 10*3/uL (ref 4.0–10.5)
nRBC: 0 % (ref 0.0–0.2)

## 2022-04-04 MED ORDER — IOHEXOL 300 MG/ML  SOLN
80.0000 mL | Freq: Once | INTRAMUSCULAR | Status: AC | PRN
  Administered 2022-04-04: 80 mL via INTRAVENOUS

## 2022-04-06 ENCOUNTER — Ambulatory Visit: Payer: Medicare Other | Admitting: Internal Medicine

## 2022-04-06 ENCOUNTER — Inpatient Hospital Stay: Payer: Medicare Other | Admitting: Physician Assistant

## 2022-04-07 NOTE — Progress Notes (Unsigned)
[facility] OFFICE PROGRESS NOTE  Celene Squibb, MD [address] [place] [address]  DIAGNOSIS: Stage IV (T3, N2, M1a) non-small cell lung cancer, poorly differentiated adenocarcinoma presented with large left upper lobe lung mass in addition to mediastinal lymphadenopathy and bilateral pulmonary nodules diagnosed in June 2021.   Biomarker Findings Tumor Mutational Burden - 10 Muts/Mb Microsatellite status - MS-Stable Genomic Findings For a complete list of the genes assayed, please refer to the Appendix. NF1 Q2582* KRAS G12V CTNNB1 A671fs*10 TP53 H179N 7 Disease relevant genes with no reportable alterations: ALK, BRAF, EGFR, ERBB2, MET, RET, ROS1   PDL1 Expression: 100%  PRIOR THERAPY: 1) Palliative radiotherapy to the large left upper lobe lung mass. 2) Systemic chemotherapy with carboplatin for AUC of 5, Alimta 500 mg/M2 and Keytruda 200 mg IV every 3 weeks.  First dose 02/12/2020.  Status post 11 cycles.  Starting from cycle #5 she was on maintenance treatment with Alimta and Keytruda every 3 weeks.  Starting cycle #9 she will be on single agent Keytruda every 3 weeks.  Her treatment is currently on hold secondary to significant skin rash.  Last dose was giving December 29, 2020.    CURRENT THERAPY: Observation  INTERVAL HISTORY: Kayla Sawyer 76 y.o. female returns to the clinic today for follow-up visit accompanied by her husband.  Patient was last seen in clinic on 12/07/2021.  Since last being seen the patient denies any major changes in her health.  She continues to have shortness of breath at baseline for which she is on home oxygen.  She sees Dr. Elsworth Soho from pulmonology.  She denies any recent fever, chills, night sweats, or unexplained weight loss.  Denies any cough, chest pain, or hemoptysis.  Denies any nausea, vomiting, diarrhea, or constipation.  Denies any headache or visual changes.  She recently had a restaging CT scan performed.  She is here today for  evaluation and to review her scan results.    MEDICAL HISTORY: Past Medical History:  Diagnosis Date   Arthritis    COPD (chronic obstructive pulmonary disease) ([facility])    Depression    E. coli pyelonephritis 6//2014   GERD (gastroesophageal reflux disease)    Pneumonia    Pulmonary hypertension ([facility])    Sepsis ([facility])     ALLERGIES:  is allergic to elemental sulfur.  MEDICATIONS:  Current Outpatient Medications  Medication Sig Dispense Refill   acetaminophen (TYLENOL) 500 MG tablet Take 500 mg by mouth every 6 (six) hours as needed for mild pain.     albuterol (PROVENTIL) (2.5 MG/3ML) 0.083% nebulizer solution Take 3 mLs (2.5 mg total) by nebulization every 6 (six) hours as needed for wheezing or shortness of breath. 75 mL 12   aspirin EC 81 MG tablet Take 1 tablet (81 mg total) by mouth daily.     atorvastatin (LIPITOR) 80 MG tablet Take 1 tablet (80 mg total) by mouth daily. 90 tablet 3   fluticasone (CUTIVATE) 0.05 % cream Apply topically.     Fluticasone-Umeclidin-Vilant (TRELEGY ELLIPTA) 100-62.5-25 MCG/ACT AEPB Inhale 1 puff into the lungs daily. 60 each 11   folic acid (FOLVITE) 1 MG tablet Take 1 tablet (1 mg total) by mouth daily. (Patient not taking: Reported on 03/21/2022) 30 tablet 4   furosemide (LASIX) 20 MG tablet Take 1 tablet (20 mg total) by mouth daily as needed (leg swelling). 30 tablet 3   levocetirizine (XYZAL) 5 MG tablet Take 5 mg by mouth every evening.  lidocaine-prilocaine (EMLA) cream Apply to the Port-A-Cath site 30-60-minute before chemotherapy every 3 weeks. 30 g 0   Multiple Vitamin (MULTIVITAMIN WITH MINERALS) TABS tablet Take 1 tablet by mouth daily.     mupirocin ointment (BACTROBAN) 2 % SMARTSIG:1 Application Topical 2-3 Times Daily     MYRBETRIQ 25 MG TB24 tablet Take 25 mg by mouth daily.     nitroGLYCERIN (NITROSTAT) 0.4 MG SL tablet Place 1 tablet (0.4 mg total) under the tongue every 5 (five) minutes as needed for chest pain. 30 tablet 0    omeprazole (PRILOSEC) 20 MG capsule Take 20 mg by mouth daily.      prochlorperazine (COMPAZINE) 10 MG tablet Take 1 tablet (10 mg total) by mouth every 6 (six) hours as needed for nausea or vomiting. 30 tablet 0   sertraline (ZOLOFT) 100 MG tablet Take 100 mg by mouth daily.      No current facility-administered medications for this visit.   Facility-Administered Medications Ordered in Other Visits  Medication Dose Route Frequency Provider Last Rate Last Admin   sodium chloride flush (NS) 0.9 % injection 10 mL  10 mL Intravenous PRN Curt Bears, MD        SURGICAL HISTORY:  Past Surgical History:  Procedure Laterality Date   ABDOMINAL HYSTERECTOMY     partial   AORTA - BILATERAL FEMORAL ARTERY BYPASS GRAFT Bilateral 06/14/2016   Procedure: AORTOBIFEMORAL BYPASS GRAFT AORTA SMA BYPASS GRAFT RE-IMPLANTATION  OF SUPERIOR MESENTERIC ARTERY;  Surgeon: Serafina Mitchell, MD;  Location: [facility];  Service: Vascular;  Laterality: Bilateral;   BACK SURGERY     L4/5   CARDIAC CATHETERIZATION N/A 01/03/2016   Procedure: Right/Left Heart Cath and Coronary Angiography;  Surgeon: Jolaine Artist, MD;  Location: [facility];  Service: Cardiovascular;  Laterality: N/A;   COLONOSCOPY N/A 08/05/2015   Procedure: COLONOSCOPY;  Surgeon: Rogene Houston, MD;  [facility]: [facility];  Service: Endoscopy;  Laterality: N/A;  730   ELBOW SURGERY     IR IMAGING GUIDED PORT INSERTION  02/16/2020   PERIPHERAL VASCULAR CATHETERIZATION N/A 02/16/2016   Procedure: Abdominal Aortogram w/Lower Extremity;  Surgeon: Wellington Hampshire, MD;  Location: [facility];  Service: Cardiovascular;  Laterality: N/A;    REVIEW OF SYSTEMS:   Review of Systems  Constitutional: Negative for appetite change, chills, fatigue, fever and unexpected weight change.  HENT:   Negative for mouth sores, nosebleeds, sore throat and trouble swallowing.   Eyes: Negative for eye problems and icterus.  Respiratory: Negative for  cough, hemoptysis, shortness of breath and wheezing.   Cardiovascular: Negative for chest pain and leg swelling.  Gastrointestinal: Negative for abdominal pain, constipation, diarrhea, nausea and vomiting.  Genitourinary: Negative for bladder incontinence, difficulty urinating, dysuria, frequency and hematuria.   Musculoskeletal: Negative for back pain, gait problem, neck pain and neck stiffness.  Skin: Negative for itching and rash.  Neurological: Negative for dizziness, extremity weakness, gait problem, headaches, light-headedness and seizures.  Hematological: Negative for adenopathy. Does not bruise/bleed easily.  Psychiatric/Behavioral: Negative for confusion, depression and sleep disturbance. The patient is not nervous/anxious.     PHYSICAL EXAMINATION:  There were no vitals taken for this visit.  ECOG PERFORMANCE STATUS: {CHL ONC ECOG [id number]  Physical Exam  Constitutional: Oriented to person, place, and time and well-developed, well-nourished, and in no distress. No distress.  HENT:  Head: Normocephalic and atraumatic.  Mouth/Throat: Oropharynx is clear and moist. No oropharyngeal exudate.  Eyes: Conjunctivae are normal.  Right eye exhibits no discharge. Left eye exhibits no discharge. No scleral icterus.  Neck: Normal range of motion. Neck supple.  Cardiovascular: Normal rate, regular rhythm, normal heart sounds and intact distal pulses.   Pulmonary/Chest: Effort normal and breath sounds normal. No respiratory distress. No wheezes. No rales.  Abdominal: Soft. Bowel sounds are normal. Exhibits no distension and no mass. There is no tenderness.  Musculoskeletal: Normal range of motion. Exhibits no edema.  Lymphadenopathy:    No cervical adenopathy.  Neurological: Alert and oriented to person, place, and time. Exhibits normal muscle tone. Gait normal. Coordination normal.  Skin: Skin is warm and dry. No rash noted. Not diaphoretic. No erythema. No pallor.  Psychiatric: Mood,  memory and judgment normal.  Vitals reviewed.  LABORATORY DATA: Lab Results  Component Value Date   WBC 5.9 04/04/2022   HGB 11.6 (L) 04/04/2022   HCT 35.4 (L) 04/04/2022   MCV 92.2 04/04/2022   PLT 234 04/04/2022      Chemistry      Component Value Date/Time   NA 140 04/04/2022 1004   K 3.9 04/04/2022 1004   CL 104 04/04/2022 1004   CO2 30 04/04/2022 1004   BUN 25 (H) 04/04/2022 1004   CREATININE 1.16 (H) 04/04/2022 1004   CREATININE 0.90 02/08/2016 0949      Component Value Date/Time   CALCIUM 9.2 04/04/2022 1004   ALKPHOS 52 04/04/2022 1004   AST 15 04/04/2022 1004   ALT 10 04/04/2022 1004   BILITOT 0.3 04/04/2022 1004       RADIOGRAPHIC STUDIES:  CT Chest W Contrast  Result Date: 04/05/2022 CLINICAL DATA:  Non-small-cell lung cancer. Restaging. * Tracking Code: BO * EXAM: CT CHEST, ABDOMEN, AND PELVIS WITH CONTRAST TECHNIQUE: Multidetector CT imaging of the chest, abdomen and pelvis was performed following the standard protocol during bolus administration of intravenous contrast. RADIATION DOSE REDUCTION: This exam was performed according to the departmental dose-optimization program which includes automated exposure control, adjustment of the mA and/or kV according to patient size and/or use of iterative reconstruction technique. CONTRAST:  11m OMNIPAQUE IOHEXOL 300 MG/ML  SOLN COMPARISON:  12/05/2021 FINDINGS: CT CHEST FINDINGS Cardiovascular: The heart size is normal. Small pericardial effusion is similar to prior. Coronary artery calcification is evident. Insert calcium thoracic moderate right Port-A-Cath tip is positioned in the distal SVC. Mediastinum/Nodes: No mediastinal lymphadenopathy. There is no hilar lymphadenopathy. The esophagus has normal imaging features. There is no axillary lymphadenopathy. Lungs/Pleura: Centrilobular and paraseptal emphysema evident. Volume loss with architectural distortion and scarring in left upper lobe is similar to prior compatible  with treatment related changes. No new suspicious pulmonary nodule or mass. No focal airspace consolidation. No pleural effusion. Musculoskeletal: No worrisome lytic or sclerotic osseous abnormality. CT ABDOMEN PELVIS FINDINGS Hepatobiliary: No suspicious focal abnormality within the liver parenchyma. Gallbladder is nondistended. No intrahepatic or extrahepatic biliary dilation. Pancreas: No focal mass lesion. No dilatation of the main duct. No intraparenchymal cyst. No peripancreatic edema. Spleen: No splenomegaly. No focal mass lesion. Adrenals/Urinary Tract: Stable thickening of both adrenal glands. Cortical scarring noted in both kidneys. No evidence for hydroureter. The urinary bladder appears normal for the degree of distention. Stomach/Bowel: Stomach is unremarkable. No gastric wall thickening. No evidence of outlet obstruction. Duodenum is normally positioned as is the ligament of Treitz. Duodenal diverticulum noted. No small bowel wall thickening. No small bowel dilatation. The terminal ileum is normal. The appendix is normal. No gross colonic mass. No colonic wall thickening. Diverticular changes are noted  in the left colon without evidence of diverticulitis. Vascular/Lymphatic: Status post aorto bi femoral bypass grafting. There is no gastrohepatic or hepatoduodenal ligament lymphadenopathy. No retroperitoneal or mesenteric lymphadenopathy. No pelvic sidewall lymphadenopathy. Reproductive: The uterus is surgically absent. There is no adnexal mass. Other: No intraperitoneal free fluid. Musculoskeletal: No worrisome lytic or sclerotic osseous abnormality. IMPRESSION: 1. Stable exam. No new or progressive findings to suggest recurrent or metastatic disease in the chest, abdomen, or pelvis. 2. Stable appearance of treatment related changes in the left upper lobe. 3. Left colonic diverticulosis without diverticulitis. 4. Aortic Atherosclerosis (ICD10-I70.0) and Emphysema (ICD10-J43.9). Electronically Signed    By: Misty Stanley M.D.   On: 04/05/2022 11:14   CT Abdomen Pelvis W Contrast  Result Date: 04/05/2022 CLINICAL DATA:  Non-small-cell lung cancer. Restaging. * Tracking Code: BO * EXAM: CT CHEST, ABDOMEN, AND PELVIS WITH CONTRAST TECHNIQUE: Multidetector CT imaging of the chest, abdomen and pelvis was performed following the standard protocol during bolus administration of intravenous contrast. RADIATION DOSE REDUCTION: This exam was performed according to the departmental dose-optimization program which includes automated exposure control, adjustment of the mA and/or kV according to patient size and/or use of iterative reconstruction technique. CONTRAST:  77m OMNIPAQUE IOHEXOL 300 MG/ML  SOLN COMPARISON:  12/05/2021 FINDINGS: CT CHEST FINDINGS Cardiovascular: The heart size is normal. Small pericardial effusion is similar to prior. Coronary artery calcification is evident. Insert calcium thoracic moderate right Port-A-Cath tip is positioned in the distal SVC. Mediastinum/Nodes: No mediastinal lymphadenopathy. There is no hilar lymphadenopathy. The esophagus has normal imaging features. There is no axillary lymphadenopathy. Lungs/Pleura: Centrilobular and paraseptal emphysema evident. Volume loss with architectural distortion and scarring in left upper lobe is similar to prior compatible with treatment related changes. No new suspicious pulmonary nodule or mass. No focal airspace consolidation. No pleural effusion. Musculoskeletal: No worrisome lytic or sclerotic osseous abnormality. CT ABDOMEN PELVIS FINDINGS Hepatobiliary: No suspicious focal abnormality within the liver parenchyma. Gallbladder is nondistended. No intrahepatic or extrahepatic biliary dilation. Pancreas: No focal mass lesion. No dilatation of the main duct. No intraparenchymal cyst. No peripancreatic edema. Spleen: No splenomegaly. No focal mass lesion. Adrenals/Urinary Tract: Stable thickening of both adrenal glands. Cortical scarring noted in  both kidneys. No evidence for hydroureter. The urinary bladder appears normal for the degree of distention. Stomach/Bowel: Stomach is unremarkable. No gastric wall thickening. No evidence of outlet obstruction. Duodenum is normally positioned as is the ligament of Treitz. Duodenal diverticulum noted. No small bowel wall thickening. No small bowel dilatation. The terminal ileum is normal. The appendix is normal. No gross colonic mass. No colonic wall thickening. Diverticular changes are noted in the left colon without evidence of diverticulitis. Vascular/Lymphatic: Status post aorto bi femoral bypass grafting. There is no gastrohepatic or hepatoduodenal ligament lymphadenopathy. No retroperitoneal or mesenteric lymphadenopathy. No pelvic sidewall lymphadenopathy. Reproductive: The uterus is surgically absent. There is no adnexal mass. Other: No intraperitoneal free fluid. Musculoskeletal: No worrisome lytic or sclerotic osseous abnormality. IMPRESSION: 1. Stable exam. No new or progressive findings to suggest recurrent or metastatic disease in the chest, abdomen, or pelvis. 2. Stable appearance of treatment related changes in the left upper lobe. 3. Left colonic diverticulosis without diverticulitis. 4. Aortic Atherosclerosis (ICD10-I70.0) and Emphysema (ICD10-J43.9). Electronically Signed   By: EMisty StanleyM.D.   On: 04/05/2022 11:14     ASSESSMENT/PLAN:  This is a very pleasant 76year old Caucasian female with stage IV (T3, N2, M1 a) non-small cell lung cancer, poorly differentiated adenocarcinoma.  She was diagnosed  in June 2021.  The patient presented with a large left upper lobe lung mass with mediastinal invasion in addition to mediastinal lymphadenopathy and bilateral pulmonary nodules.  The patient had no actionable mutations but her PD-L1 expression is 100%.  She underwent systemic treatment carboplatin for an AUC of 5, Alimta 500 mg per metered squared, Keytruda 200 mg IV every 3 weeks.  She was  status post 11 cycles.  Starting from cycle #5 she was on maintenance treatment with Alimta and Keytruda every 3 weeks.  Starting from cycle 9 she was on treatment single agent Keytruda.  She took a break from treatment for a few months after cycle #9 but had evidence of disease progression and resumed her treatment.  She had some recurrent skin rash and itching with treatment and decided not to proceed with any more immunotherapy.  She has been on observation now for ***without any concerning issues.  The patient recently had a restaging CT scan of the chest, abdomen, and pelvis.  Dr. Julien Nordmann personally independently reviewed the scan and discussed the results with the patient today.  The scan showed no evidence of disease progression.  Dr. Julien Nordmann recommends that she continue on observation with repeat scan in 4 months.  She will continue to follow with pulmonary medicine for her COPD and shortness of breath.  The patient was advised to call immediately if she has any concerning symptoms in the interval. The patient voices understanding of current disease status and treatment options and is in agreement with the current care plan. All questions were answered. The patient knows to call the clinic with any problems, questions or concerns. We can certainly see the patient much sooner if necessary      No orders of the defined types were placed in this encounter.    I spent {CHL ONC TIME VISIT - RWPTY:[id number]} counseling the patient face to face. The total time spent in the appointment was {CHL ONC TIME VISIT - DTPNS:[id number]}.  Kayla Brian L Anicia Leuthold, PA-C 04/07/22

## 2022-04-10 ENCOUNTER — Inpatient Hospital Stay: Payer: Medicare Other | Admitting: Physician Assistant

## 2022-04-10 ENCOUNTER — Inpatient Hospital Stay: Payer: Medicare Other

## 2022-04-10 ENCOUNTER — Other Ambulatory Visit: Payer: Self-pay

## 2022-04-10 VITALS — BP 121/66 | HR 88 | Temp 97.9°F | Resp 16 | Wt 136.7 lb

## 2022-04-10 DIAGNOSIS — Z95828 Presence of other vascular implants and grafts: Secondary | ICD-10-CM

## 2022-04-10 DIAGNOSIS — C3492 Malignant neoplasm of unspecified part of left bronchus or lung: Secondary | ICD-10-CM

## 2022-04-10 DIAGNOSIS — Z9981 Dependence on supplemental oxygen: Secondary | ICD-10-CM | POA: Diagnosis not present

## 2022-04-10 DIAGNOSIS — C3412 Malignant neoplasm of upper lobe, left bronchus or lung: Secondary | ICD-10-CM | POA: Diagnosis not present

## 2022-04-10 MED ORDER — SODIUM CHLORIDE 0.9% FLUSH
10.0000 mL | Freq: Once | INTRAVENOUS | Status: AC
  Administered 2022-04-10: 10 mL

## 2022-04-10 MED ORDER — HEPARIN SOD (PORK) LOCK FLUSH 100 UNIT/ML IV SOLN
500.0000 [IU] | Freq: Once | INTRAVENOUS | Status: AC
  Administered 2022-04-10: 500 [IU]

## 2022-04-15 DIAGNOSIS — J449 Chronic obstructive pulmonary disease, unspecified: Secondary | ICD-10-CM | POA: Diagnosis not present

## 2022-05-16 DIAGNOSIS — J449 Chronic obstructive pulmonary disease, unspecified: Secondary | ICD-10-CM | POA: Diagnosis not present

## 2022-05-18 ENCOUNTER — Telehealth: Payer: Self-pay | Admitting: Internal Medicine

## 2022-05-18 NOTE — Telephone Encounter (Signed)
Called patient regarding November and January appointments. Patient was notified.

## 2022-05-26 ENCOUNTER — Ambulatory Visit: Payer: Medicare Other | Admitting: Podiatry

## 2022-05-26 ENCOUNTER — Encounter: Payer: Self-pay | Admitting: Podiatry

## 2022-05-26 DIAGNOSIS — M79674 Pain in right toe(s): Secondary | ICD-10-CM | POA: Diagnosis not present

## 2022-05-26 DIAGNOSIS — M79675 Pain in left toe(s): Secondary | ICD-10-CM

## 2022-05-26 DIAGNOSIS — B351 Tinea unguium: Secondary | ICD-10-CM

## 2022-05-26 DIAGNOSIS — I999 Unspecified disorder of circulatory system: Secondary | ICD-10-CM

## 2022-05-26 NOTE — Progress Notes (Signed)

## 2022-06-02 DIAGNOSIS — J069 Acute upper respiratory infection, unspecified: Secondary | ICD-10-CM | POA: Diagnosis not present

## 2022-06-02 DIAGNOSIS — R059 Cough, unspecified: Secondary | ICD-10-CM | POA: Diagnosis not present

## 2022-06-12 ENCOUNTER — Inpatient Hospital Stay: Payer: Medicare Other | Attending: Physician Assistant

## 2022-06-12 ENCOUNTER — Other Ambulatory Visit: Payer: Self-pay

## 2022-06-12 DIAGNOSIS — C3412 Malignant neoplasm of upper lobe, left bronchus or lung: Secondary | ICD-10-CM | POA: Diagnosis not present

## 2022-06-12 DIAGNOSIS — Z452 Encounter for adjustment and management of vascular access device: Secondary | ICD-10-CM | POA: Insufficient documentation

## 2022-06-12 DIAGNOSIS — Z95828 Presence of other vascular implants and grafts: Secondary | ICD-10-CM

## 2022-06-12 MED ORDER — SODIUM CHLORIDE 0.9% FLUSH
10.0000 mL | Freq: Once | INTRAVENOUS | Status: AC
  Administered 2022-06-12: 10 mL

## 2022-06-12 MED ORDER — HEPARIN SOD (PORK) LOCK FLUSH 100 UNIT/ML IV SOLN
500.0000 [IU] | Freq: Once | INTRAVENOUS | Status: AC
  Administered 2022-06-12: 500 [IU]

## 2022-06-15 DIAGNOSIS — J449 Chronic obstructive pulmonary disease, unspecified: Secondary | ICD-10-CM | POA: Diagnosis not present

## 2022-06-26 ENCOUNTER — Other Ambulatory Visit: Payer: Self-pay

## 2022-06-26 ENCOUNTER — Encounter: Payer: Self-pay | Admitting: Pulmonary Disease

## 2022-06-26 ENCOUNTER — Ambulatory Visit: Payer: Medicare Other | Admitting: Pulmonary Disease

## 2022-06-26 VITALS — BP 118/74 | HR 48 | Temp 98.4°F | Ht 68.0 in | Wt 135.0 lb

## 2022-06-26 DIAGNOSIS — C3492 Malignant neoplasm of unspecified part of left bronchus or lung: Secondary | ICD-10-CM | POA: Diagnosis not present

## 2022-06-26 DIAGNOSIS — J432 Centrilobular emphysema: Secondary | ICD-10-CM | POA: Diagnosis not present

## 2022-06-26 DIAGNOSIS — F17200 Nicotine dependence, unspecified, uncomplicated: Secondary | ICD-10-CM | POA: Diagnosis not present

## 2022-06-26 DIAGNOSIS — J9611 Chronic respiratory failure with hypoxia: Secondary | ICD-10-CM | POA: Diagnosis not present

## 2022-06-26 MED ORDER — TRELEGY ELLIPTA 100-62.5-25 MCG/ACT IN AEPB: 1.0000 | INHALATION_SPRAY | Freq: Every day | RESPIRATORY_TRACT | 11 refills | Status: DC

## 2022-06-26 NOTE — Assessment & Plan Note (Signed)
68-month follow-up CT chest is planned for January

## 2022-06-26 NOTE — Patient Instructions (Signed)
  Refills on trelegy

## 2022-06-26 NOTE — Assessment & Plan Note (Signed)
Continue oxygen 24/7

## 2022-06-26 NOTE — Assessment & Plan Note (Signed)
Continue Trelegy. Use albuterol for rescue. We discussed signs and symptoms of COPD exacerbation and action plan should this happen

## 2022-06-26 NOTE — Progress Notes (Signed)
   Subjective:    Patient ID: Kayla Sawyer, female    DOB: 03/05/1946, 76 y.o.   MRN: [id number]  HPI  76 yo smoker for follow-up of COPD, lung cancer and chronic hypoxic respiratory failure Presented 11/2019 left upper lobe lung mass, hypermetabolic with scattered bilateral nodules and hilar and mediastinal lymphadenopathy >>   Stage IV (T3, N2, M1a) non-small cell lung cancer, poorly differentiated adenocarcinoma , PDL1 Expression: 100%   S/p palliative radiotherapy Systemic chemotherapy with carboplatin , Alimta and Keytruda  every 3 weeks first dose 01/2020  disease progression after stopping Keytruda  (skin rash ) -off Rx since 12/2020, under observation  Chief Complaint  Patient presents with   Follow-up    Pt states she is feeling better since LOV.   Accompanied by her husband who corroborates history, arrives in a wheelchair She and her husband both had a chest cold , tested negative for COVID and received an antibiotic from PCP.  She is improved and back to baseline. She is compliant with oxygen 24/7. Reviewed CT chest 03/2022 which does not show any evidence of recurrent disease.   Significant tests/ events reviewed   07/2021 she needs continuous oxygen and POC even at 4 L is suboptimal on exertion although this seems to work at rest when she is in the wheelchair.  This does increase her ability to go outside so we will have her continue   CT chest/abd/pelvis 11/2021 No evidence of recurrent or metastatic disease    PFTs 11/2015 showed ratio 52 with FEV1 53%, FVC 77%, paradoxical drop with bronhodilator, TLC 99% & DLCO 29%   HRCT 07/2016 RUL nodule regressed, no ILD, moderate centrilobular and paraseptal emphysema   11/2019 she did not desaturate on walking on 3 L pulse oxygen but her heart rate went up from 86-1 37  Review of Systems neg for any significant sore throat, dysphagia, itching, sneezing, nasal congestion or excess/ purulent secretions, fever, chills, sweats,  unintended wt loss, pleuritic or exertional cp, hempoptysis, orthopnea pnd or change in chronic leg swelling. Also denies presyncope, palpitations, heartburn, abdominal pain, nausea, vomiting, diarrhea or change in bowel or urinary habits, dysuria,hematuria, rash, arthralgias, visual complaints, headache, numbness weakness or ataxia.     Objective:   Physical Exam  Gen. Pleasant, thin, elderly,on oxygen, in no distress ENT - no thrush, no pallor/icterus,no post nasal drip Neck: No JVD, no thyromegaly, no carotid bruits Lungs: no use of accessory muscles, no dullness to percussion, clear without rales or rhonchi  Cardiovascular: Rhythm regular, heart sounds  normal, no murmurs or gallops, no peripheral edema Musculoskeletal: No deformities, no cyanosis or clubbing        Assessment & Plan:

## 2022-06-26 NOTE — Assessment & Plan Note (Signed)
Smoking cessation again emphasized

## 2022-07-16 DIAGNOSIS — J449 Chronic obstructive pulmonary disease, unspecified: Secondary | ICD-10-CM | POA: Diagnosis not present

## 2022-07-24 DIAGNOSIS — M25512 Pain in left shoulder: Secondary | ICD-10-CM | POA: Diagnosis not present

## 2022-07-31 ENCOUNTER — Inpatient Hospital Stay: Payer: Medicare Other | Attending: Physician Assistant

## 2022-07-31 ENCOUNTER — Encounter (HOSPITAL_COMMUNITY): Payer: Self-pay

## 2022-07-31 ENCOUNTER — Ambulatory Visit (HOSPITAL_COMMUNITY)
Admission: RE | Admit: 2022-07-31 | Discharge: 2022-07-31 | Disposition: A | Discharge status: Home / Self Care | Payer: Medicare Other | Source: Ambulatory Visit | Attending: Physician Assistant | Admitting: Physician Assistant

## 2022-07-31 ENCOUNTER — Other Ambulatory Visit: Payer: Self-pay

## 2022-07-31 DIAGNOSIS — C3492 Malignant neoplasm of unspecified part of left bronchus or lung: Secondary | ICD-10-CM

## 2022-07-31 DIAGNOSIS — Z85118 Personal history of other malignant neoplasm of bronchus and lung: Secondary | ICD-10-CM | POA: Insufficient documentation

## 2022-07-31 DIAGNOSIS — J432 Centrilobular emphysema: Secondary | ICD-10-CM | POA: Diagnosis not present

## 2022-07-31 DIAGNOSIS — Z08 Encounter for follow-up examination after completed treatment for malignant neoplasm: Secondary | ICD-10-CM | POA: Insufficient documentation

## 2022-07-31 DIAGNOSIS — Z95828 Presence of other vascular implants and grafts: Secondary | ICD-10-CM

## 2022-07-31 DIAGNOSIS — I3139 Other pericardial effusion (noninflammatory): Secondary | ICD-10-CM | POA: Insufficient documentation

## 2022-07-31 DIAGNOSIS — C3490 Malignant neoplasm of unspecified part of unspecified bronchus or lung: Secondary | ICD-10-CM | POA: Diagnosis not present

## 2022-07-31 DIAGNOSIS — C7800 Secondary malignant neoplasm of unspecified lung: Secondary | ICD-10-CM | POA: Diagnosis not present

## 2022-07-31 LAB — CMP (CANCER CENTER ONLY)
ALT: 11 U/L (ref 0–44)
AST: 15 U/L (ref 15–41)
Albumin: 4 g/dL (ref 3.5–5.0)
Alkaline Phosphatase: 47 U/L (ref 38–126)
Anion gap: 6 (ref 5–15)
BUN: 36 mg/dL — ABNORMAL HIGH (ref 8–23)
CO2: 29 mmol/L (ref 22–32)
Calcium: 9 mg/dL (ref 8.9–10.3)
Chloride: 108 mmol/L (ref 98–111)
Creatinine: 1.05 mg/dL — ABNORMAL HIGH (ref 0.44–1.00)
GFR, Estimated: 55 mL/min — ABNORMAL LOW (ref >60)
Glucose, Bld: 83 mg/dL (ref 70–99)
Potassium: 3.6 mmol/L (ref 3.5–5.1)
Sodium: 143 mmol/L (ref 135–145)
Total Bilirubin: 0.4 mg/dL (ref 0.3–1.2)
Total Protein: 6.5 g/dL (ref 6.5–8.1)

## 2022-07-31 LAB — CBC WITH DIFFERENTIAL (CANCER CENTER ONLY)
Abs Immature Granulocytes: 0.04 10*3/uL (ref 0.00–0.07)
Basophils Absolute: 0 10*3/uL (ref 0.0–0.1)
Basophils Relative: 1 %
Eosinophils Absolute: 0 10*3/uL (ref 0.0–0.5)
Eosinophils Relative: 1 %
HCT: 35 % — ABNORMAL LOW (ref 36.0–46.0)
Hemoglobin: 11.5 g/dL — ABNORMAL LOW (ref 12.0–15.0)
Immature Granulocytes: 1 %
Lymphocytes Relative: 17 %
Lymphs Abs: 1.3 10*3/uL (ref 0.7–4.0)
MCH: 30.3 pg (ref 26.0–34.0)
MCHC: 32.9 g/dL (ref 30.0–36.0)
MCV: 92.1 fL (ref 80.0–100.0)
Monocytes Absolute: 0.9 10*3/uL (ref 0.1–1.0)
Monocytes Relative: 12 %
Neutro Abs: 5.4 10*3/uL (ref 1.7–7.7)
Neutrophils Relative %: 68 %
Platelet Count: 220 10*3/uL (ref 150–400)
RBC: 3.8 MIL/uL — ABNORMAL LOW (ref 3.87–5.11)
RDW: 15.2 % (ref 11.5–15.5)
WBC Count: 7.7 10*3/uL (ref 4.0–10.5)
nRBC: 0 % (ref 0.0–0.2)

## 2022-07-31 MED ORDER — HEPARIN SOD (PORK) LOCK FLUSH 100 UNIT/ML IV SOLN
INTRAVENOUS | Status: AC
  Administered 2022-07-31: 500 [IU] via INTRAVENOUS
  Filled 2022-07-31: qty 5

## 2022-07-31 MED ORDER — SODIUM CHLORIDE (PF) 0.9 % IJ SOLN
INTRAMUSCULAR | Status: AC
  Filled 2022-07-31: qty 50

## 2022-07-31 MED ORDER — IOHEXOL 300 MG/ML  SOLN
100.0000 mL | Freq: Once | INTRAMUSCULAR | Status: AC | PRN
  Administered 2022-07-31: 100 mL via INTRAVENOUS

## 2022-07-31 MED ORDER — SODIUM CHLORIDE 0.9% FLUSH
10.0000 mL | Freq: Once | INTRAVENOUS | Status: AC
  Administered 2022-07-31: 10 mL

## 2022-07-31 MED ORDER — HEPARIN SOD (PORK) LOCK FLUSH 100 UNIT/ML IV SOLN: 500.0000 [IU] | Freq: Once | INTRAVENOUS | Status: AC

## 2022-08-02 ENCOUNTER — Other Ambulatory Visit: Payer: Self-pay

## 2022-08-02 ENCOUNTER — Inpatient Hospital Stay: Payer: Medicare Other | Admitting: Internal Medicine

## 2022-08-02 VITALS — BP 115/61 | HR 92 | Resp 17 | Wt 132.6 lb

## 2022-08-02 DIAGNOSIS — C3492 Malignant neoplasm of unspecified part of left bronchus or lung: Secondary | ICD-10-CM | POA: Diagnosis not present

## 2022-08-02 DIAGNOSIS — C3490 Malignant neoplasm of unspecified part of unspecified bronchus or lung: Secondary | ICD-10-CM | POA: Diagnosis not present

## 2022-08-02 DIAGNOSIS — Z85118 Personal history of other malignant neoplasm of bronchus and lung: Secondary | ICD-10-CM | POA: Diagnosis not present

## 2022-08-02 DIAGNOSIS — Z08 Encounter for follow-up examination after completed treatment for malignant neoplasm: Secondary | ICD-10-CM | POA: Diagnosis not present

## 2022-08-02 DIAGNOSIS — I3139 Other pericardial effusion (noninflammatory): Secondary | ICD-10-CM | POA: Diagnosis not present

## 2022-08-02 NOTE — Progress Notes (Signed)
[facility] [phone]   [phone]  OFFICE PROGRESS NOTE  [name], MD [name] [place] [address]  DIAGNOSIS: Stage IV (T3, N2, M1a) non-small cell lung cancer, poorly differentiated adenocarcinoma presented with large left upper lobe lung mass in addition to mediastinal lymphadenopathy and bilateral pulmonary nodules diagnosed in June 2021.  Biomarker Findings Tumor Mutational Burden - 10 Muts/Mb Microsatellite status - MS-Stable Genomic Findings For a complete list of the genes assayed, please refer to the Appendix. NF1 Q2582* KRAS G12V CTNNB1 A639fs*10 TP53 H179N 7 Disease relevant genes with no reportable alterations: ALK, BRAF, EGFR, ERBB2, MET, RET, ROS1  PDL1 Expression: 100%  PRIOR THERAPY:  1) Palliative radiotherapy to the large left upper lobe lung mass. 2) Systemic chemotherapy with carboplatin for AUC of 5, Alimta 500 mg/M2 and Keytruda 200 mg IV every 3 weeks.  First dose 02/12/2020.  Status post 11 cycles.  Starting from cycle #5 she was on maintenance treatment with Alimta and Keytruda every 3 weeks.  Starting cycle #9 she will be on single agent Keytruda every 3 weeks.  Her treatment is currently on hold secondary to significant skin rash.  Last dose was giving December 29, 2020.  CURRENT THERAPY: Observation.  INTERVAL HISTORY: Kayla Sawyer 77 y.o. female returns to the clinic today for 54-month follow-up visit accompanied by her husband.  The patient is feeling fine today with no concerning complaints except for the baseline shortness of breath.  She is currently on home oxygen.  She denied having any current chest pain, cough or hemoptysis.  She has no nausea, vomiting, diarrhea or constipation.  She has no headache or visual changes.  She denied having any recent weight loss or night sweats.  She is here today for evaluation with repeat CT scan of the chest for restaging of her disease.   MEDICAL  HISTORY: Past Medical History:  Diagnosis Date   Arthritis    COPD (chronic obstructive pulmonary disease) (HCC)    Depression    E. coli pyelonephritis 6//2014   GERD (gastroesophageal reflux disease)    Pneumonia    Pulmonary hypertension (HCC)    Sepsis (HCC)     ALLERGIES:  is allergic to elemental sulfur.  MEDICATIONS:  Current Outpatient Medications  Medication Sig Dispense Refill   acetaminophen (TYLENOL) 500 MG tablet Take 500 mg by mouth every 6 (six) hours as needed for mild pain.     albuterol (PROVENTIL) (2.5 MG/3ML) 0.083% nebulizer solution Take 3 mLs (2.5 mg total) by nebulization every 6 (six) hours as needed for wheezing or shortness of breath. 75 mL 12   aspirin EC 81 MG tablet Take 1 tablet (81 mg total) by mouth daily.     atorvastatin (LIPITOR) 80 MG tablet Take 1 tablet (80 mg total) by mouth daily. 90 tablet 3   fluticasone (CUTIVATE) 0.05 % cream Apply topically.     Fluticasone-Umeclidin-Vilant (TRELEGY ELLIPTA) 100-62.5-25 MCG/ACT AEPB Inhale 1 puff into the lungs daily. 60 each 11   furosemide (LASIX) 20 MG tablet Take 1 tablet (20 mg total) by mouth daily as needed (leg swelling). 30 tablet 3   levocetirizine (XYZAL) 5 MG tablet Take 5 mg by mouth every evening.      lidocaine-prilocaine (EMLA) cream Apply to the Port-A-Cath site 30-60-minute before chemotherapy every 3 weeks. 30 g 0   Multiple Vitamin (MULTIVITAMIN WITH MINERALS) TABS tablet Take 1 tablet by mouth daily.  mupirocin ointment (BACTROBAN) 2 % SMARTSIG:1 Application Topical 2-3 Times Daily     MYRBETRIQ 25 MG TB24 tablet Take 25 mg by mouth daily.     nitroGLYCERIN (NITROSTAT) 0.4 MG SL tablet Place 1 tablet (0.4 mg total) under the tongue every 5 (five) minutes as needed for chest pain. 30 tablet 0   omeprazole (PRILOSEC) 20 MG capsule Take 20 mg by mouth daily.      prochlorperazine (COMPAZINE) 10 MG tablet Take 1 tablet (10 mg total) by mouth every 6 (six) hours as needed for nausea or  vomiting. 30 tablet 0   sertraline (ZOLOFT) 100 MG tablet Take 100 mg by mouth daily.      No current facility-administered medications for this visit.   Facility-Administered Medications Ordered in Other Visits  Medication Dose Route Frequency Provider Last Rate Last Admin   sodium chloride flush (NS) 0.9 % injection 10 mL  10 mL Intravenous PRN Si Gaul, MD        SURGICAL HISTORY:  Past Surgical History:  Procedure Laterality Date   ABDOMINAL HYSTERECTOMY     partial   AORTA - BILATERAL FEMORAL ARTERY BYPASS GRAFT Bilateral 06/14/2016   Procedure: AORTOBIFEMORAL BYPASS GRAFT AORTA SMA BYPASS GRAFT RE-IMPLANTATION  OF SUPERIOR MESENTERIC ARTERY;  Surgeon: Nada Libman, MD;  Location: [facility] OR;  Service: Vascular;  Laterality: Bilateral;   BACK SURGERY     L4/5   CARDIAC CATHETERIZATION N/A 01/03/2016   Procedure: Right/Left Heart Cath and Coronary Angiography;  Surgeon: Dolores Patty, MD;  Location: [facility] INVASIVE CV LAB;  Service: Cardiovascular;  Laterality: N/A;   COLONOSCOPY N/A 08/05/2015   Procedure: COLONOSCOPY;  Surgeon: Malissa Hippo, MD;  Location: [facility] ENDO SUITE;  Service: Endoscopy;  Laterality: N/A;  730   ELBOW SURGERY     IR IMAGING GUIDED PORT INSERTION  02/16/2020   PERIPHERAL VASCULAR CATHETERIZATION N/A 02/16/2016   Procedure: Abdominal Aortogram w/Lower Extremity;  Surgeon: Iran Ouch, MD;  Location: [facility] INVASIVE CV LAB;  Service: Cardiovascular;  Laterality: N/A;    REVIEW OF SYSTEMS:  Constitutional: positive for fatigue Eyes: negative Ears, nose, mouth, throat, and face: negative Respiratory: positive for dyspnea on exertion Cardiovascular: negative Gastrointestinal: negative Genitourinary:negative Integument/breast: negative Hematologic/lymphatic: negative Musculoskeletal:positive for muscle weakness Neurological: negative Behavioral/Psych: negative Endocrine: negative Allergic/Immunologic: negative   PHYSICAL EXAMINATION: General  appearance: alert, cooperative, fatigued, and no distress Head: Normocephalic, without obvious abnormality, atraumatic Neck: no adenopathy, no JVD, supple, symmetrical, trachea midline, and thyroid not enlarged, symmetric, no tenderness/mass/nodules Lymph nodes: Cervical, supraclavicular, and axillary nodes normal. Resp: clear to auscultation bilaterally Back: symmetric, no curvature. ROM normal. No CVA tenderness. Cardio: regular rate and rhythm, S1, S2 normal, no murmur, click, rub or gallop GI: soft, non-tender; bowel sounds normal; no masses,  no organomegaly Extremities: extremities normal, atraumatic, no cyanosis or edema Neurologic: Alert and oriented X 3, normal strength and tone. Normal symmetric reflexes. Normal coordination and gait    ECOG PERFORMANCE STATUS: 1 - Symptomatic but completely ambulatory  Blood pressure 115/61, pulse 92, resp. rate 17, weight 132 lb 9 oz (60.1 kg), SpO2 98 %.  LABORATORY DATA: Lab Results  Component Value Date   WBC 7.7 07/31/2022   HGB 11.5 (L) 07/31/2022   HCT 35.0 (L) 07/31/2022   MCV 92.1 07/31/2022   PLT 220 07/31/2022      Chemistry      Component Value Date/Time   NA 143 07/31/2022 1200   K 3.6 07/31/2022 1200   CL  108 07/31/2022 1200   CO2 29 07/31/2022 1200   BUN 36 (H) 07/31/2022 1200   CREATININE 1.05 (H) 07/31/2022 1200   CREATININE 0.90 02/08/2016 0949      Component Value Date/Time   CALCIUM 9.0 07/31/2022 1200   ALKPHOS 47 07/31/2022 1200   AST 15 07/31/2022 1200   ALT 11 07/31/2022 1200   BILITOT 0.4 07/31/2022 1200       RADIOGRAPHIC STUDIES: CT Chest W Contrast  Result Date: 07/31/2022 CLINICAL DATA:  Non-small cell lung cancer, metastatic, assess treatment response. Chemotherapy and radiation therapy complete. Shortness of breath. * Tracking Code: BO * EXAM: CT CHEST, ABDOMEN, AND PELVIS WITH CONTRAST TECHNIQUE: Multidetector CT imaging of the chest, abdomen and pelvis was performed following the standard  protocol during bolus administration of intravenous contrast. RADIATION DOSE REDUCTION: This exam was performed according to the departmental dose-optimization program which includes automated exposure control, adjustment of the mA and/or kV according to patient size and/or use of iterative reconstruction technique. CONTRAST:  OMNIPAQUE IOHEXOL 300 MG/ML  SOLN COMPARISON:  04/04/2022. FINDINGS: CT CHEST FINDINGS Cardiovascular: Right IJ Port-A-Cath tip is at the SVC RA junction. Atherosclerotic calcification of the aorta, aortic valve and coronary arteries. Heart is at the upper limits of normal in size. Moderate to large pericardial effusion, increased. Enlarged pulmonic trunk. Mediastinum/Nodes: No pathologically enlarged mediastinal, hilar or axillary lymph nodes. Esophagus is air-filled, suggesting dysmotility. Lungs/Pleura: Centrilobular emphysema. Post treatment scarring and volume loss in the left upper lobe, unchanged. No pleural fluid. Debris is seen in the airway. Musculoskeletal: Degenerative changes in the spine. No worrisome lytic or sclerotic lesions. CT ABDOMEN PELVIS FINDINGS Hepatobiliary: Liver and gallbladder are unremarkable. No biliary ductal dilatation. Pancreas: Negative. Spleen: Negative. Adrenals/Urinary Tract: Adrenal glands are unremarkable. Renal cortical scarring and parenchymal atrophy bilaterally. Ureters are decompressed. Bladder is grossly unremarkable. Stomach/Bowel: Stomach, small bowel, appendix and colon are unremarkable. Incidental note is made of stool throughout the colon, indicative of constipation. Vascular/Lymphatic: Aortobifemoral bypass graft. Heavily calcified superior mesenteric artery origin. No pathologically enlarged lymph nodes. Reproductive: Hysterectomy.  No adnexal mass. Other: No free fluid.  Mesenteries and peritoneum are unremarkable. Musculoskeletal: Degenerative changes in the spine. No worrisome lytic or sclerotic lesions. IMPRESSION: 1. Post  treatment scarring and volume loss in the left upper lobe. No evidence of recurrent or metastatic disease. 2. Moderate to large pericardial effusion, increased from 04/04/2022. 3. Aortic atherosclerosis (ICD10-I70.0). Coronary artery calcification. Densely calcified superior mesenteric artery origin. 4. Enlarged pulmonic trunk, indicative of pulmonary arterial hypertension. 5.  Emphysema (ICD10-J43.9). Electronically Signed   By: Leanna Battles M.D.   On: 07/31/2022 15:05   CT Abdomen Pelvis W Contrast  Result Date: 07/31/2022 CLINICAL DATA:  Non-small cell lung cancer, metastatic, assess treatment response. Chemotherapy and radiation therapy complete. Shortness of breath. * Tracking Code: BO * EXAM: CT CHEST, ABDOMEN, AND PELVIS WITH CONTRAST TECHNIQUE: Multidetector CT imaging of the chest, abdomen and pelvis was performed following the standard protocol during bolus administration of intravenous contrast. RADIATION DOSE REDUCTION: This exam was performed according to the departmental dose-optimization program which includes automated exposure control, adjustment of the mA and/or kV according to patient size and/or use of iterative reconstruction technique. CONTRAST:  OMNIPAQUE IOHEXOL 300 MG/ML  SOLN COMPARISON:  04/04/2022. FINDINGS: CT CHEST FINDINGS Cardiovascular: Right IJ Port-A-Cath tip is at the SVC RA junction. Atherosclerotic calcification of the aorta, aortic valve and coronary arteries. Heart is at the upper limits of normal in size. Moderate to large  pericardial effusion, increased. Enlarged pulmonic trunk. Mediastinum/Nodes: No pathologically enlarged mediastinal, hilar or axillary lymph nodes. Esophagus is air-filled, suggesting dysmotility. Lungs/Pleura: Centrilobular emphysema. Post treatment scarring and volume loss in the left upper lobe, unchanged. No pleural fluid. Debris is seen in the airway. Musculoskeletal: Degenerative changes in the spine. No worrisome lytic or sclerotic  lesions. CT ABDOMEN PELVIS FINDINGS Hepatobiliary: Liver and gallbladder are unremarkable. No biliary ductal dilatation. Pancreas: Negative. Spleen: Negative. Adrenals/Urinary Tract: Adrenal glands are unremarkable. Renal cortical scarring and parenchymal atrophy bilaterally. Ureters are decompressed. Bladder is grossly unremarkable. Stomach/Bowel: Stomach, small bowel, appendix and colon are unremarkable. Incidental note is made of stool throughout the colon, indicative of constipation. Vascular/Lymphatic: Aortobifemoral bypass graft. Heavily calcified superior mesenteric artery origin. No pathologically enlarged lymph nodes. Reproductive: Hysterectomy.  No adnexal mass. Other: No free fluid.  Mesenteries and peritoneum are unremarkable. Musculoskeletal: Degenerative changes in the spine. No worrisome lytic or sclerotic lesions. IMPRESSION: 1. Post treatment scarring and volume loss in the left upper lobe. No evidence of recurrent or metastatic disease. 2. Moderate to large pericardial effusion, increased from 04/04/2022. 3. Aortic atherosclerosis (ICD10-I70.0). Coronary artery calcification. Densely calcified superior mesenteric artery origin. 4. Enlarged pulmonic trunk, indicative of pulmonary arterial hypertension. 5.  Emphysema (ICD10-J43.9). Electronically Signed   By: Leanna Battles M.D.   On: 07/31/2022 15:05     ASSESSMENT AND PLAN: This is a very pleasant 77 years old white female with a stage IV (T3, N2, M1a) non-small cell lung cancer, poorly differentiated adenocarcinoma diagnosed in June 2021 and presented with large left upper lobe lung mass with mediastinal invasion in addition to mediastinal lymphadenopathy as well as bilateral pulmonary nodules. The patient has no actionable mutations but PD-L1 expression is 100%. Because of the bulky disease I recommended for the patient to consider the combination chemotherapy.  She is interested in this option. She is currently being treated with  carboplatin for AUC of 5, Alimta 500 mg/M2 and Keytruda 200 mg IV every 3 weeks status post 11 cycles.  Starting from cycle #5 she is on maintenance treatment with Alimta and Keytruda every 3 weeks.  Starting from cycle #9 she is on treatment with single agent Keytruda.  The patient had a break off treatment for few months after cycle #9 but she had some evidence for disease progression and she resumed her treatment. The patient tolerated the last cycle of her treatment well except for the recurrence of skin rash and itching again.  She decided not to proceed with any further treatment with immunotherapy. The patient is currently on observation for the last 16 months and she is feeling fine except for the baseline shortness of breath secondary to COPD. She had repeat CT scan of the chest performed recently.  I personally and independently reviewed the scan images and discussed the result with the patient and her husband. Her scan showed no evidence for recurrent or metastatic disease but she had moderate to large pericardial effusion that increased from September 2023. I recommended for the patient to continue on observation with repeat CT scan of the chest in 3 months. For the pericardial effusion, I will refer the patient to her cardiologist for consideration of 2D echo and pericardiocentesis if needed. For the history of COPD, she will continue her care and follow-up by pulmonary medicine. She was advised to call immediately if she has any concerning symptoms in the interval. The patient voices understanding of current disease status and treatment options and is in agreement with  the current care plan. All questions were answered. The patient knows to call the clinic with any problems, questions or concerns. We can certainly see the patient much sooner if necessary.  Disclaimer: This note was dictated with voice recognition software. Similar sounding words can inadvertently be transcribed and may not  be corrected upon review.

## 2022-08-14 NOTE — Progress Notes (Unsigned)
Cardiology Office Note:    Date:  08/15/2022   ID:  Kayla, Sawyer 20-Feb-1946, MRN [id number]  PCP:  Celene Squibb, MD   [facility] Cardiologist:  Werner Lean, MD     Referring MD: Curt Bears, MD   CC: Chest Pain f/u  History of Present Illness:    Kayla Sawyer is a 77 y.o. female with a hx of COPD with secondary PH, active smoker on 4 L, NSCLC- adenocarcinoma on palliative radiotherapy and carboplatin and Keytruda therapy (stopped for non cardiac intolerance).  In 2017 had mild non obstructive CAD with no evidence of PAH, PAD with hx of Aortobifemoral bypass graft with aortic atherosclerosis.   2023: Had CP; negative stress test, Echo with MAC and elevated RVSP otherwise WNL  Increased statin.  Patient notes that she is doing well.   Thinks her cancer treatment is doing great per patient. There are no interval hospital/ED visit.    No chest pain or pressure .  No SOB/DOE and no PND/Orthopnea.  No weight gain or leg swelling.  No palpitations or syncope .  She had a follow up CT. She had a increase in her pericardial effusion. She has had new elevated heart rate that she believes is related to getting into the building. No syncope near syncope.    Past Medical History:  Diagnosis Date   Arthritis    COPD (chronic obstructive pulmonary disease) (HCC)    Depression    E. coli pyelonephritis 6//2014   GERD (gastroesophageal reflux disease)    Pneumonia    Pulmonary hypertension (HCC)    Sepsis ([place])     Past Surgical History:  Procedure Laterality Date   ABDOMINAL HYSTERECTOMY     partial   AORTA - BILATERAL FEMORAL ARTERY BYPASS GRAFT Bilateral 06/14/2016   Procedure: AORTOBIFEMORAL BYPASS GRAFT AORTA SMA BYPASS GRAFT RE-IMPLANTATION  OF SUPERIOR MESENTERIC ARTERY;  Surgeon: Serafina Mitchell, MD;  Location: [facility];  Service: Vascular;  Laterality: Bilateral;   BACK SURGERY     L4/5   CARDIAC CATHETERIZATION N/A 01/03/2016   Procedure:  Right/Left Heart Cath and Coronary Angiography;  Surgeon: Jolaine Artist, MD;  Location: [facility];  Service: Cardiovascular;  Laterality: N/A;   COLONOSCOPY N/A 08/05/2015   Procedure: COLONOSCOPY;  Surgeon: Rogene Houston, MD;  Location: [facility];  Service: Endoscopy;  Laterality: N/A;  730   ELBOW SURGERY     IR IMAGING GUIDED PORT INSERTION  02/16/2020   PERIPHERAL VASCULAR CATHETERIZATION N/A 02/16/2016   Procedure: Abdominal Aortogram w/Lower Extremity;  Surgeon: Wellington Hampshire, MD;  Location: [facility];  Service: Cardiovascular;  Laterality: N/A;    Current Medications: Current Meds  Medication Sig   acetaminophen (TYLENOL) 500 MG tablet Take 500 mg by mouth every 6 (six) hours as needed for mild pain.   albuterol (PROVENTIL) (2.5 MG/3ML) 0.083% nebulizer solution Take 3 mLs (2.5 mg total) by nebulization every 6 (six) hours as needed for wheezing or shortness of breath.   aspirin EC 81 MG tablet Take 1 tablet (81 mg total) by mouth daily.   atorvastatin (LIPITOR) 80 MG tablet Take 1 tablet (80 mg total) by mouth daily.   fluticasone (CUTIVATE) 0.05 % cream Apply topically.   Fluticasone-Umeclidin-Vilant (TRELEGY ELLIPTA) 100-62.5-25 MCG/ACT AEPB Inhale 1 puff into the lungs daily.   furosemide (LASIX) 20 MG tablet Take 1 tablet (20 mg total) by mouth daily as needed (leg swelling).  levocetirizine (XYZAL) 5 MG tablet Take 5 mg by mouth every evening.    lidocaine-prilocaine (EMLA) cream Apply to the Port-A-Cath site 30-60-minute before chemotherapy every 3 weeks.   Multiple Vitamin (MULTIVITAMIN WITH MINERALS) TABS tablet Take 1 tablet by mouth daily.   mupirocin ointment (BACTROBAN) 2 % SMARTSIG:1 Application Topical 2-3 Times Daily   MYRBETRIQ 25 MG TB24 tablet Take 25 mg by mouth daily.   nitroGLYCERIN (NITROSTAT) 0.4 MG SL tablet Place 1 tablet (0.4 mg total) under the tongue every 5 (five) minutes as needed for chest pain.   omeprazole (PRILOSEC) 20 MG  capsule Take 20 mg by mouth daily.    OXYGEN Inhale 4 L into the lungs continuous.   prochlorperazine (COMPAZINE) 10 MG tablet Take 1 tablet (10 mg total) by mouth every 6 (six) hours as needed for nausea or vomiting.   sertraline (ZOLOFT) 100 MG tablet Take 100 mg by mouth daily.      Allergies:   Elemental sulfur   Social History   Socioeconomic History   Marital status: Married    Spouse name: Not on file   Number of children: Not on file   Years of education: Not on file   Highest education level: Not on file  Occupational History   Not on file  Tobacco Use   Smoking status: Every Day    Packs/day: 1.00    Years: 50.00    Total pack years: 50.00    Types: Cigarettes   Smokeless tobacco: Never   Tobacco comments:    smokes 1/2 pack per day- 08/31/2020  Vaping Use   Vaping Use: Never used  Substance and Sexual Activity   Alcohol use: No    Alcohol/week: 0.0 standard drinks of alcohol   Drug use: No   Sexual activity: Never  Other Topics Concern   Not on file  Social History Narrative   Not on file   Social Determinants of Health   Financial Resource Strain: Not on file  Food Insecurity: Not on file  Transportation Needs: Not on file  Physical Activity: Not on file  Stress: Not on file  Social Connections: Not on file    Social: lives with son, DIL and husband, daughter is a Marine scientist  Family History: The patient's family history includes Diabetes in her mother; Emphysema in her father.  ROS:   Please see the history of present illness.     All other systems reviewed and are negative.  EKGs/Labs/Other Studies Reviewed:    The following studies were reviewed today:  EKG:   08/18/21: SR 75 borderline anterior infarct   Cardiac Studies & Procedures   CARDIAC CATHETERIZATION  CARDIAC CATHETERIZATION 01/03/2016  Narrative  Prox RCA to Mid RCA lesion, 30% stenosed.  Mid RCA to Dist RCA lesion, 20% stenosed.  Mid LAD lesion, 40% stenosed.  2nd Diag  lesion, 50% stenosed.  Prox LAD to Mid LAD lesion, 20% stenosed.  Findings:  RA = 1 RV = 38/0/4 PA = 40/11 (26) PCW = 7 Fick cardiac output/index = 3.5/2.0 PVR = 5.0 WU Ao sat = 90% PA sat = 58%, 58%  Assessment: 1. Mild non-obstructive CAD with separate ostia for LAD and LCX 2. Minimally elevated R-sided pressures 3. Normal LV function 4. Moderately reduced cardiac output  Plan/Discussion:  Suspect dyspnea mostly due to lung disease. No significant PAH.  Bensimhon, Daniel,MD 12:14 PM  Findings Coronary Findings Diagnostic  Dominance: Right  Left Anterior Descending  Second Diagonal Branch  Left Circumflex .  Vessel is angiographically normal.  Right Coronary Artery  Intervention  No interventions have been documented.   STRESS TESTS  NM MYOCAR MULTI W/SPECT W 09/27/2021  Narrative   The study is normal. The study is low risk.   No ST deviation was noted.   LV perfusion is normal.   Left ventricular function is normal. End diastolic cavity size is normal.   ECHOCARDIOGRAM  ECHOCARDIOGRAM COMPLETE 10/07/2021  Narrative ECHOCARDIOGRAM REPORT    Patient Name:   ABYGAYLE DELTORO Date of Exam: 10/07/2021 Medical Rec #:  [id number]    Height:       68.0 in Accession #:    [id number]   Weight:       138.0 lb Date of Birth:  09-17-1945    BSA:          1.746 m Patient Age:    77 years     BP:           116/70 mmHg Patient Gender: F            HR:           89 bpm. Exam Location:  [facility]  Procedure: 2D Echo, Cardiac Doppler and Color Doppler  Indications:    C34.90 (ICD-10-CM) - Malignant neoplasm of lung, unspecified laterality, unspecified part of lung (HCC) R07.9 (ICD-10-CM) - Chest pain of uncertain etiology  History:        Patient has prior history of Echocardiogram examinations, most recent 06/16/2016. COPD; Risk Factors:Former Smoker. On home oxygen.  Sonographer:    Alvino Chapel RCS Referring Phys: [id number] [facility]  Viney Acocella  IMPRESSIONS   1. Left ventricular ejection fraction, by estimation, is 60 to 65%. The left ventricle has normal function. The left ventricle has no regional wall motion abnormalities. There is mild left ventricular hypertrophy. Left ventricular diastolic parameters are consistent with Grade I diastolic dysfunction (impaired relaxation). 2. Right ventricular systolic function is mildly reduced. The right ventricular size is normal. There is moderately elevated pulmonary artery systolic pressure. The estimated right ventricular systolic pressure is 08.1 mmHg. 3. A small pericardial effusion is present. 4. The mitral valve is degenerative. Trivial mitral valve regurgitation. No evidence of mitral stenosis. Moderate mitral annular calcification. 5. The aortic valve is tricuspid. Aortic valve regurgitation is not visualized. Aortic valve sclerosis is present, with no evidence of aortic valve stenosis. 6. The inferior vena cava is normal in size with greater than 50% respiratory variability, suggesting right atrial pressure of 3 mmHg.  FINDINGS Left Ventricle: Left ventricular ejection fraction, by estimation, is 60 to 65%. The left ventricle has normal function. The left ventricle has no regional wall motion abnormalities. The left ventricular internal cavity size was normal in size. There is mild left ventricular hypertrophy. Left ventricular diastolic parameters are consistent with Grade I diastolic dysfunction (impaired relaxation).  Right Ventricle: The right ventricular size is normal. No increase in right ventricular wall thickness. Right ventricular systolic function is mildly reduced. There is moderately elevated pulmonary artery systolic pressure. The tricuspid regurgitant velocity is 3.45 m/s, and with an assumed right atrial pressure of 3 mmHg, the estimated right ventricular systolic pressure is 44.8 mmHg.  Left Atrium: Left atrial size was normal in size.  Right Atrium:  Right atrial size was normal in size.  Pericardium: A small pericardial effusion is present.  Mitral Valve: The mitral valve is degenerative in appearance. Moderate mitral annular calcification. Trivial mitral valve regurgitation. No evidence of mitral valve stenosis.  Tricuspid  Valve: The tricuspid valve is normal in structure. Tricuspid valve regurgitation is mild.  Aortic Valve: The aortic valve is tricuspid. Aortic valve regurgitation is not visualized. Aortic valve sclerosis is present, with no evidence of aortic valve stenosis.  Pulmonic Valve: The pulmonic valve was not well visualized. Pulmonic valve regurgitation is trivial.  Aorta: The aortic root is normal in size and structure.  Venous: The inferior vena cava is normal in size with greater than 50% respiratory variability, suggesting right atrial pressure of 3 mmHg.  IAS/Shunts: No atrial level shunt detected by color flow Doppler.   LEFT VENTRICLE PLAX 2D LVIDd:         3.70 cm   Diastology LVIDs:         2.60 cm   LV e' medial:    4.13 cm/s LV PW:         1.00 cm   LV E/e' medial:  12.8 LV IVS:        1.30 cm   LV e' lateral:   8.05 cm/s LVOT diam:     1.90 cm   LV E/e' lateral: 6.5 LV SV:         47 LV SV Index:   27 LVOT Area:     2.84 cm   RIGHT VENTRICLE RV S prime:     7.29 cm/s TAPSE (M-mode): 1.2 cm  LEFT ATRIUM             Index        RIGHT ATRIUM           Index LA diam:        2.70 cm 1.55 cm/m   RA Area:     13.00 cm LA Vol (A2C):   54.1 ml 30.99 ml/m  RA Volume:   28.00 ml  16.04 ml/m LA Vol (A4C):   37.5 ml 21.48 ml/m LA Biplane Vol: 45.4 ml 26.01 ml/m AORTIC VALVE LVOT Vmax:   79.60 cm/s LVOT Vmean:  48.800 cm/s LVOT VTI:    0.166 m  AORTA Ao Root diam: 3.20 cm  MITRAL VALVE               TRICUSPID VALVE MV Area (PHT): 4.80 cm    TR Peak grad:   47.6 mmHg MV Decel Time: 158 msec    TR Vmax:        345.00 cm/s MV E velocity: 52.70 cm/s MV A velocity: 94.70 cm/s  SHUNTS MV E/A  ratio:  0.56        Systemic VTI:  0.17 m Systemic Diam: 1.90 cm  Oswaldo Milian MD Electronically signed by Oswaldo Milian MD Signature Date/Time: 10/07/2021/7:10:12 PM    Final               Recent Labs: 07/31/2022: ALT 11; BUN 36; Creatinine 1.05; Hemoglobin 11.5; Platelet Count 220; Potassium 3.6; Sodium 143  Recent Lipid Panel No results found for: "CHOL", "TRIG", "HDL", "CHOLHDL", "VLDL", "LDLCALC", "LDLDIRECT"       Physical Exam:    VS:  BP (!) 92/58 (BP Location: Left Arm, Patient Position: Sitting, Cuff Size: Normal)   Pulse (!) 130   Ht 5' 7"  (1.702 m)   Wt 132 lb 3.2 oz (60 kg)   SpO2 93%   BMI 20.71 kg/m     Wt Readings from Last 3 Encounters:  08/15/22 132 lb 3.2 oz (60 kg)  08/02/22 132 lb 9 oz (60.1 kg)  06/26/22 135 lb (61.2 kg)    Gen:  no distress  Neck: No JVD  Cardiac: No Rubs or Gallops, no murmur, regular tachycardia+2 radial pulses; No RV heave Respiratory: Clear to auscultation bilaterally, normal effort, normal  respiratory rate GI: Soft, nontender, non-distended  MS: no edema  moves all extremities Integument: Skin feels warm Neuro:  At time of evaluation, alert and oriented to person/place/time/situation  Psych: Normal affect, patient feels well  ASSESSMENT:    No diagnosis found.  PLAN:    NSCLC after cardiotoxic chemo, immunotherapy, and high dose radiation  Moderate circumferential pericardial effusion with hypotension and tachycardia (without JVD) - discussed going to ED for stat Echo: this would be to rule out cardiac tamponade - family not amenable at this time; she feels fine - we have expedited an echo for tomorrow morning - discussed with Dr. Julien Nordmann; getting cytology would affect treatment plan - consented for pericardiocentesis - if evidence of tampondate or increased pericardial pressure, pericardiocentesis tomorrow - if not, schedule as this week   CAD mild non-obstructive PAD hx of aortobifemoral  bypass graft COPD with secondary PH on 4 L O2 but no active wheezing Group II/III Pulmonary HTN - ASA 81 mg PO daily - continue atorvastatin 80 mg  - no BB given COPD and low BP        Medication Adjustments/Labs and Tests Ordered: Current medicines are reviewed at length with the patient today.  Concerns regarding medicines are outlined above.  No orders of the defined types were placed in this encounter.  No orders of the defined types were placed in this encounter.   There are no Patient Instructions on file for this visit.   Signed, Werner Lean, MD  08/15/2022 3:30 PM    [place]

## 2022-08-14 NOTE — H&P (View-Only) (Signed)
Cardiology Office Note:    Date:  08/15/2022   ID:  Kayla Sawyer, Kayla Sawyer 20-Feb-1946, MRN [id number]  PCP:  Celene Squibb, MD   [place] [facility] Cardiologist:  Werner Lean, MD     Referring MD: Curt Bears, MD   CC: Chest Pain f/u  History of Present Illness:    Kayla Sawyer is a 77 y.o. female with a hx of COPD with secondary PH, active smoker on 4 L, NSCLC- adenocarcinoma on palliative radiotherapy and carboplatin and Keytruda therapy (stopped for non cardiac intolerance).  In 2017 had mild non obstructive CAD with no evidence of PAH, PAD with hx of Aortobifemoral bypass graft with aortic atherosclerosis.   2023: Had CP; negative stress test, Echo with MAC and elevated RVSP otherwise WNL  Increased statin.  Patient notes that she is doing well.   Thinks her cancer treatment is doing great per patient. There are no interval hospital/ED visit.    No chest pain or pressure .  No SOB/DOE and no PND/Orthopnea.  No weight gain or leg swelling.  No palpitations or syncope .  She had a follow up CT. She had a increase in her pericardial effusion. She has had new elevated heart rate that she believes is related to getting into the building. No syncope near syncope.    Past Medical History:  Diagnosis Date   Arthritis    COPD (chronic obstructive pulmonary disease) (HCC)    Depression    E. coli pyelonephritis 6//2014   GERD (gastroesophageal reflux disease)    Pneumonia    Pulmonary hypertension (HCC)    Sepsis ([place])     Past Surgical History:  Procedure Laterality Date   ABDOMINAL HYSTERECTOMY     partial   AORTA - BILATERAL FEMORAL ARTERY BYPASS GRAFT Bilateral 06/14/2016   Procedure: AORTOBIFEMORAL BYPASS GRAFT AORTA SMA BYPASS GRAFT RE-IMPLANTATION  OF SUPERIOR MESENTERIC ARTERY;  Surgeon: Serafina Mitchell, MD;  Location: [facility];  Service: Vascular;  Laterality: Bilateral;   BACK SURGERY     L4/5   CARDIAC CATHETERIZATION N/A 01/03/2016   Procedure:  Right/Left Heart Cath and Coronary Angiography;  Surgeon: Jolaine Artist, MD;  Location: [facility];  Service: Cardiovascular;  Laterality: N/A;   COLONOSCOPY N/A 08/05/2015   Procedure: COLONOSCOPY;  Surgeon: Rogene Houston, MD;  Location: [facility];  Service: Endoscopy;  Laterality: N/A;  730   ELBOW SURGERY     IR IMAGING GUIDED PORT INSERTION  02/16/2020   PERIPHERAL VASCULAR CATHETERIZATION N/A 02/16/2016   Procedure: Abdominal Aortogram w/Lower Extremity;  Surgeon: Wellington Hampshire, MD;  Location: [facility];  Service: Cardiovascular;  Laterality: N/A;    Current Medications: Current Meds  Medication Sig   acetaminophen (TYLENOL) 500 MG tablet Take 500 mg by mouth every 6 (six) hours as needed for mild pain.   albuterol (PROVENTIL) (2.5 MG/3ML) 0.083% nebulizer solution Take 3 mLs (2.5 mg total) by nebulization every 6 (six) hours as needed for wheezing or shortness of breath.   aspirin EC 81 MG tablet Take 1 tablet (81 mg total) by mouth daily.   atorvastatin (LIPITOR) 80 MG tablet Take 1 tablet (80 mg total) by mouth daily.   fluticasone (CUTIVATE) 0.05 % cream Apply topically.   Fluticasone-Umeclidin-Vilant (TRELEGY ELLIPTA) 100-62.5-25 MCG/ACT AEPB Inhale 1 puff into the lungs daily.   furosemide (LASIX) 20 MG tablet Take 1 tablet (20 mg total) by mouth daily as needed (leg swelling).  levocetirizine (XYZAL) 5 MG tablet Take 5 mg by mouth every evening.    lidocaine-prilocaine (EMLA) cream Apply to the Port-A-Cath site 30-60-minute before chemotherapy every 3 weeks.   Multiple Vitamin (MULTIVITAMIN WITH MINERALS) TABS tablet Take 1 tablet by mouth daily.   mupirocin ointment (BACTROBAN) 2 % SMARTSIG:1 Application Topical 2-3 Times Daily   MYRBETRIQ 25 MG TB24 tablet Take 25 mg by mouth daily.   nitroGLYCERIN (NITROSTAT) 0.4 MG SL tablet Place 1 tablet (0.4 mg total) under the tongue every 5 (five) minutes as needed for chest pain.   omeprazole (PRILOSEC) 20 MG  capsule Take 20 mg by mouth daily.    OXYGEN Inhale 4 L into the lungs continuous.   prochlorperazine (COMPAZINE) 10 MG tablet Take 1 tablet (10 mg total) by mouth every 6 (six) hours as needed for nausea or vomiting.   sertraline (ZOLOFT) 100 MG tablet Take 100 mg by mouth daily.      Allergies:   Elemental sulfur   Social History   Socioeconomic History   Marital status: Married    Spouse name: Not on file   Number of children: Not on file   Years of education: Not on file   Highest education level: Not on file  Occupational History   Not on file  Tobacco Use   Smoking status: Every Day    Packs/day: 1.00    Years: 50.00    Total pack years: 50.00    Types: Cigarettes   Smokeless tobacco: Never   Tobacco comments:    smokes 1/2 pack per day- 08/31/2020  Vaping Use   Vaping Use: Never used  Substance and Sexual Activity   Alcohol use: No    Alcohol/week: 0.0 standard drinks of alcohol   Drug use: No   Sexual activity: Never  Other Topics Concern   Not on file  Social History Narrative   Not on file   Social Determinants of Health   Financial Resource Strain: Not on file  Food Insecurity: Not on file  Transportation Needs: Not on file  Physical Activity: Not on file  Stress: Not on file  Social Connections: Not on file    Social: lives with son, DIL and husband, daughter is a Marine scientist  Family History: The patient's family history includes Diabetes in her mother; Emphysema in her father.  ROS:   Please see the history of present illness.     All other systems reviewed and are negative.  EKGs/Labs/Other Studies Reviewed:    The following studies were reviewed today:  EKG:   08/18/21: SR 75 borderline anterior infarct   Cardiac Studies & Procedures   CARDIAC CATHETERIZATION  CARDIAC CATHETERIZATION 01/03/2016  Narrative  Prox RCA to Mid RCA lesion, 30% stenosed.  Mid RCA to Dist RCA lesion, 20% stenosed.  Mid LAD lesion, 40% stenosed.  2nd Diag  lesion, 50% stenosed.  Prox LAD to Mid LAD lesion, 20% stenosed.  Findings:  RA = 1 RV = 38/0/4 PA = 40/11 (26) PCW = 7 Fick cardiac output/index = 3.5/2.0 PVR = 5.0 WU Ao sat = 90% PA sat = 58%, 58%  Assessment: 1. Mild non-obstructive CAD with separate ostia for LAD and LCX 2. Minimally elevated R-sided pressures 3. Normal LV function 4. Moderately reduced cardiac output  Plan/Discussion:  Suspect dyspnea mostly due to lung disease. No significant PAH.  Bensimhon, Daniel,MD 12:14 PM  Findings Coronary Findings Diagnostic  Dominance: Right  Left Anterior Descending  Second Diagonal Branch  Left Circumflex .  Vessel is angiographically normal.  Right Coronary Artery  Intervention  No interventions have been documented.   STRESS TESTS  NM MYOCAR MULTI W/SPECT W 09/27/2021  Narrative   The study is normal. The study is low risk.   No ST deviation was noted.   LV perfusion is normal.   Left ventricular function is normal. End diastolic cavity size is normal.   ECHOCARDIOGRAM  ECHOCARDIOGRAM COMPLETE 10/07/2021  Narrative ECHOCARDIOGRAM REPORT    Patient Name:   Kayla Sawyer Date of Exam: 10/07/2021 Medical Rec #:  [id number]    Height:       68.0 in Accession #:    [id number]   Weight:       138.0 lb Date of Birth:  09-17-1945    BSA:          1.746 m Patient Age:    77 years     BP:           116/70 mmHg Patient Gender: F            HR:           89 bpm. Exam Location:  [facility]  Procedure: 2D Echo, Cardiac Doppler and Color Doppler  Indications:    C34.90 (ICD-10-CM) - Malignant neoplasm of lung, unspecified laterality, unspecified part of lung (HCC) R07.9 (ICD-10-CM) - Chest pain of uncertain etiology  History:        Patient has prior history of Echocardiogram examinations, most recent 06/16/2016. COPD; Risk Factors:Former Smoker. On home oxygen.  Sonographer:    Alvino Chapel RCS Referring Phys: [id number] [facility]  Laterrian Hevener  IMPRESSIONS   1. Left ventricular ejection fraction, by estimation, is 60 to 65%. The left ventricle has normal function. The left ventricle has no regional wall motion abnormalities. There is mild left ventricular hypertrophy. Left ventricular diastolic parameters are consistent with Grade I diastolic dysfunction (impaired relaxation). 2. Right ventricular systolic function is mildly reduced. The right ventricular size is normal. There is moderately elevated pulmonary artery systolic pressure. The estimated right ventricular systolic pressure is 08.1 mmHg. 3. A small pericardial effusion is present. 4. The mitral valve is degenerative. Trivial mitral valve regurgitation. No evidence of mitral stenosis. Moderate mitral annular calcification. 5. The aortic valve is tricuspid. Aortic valve regurgitation is not visualized. Aortic valve sclerosis is present, with no evidence of aortic valve stenosis. 6. The inferior vena cava is normal in size with greater than 50% respiratory variability, suggesting right atrial pressure of 3 mmHg.  FINDINGS Left Ventricle: Left ventricular ejection fraction, by estimation, is 60 to 65%. The left ventricle has normal function. The left ventricle has no regional wall motion abnormalities. The left ventricular internal cavity size was normal in size. There is mild left ventricular hypertrophy. Left ventricular diastolic parameters are consistent with Grade I diastolic dysfunction (impaired relaxation).  Right Ventricle: The right ventricular size is normal. No increase in right ventricular wall thickness. Right ventricular systolic function is mildly reduced. There is moderately elevated pulmonary artery systolic pressure. The tricuspid regurgitant velocity is 3.45 m/s, and with an assumed right atrial pressure of 3 mmHg, the estimated right ventricular systolic pressure is 44.8 mmHg.  Left Atrium: Left atrial size was normal in size.  Right Atrium:  Right atrial size was normal in size.  Pericardium: A small pericardial effusion is present.  Mitral Valve: The mitral valve is degenerative in appearance. Moderate mitral annular calcification. Trivial mitral valve regurgitation. No evidence of mitral valve stenosis.  Tricuspid  Valve: The tricuspid valve is normal in structure. Tricuspid valve regurgitation is mild.  Aortic Valve: The aortic valve is tricuspid. Aortic valve regurgitation is not visualized. Aortic valve sclerosis is present, with no evidence of aortic valve stenosis.  Pulmonic Valve: The pulmonic valve was not well visualized. Pulmonic valve regurgitation is trivial.  Aorta: The aortic root is normal in size and structure.  Venous: The inferior vena cava is normal in size with greater than 50% respiratory variability, suggesting right atrial pressure of 3 mmHg.  IAS/Shunts: No atrial level shunt detected by color flow Doppler.   LEFT VENTRICLE PLAX 2D LVIDd:         3.70 cm   Diastology LVIDs:         2.60 cm   LV e' medial:    4.13 cm/s LV PW:         1.00 cm   LV E/e' medial:  12.8 LV IVS:        1.30 cm   LV e' lateral:   8.05 cm/s LVOT diam:     1.90 cm   LV E/e' lateral: 6.5 LV SV:         47 LV SV Index:   27 LVOT Area:     2.84 cm   RIGHT VENTRICLE RV S prime:     7.29 cm/s TAPSE (M-mode): 1.2 cm  LEFT ATRIUM             Index        RIGHT ATRIUM           Index LA diam:        2.70 cm 1.55 cm/m   RA Area:     13.00 cm LA Vol (A2C):   54.1 ml 30.99 ml/m  RA Volume:   28.00 ml  16.04 ml/m LA Vol (A4C):   37.5 ml 21.48 ml/m LA Biplane Vol: 45.4 ml 26.01 ml/m AORTIC VALVE LVOT Vmax:   79.60 cm/s LVOT Vmean:  48.800 cm/s LVOT VTI:    0.166 m  AORTA Ao Root diam: 3.20 cm  MITRAL VALVE               TRICUSPID VALVE MV Area (PHT): 4.80 cm    TR Peak grad:   47.6 mmHg MV Decel Time: 158 msec    TR Vmax:        345.00 cm/s MV E velocity: 52.70 cm/s MV A velocity: 94.70 cm/s  SHUNTS MV E/A  ratio:  0.56        Systemic VTI:  0.17 m Systemic Diam: 1.90 cm  Oswaldo Milian MD Electronically signed by Oswaldo Milian MD Signature Date/Time: 10/07/2021/7:10:12 PM    Final               Recent Labs: 07/31/2022: ALT 11; BUN 36; Creatinine 1.05; Hemoglobin 11.5; Platelet Count 220; Potassium 3.6; Sodium 143  Recent Lipid Panel No results found for: "CHOL", "TRIG", "HDL", "CHOLHDL", "VLDL", "LDLCALC", "LDLDIRECT"       Physical Exam:    VS:  BP (!) 92/58 (BP Location: Left Arm, Patient Position: Sitting, Cuff Size: Normal)   Pulse (!) 130   Ht 5' 7"  (1.702 m)   Wt 132 lb 3.2 oz (60 kg)   SpO2 93%   BMI 20.71 kg/m     Wt Readings from Last 3 Encounters:  08/15/22 132 lb 3.2 oz (60 kg)  08/02/22 132 lb 9 oz (60.1 kg)  06/26/22 135 lb (61.2 kg)    Gen:  no distress  Neck: No JVD  Cardiac: No Rubs or Gallops, no murmur, regular tachycardia+2 radial pulses; No RV heave Respiratory: Clear to auscultation bilaterally, normal effort, normal  respiratory rate GI: Soft, nontender, non-distended  MS: no edema  moves all extremities Integument: Skin feels warm Neuro:  At time of evaluation, alert and oriented to person/place/time/situation  Psych: Normal affect, patient feels well  ASSESSMENT:    No diagnosis found.  PLAN:    NSCLC after cardiotoxic chemo, immunotherapy, and high dose radiation  Moderate circumferential pericardial effusion with hypotension and tachycardia (without JVD) - discussed going to ED for stat Echo: this would be to rule out cardiac tamponade - family not amenable at this time; she feels fine - we have expedited an echo for tomorrow morning - discussed with Dr. Julien Nordmann; getting cytology would affect treatment plan - consented for pericardiocentesis - if evidence of tampondate or increased pericardial pressure, pericardiocentesis tomorrow - if not, schedule as this week   CAD mild non-obstructive PAD hx of aortobifemoral  bypass graft COPD with secondary PH on 4 L O2 but no active wheezing Group II/III Pulmonary HTN - ASA 81 mg PO daily - continue atorvastatin 80 mg  - no BB given COPD and low BP        Medication Adjustments/Labs and Tests Ordered: Current medicines are reviewed at length with the patient today.  Concerns regarding medicines are outlined above.  No orders of the defined types were placed in this encounter.  No orders of the defined types were placed in this encounter.   There are no Patient Instructions on file for this visit.   Signed, Werner Lean, MD  08/15/2022 3:30 PM    [place]

## 2022-08-15 ENCOUNTER — Ambulatory Visit: Payer: Medicare Other | Attending: Internal Medicine | Admitting: Internal Medicine

## 2022-08-15 ENCOUNTER — Encounter: Payer: Self-pay | Admitting: Internal Medicine

## 2022-08-15 VITALS — BP 92/58 | HR 130 | Ht 67.0 in | Wt 132.2 lb

## 2022-08-15 DIAGNOSIS — I3139 Other pericardial effusion (noninflammatory): Secondary | ICD-10-CM | POA: Diagnosis not present

## 2022-08-15 NOTE — Patient Instructions (Signed)
Medication Instructions:  Your physician recommends that you continue on your current medications as directed. Please refer to the Current Medication list given to you today.  *If you need a refill on your cardiac medications before your next appointment, please call your pharmacy*   Lab Work: NONE If you have labs (blood work) drawn today and your tests are completely normal, you will receive your results only by: [place] (if you have MyChart) OR A paper copy in the mail If you have any lab test that is abnormal or we need to change your treatment, we will call you to review the results.   Testing/Procedures: Your physician has requested that you have an echocardiogram. Echocardiography is a painless test that uses sound waves to create images of your heart. It provides your doctor with information about the size and shape of your heart and how well your heart's chambers and valves are working. This procedure takes approximately one hour. There are no restrictions for this procedure. Please do NOT wear cologne, perfume, aftershave, or lotions (deodorant is allowed). Please arrive 15 minutes prior to your appointment time.    Follow-Up:To be determined At [facility], you and your health needs are our priority.  As part of our continuing mission to provide you with exceptional heart care, we have created designated Provider Care Teams.  These Care Teams include your primary Cardiologist (physician) and Advanced Practice Providers (APPs -  Physician Assistants and Nurse Practitioners) who all work together to provide you with the care you need, when you need it.  We recommend signing up for the patient portal called "MyChart".  Sign up information is provided on this After Visit Summary.  MyChart is used to connect with patients for Virtual Visits (Telemedicine).  Patients are able to view lab/test results, encounter notes, upcoming appointments, etc.  Non-urgent messages can  be sent to your provider as well.   To learn more about what you can do with MyChart, go to [email or web address].     Provider:   Werner Lean, MD

## 2022-08-16 ENCOUNTER — Ambulatory Visit (HOSPITAL_COMMUNITY): Payer: Medicare Other | Attending: Cardiology

## 2022-08-16 DIAGNOSIS — I3139 Other pericardial effusion (noninflammatory): Secondary | ICD-10-CM | POA: Diagnosis not present

## 2022-08-16 LAB — ECHOCARDIOGRAM COMPLETE
Area-P 1/2: 4.15 cm2
S' Lateral: 2.2 cm

## 2022-08-17 ENCOUNTER — Telehealth: Payer: Self-pay

## 2022-08-17 NOTE — Telephone Encounter (Signed)
The patient has been notified of the result and verbalized understanding.  All questions (if any) were answered. Precious Gilding, RN 08/17/2022 2:34 PM   Pt scheduled for pericardial centesis on 08/22/22 at 10:30 am with Dr. Martin[place]. Advised pt to be at the hospital by 8:30 am Main Entrance A.  NPO after MN.  Verbally reviewed instructions with pt as well as posted on my chart for pt to review.

## 2022-08-18 ENCOUNTER — Other Ambulatory Visit: Payer: Self-pay | Admitting: Internal Medicine

## 2022-08-18 DIAGNOSIS — I3139 Other pericardial effusion (noninflammatory): Secondary | ICD-10-CM

## 2022-08-18 MED ORDER — SODIUM CHLORIDE 0.9% FLUSH: 3.0000 mL | Freq: Two times a day (BID) | INTRAVENOUS | Status: DC

## 2022-08-18 NOTE — Progress Notes (Signed)
Needs Diagnosis pericardiocentesis No evidence of tamponade but needs this for his oncology management Needs cytology and fluid analysis; results to Dr. Curt Bears.  Consented for procedure.    Rudean Haskell, MD [address] [place], [place] [address] [phone]  1:00 PM

## 2022-08-21 ENCOUNTER — Telehealth: Payer: Self-pay | Admitting: *Deleted

## 2022-08-21 NOTE — Telephone Encounter (Signed)
Pericardiocentesis scheduled at [facility] [id number] August 22, 2022 10:30 AM Arrival time and place: [facility] Main Entrance A at: 8:30 AM  Do not eat or drink after midnight, may have sips of water to take medications.  Medication instructions: -Hold:  Lasix- AM of procedure -Except hold medications usual morning medications can be taken with sips of water.  Confirmed patient has responsible adult to drive home post procedure and be with patient first 24 hours after arriving home.  Patient reports no new symptoms concerning for COVID-19 in the past 10 days.  Reviewed procedure instructions with patient.

## 2022-08-21 NOTE — Telephone Encounter (Signed)
Kayla Sawyer Echo Department, has been notified of date/time of pericardiocentesis 08/22/22 at 10:30 AM [facility].

## 2022-08-22 ENCOUNTER — Encounter (HOSPITAL_COMMUNITY)
Admission: RE | Disposition: A | Discharge status: Home / Self Care | Payer: Self-pay | Source: Home / Self Care | Attending: Cardiology

## 2022-08-22 ENCOUNTER — Ambulatory Visit (HOSPITAL_BASED_OUTPATIENT_CLINIC_OR_DEPARTMENT_OTHER): Payer: Medicare Other

## 2022-08-22 ENCOUNTER — Other Ambulatory Visit: Payer: Self-pay

## 2022-08-22 ENCOUNTER — Encounter (HOSPITAL_COMMUNITY): Payer: Self-pay | Admitting: Cardiology

## 2022-08-22 ENCOUNTER — Ambulatory Visit (HOSPITAL_COMMUNITY)
Admission: RE | Admit: 2022-08-22 | Discharge: 2022-08-22 | Disposition: A | Discharge status: Home / Self Care | Payer: Medicare Other | Attending: Cardiology | Admitting: Cardiology

## 2022-08-22 DIAGNOSIS — I3139 Other pericardial effusion (noninflammatory): Secondary | ICD-10-CM | POA: Diagnosis not present

## 2022-08-22 DIAGNOSIS — Z85118 Personal history of other malignant neoplasm of bronchus and lung: Secondary | ICD-10-CM | POA: Diagnosis not present

## 2022-08-22 DIAGNOSIS — F17210 Nicotine dependence, cigarettes, uncomplicated: Secondary | ICD-10-CM | POA: Insufficient documentation

## 2022-08-22 DIAGNOSIS — Z836 Family history of other diseases of the respiratory system: Secondary | ICD-10-CM | POA: Diagnosis not present

## 2022-08-22 DIAGNOSIS — I2510 Atherosclerotic heart disease of native coronary artery without angina pectoris: Secondary | ICD-10-CM | POA: Insufficient documentation

## 2022-08-22 DIAGNOSIS — J449 Chronic obstructive pulmonary disease, unspecified: Secondary | ICD-10-CM | POA: Insufficient documentation

## 2022-08-22 HISTORY — PX: PERICARDIOCENTESIS: CATH118255

## 2022-08-22 LAB — ECHOCARDIOGRAM LIMITED
Height: 67 in
Weight: 2112 oz

## 2022-08-22 LAB — GRAM STAIN

## 2022-08-22 LAB — BODY FLUID CELL COUNT WITH DIFFERENTIAL
Eos, Fluid: 0 %
Lymphs, Fluid: 33 %
Monocyte-Macrophage-Serous Fluid: 31 % — ABNORMAL LOW (ref 50–90)
Neutrophil Count, Fluid: 36 % — ABNORMAL HIGH (ref 0–25)
Total Nucleated Cell Count, Fluid: 111 cu mm (ref 0–1000)

## 2022-08-22 SURGERY — PERICARDIOCENTESIS
Anesthesia: LOCAL

## 2022-08-22 MED ORDER — FENTANYL CITRATE (PF) 100 MCG/2ML IJ SOLN
INTRAMUSCULAR | Status: AC
  Filled 2022-08-22: qty 2

## 2022-08-22 MED ORDER — HEPARIN (PORCINE) IN NACL 1000-0.9 UT/500ML-% IV SOLN
INTRAVENOUS | Status: DC | PRN
  Administered 2022-08-22: 500 mL

## 2022-08-22 MED ORDER — ASPIRIN 81 MG PO CHEW
81.0000 mg | CHEWABLE_TABLET | ORAL | Status: AC
  Administered 2022-08-22: 81 mg via ORAL
  Filled 2022-08-22: qty 1

## 2022-08-22 MED ORDER — SODIUM CHLORIDE 0.9 % WEIGHT BASED INFUSION
3.0000 mL/kg/h | INTRAVENOUS | Status: AC
  Administered 2022-08-22: 3 mL/kg/h via INTRAVENOUS

## 2022-08-22 MED ORDER — SODIUM CHLORIDE 0.9% FLUSH: 3.0000 mL | INTRAVENOUS | Status: DC | PRN

## 2022-08-22 MED ORDER — MIDAZOLAM HCL 2 MG/2ML IJ SOLN
INTRAMUSCULAR | Status: DC | PRN
  Administered 2022-08-22: 1 mg via INTRAVENOUS

## 2022-08-22 MED ORDER — SODIUM CHLORIDE 0.9 % IV SOLN: 250.0000 mL | INTRAVENOUS | Status: DC | PRN

## 2022-08-22 MED ORDER — LIDOCAINE HCL (PF) 1 % IJ SOLN
INTRAMUSCULAR | Status: DC | PRN
  Administered 2022-08-22: 10 mL

## 2022-08-22 MED ORDER — SODIUM CHLORIDE 0.9 % WEIGHT BASED INFUSION: 1.0000 mL/kg/h | INTRAVENOUS | Status: DC

## 2022-08-22 MED ORDER — MIDAZOLAM HCL 2 MG/2ML IJ SOLN
INTRAMUSCULAR | Status: AC
  Filled 2022-08-22: qty 2

## 2022-08-22 MED ORDER — LIDOCAINE HCL (PF) 1 % IJ SOLN
INTRAMUSCULAR | Status: AC
  Filled 2022-08-22: qty 30

## 2022-08-22 MED ORDER — FENTANYL CITRATE (PF) 100 MCG/2ML IJ SOLN
INTRAMUSCULAR | Status: DC | PRN
  Administered 2022-08-22: 25 ug via INTRAVENOUS

## 2022-08-22 SURGICAL SUPPLY — 3 items
KIT MICROPUNCTURE NIT STIFF (SHEATH) IMPLANT
PACK CARDIAC CATHETERIZATION (CUSTOM PROCEDURE TRAY) IMPLANT
TRAY PERICARDIOCENTESIS 6FX60 (TRAY / TRAY PROCEDURE) IMPLANT

## 2022-08-22 NOTE — Interval H&P Note (Signed)
History and Physical Interval Note:  08/22/2022 9:51 AM  Kayla Sawyer  has presented today for surgery, with the diagnosis of PERICARDIAL EFFUSION.  The various methods of treatment have been discussed with the patient and family. After consideration of risks, benefits and other options for treatment, the patient has consented to  Procedure(s): PERICARDIOCENTESIS (N/A) as a surgical intervention.  The patient's history has been reviewed, patient examined, no change in status, stable for surgery.  I have reviewed the patient's chart and labs.  Questions were answered to the patient's satisfaction.     Collier [facility] 08/22/2022 9:51 AM

## 2022-08-23 LAB — PROTEIN, BODY FLUID (OTHER): Total Protein, Body Fluid Other: 4.3 g/dL

## 2022-08-23 LAB — LD, BODY FLUID (OTHER): LD, Body Fluid: 121 IU/L

## 2022-08-23 LAB — GLUCOSE, BODY FLUID OTHER: Glucose, Body Fluid Other: 98 mg/dL

## 2022-08-24 LAB — CYTOLOGY - NON PAP

## 2022-08-25 DIAGNOSIS — J9611 Chronic respiratory failure with hypoxia: Secondary | ICD-10-CM | POA: Diagnosis not present

## 2022-08-27 LAB — CULTURE, BODY FLUID W GRAM STAIN -BOTTLE: Culture: NO GROWTH

## 2022-09-07 ENCOUNTER — Encounter: Payer: Self-pay | Admitting: Podiatry

## 2022-09-12 ENCOUNTER — Encounter: Payer: Self-pay | Admitting: Podiatry

## 2022-09-13 ENCOUNTER — Encounter: Payer: Self-pay | Admitting: Internal Medicine

## 2022-09-15 DIAGNOSIS — D6481 Anemia due to antineoplastic chemotherapy: Secondary | ICD-10-CM | POA: Diagnosis not present

## 2022-09-15 DIAGNOSIS — R7303 Prediabetes: Secondary | ICD-10-CM | POA: Diagnosis not present

## 2022-09-15 DIAGNOSIS — E782 Mixed hyperlipidemia: Secondary | ICD-10-CM | POA: Diagnosis not present

## 2022-09-17 LAB — LAB REPORT - SCANNED
A1c: 5.6
Albumin, Urine POC: 20.8
Creatinine, POC: 65.4 mg/dL
EGFR: 46
Microalb Creat Ratio: 32

## 2022-09-21 DIAGNOSIS — J449 Chronic obstructive pulmonary disease, unspecified: Secondary | ICD-10-CM | POA: Diagnosis not present

## 2022-09-21 DIAGNOSIS — Z0001 Encounter for general adult medical examination with abnormal findings: Secondary | ICD-10-CM | POA: Diagnosis not present

## 2022-09-21 DIAGNOSIS — N3281 Overactive bladder: Secondary | ICD-10-CM | POA: Diagnosis not present

## 2022-09-21 DIAGNOSIS — R5383 Other fatigue: Secondary | ICD-10-CM | POA: Diagnosis not present

## 2022-09-21 DIAGNOSIS — F17210 Nicotine dependence, cigarettes, uncomplicated: Secondary | ICD-10-CM | POA: Diagnosis not present

## 2022-09-21 DIAGNOSIS — K219 Gastro-esophageal reflux disease without esophagitis: Secondary | ICD-10-CM | POA: Diagnosis not present

## 2022-09-21 DIAGNOSIS — J439 Emphysema, unspecified: Secondary | ICD-10-CM | POA: Diagnosis not present

## 2022-09-21 DIAGNOSIS — F17219 Nicotine dependence, cigarettes, with unspecified nicotine-induced disorders: Secondary | ICD-10-CM | POA: Diagnosis not present

## 2022-09-21 DIAGNOSIS — E782 Mixed hyperlipidemia: Secondary | ICD-10-CM | POA: Diagnosis not present

## 2022-09-21 DIAGNOSIS — R7303 Prediabetes: Secondary | ICD-10-CM | POA: Diagnosis not present

## 2022-09-21 DIAGNOSIS — I739 Peripheral vascular disease, unspecified: Secondary | ICD-10-CM | POA: Diagnosis not present

## 2022-09-25 ENCOUNTER — Ambulatory Visit: Payer: Medicare Other | Admitting: Internal Medicine

## 2022-09-26 NOTE — Progress Notes (Signed)
Office Visit    Patient Name: Kayla Sawyer Date of Encounter: 09/27/2022  PCP:  [name], MD   [facility]  Cardiologist:  Christell Constant, MD  Advanced Practice Provider:  No care team member to display Electrophysiologist:  None   HPI    Kayla Sawyer is a 77 y.o. female with a past medical history of COPD with secondary PAH, active smoker on 4 L, NSCLC-adenocarcinoma on palliative radiotherapy and carboplatin and Keytruda therapy (stopped for noncardiac intolerance), nonobstructive CAD (mild in 2017), no evidence of PAH, PAD with history of aortobifemoral bypass graft with aortic atherosclerosis presents today for follow-up appointment.  In 2023 she had a negative stress test, complained of chest pain.  Echocardiogram with MAC and elevated RVSP otherwise WNL.  Her statin was increased at this time.  Patient was doing well at last follow-up appointment 08/15/2022.  She thought her cancer treatment was going well.  There were no interval hospital/ED visits.  No chest pain or pressure.  No DOE/SOB or PND/orthopnea.  No weight gain or leg swelling.  No palpitations or syncope.  She did have a follow-up CT.  She had an increase in her pericardial effusion.  New elevated heart rate that she believed was related to getting into the building for her appointment.  No syncope or near syncope. Pericardiocentesis, CT scan in about a month, Dr. Gwenyth Bouillon.   Today, she states that she went to her regular doctor last Thursday and got a clean bill of health.  She has been on oxygen for years (started in 2021).  No lower extremity edema or weight gain.  She actually continues to lose weight and struggles to get enough calories throughout the day.  Has low energy in general.  She and her husband moved in with one of their children about a year ago so that they could get more help.  Recent lab work reviewed with the patient.  She has a follow-up CT scan in about a month with  Dr. Shirline Frees to reevaluate her pericardial effusion.  Reports no shortness of breath nor dyspnea on exertion. Reports no chest pain, pressure, or tightness. No edema, orthopnea, PND. Reports no palpitations.   Past Medical History    Past Medical History:  Diagnosis Date   Arthritis    COPD (chronic obstructive pulmonary disease) (HCC)    Depression    E. coli pyelonephritis 6//2014   GERD (gastroesophageal reflux disease)    Pneumonia    Pulmonary hypertension (HCC)    Sepsis (HCC)    Past Surgical History:  Procedure Laterality Date   ABDOMINAL HYSTERECTOMY     partial   AORTA - BILATERAL FEMORAL ARTERY BYPASS GRAFT Bilateral 06/14/2016   Procedure: AORTOBIFEMORAL BYPASS GRAFT AORTA SMA BYPASS GRAFT RE-IMPLANTATION  OF SUPERIOR MESENTERIC ARTERY;  Surgeon: Nada Libman, MD;  Location: [facility] OR;  Service: Vascular;  Laterality: Bilateral;   BACK SURGERY     L4/5   CARDIAC CATHETERIZATION N/A 01/03/2016   Procedure: Right/Left Heart Cath and Coronary Angiography;  Surgeon: Dolores Patty, MD;  Location: [facility] INVASIVE CV LAB;  Service: Cardiovascular;  Laterality: N/A;   COLONOSCOPY N/A 08/05/2015   Procedure: COLONOSCOPY;  Surgeon: Malissa Hippo, MD;  [facility]: [facility];  Service: Endoscopy;  Laterality: N/A;  730   ELBOW SURGERY     IR IMAGING GUIDED PORT INSERTION  02/16/2020   PERICARDIOCENTESIS N/A 08/22/2022   Procedure: PERICARDIOCENTESIS;  Surgeon: [place], Peter M,  MD;  Location: [facility] INVASIVE CV LAB;  Service: Cardiovascular;  Laterality: N/A;   PERIPHERAL VASCULAR CATHETERIZATION N/A 02/16/2016   Procedure: Abdominal Aortogram w/Lower Extremity;  Surgeon: Iran Ouch, MD;  Location: [facility] INVASIVE CV LAB;  Service: Cardiovascular;  Laterality: N/A;    Allergies  Allergies  Allergen Reactions   Elemental Sulfur Nausea Only    EKGs/Labs/Other Studies Reviewed:   The following studies were reviewed today:  Pericardial cytology: 08/22/22 All values are normal or  within acceptable limits.   Medication changes / Follow up labs / Other changes or recommendations:   Pericardial fluid analysis is normal. No infection. No cancer seen on cytology  EKG:  EKG is not ordered today.   Recent Labs: 07/31/2022: ALT 11; BUN 36; Creatinine 1.05; Hemoglobin 11.5; Platelet Count 220; Potassium 3.6; Sodium 143  Recent Lipid Panel No results found for: "CHOL", "TRIG", "HDL", "CHOLHDL", "VLDL", "LDLCALC", "LDLDIRECT"  Home Medications   Current Meds  Medication Sig   acetaminophen (TYLENOL) 650 MG CR tablet Take 650-1,300 mg by mouth every 8 (eight) hours as needed for pain.   albuterol (PROVENTIL) (2.5 MG/3ML) 0.083% nebulizer solution Take 3 mLs (2.5 mg total) by nebulization every 6 (six) hours as needed for wheezing or shortness of breath.   aspirin EC 81 MG tablet Take 1 tablet (81 mg total) by mouth daily.   atorvastatin (LIPITOR) 80 MG tablet Take 1 tablet (80 mg total) by mouth daily.   Fluticasone-Umeclidin-Vilant (TRELEGY ELLIPTA) 100-62.5-25 MCG/ACT AEPB Inhale 1 puff into the lungs daily.   levocetirizine (XYZAL) 5 MG tablet Take 5 mg by mouth in the morning.   Multiple Vitamin (MULTIVITAMIN WITH MINERALS) TABS tablet Take 1 tablet by mouth in the morning.   MYRBETRIQ 25 MG TB24 tablet Take 25 mg by mouth in the morning.   nitroGLYCERIN (NITROSTAT) 0.4 MG SL tablet Place 1 tablet (0.4 mg total) under the tongue every 5 (five) minutes as needed for chest pain.   omeprazole (PRILOSEC) 20 MG capsule Take 20 mg by mouth daily before breakfast.   OXYGEN Inhale 4 L into the lungs continuous.   prochlorperazine (COMPAZINE) 10 MG tablet Take 1 tablet (10 mg total) by mouth every 6 (six) hours as needed for nausea or vomiting.   sertraline (ZOLOFT) 100 MG tablet Take 100 mg by mouth daily.      Review of Systems      All other systems reviewed and are otherwise negative except as noted above.  Physical Exam    VS:  BP 104/60   Pulse 93   Ht 5\' 7"  (1.702  m)   Wt 131 lb 12.8 oz (59.8 kg)   SpO2 98%   BMI 20.64 kg/m  , BMI Body mass index is 20.64 kg/m.  Wt Readings from Last 3 Encounters:  09/27/22 131 lb 12.8 oz (59.8 kg)  08/22/22 132 lb (59.9 kg)  08/15/22 132 lb 3.2 oz (60 kg)     GEN: Well nourished, well developed, in no acute distress. HEENT: normal. Neck: Supple, no JVD, carotid bruits, or masses. Cardiac: RRR, no murmurs, rubs, or gallops. No clubbing, cyanosis, edema.  Radials/PT 2+ and equal bilaterally.  Respiratory:  Respirations regular and unlabored, clear to auscultation bilaterally. GI: Soft, nontender, nondistended. MS: No deformity or atrophy. Skin: Warm and dry, no rash. Neuro:  Strength and sensation are intact. Psych: Normal affect.  Assessment & Plan    Pericardial effusion  -She is not fluid overloaded on exam today -No tamponade like  physiology -no pressure or chest pain -Cytology report reviewed without any cancer cells noted -Follow-up CT scan next month with Dr. Rebecca Eaton office  Nonobstructive CAD -Continue current medications which include aspirin 81 mg daily, Lipitor 80 mg daily, nitro as needed  NSCLC after cardiotoxic chemo, immunotherapy, and high-dose radiation -stat echocardiogram obtained for pericardial effusion, consented for pericardiocentesis  PAD -no new issues   COPD with secondary PAH on 4 L of oxygen -stable        Disposition: Follow up 3-4 months with Christell Constant, MD or APP.  Signed, Sharlene Dory, PA-C 09/27/2022, 12:47 PM [facility]

## 2022-09-27 ENCOUNTER — Ambulatory Visit: Payer: Medicare Other | Attending: Physician Assistant | Admitting: Physician Assistant

## 2022-09-27 ENCOUNTER — Encounter: Payer: Self-pay | Admitting: Physician Assistant

## 2022-09-27 VITALS — BP 104/60 | HR 93 | Ht 67.0 in | Wt 131.8 lb

## 2022-09-27 DIAGNOSIS — I739 Peripheral vascular disease, unspecified: Secondary | ICD-10-CM | POA: Diagnosis not present

## 2022-09-27 DIAGNOSIS — C3492 Malignant neoplasm of unspecified part of left bronchus or lung: Secondary | ICD-10-CM

## 2022-09-27 DIAGNOSIS — I2510 Atherosclerotic heart disease of native coronary artery without angina pectoris: Secondary | ICD-10-CM | POA: Diagnosis not present

## 2022-09-27 DIAGNOSIS — C3490 Malignant neoplasm of unspecified part of unspecified bronchus or lung: Secondary | ICD-10-CM

## 2022-09-27 DIAGNOSIS — I3139 Other pericardial effusion (noninflammatory): Secondary | ICD-10-CM

## 2022-09-27 DIAGNOSIS — I272 Pulmonary hypertension, unspecified: Secondary | ICD-10-CM

## 2022-09-27 DIAGNOSIS — R079 Chest pain, unspecified: Secondary | ICD-10-CM | POA: Diagnosis not present

## 2022-09-27 NOTE — Patient Instructions (Signed)
Medication Instructions:  Your physician recommends that you continue on your current medications as directed. Please refer to the Current Medication list given to you today.  *If you need a refill on your cardiac medications before your next appointment, please call your pharmacy*   Lab Work: None If you have labs (blood work) drawn today and your tests are completely normal, you will receive your results only by: [place] (if you have MyChart) OR A paper copy in the mail If you have any lab test that is abnormal or we need to change your treatment, we will call you to review the results.   Testing/Procedures: Have Dr Julien Nordmann fax [place] the results of your CT [phone]   Follow-Up: At [facility], you and your health needs are our priority.  As part of our continuing mission to provide you with exceptional heart care, we have created designated Provider Care Teams.  These Care Teams include your primary Cardiologist (physician) and Advanced Practice Providers (APPs -  Physician Assistants and Nurse Practitioners) who all work together to provide you with the care you need, when you need it.   Your next appointment:   3-4 month(s)  Provider:   Werner Lean, MD

## 2022-09-29 ENCOUNTER — Ambulatory Visit: Payer: Medicare Other | Admitting: Podiatry

## 2022-10-04 ENCOUNTER — Encounter: Payer: Self-pay | Admitting: Internal Medicine

## 2022-10-04 DIAGNOSIS — J449 Chronic obstructive pulmonary disease, unspecified: Secondary | ICD-10-CM | POA: Diagnosis not present

## 2022-11-03 ENCOUNTER — Ambulatory Visit (HOSPITAL_COMMUNITY)
Admission: RE | Admit: 2022-11-03 | Discharge: 2022-11-03 | Disposition: A | Discharge status: Home / Self Care | Payer: Medicare Other | Source: Ambulatory Visit | Attending: Internal Medicine | Admitting: Internal Medicine

## 2022-11-03 DIAGNOSIS — R918 Other nonspecific abnormal finding of lung field: Secondary | ICD-10-CM | POA: Diagnosis not present

## 2022-11-03 DIAGNOSIS — J432 Centrilobular emphysema: Secondary | ICD-10-CM | POA: Diagnosis not present

## 2022-11-03 DIAGNOSIS — C3490 Malignant neoplasm of unspecified part of unspecified bronchus or lung: Secondary | ICD-10-CM | POA: Insufficient documentation

## 2022-11-03 MED ORDER — SODIUM CHLORIDE (PF) 0.9 % IJ SOLN
INTRAMUSCULAR | Status: AC
  Filled 2022-11-03: qty 50

## 2022-11-03 MED ORDER — IOHEXOL 300 MG/ML  SOLN
60.0000 mL | Freq: Once | INTRAMUSCULAR | Status: AC | PRN
  Administered 2022-11-03: 60 mL via INTRAVENOUS

## 2022-11-04 DIAGNOSIS — J449 Chronic obstructive pulmonary disease, unspecified: Secondary | ICD-10-CM | POA: Diagnosis not present

## 2022-11-07 ENCOUNTER — Inpatient Hospital Stay: Payer: Medicare Other | Attending: Internal Medicine

## 2022-11-07 ENCOUNTER — Other Ambulatory Visit: Payer: Self-pay

## 2022-11-07 ENCOUNTER — Inpatient Hospital Stay: Payer: Medicare Other | Admitting: Internal Medicine

## 2022-11-07 VITALS — BP 95/64 | HR 112 | Temp 97.8°F | Resp 18 | Wt 130.8 lb

## 2022-11-07 DIAGNOSIS — Z85118 Personal history of other malignant neoplasm of bronchus and lung: Secondary | ICD-10-CM | POA: Insufficient documentation

## 2022-11-07 DIAGNOSIS — C3490 Malignant neoplasm of unspecified part of unspecified bronchus or lung: Secondary | ICD-10-CM | POA: Diagnosis not present

## 2022-11-07 DIAGNOSIS — Z08 Encounter for follow-up examination after completed treatment for malignant neoplasm: Secondary | ICD-10-CM | POA: Diagnosis not present

## 2022-11-07 DIAGNOSIS — J449 Chronic obstructive pulmonary disease, unspecified: Secondary | ICD-10-CM | POA: Diagnosis not present

## 2022-11-07 DIAGNOSIS — C3492 Malignant neoplasm of unspecified part of left bronchus or lung: Secondary | ICD-10-CM

## 2022-11-07 LAB — CBC WITH DIFFERENTIAL (CANCER CENTER ONLY)
Abs Immature Granulocytes: 0.05 10*3/uL (ref 0.00–0.07)
Basophils Absolute: 0.1 10*3/uL (ref 0.0–0.1)
Basophils Relative: 1 %
Eosinophils Absolute: 0.1 10*3/uL (ref 0.0–0.5)
Eosinophils Relative: 1 %
HCT: 35.6 % — ABNORMAL LOW (ref 36.0–46.0)
Hemoglobin: 11.9 g/dL — ABNORMAL LOW (ref 12.0–15.0)
Immature Granulocytes: 1 %
Lymphocytes Relative: 19 %
Lymphs Abs: 1.4 10*3/uL (ref 0.7–4.0)
MCH: 30.5 pg (ref 26.0–34.0)
MCHC: 33.4 g/dL (ref 30.0–36.0)
MCV: 91.3 fL (ref 80.0–100.0)
Monocytes Absolute: 0.9 10*3/uL (ref 0.1–1.0)
Monocytes Relative: 13 %
Neutro Abs: 4.7 10*3/uL (ref 1.7–7.7)
Neutrophils Relative %: 65 %
Platelet Count: 200 10*3/uL (ref 150–400)
RBC: 3.9 MIL/uL (ref 3.87–5.11)
RDW: 14.4 % (ref 11.5–15.5)
WBC Count: 7.2 10*3/uL (ref 4.0–10.5)
nRBC: 0 % (ref 0.0–0.2)

## 2022-11-07 LAB — CMP (CANCER CENTER ONLY)
ALT: 12 U/L (ref 0–44)
AST: 15 U/L (ref 15–41)
Albumin: 4.1 g/dL (ref 3.5–5.0)
Alkaline Phosphatase: 49 U/L (ref 38–126)
Anion gap: 9 (ref 5–15)
BUN: 26 mg/dL — ABNORMAL HIGH (ref 8–23)
CO2: 29 mmol/L (ref 22–32)
Calcium: 9.8 mg/dL (ref 8.9–10.3)
Chloride: 105 mmol/L (ref 98–111)
Creatinine: 1.19 mg/dL — ABNORMAL HIGH (ref 0.44–1.00)
GFR, Estimated: 47 mL/min — ABNORMAL LOW (ref >60)
Glucose, Bld: 95 mg/dL (ref 70–99)
Potassium: 3.8 mmol/L (ref 3.5–5.1)
Sodium: 143 mmol/L (ref 135–145)
Total Bilirubin: 0.4 mg/dL (ref 0.3–1.2)
Total Protein: 6.7 g/dL (ref 6.5–8.1)

## 2022-11-07 NOTE — Progress Notes (Signed)
[facility] [phone]   [phone]  OFFICE PROGRESS NOTE  [name], MD [address] [place] [address]  DIAGNOSIS: Stage IV (T3, N2, M1a) non-small cell lung cancer, poorly differentiated adenocarcinoma presented with large left upper lobe lung mass in addition to mediastinal lymphadenopathy and bilateral pulmonary nodules diagnosed in June 2021.  Biomarker Findings Tumor Mutational Burden - 10 Muts/Mb Microsatellite status - MS-Stable Genomic Findings For a complete list of the genes assayed, please refer to the Appendix. NF1 Q2582* KRAS G12V CTNNB1 A623fs*10 TP53 H179N 7 Disease relevant genes with no reportable alterations: ALK, BRAF, EGFR, ERBB2, MET, RET, ROS1  PDL1 Expression: 100%  PRIOR THERAPY:  1) Palliative radiotherapy to the large left upper lobe lung mass. 2) Systemic chemotherapy with carboplatin for AUC of 5, Alimta 500 mg/M2 and Keytruda 200 mg IV every 3 weeks.  First dose 02/12/2020.  Status post 11 cycles.  Starting from cycle #5 she was on maintenance treatment with Alimta and Keytruda every 3 weeks.  Starting cycle #9 she will be on single agent Keytruda every 3 weeks.  Her treatment is currently on hold secondary to significant skin rash.  Last dose was giving December 29, 2020.  CURRENT THERAPY: Observation.  INTERVAL HISTORY: Kayla Sawyer 77 y.o. female returns to the clinic today for follow-up visit accompanied by her husband.  The patient continues to have the baseline shortness of breath and she is currently on home oxygen.  She denied having any current chest pain, cough or hemoptysis.  She has no nausea, vomiting, diarrhea or constipation.  She has no recent weight loss or night sweats.  She has no fever or chills.  She is currently on observation and she had repeat CT scan of the chest, abdomen and pelvis performed recently.  She is here today for evaluation and discussion of her scan results.  MEDICAL  HISTORY: Past Medical History:  Diagnosis Date   Arthritis    COPD (chronic obstructive pulmonary disease) (HCC)    Depression    E. coli pyelonephritis 6//2014   GERD (gastroesophageal reflux disease)    Pneumonia    Pulmonary hypertension (HCC)    Sepsis (HCC)     ALLERGIES:  is allergic to elemental sulfur.  MEDICATIONS:  Current Outpatient Medications  Medication Sig Dispense Refill   acetaminophen (TYLENOL) 500 MG tablet Take 500 mg by mouth every 6 (six) hours as needed for mild pain.     albuterol (PROVENTIL) (2.5 MG/3ML) 0.083% nebulizer solution Take 3 mLs (2.5 mg total) by nebulization every 6 (six) hours as needed for wheezing or shortness of breath. 75 mL 12   aspirin EC 81 MG tablet Take 1 tablet (81 mg total) by mouth daily.     atorvastatin (LIPITOR) 80 MG tablet Take 1 tablet (80 mg total) by mouth daily. 90 tablet 3   fluticasone (CUTIVATE) 0.05 % cream Apply topically.     Fluticasone-Umeclidin-Vilant (TRELEGY ELLIPTA) 100-62.5-25 MCG/ACT AEPB Inhale 1 puff into the lungs daily. 60 each 11   furosemide (LASIX) 20 MG tablet Take 1 tablet (20 mg total) by mouth daily as needed (leg swelling). 30 tablet 3   levocetirizine (XYZAL) 5 MG tablet Take 5 mg by mouth every evening.      lidocaine-prilocaine (EMLA) cream Apply to the Port-A-Cath site 30-60-minute before chemotherapy every 3 weeks. 30 g 0   Multiple Vitamin (MULTIVITAMIN WITH MINERALS) TABS tablet Take 1 tablet by mouth daily.  mupirocin ointment (BACTROBAN) 2 % SMARTSIG:1 Application Topical 2-3 Times Daily     MYRBETRIQ 25 MG TB24 tablet Take 25 mg by mouth daily.     nitroGLYCERIN (NITROSTAT) 0.4 MG SL tablet Place 1 tablet (0.4 mg total) under the tongue every 5 (five) minutes as needed for chest pain. 30 tablet 0   omeprazole (PRILOSEC) 20 MG capsule Take 20 mg by mouth daily.      prochlorperazine (COMPAZINE) 10 MG tablet Take 1 tablet (10 mg total) by mouth every 6 (six) hours as needed for nausea or  vomiting. 30 tablet 0   sertraline (ZOLOFT) 100 MG tablet Take 100 mg by mouth daily.      No current facility-administered medications for this visit.   Facility-Administered Medications Ordered in Other Visits  Medication Dose Route Frequency Provider Last Rate Last Admin   sodium chloride flush (NS) 0.9 % injection 10 mL  10 mL Intravenous PRN Curt Bears, MD        SURGICAL HISTORY:  Past Surgical History:  Procedure Laterality Date   ABDOMINAL HYSTERECTOMY     partial   AORTA - BILATERAL FEMORAL ARTERY BYPASS GRAFT Bilateral 06/14/2016   Procedure: AORTOBIFEMORAL BYPASS GRAFT AORTA SMA BYPASS GRAFT RE-IMPLANTATION  OF SUPERIOR MESENTERIC ARTERY;  Surgeon: Serafina Mitchell, MD;  Location: [place];  Service: Vascular;  Laterality: Bilateral;   BACK SURGERY     L4/5   CARDIAC CATHETERIZATION N/A 01/03/2016   Procedure: Right/Left Heart Cath and Coronary Angiography;  Surgeon: Jolaine Artist, MD;  Location: [place] CV LAB;  Service: Cardiovascular;  Laterality: N/A;   COLONOSCOPY N/A 08/05/2015   Procedure: COLONOSCOPY;  Surgeon: Rogene Houston, MD;  [facility];  Service: Endoscopy;  Laterality: N/A;  730   ELBOW SURGERY     IR IMAGING GUIDED PORT INSERTION  02/16/2020   PERIPHERAL VASCULAR CATHETERIZATION N/A 02/16/2016   Procedure: Abdominal Aortogram w/Lower Extremity;  Surgeon: Wellington Hampshire, MD;  Location: [place] CV LAB;  Service: Cardiovascular;  Laterality: N/A;    REVIEW OF SYSTEMS:  Constitutional: positive for fatigue Eyes: negative Ears, nose, mouth, throat, and face: negative Respiratory: positive for dyspnea on exertion Cardiovascular: negative Gastrointestinal: negative Genitourinary:negative Integument/breast: negative Hematologic/lymphatic: negative Musculoskeletal:positive for muscle weakness Neurological: negative Behavioral/Psych: negative Endocrine: negative Allergic/Immunologic: negative   PHYSICAL EXAMINATION: General  appearance: alert, cooperative, fatigued, and no distress Head: Normocephalic, without obvious abnormality, atraumatic Neck: no adenopathy, no JVD, supple, symmetrical, trachea midline, and thyroid not enlarged, symmetric, no tenderness/mass/nodules Lymph nodes: Cervical, supraclavicular, and axillary nodes normal. Resp: clear to auscultation bilaterally Back: symmetric, no curvature. ROM normal. No CVA tenderness. Cardio: regular rate and rhythm, S1, S2 normal, no murmur, click, rub or gallop GI: soft, non-tender; bowel sounds normal; no masses,  no organomegaly Extremities: extremities normal, atraumatic, no cyanosis or edema Neurologic: Alert and oriented X 3, normal strength and tone. Normal symmetric reflexes. Normal coordination and gait    ECOG PERFORMANCE STATUS: 1 - Symptomatic but completely ambulatory  Blood pressure 115/61, pulse 92, resp. rate 17, weight 132 lb 9 oz (60.1 kg), SpO2 98 %.  LABORATORY DATA: Lab Results  Component Value Date   WBC 7.7 07/31/2022   HGB 11.5 (L) 07/31/2022   HCT 35.0 (L) 07/31/2022   MCV 92.1 07/31/2022   PLT 220 07/31/2022      Chemistry      Component Value Date/Time   NA 143 07/31/2022 1200   K 3.6 07/31/2022 1200   CL  108 07/31/2022 1200   CO2 29 07/31/2022 1200   BUN 36 (H) 07/31/2022 1200   CREATININE 1.05 (H) 07/31/2022 1200   CREATININE 0.90 02/08/2016 0949      Component Value Date/Time   CALCIUM 9.0 07/31/2022 1200   ALKPHOS 47 07/31/2022 1200   AST 15 07/31/2022 1200   ALT 11 07/31/2022 1200   BILITOT 0.4 07/31/2022 1200       RADIOGRAPHIC STUDIES: CT Chest W Contrast  Result Date: 07/31/2022 CLINICAL DATA:  Non-small cell lung cancer, metastatic, assess treatment response. Chemotherapy and radiation therapy complete. Shortness of breath. * Tracking Code: BO * EXAM: CT CHEST, ABDOMEN, AND PELVIS WITH CONTRAST TECHNIQUE: Multidetector CT imaging of the chest, abdomen and pelvis was performed following the standard  protocol during bolus administration of intravenous contrast. RADIATION DOSE REDUCTION: This exam was performed according to the departmental dose-optimization program which includes automated exposure control, adjustment of the mA and/or kV according to patient size and/or use of iterative reconstruction technique. CONTRAST:  160mL OMNIPAQUE IOHEXOL 300 MG/ML  SOLN COMPARISON:  04/04/2022. FINDINGS: CT CHEST FINDINGS Cardiovascular: Right IJ Port-A-Cath tip is at the SVC RA junction. Atherosclerotic calcification of the aorta, aortic valve and coronary arteries. Heart is at the upper limits of normal in size. Moderate to large pericardial effusion, increased. Enlarged pulmonic trunk. Mediastinum/Nodes: No pathologically enlarged mediastinal, hilar or axillary lymph nodes. Esophagus is air-filled, suggesting dysmotility. Lungs/Pleura: Centrilobular emphysema. Post treatment scarring and volume loss in the left upper lobe, unchanged. No pleural fluid. Debris is seen in the airway. Musculoskeletal: Degenerative changes in the spine. No worrisome lytic or sclerotic lesions. CT ABDOMEN PELVIS FINDINGS Hepatobiliary: Liver and gallbladder are unremarkable. No biliary ductal dilatation. Pancreas: Negative. Spleen: Negative. Adrenals/Urinary Tract: Adrenal glands are unremarkable. Renal cortical scarring and parenchymal atrophy bilaterally. Ureters are decompressed. Bladder is grossly unremarkable. Stomach/Bowel: Stomach, small bowel, appendix and colon are unremarkable. Incidental note is made of stool throughout the colon, indicative of constipation. Vascular/Lymphatic: Aortobifemoral bypass graft. Heavily calcified superior mesenteric artery origin. No pathologically enlarged lymph nodes. Reproductive: Hysterectomy.  No adnexal mass. Other: No free fluid.  Mesenteries and peritoneum are unremarkable. Musculoskeletal: Degenerative changes in the spine. No worrisome lytic or sclerotic lesions. IMPRESSION: 1. Post  treatment scarring and volume loss in the left upper lobe. No evidence of recurrent or metastatic disease. 2. Moderate to large pericardial effusion, increased from 04/04/2022. 3. Aortic atherosclerosis (ICD10-I70.0). Coronary artery calcification. Densely calcified superior mesenteric artery origin. 4. Enlarged pulmonic trunk, indicative of pulmonary arterial hypertension. 5.  Emphysema (ICD10-J43.9). Electronically Signed   By: Lorin Picket M.D.   On: 07/31/2022 15:05   CT Abdomen Pelvis W Contrast  Result Date: 07/31/2022 CLINICAL DATA:  Non-small cell lung cancer, metastatic, assess treatment response. Chemotherapy and radiation therapy complete. Shortness of breath. * Tracking Code: BO * EXAM: CT CHEST, ABDOMEN, AND PELVIS WITH CONTRAST TECHNIQUE: Multidetector CT imaging of the chest, abdomen and pelvis was performed following the standard protocol during bolus administration of intravenous contrast. RADIATION DOSE REDUCTION: This exam was performed according to the departmental dose-optimization program which includes automated exposure control, adjustment of the mA and/or kV according to patient size and/or use of iterative reconstruction technique. CONTRAST:  189mL OMNIPAQUE IOHEXOL 300 MG/ML  SOLN COMPARISON:  04/04/2022. FINDINGS: CT CHEST FINDINGS Cardiovascular: Right IJ Port-A-Cath tip is at the SVC RA junction. Atherosclerotic calcification of the aorta, aortic valve and coronary arteries. Heart is at the upper limits of normal in size. Moderate to large  pericardial effusion, increased. Enlarged pulmonic trunk. Mediastinum/Nodes: No pathologically enlarged mediastinal, hilar or axillary lymph nodes. Esophagus is air-filled, suggesting dysmotility. Lungs/Pleura: Centrilobular emphysema. Post treatment scarring and volume loss in the left upper lobe, unchanged. No pleural fluid. Debris is seen in the airway. Musculoskeletal: Degenerative changes in the spine. No worrisome lytic or sclerotic  lesions. CT ABDOMEN PELVIS FINDINGS Hepatobiliary: Liver and gallbladder are unremarkable. No biliary ductal dilatation. Pancreas: Negative. Spleen: Negative. Adrenals/Urinary Tract: Adrenal glands are unremarkable. Renal cortical scarring and parenchymal atrophy bilaterally. Ureters are decompressed. Bladder is grossly unremarkable. Stomach/Bowel: Stomach, small bowel, appendix and colon are unremarkable. Incidental note is made of stool throughout the colon, indicative of constipation. Vascular/Lymphatic: Aortobifemoral bypass graft. Heavily calcified superior mesenteric artery origin. No pathologically enlarged lymph nodes. Reproductive: Hysterectomy.  No adnexal mass. Other: No free fluid.  Mesenteries and peritoneum are unremarkable. Musculoskeletal: Degenerative changes in the spine. No worrisome lytic or sclerotic lesions. IMPRESSION: 1. Post treatment scarring and volume loss in the left upper lobe. No evidence of recurrent or metastatic disease. 2. Moderate to large pericardial effusion, increased from 04/04/2022. 3. Aortic atherosclerosis (ICD10-I70.0). Coronary artery calcification. Densely calcified superior mesenteric artery origin. 4. Enlarged pulmonic trunk, indicative of pulmonary arterial hypertension. 5.  Emphysema (ICD10-J43.9). Electronically Signed   By: Leanna Battles M.D.   On: 07/31/2022 15:05     ASSESSMENT AND PLAN: This is a very pleasant 77 years old white female with a stage IV (T3, N2, M1a) non-small cell lung cancer, poorly differentiated adenocarcinoma diagnosed in June 2021 and presented with large left upper lobe lung mass with mediastinal invasion in addition to mediastinal lymphadenopathy as well as bilateral pulmonary nodules. The patient has no actionable mutations but PD-L1 expression is 100%. Because of the bulky disease I recommended for the patient to consider the combination chemotherapy.  She is interested in this option. She is currently being treated with  carboplatin for AUC of 5, Alimta 500 mg/M2 and Keytruda 200 mg IV every 3 weeks status post 11 cycles.  Starting from cycle #5 she is on maintenance treatment with Alimta and Keytruda every 3 weeks.  Starting from cycle #9 she is on treatment with single agent Keytruda.  The patient had a break off treatment for few months after cycle #9 but she had some evidence for disease progression and she resumed her treatment. The patient tolerated the last cycle of her treatment well except for the recurrence of skin rash and itching again.  She decided not to proceed with any further treatment with immunotherapy. The patient is currently on observation for the last 19 months. The patient has been doing fine except for the baseline shortness of breath secondary to COPD. She had repeat CT scan of the chest, abdomen and pelvis performed recently.  I personally and independently reviewed the scan and discussed the result with the patient today. Her scan showed no concerning findings for disease recurrence or metastasis but there was few new small pulmonary nodules that need close monitoring on the upcoming imaging studies. For the history of COPD, she will continue her care and follow-up by pulmonary medicine.  The patient voices understanding of current disease status and treatment options and is in agreement with the current care plan. All questions were answered. The patient knows to call the clinic with any problems, questions or concerns. We can certainly see the patient much sooner if necessary. The total time spent in the appointment was 30 minutes.  Disclaimer: This note was dictated with  voice recognition software. Similar sounding words can inadvertently be transcribed and may not be corrected upon review.

## 2022-12-04 DIAGNOSIS — J449 Chronic obstructive pulmonary disease, unspecified: Secondary | ICD-10-CM | POA: Diagnosis not present

## 2022-12-16 ENCOUNTER — Emergency Department (HOSPITAL_COMMUNITY): Payer: Medicare Other

## 2022-12-16 ENCOUNTER — Other Ambulatory Visit: Payer: Self-pay

## 2022-12-16 ENCOUNTER — Emergency Department (HOSPITAL_COMMUNITY)
Admission: EM | Admit: 2022-12-16 | Discharge: 2022-12-17 | Disposition: A | Discharge status: Home / Self Care | Payer: Medicare Other | Attending: Emergency Medicine | Admitting: Emergency Medicine

## 2022-12-16 ENCOUNTER — Encounter (HOSPITAL_COMMUNITY): Payer: Self-pay | Admitting: Emergency Medicine

## 2022-12-16 DIAGNOSIS — Z7982 Long term (current) use of aspirin: Secondary | ICD-10-CM | POA: Diagnosis not present

## 2022-12-16 DIAGNOSIS — F17200 Nicotine dependence, unspecified, uncomplicated: Secondary | ICD-10-CM | POA: Diagnosis not present

## 2022-12-16 DIAGNOSIS — R531 Weakness: Secondary | ICD-10-CM | POA: Diagnosis not present

## 2022-12-16 DIAGNOSIS — J449 Chronic obstructive pulmonary disease, unspecified: Secondary | ICD-10-CM | POA: Insufficient documentation

## 2022-12-16 DIAGNOSIS — C3492 Malignant neoplasm of unspecified part of left bronchus or lung: Secondary | ICD-10-CM | POA: Diagnosis not present

## 2022-12-16 DIAGNOSIS — C3490 Malignant neoplasm of unspecified part of unspecified bronchus or lung: Secondary | ICD-10-CM | POA: Diagnosis not present

## 2022-12-16 DIAGNOSIS — R0602 Shortness of breath: Secondary | ICD-10-CM | POA: Diagnosis not present

## 2022-12-16 DIAGNOSIS — Z7951 Long term (current) use of inhaled steroids: Secondary | ICD-10-CM | POA: Diagnosis not present

## 2022-12-16 LAB — CBC
HCT: 33.9 % — ABNORMAL LOW (ref 36.0–46.0)
Hemoglobin: 10.6 g/dL — ABNORMAL LOW (ref 12.0–15.0)
MCH: 29.4 pg (ref 26.0–34.0)
MCHC: 31.3 g/dL (ref 30.0–36.0)
MCV: 94.2 fL (ref 80.0–100.0)
Platelets: 365 10*3/uL (ref 150–400)
RBC: 3.6 MIL/uL — ABNORMAL LOW (ref 3.87–5.11)
RDW: 14.5 % (ref 11.5–15.5)
WBC: 11.8 10*3/uL — ABNORMAL HIGH (ref 4.0–10.5)
nRBC: 0 % (ref 0.0–0.2)

## 2022-12-16 LAB — URINALYSIS, ROUTINE W REFLEX MICROSCOPIC
Bilirubin Urine: NEGATIVE
Glucose, UA: NEGATIVE mg/dL
Hgb urine dipstick: NEGATIVE
Ketones, ur: NEGATIVE mg/dL
Nitrite: NEGATIVE
Protein, ur: NEGATIVE mg/dL
Specific Gravity, Urine: 1.019 (ref 1.005–1.030)
pH: 5 (ref 5.0–8.0)

## 2022-12-16 LAB — BASIC METABOLIC PANEL
Anion gap: 11 (ref 5–15)
BUN: 29 mg/dL — ABNORMAL HIGH (ref 8–23)
CO2: 24 mmol/L (ref 22–32)
Calcium: 8.7 mg/dL — ABNORMAL LOW (ref 8.9–10.3)
Chloride: 104 mmol/L (ref 98–111)
Creatinine, Ser: 1.33 mg/dL — ABNORMAL HIGH (ref 0.44–1.00)
GFR, Estimated: 41 mL/min — ABNORMAL LOW (ref >60)
Glucose, Bld: 134 mg/dL — ABNORMAL HIGH (ref 70–99)
Potassium: 4.2 mmol/L (ref 3.5–5.1)
Sodium: 139 mmol/L (ref 135–145)

## 2022-12-16 LAB — CBG MONITORING, ED: Glucose-Capillary: 136 mg/dL — ABNORMAL HIGH (ref 70–99)

## 2022-12-16 MED ORDER — IOHEXOL 350 MG/ML SOLN
75.0000 mL | Freq: Once | INTRAVENOUS | Status: AC | PRN
  Administered 2022-12-17: 75 mL via INTRAVENOUS

## 2022-12-16 MED ORDER — SODIUM CHLORIDE 0.9 % IV SOLN: INTRAVENOUS | Status: DC

## 2022-12-16 MED ORDER — ALBUTEROL SULFATE HFA 108 (90 BASE) MCG/ACT IN AERS: 2.0000 | INHALATION_SPRAY | RESPIRATORY_TRACT | Status: DC | PRN

## 2022-12-16 NOTE — ED Triage Notes (Signed)
End stage lung cancer pt presenting with increased SOB and weakness x 1 week with intermittent shooting pains up left side of neck and below right lower rib cage. 4L baseline O2 requirement. Decreased breath sounds throughout

## 2022-12-16 NOTE — ED Provider Notes (Signed)
[place] EMERGENCY DEPARTMENT AT [facility] Provider Note   CSN: [id number] Arrival date & time: 12/16/22  1957     History  Chief Complaint  Patient presents with   Shortness of Breath   Weakness    Kayla Sawyer is a 77 y.o. female.  Duplicate chart.       Home Medications Prior to Admission medications   Medication Sig Start Date End Date Taking? Authorizing Provider  acetaminophen (TYLENOL) 650 MG CR tablet Take 650-1,300 mg by mouth every 8 (eight) hours as needed for pain.    [provider]  albuterol (PROVENTIL) (2.5 MG/3ML) 0.083% nebulizer solution Take 3 mLs (2.5 mg total) by nebulization every 6 (six) hours as needed for wheezing or shortness of breath. 08/29/21   Oretha Milch, MD  aspirin EC 81 MG tablet Take 1 tablet (81 mg total) by mouth daily. 02/08/16   Iran Ouch, MD  atorvastatin (LIPITOR) 80 MG tablet Take 1 tablet (80 mg total) by mouth daily. 03/30/22 03/25/23  Christell Constant, MD  Fluticasone-Umeclidin-Vilant (TRELEGY ELLIPTA) 100-62.5-25 MCG/ACT AEPB Inhale 1 puff into the lungs daily. 06/26/22   Oretha Milch, MD  levocetirizine (XYZAL) 5 MG tablet Take 5 mg by mouth in the morning. 08/31/16   [provider]  Multiple Vitamin (MULTIVITAMIN WITH MINERALS) TABS tablet Take 1 tablet by mouth in the morning.    [provider]  MYRBETRIQ 25 MG TB24 tablet Take 25 mg by mouth in the morning. 02/21/21   [provider]  nitroGLYCERIN (NITROSTAT) 0.4 MG SL tablet Place 1 tablet (0.4 mg total) under the tongue every 5 (five) minutes as needed for chest pain. 09/14/21   Christell Constant, MD  omeprazole (PRILOSEC) 20 MG capsule Take 20 mg by mouth daily before breakfast. 05/13/15   [provider]  OXYGEN Inhale 4 L into the lungs continuous.    [provider]  prochlorperazine (COMPAZINE) 10 MG tablet Take 1 tablet (10 mg total) by mouth every 6 (six) hours as needed for nausea or  vomiting. 02/05/20   Si Gaul, MD  sertraline (ZOLOFT) 100 MG tablet Take 100 mg by mouth daily.  04/19/16   [provider]      Allergies    Elemental sulfur    Review of Systems   Review of Systems  Physical Exam Updated Vital Signs BP 115/71 (BP Location: Left Arm)   Pulse (!) 113   Temp 97.6 F (36.4 C) (Oral)   Resp (!) 24   Ht 1.702 m (5\' 7" )   Wt 59 kg   SpO2 92%   BMI 20.36 kg/m  Physical Exam  ED Results / Procedures / Treatments   Labs (all labs ordered are listed, but only abnormal results are displayed) Labs Reviewed  BASIC METABOLIC PANEL - Abnormal; Notable for the following components:      Result Value   Glucose, Bld 134 (*)    BUN 29 (*)    Creatinine, Ser 1.33 (*)    Calcium 8.7 (*)    GFR, Estimated 41 (*)    All other components within normal limits  CBC - Abnormal; Notable for the following components:   WBC 11.8 (*)    RBC 3.60 (*)    Hemoglobin 10.6 (*)    HCT 33.9 (*)    All other components within normal limits  URINALYSIS, ROUTINE W REFLEX MICROSCOPIC - Abnormal; Notable for the following components:   Leukocytes,Ua SMALL (*)  Bacteria, UA RARE (*)    All other components within normal limits  CBG MONITORING, ED - Abnormal; Notable for the following components:   Glucose-Capillary 136 (*)    All other components within normal limits    EKG EKG Interpretation  Date/Time:  Saturday December 16 2022 20:17:09 EDT Ventricular Rate:  102 PR Interval:  171 QRS Duration: 83 QT Interval:  364 QTC Calculation: 475 R Axis:   107 Text Interpretation: Sinus tachycardia Low voltage, extremity and precordial leads Confirmed by Vanetta Mulders [phone]) on 12/16/2022 9:33:25 PM  Radiology DG Chest 2 View  Result Date: 12/16/2022 CLINICAL DATA:  Shortness of breath.  Difficulty hearing. EXAM: CHEST - 2 VIEW COMPARISON:  CT 11/03/2022 FINDINGS: Power port type central venous catheter with tip over the cavoatrial junction region.  Volume loss and scarring in the left lung similar to prior study, likely postoperative. Right lung is expanded and clear. No pleural effusions. No pneumothorax. Calcification of the aorta. IMPRESSION: Volume loss and scarring in the left lung likely postoperative. No focal consolidation. Electronically Signed   By: Burman Nieves M.D.   On: 12/16/2022 20:59    Procedures Procedures    Medications Ordered in ED Medications  albuterol (VENTOLIN HFA) 108 (90 Base) MCG/ACT inhaler 2 puff (has no administration in time range)  0.9 %  sodium chloride infusion (0 mLs Intravenous Stopped 12/16/22 2231)  iohexol (OMNIPAQUE) 350 MG/ML injection 75 mL (has no administration in time range)    ED Course/ Medical Decision Making/ A&P                             Medical Decision Making Amount and/or Complexity of Data Reviewed Labs: ordered. Radiology: ordered.  Risk Prescription drug management.   Duplicate chart. Final Clinical Impression(s) / ED Diagnoses Final diagnoses:  Non-small cell cancer of left lung (HCC)  Shortness of breath    Rx / DC Orders ED Discharge Orders     None         Vanetta Mulders, MD 12/16/22 2328

## 2022-12-16 NOTE — ED Provider Notes (Signed)
[place] EMERGENCY DEPARTMENT AT [facility] Provider Note   CSN: [id number] Arrival date & time: 12/16/22  1957     History  Chief Complaint  Patient presents with   Shortness of Breath   Weakness    Kayla Sawyer is a 77 y.o. female.  Patient followed by Dr. Shirline Frees at [facility] cancer center at [facility] long.  Patient stage IV non-small cell lung cancer poorly differentiated adenocarcinoma with large left upper lobe lung mass in addition to mediastinal lymphadenopathy and bilateral bilateral pulmonary nodules first diagnosed in June 2021.  Patient last seen by him on May 23.  They had tried immunotherapy with Keytruda but patient had a reaction.  Patient not receiving any further treatment.  For the past week patient has had increased shortness of breath.  Patient not taking p.o. very well.  Patient's daughter works in the [employer] system.  She was away on vacation came back and noticed that she really been struggling family members have been increasing her oxygen levels all the way up to 5 L at times currently at 4 L.  Oxygen sats 92% little tachycardic heart rate 113 respirations 24 temp 97.6.  Blood pressure 115/71.  Past medical history significant for history of COPD no wheezing currently the history of the metastatic lung CA.  Patient is an everyday smoker.  Patient last seen in the emergency department in February 2023 for chest pressure.  Otherwise is followed at the cancer center and last saw [facility] April 23.       Home Medications Prior to Admission medications   Medication Sig Start Date End Date Taking? Authorizing Provider  acetaminophen (TYLENOL) 650 MG CR tablet Take 650-1,300 mg by mouth every 8 (eight) hours as needed for pain.    [provider]  albuterol (PROVENTIL) (2.5 MG/3ML) 0.083% nebulizer solution Take 3 mLs (2.5 mg total) by nebulization every 6 (six) hours as needed for wheezing or shortness of breath. 08/29/21   Oretha Milch, MD  aspirin EC 81  MG tablet Take 1 tablet (81 mg total) by mouth daily. 02/08/16   Iran Ouch, MD  atorvastatin (LIPITOR) 80 MG tablet Take 1 tablet (80 mg total) by mouth daily. 03/30/22 03/25/23  Christell Constant, MD  Fluticasone-Umeclidin-Vilant (TRELEGY ELLIPTA) 100-62.5-25 MCG/ACT AEPB Inhale 1 puff into the lungs daily. 06/26/22   Oretha Milch, MD  levocetirizine (XYZAL) 5 MG tablet Take 5 mg by mouth in the morning. 08/31/16   [provider]  Multiple Vitamin (MULTIVITAMIN WITH MINERALS) TABS tablet Take 1 tablet by mouth in the morning.    [provider]  MYRBETRIQ 25 MG TB24 tablet Take 25 mg by mouth in the morning. 02/21/21   [provider]  nitroGLYCERIN (NITROSTAT) 0.4 MG SL tablet Place 1 tablet (0.4 mg total) under the tongue every 5 (five) minutes as needed for chest pain. 09/14/21   Christell Constant, MD  omeprazole (PRILOSEC) 20 MG capsule Take 20 mg by mouth daily before breakfast. 05/13/15   [provider]  OXYGEN Inhale 4 L into the lungs continuous.    [provider]  prochlorperazine (COMPAZINE) 10 MG tablet Take 1 tablet (10 mg total) by mouth every 6 (six) hours as needed for nausea or vomiting. 02/05/20   Si Gaul, MD  sertraline (ZOLOFT) 100 MG tablet Take 100 mg by mouth daily.  04/19/16   [provider]      Allergies    Elemental sulfur  Review of Systems   Review of Systems  Constitutional:  Positive for activity change and appetite change. Negative for chills and fever.  HENT:  Negative for ear pain and sore throat.   Eyes:  Negative for pain and visual disturbance.  Respiratory:  Positive for shortness of breath. Negative for cough.   Cardiovascular:  Negative for chest pain and palpitations.  Gastrointestinal:  Negative for abdominal pain and vomiting.  Genitourinary:  Negative for dysuria and hematuria.  Musculoskeletal:  Negative for arthralgias and back pain.  Skin:  Negative for color change  and rash.  Neurological:  Positive for weakness. Negative for seizures and syncope.  All other systems reviewed and are negative.   Physical Exam Updated Vital Signs BP 115/71 (BP Location: Left Arm)   Pulse (!) 113   Temp 97.6 F (36.4 C) (Oral)   Resp (!) 24   Ht 1.702 m (5\' 7" )   Wt 59 kg   SpO2 92%   BMI 20.36 kg/m  Physical Exam Vitals and nursing note reviewed.  Constitutional:      General: She is not in acute distress.    Appearance: Normal appearance. She is well-developed. She is ill-appearing.  HENT:     Head: Normocephalic and atraumatic.     Mouth/Throat:     Mouth: Mucous membranes are dry.  Eyes:     Conjunctiva/sclera: Conjunctivae normal.  Cardiovascular:     Rate and Rhythm: Normal rate and regular rhythm.     Heart sounds: No murmur heard. Pulmonary:     Effort: Respiratory distress present.     Breath sounds: Normal breath sounds. No stridor. No wheezing, rhonchi or rales.  Abdominal:     Palpations: Abdomen is soft.     Tenderness: There is no abdominal tenderness.  Musculoskeletal:        General: No swelling.     Cervical back: Neck supple.     Right lower leg: No edema.     Left lower leg: No edema.  Skin:    General: Skin is warm and dry.     Capillary Refill: Capillary refill takes less than 2 seconds.  Neurological:     General: No focal deficit present.     Mental Status: She is alert and oriented to person, place, and time.  Psychiatric:        Mood and Affect: Mood normal.     ED Results / Procedures / Treatments   Labs (all labs ordered are listed, but only abnormal results are displayed) Labs Reviewed  BASIC METABOLIC PANEL - Abnormal; Notable for the following components:      Result Value   Glucose, Bld 134 (*)    BUN 29 (*)    Creatinine, Ser 1.33 (*)    Calcium 8.7 (*)    GFR, Estimated 41 (*)    All other components within normal limits  CBC - Abnormal; Notable for the following components:   WBC 11.8 (*)    RBC  3.60 (*)    Hemoglobin 10.6 (*)    HCT 33.9 (*)    All other components within normal limits  URINALYSIS, ROUTINE W REFLEX MICROSCOPIC - Abnormal; Notable for the following components:   Leukocytes,Ua SMALL (*)    Bacteria, UA RARE (*)    All other components within normal limits  CBG MONITORING, ED - Abnormal; Notable for the following components:   Glucose-Capillary 136 (*)    All other components within normal limits    EKG EKG  Interpretation  Date/Time:  Saturday December 16 2022 20:17:09 EDT Ventricular Rate:  102 PR Interval:  171 QRS Duration: 83 QT Interval:  364 QTC Calculation: 475 R Axis:   107 Text Interpretation: Sinus tachycardia Low voltage, extremity and precordial leads Confirmed by Vanetta Mulders [id number]) on 12/16/2022 9:33:25 PM  Radiology DG Chest 2 View  Result Date: 12/16/2022 CLINICAL DATA:  Shortness of breath.  Difficulty hearing. EXAM: CHEST - 2 VIEW COMPARISON:  CT 11/03/2022 FINDINGS: Power port type central venous catheter with tip over the cavoatrial junction region. Volume loss and scarring in the left lung similar to prior study, likely postoperative. Right lung is expanded and clear. No pleural effusions. No pneumothorax. Calcification of the aorta. IMPRESSION: Volume loss and scarring in the left lung likely postoperative. No focal consolidation. Electronically Signed   By: Burman Nieves M.D.   On: 12/16/2022 20:59    Procedures Procedures    Medications Ordered in ED Medications  albuterol (VENTOLIN HFA) 108 (90 Base) MCG/ACT inhaler 2 puff (has no administration in time range)  0.9 %  sodium chloride infusion (has no administration in time range)    ED Course/ Medical Decision Making/ A&P                             Medical Decision Making Amount and/or Complexity of Data Reviewed Labs: ordered. Radiology: ordered.  Risk Prescription drug management.   Patient tachycardic increased oxygen requirement some weakness.  Patient with  known history of metastatic adenocarcinoma of the lung currently receiving no treatment followed by Dr. Shirline Frees.  Patient did not tolerate immunotherapy.  Initial diagnosis was in 2021.  Large left-sided mass.  Clinical concern here with may be for pulmonary embolus.  Chest x-ray done today does not really show any acute changes.  Patient's GFR is 41 so barely adequate for CT angio will ship patient down to [facility] long ED to get a CT angio.  If it shows pulmonary embolism then should be admitted there.    Urinalysis here today had 21-50 whites rare bacteria.  Will send for culture will not treat at this time.  Basic metabolic panel electrolytes normal potassium 4.2.  GFR is mention 41 calcium actually a little low at 8.7 BUN and creatinine BUN 29 creatinine 1.33 so renal function is little bit worse than usual could be a little bit of dehydration.  Will give patient IV fluids at 100 cc an hour to hydrate.  CBC white count 11.8 hemoglobin 10.6 platelets good at 365 and is mention chest x-ray has no acute changes.   Final Clinical Impression(s) / ED Diagnoses Final diagnoses:  Non-small cell cancer of left lung (HCC)  Shortness of breath    Rx / DC Orders ED Discharge Orders     None         Vanetta Mulders, MD 12/16/22 2201

## 2022-12-17 ENCOUNTER — Emergency Department (HOSPITAL_COMMUNITY): Payer: Medicare Other

## 2022-12-17 DIAGNOSIS — R531 Weakness: Secondary | ICD-10-CM | POA: Diagnosis not present

## 2022-12-17 DIAGNOSIS — R0602 Shortness of breath: Secondary | ICD-10-CM | POA: Diagnosis not present

## 2022-12-17 NOTE — Discharge Instructions (Signed)
Your evaluation in the emergency department was generally stable and there is no evidence of blood clot on your chest CT.  We recommend continued follow-up with your primary care doctor as well as your oncologist.  Return for new or concerning symptoms.

## 2022-12-17 NOTE — ED Provider Notes (Signed)
1:51 AM Care assumed in transfer.  Patient sent from [facility] for CTA of the chest to evaluate for pulmonary embolus.  Presenting for shortness of breath with history of known stage IV non-small cell lung cancer; has stopped all further treatment.  When obtaining recap of history from daughter, she states she was away on vacation for the past 2 weeks and returned home to find that her mother was on supplemental oxygen at 7 L.  She is chronically on 4 L supplemental oxygen via nasal cannula.  Patient was experiencing intermittent complaints of shortness of breath at which time the family continue to increase patient's oxygen supply.  The patient has been titrated back down to her chronic 4 L and she is presently satting at 96 to 99% at rest.  She has no complaints of shortness of breath at present.  I have reviewed and interpreted the patient's chest CT.  There is no evidence of pulmonary embolus.  Lung cancer is fairly stable as is pericardial effusion.  There is imaging evidence to suggest pulmonary hypertension.  On further chart review, she does have documented history of PAH.  There is some advanced bronchial thickening associated with her emphysema.  Discussed with patient and family that her shortness of breath may be more related to her COPD than her cancer.  I have considered inpatient hospitalization, but have had a lengthy discussion with the patient and her daughter who expressed comfort with discharge and outpatient follow-up.  Daughter works as a Press photographer in the ICU at [employer].  She feels well equipped to manage her mother's symptoms at home, especially since she is back down to her chronic oxygen requirement without increased demand.  Patient is comfortable with this plan as well.  I have provided strict return precautions, discussed reasons to return to the ED.  Patient discharged in stable condition with no unaddressed concerns.   Results for orders placed or performed during the  hospital encounter of 12/16/22  Basic metabolic panel  Result Value Ref Range   Sodium 139 135 - 145 mmol/L   Potassium 4.2 3.5 - 5.1 mmol/L   Chloride 104 98 - 111 mmol/L   CO2 24 22 - 32 mmol/L   Glucose, Bld 134 (H) 70 - 99 mg/dL   BUN 29 (H) 8 - 23 mg/dL   Creatinine, Ser 4.03 (H) 0.44 - 1.00 mg/dL   Calcium 8.7 (L) 8.9 - 10.3 mg/dL   GFR, Estimated 41 (L) >60 mL/min   Anion gap 11 5 - 15  CBC  Result Value Ref Range   WBC 11.8 (H) 4.0 - 10.5 K/uL   RBC 3.60 (L) 3.87 - 5.11 MIL/uL   Hemoglobin 10.6 (L) 12.0 - 15.0 g/dL   HCT 47.4 (L) 25.9 - 56.3 %   MCV 94.2 80.0 - 100.0 fL   MCH 29.4 26.0 - 34.0 pg   MCHC 31.3 30.0 - 36.0 g/dL   RDW 87.5 64.3 - 32.9 %   Platelets 365 150 - 400 K/uL   nRBC 0.0 0.0 - 0.2 %  Urinalysis, Routine w reflex microscopic -Urine, Clean Catch  Result Value Ref Range   Color, Urine YELLOW YELLOW   APPearance CLEAR CLEAR   Specific Gravity, Urine 1.019 1.005 - 1.030   pH 5.0 5.0 - 8.0   Glucose, UA NEGATIVE NEGATIVE mg/dL   Hgb urine dipstick NEGATIVE NEGATIVE   Bilirubin Urine NEGATIVE NEGATIVE   Ketones, ur NEGATIVE NEGATIVE mg/dL   Protein, ur NEGATIVE NEGATIVE  mg/dL   Nitrite NEGATIVE NEGATIVE   Leukocytes,Ua SMALL (A) NEGATIVE   RBC / HPF 0-5 0 - 5 RBC/hpf   WBC, UA 21-50 0 - 5 WBC/hpf   Bacteria, UA RARE (A) NONE SEEN   Squamous Epithelial / HPF 0-5 0 - 5 /HPF  CBG monitoring, ED  Result Value Ref Range   Glucose-Capillary 136 (H) 70 - 99 mg/dL   CT Angio Chest PE W/Cm &/Or Wo Cm  Result Date: 12/17/2022 CLINICAL DATA:  Weakness and shortness of breath. Radiologic records indicates history of non-small cell lung cancer. EXAM: CT ANGIOGRAPHY CHEST WITH CONTRAST TECHNIQUE: Multidetector CT imaging of the chest was performed using the standard protocol during bolus administration of intravenous contrast. Multiplanar CT image reconstructions and MIPs were obtained to evaluate the vascular anatomy. RADIATION DOSE REDUCTION: This exam was  performed according to the departmental dose-optimization program which includes automated exposure control, adjustment of the mA and/or kV according to patient size and/or use of iterative reconstruction technique. CONTRAST:  75mL OMNIPAQUE IOHEXOL 350 MG/ML SOLN COMPARISON:  Radiograph yesterday. Most recent chest CT 11/03/2022. FINDINGS: Cardiovascular: There are no filling defects within the pulmonary arteries to suggest pulmonary embolus. Dilated main pulmonary artery at 3.3 cm. Advanced aortic atherosclerosis without aneurysm or acute aortic findings. This includes irregular ulcerated plaque in the descending aorta. Upper normal heart size. Small-moderate pericardial effusion is unchanged from prior exam. Mild contrast refluxes into the hepatic veins and IVC. Coronary artery calcifications. Right chest port in place. No definite new pulmonary nodule. Mediastinum/Nodes: No mediastinal adenopathy. The esophagus is patulous but no wall thickening. Stable left perihilar opacity corresponding to post treatment related change. Lungs/Pleura: Advanced emphysema. Volume loss and post treatment related change in the anterior left upper lobe, grossly stable in appearance from prior CT. No pleural effusion. There is no acute airspace disease. Minimal retained mucus in the dependent trachea. Slight increase in bronchial thickening from prior exam. Nodule in the superior segment of the left lower lobe measures 9 x 7 mm, series 12, image 52, previously 8 x 4 mm. Right upper lobe pulmonary nodule measures 2 mm series 12, image 62, previously 5 mm. The previous 4 mm right upper lobe nodule has resolved in the interim. 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 Postsurgical change of the abdominal aorta which is only partially included in the field of view. No acute abnormality. Musculoskeletal: No focal bone lesion or bony  destructive change no acute osseous findings. No chest wall soft tissue abnormalities. Review of the MIP images confirms the above findings. IMPRESSION: 1. No pulmonary embolus. 2. Mild contrast refluxes into the hepatic veins and IVC, suggesting elevated right heart pressures. Unchanged small-moderate pericardial effusion. 3. Unchanged post treatment related change in the anterior left upper lobe. Nodule in the superior segment of the left lower lobe has increased in size, suspicious for metastasis or second primary. Pulmonary nodules in the right lung that were new on prior exam have decreased in size or resolved. Continued attention at follow-up recommended. 4. Advanced emphysema. There is increased bronchial thickening from prior exam. 5. Advanced aortic atherosclerosis including irregular plaque in the descending aorta, no acute aortic findings. Coronary artery calcifications. Aortic Atherosclerosis (ICD10-I70.0) and Emphysema (ICD10-J43.9). Electronically Signed   By: Narda Rutherford M.D.   On: 12/17/2022 00:58   DG Chest 2 View  Result Date: 12/16/2022 CLINICAL DATA:  Shortness of breath.  Difficulty hearing. EXAM: CHEST -  2 VIEW COMPARISON:  CT 11/03/2022 FINDINGS: Power port type central venous catheter with tip over the cavoatrial junction region. Volume loss and scarring in the left lung similar to prior study, likely postoperative. Right lung is expanded and clear. No pleural effusions. No pneumothorax. Calcification of the aorta. IMPRESSION: Volume loss and scarring in the left lung likely postoperative. No focal consolidation. Electronically Signed   By: Burman Nieves M.D.   On: 12/16/2022 20:59      Antony Madura, PA-C 12/17/22 2440    Shon Baton, MD 12/18/22 [phone]

## 2022-12-21 DIAGNOSIS — D6481 Anemia due to antineoplastic chemotherapy: Secondary | ICD-10-CM | POA: Diagnosis not present

## 2022-12-21 DIAGNOSIS — F17219 Nicotine dependence, cigarettes, with unspecified nicotine-induced disorders: Secondary | ICD-10-CM | POA: Diagnosis not present

## 2022-12-21 DIAGNOSIS — K219 Gastro-esophageal reflux disease without esophagitis: Secondary | ICD-10-CM | POA: Diagnosis not present

## 2022-12-21 DIAGNOSIS — J439 Emphysema, unspecified: Secondary | ICD-10-CM | POA: Diagnosis not present

## 2022-12-21 DIAGNOSIS — C3492 Malignant neoplasm of unspecified part of left bronchus or lung: Secondary | ICD-10-CM | POA: Diagnosis not present

## 2022-12-21 DIAGNOSIS — N1832 Chronic kidney disease, stage 3b: Secondary | ICD-10-CM | POA: Diagnosis not present

## 2022-12-21 DIAGNOSIS — J9611 Chronic respiratory failure with hypoxia: Secondary | ICD-10-CM | POA: Diagnosis not present

## 2022-12-21 DIAGNOSIS — I739 Peripheral vascular disease, unspecified: Secondary | ICD-10-CM | POA: Diagnosis not present

## 2022-12-21 DIAGNOSIS — I3139 Other pericardial effusion (noninflammatory): Secondary | ICD-10-CM | POA: Diagnosis not present

## 2022-12-21 DIAGNOSIS — J449 Chronic obstructive pulmonary disease, unspecified: Secondary | ICD-10-CM | POA: Diagnosis not present

## 2022-12-21 DIAGNOSIS — J441 Chronic obstructive pulmonary disease with (acute) exacerbation: Secondary | ICD-10-CM | POA: Diagnosis not present

## 2022-12-22 ENCOUNTER — Telehealth: Payer: Self-pay | Admitting: *Deleted

## 2022-12-22 NOTE — Progress Notes (Signed)
  Care Coordination  Outreach Note  12/22/2022 Name: Kayla Sawyer MRN: [id number] DOB: [birth year]   Care Coordination Outreach Attempts: An unsuccessful telephone outreach was attempted today to offer the patient information about available care coordination services.  Follow Up Plan:  Additional outreach attempts will be made to offer the patient care coordination information and services.   Encounter Outcome:  No Answer  Christie Nottingham  Care Coordination Care Guide  Direct Dial: [phone]

## 2022-12-22 NOTE — Progress Notes (Signed)
  Care Coordination   Note   12/22/2022 Name: Kayla Sawyer MRN: [id number] DOB: June 22, 1946  Kayla Sawyer is a 77 y.o. year old female who sees Margo Aye, Kathleene Hazel, MD for primary care. I reached out to [facility] by phone today to offer care coordination services.  Ms. Skipper was given information about Care Coordination services today including:   The Care Coordination services include support from the care team which includes your Nurse Coordinator, Clinical Social Worker, or Pharmacist.  The Care Coordination team is here to help remove barriers to the health concerns and goals most important to you. Care Coordination services are voluntary, and the patient may decline or stop services at any time by request to their care team member.   Care Coordination Consent Status: Patient agreed to services and verbal consent obtained.   Follow up plan:  Telephone appointment with care coordination team member scheduled for:  12/26/22  Encounter Outcome:  Pt. Scheduled  [facility] Coordination Care Guide  Direct Dial: [phone]

## 2022-12-26 ENCOUNTER — Ambulatory Visit: Payer: Self-pay | Admitting: Licensed Clinical Social Worker

## 2022-12-26 NOTE — Patient Outreach (Signed)
  Care Coordination   Initial Visit Note   12/26/2022 Name: Kayla Sawyer MRN: [id number] DOB: 1946-03-17  Kayla Sawyer is a 77 y.o. year old female who sees Margo Aye, Kathleene Hazel, MD for primary care. I spoke with  Blythe Stanford / Bronson Curb, relative and contact of client via phone today.  What matters to the patients health and wellness today? Patient was admitted to Hospice services  yesterday.  Patient will receive Hospice services in the home environment    Goals Addressed             This Visit's Progress    Patient was admitted to Hospice services yesterday. Patient will receive Hospice services in home environment       Interventions: LCSW spoke via phone with Bronson Curb, relative and contact for client, about client needs Discussed program support Bronson Curb said client was admitted yesterday to [facility]. Client will remain at her home and will receive Hospice services in the home environment Discussed Hospice support  for client with [facility]. Wilkie Aye said that Hospice had sent out oxygen equipment for client today.  She said she is waiting on call from Chaplain and Nurse from [facility] Client has family support.   Discussed pain issues of client. Wilkie Aye said pain issues were manageable for client; but she said client breathing is challenging. She said client has been on oxygen at 4 liters for the past year.   Informed Wilkie Aye that Hospice will be providing care for client moving forward. Discussed DME support of Hospice. Discussed in home care support through Hospice.   Ardelia Mems for phone call today with LCSW . Encouraged client and family to work with Hospice agency as they provide in home care for client . Bronson Curb was appreciative of call from LCSW today        SDOH assessments and interventions completed:  Yes  SDOH Interventions Today    Flowsheet Row Most Recent Value  SDOH Interventions   Physical Activity Interventions Other (Comments)  [has to  take rest breaks due to fatigue]  Stress Interventions Other (Comment)  [has stress related to managing medical needs]        Care Coordination Interventions:  Yes, provided   Interventions Today    Flowsheet Row Most Recent Value  Chronic Disease   Chronic disease during today's visit Other  [spoke via phone with Bronson Curb, relative of client, about client needs]  General Interventions   General Interventions Discussed/Reviewed General Interventions Discussed, [employer]  [discussed program support]  Exercise Interventions   Exercise Discussed/Reviewed Physical Activity  [fatigues occasionally]  Physical Activity Discussed/Reviewed Physical Activity Discussed  Education Interventions   Education Provided Provided Education  Provided Verbal Education On Community Resources  Mental Health Interventions   Mental Health Discussed/Reviewed Anxiety, Coping Strategies  Clarita Crane has family support. client was admitted yesterday to [facility]. She will receive Hospice Services in her home environment.]  Pharmacy Interventions   Pharmacy Dicussed/Reviewed Pharmacy Topics Discussed  Safety Interventions   Safety Discussed/Reviewed Fall Risk        Follow up plan: No follow up call needed. Client is receiving Hospice care support in the home environment   Encounter Outcome:  Pt. Visit Completed   Kelton Pillar.Aerik Polan MSW, LCSW Licensed Visual merchandiser [facility] Care Management [phone]

## 2022-12-26 NOTE — Patient Instructions (Signed)
Visit Information  Thank you for taking time to visit with me today. Please don't hesitate to contact me if I can be of assistance to you.   Following are the goals we discussed today:   Goals Addressed             This Visit's Progress    Patient was admitted to Hospice services yesterday. Patient will receive Hospice services in home environment       Interventions: LCSW spoke via phone with Bronson Curb, relative and contact for client, about client needs Discussed program support Bronson Curb said client was admitted yesterday to [facility]. Client will remain at her home and will receive Hospice services in the home environment Discussed Hospice support  for client with [place]. Wilkie Aye said that Hospice had sent out oxygen equipment for client today.  She said she is waiting on call from Chaplain and Nurse from [facility] Client has family support.   Discussed pain issues of client. Wilkie Aye said pain issues were manageable for client; but she said client breathing is challenging. She said client has been on oxygen at 4 liters for the past year.   Informed Wilkie Aye that Hospice will be providing care for client moving forward. Discussed DME support of Hospice. Discussed in home care support through Hospice.   Ardelia Mems for phone call today with LCSW . Encouraged client and family to work with Hospice agency as they provide in home care for client . Bronson Curb was appreciative of call from LCSW today       No follow up call needed. Client is receiving Hospice care support in the home environment  Please call the care guide team at [phone] if you need to cancel or reschedule your appointment.   If you are experiencing a Mental Health or Behavioral Health Crisis or need someone to talk to, please go to [facility] [address] [place] [phone])   The patient / Bronson Curb, relative and contact, verbalized understanding  of instructions, educational materials, and care plan provided today and DECLINED offer to receive copy of patient instructions, educational materials, and care plan.   The patient / Bronson Curb, relative and contact, has been provided with contact information for the care management team and has been advised to call with any health related questions or concerns.   Kelton Pillar.Lizbett Garciagarcia MSW, LCSW Licensed Visual merchandiser [facility] Care Management [phone]

## 2022-12-27 ENCOUNTER — Telehealth: Payer: Self-pay | Admitting: Medical Oncology

## 2022-12-27 NOTE — Telephone Encounter (Signed)
Pt and dtr in law called to cancel her appts . She is under Hospice care. Laurelin told me :" I have come to terms with my illness ". She said to thank Dr. Arbutus Ped for his care.

## 2023-02-02 ENCOUNTER — Other Ambulatory Visit: Payer: Medicare Other

## 2023-02-02 ENCOUNTER — Other Ambulatory Visit (HOSPITAL_COMMUNITY): Payer: Medicare Other

## 2023-02-06 ENCOUNTER — Ambulatory Visit: Payer: Medicare Other | Admitting: Internal Medicine

## 2023-02-07 ENCOUNTER — Ambulatory Visit: Payer: Medicare Other | Admitting: Internal Medicine

## 2023-02-08 ENCOUNTER — Ambulatory Visit: Payer: Medicare Other | Admitting: Internal Medicine

## 2023-03-01 ENCOUNTER — Ambulatory Visit: Payer: Medicare Other | Admitting: Pulmonary Disease

## 2023-03-09 ENCOUNTER — Other Ambulatory Visit: Payer: Self-pay | Admitting: Internal Medicine

## 2023-03-18 DEATH — deceased

## 2023-05-02 NOTE — Telephone Encounter (Signed)
TC
